# Patient Record
Sex: Male | Born: 1950 | Race: White | Hispanic: No | Marital: Single | Smoking: Current some day smoker
Health system: Southern US, Community
[De-identification: ages and names within clinical notes are randomized; demographics above are authoritative.]

## PROBLEM LIST (undated history)

## (undated) DIAGNOSIS — R002 Palpitations: Secondary | ICD-10-CM

## (undated) DIAGNOSIS — I341 Nonrheumatic mitral (valve) prolapse: Secondary | ICD-10-CM

## (undated) DIAGNOSIS — I209 Angina pectoris, unspecified: Secondary | ICD-10-CM

## (undated) DIAGNOSIS — K209 Esophagitis, unspecified without bleeding: Secondary | ICD-10-CM

## (undated) DIAGNOSIS — J189 Pneumonia, unspecified organism: Secondary | ICD-10-CM

## (undated) DIAGNOSIS — I2510 Atherosclerotic heart disease of native coronary artery without angina pectoris: Secondary | ICD-10-CM

## (undated) DIAGNOSIS — K2970 Gastritis, unspecified, without bleeding: Secondary | ICD-10-CM

## (undated) DIAGNOSIS — F319 Bipolar disorder, unspecified: Secondary | ICD-10-CM

## (undated) DIAGNOSIS — F329 Major depressive disorder, single episode, unspecified: Secondary | ICD-10-CM

## (undated) DIAGNOSIS — R569 Unspecified convulsions: Secondary | ICD-10-CM

## (undated) DIAGNOSIS — I10 Essential (primary) hypertension: Secondary | ICD-10-CM

## (undated) DIAGNOSIS — E7800 Pure hypercholesterolemia, unspecified: Secondary | ICD-10-CM

## (undated) DIAGNOSIS — E785 Hyperlipidemia, unspecified: Secondary | ICD-10-CM

## (undated) DIAGNOSIS — F419 Anxiety disorder, unspecified: Secondary | ICD-10-CM

## (undated) DIAGNOSIS — Z9289 Personal history of other medical treatment: Secondary | ICD-10-CM

## (undated) DIAGNOSIS — K219 Gastro-esophageal reflux disease without esophagitis: Secondary | ICD-10-CM

## (undated) DIAGNOSIS — G4700 Insomnia, unspecified: Secondary | ICD-10-CM

## (undated) DIAGNOSIS — F32A Depression, unspecified: Secondary | ICD-10-CM

## (undated) DIAGNOSIS — M1990 Unspecified osteoarthritis, unspecified site: Secondary | ICD-10-CM

## (undated) HISTORY — DX: Major depressive disorder, single episode, unspecified: F32.9

## (undated) HISTORY — DX: Insomnia, unspecified: G47.00

## (undated) HISTORY — PX: HIP ARTHROPLASTY: SHX981

## (undated) HISTORY — DX: Atherosclerotic heart disease of native coronary artery without angina pectoris: I25.10

## (undated) HISTORY — PX: TOTAL KNEE ARTHROPLASTY: SHX125

## (undated) HISTORY — DX: Esophagitis, unspecified without bleeding: K20.90

## (undated) HISTORY — DX: Anxiety disorder, unspecified: F41.9

## (undated) HISTORY — DX: Palpitations: R00.2

## (undated) HISTORY — DX: Nonrheumatic mitral (valve) prolapse: I34.1

## (undated) HISTORY — DX: Hyperlipidemia, unspecified: E78.5

## (undated) HISTORY — DX: Gastritis, unspecified, without bleeding: K29.70

## (undated) HISTORY — DX: Essential (primary) hypertension: I10

## (undated) HISTORY — DX: Esophagitis, unspecified: K20.9

## (undated) HISTORY — DX: Depression, unspecified: F32.A

## (undated) HISTORY — PX: KNEE ARTHROSCOPY, MEDIAL PATELLO FEMORAL LIGAMENT REPAIR: SHX1890

## (undated) HISTORY — DX: Bipolar disorder, unspecified: F31.9

## (undated) HISTORY — PX: TOTAL SHOULDER REPLACEMENT: SUR1217

## (undated) HISTORY — PX: JOINT REPLACEMENT: SHX530

## (undated) HISTORY — DX: Personal history of other medical treatment: Z92.89

---
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
## 1997-11-06 ENCOUNTER — Encounter: Admission: RE | Admit: 1997-11-06 | Discharge: 1997-11-06 | Payer: Self-pay | Admitting: Sports Medicine

## 1998-01-04 ENCOUNTER — Encounter: Admission: RE | Admit: 1998-01-04 | Discharge: 1998-01-04 | Payer: Self-pay | Admitting: Family Medicine

## 2000-07-19 ENCOUNTER — Emergency Department (HOSPITAL_COMMUNITY): Admission: EM | Admit: 2000-07-19 | Discharge: 2000-07-19 | Payer: Self-pay | Admitting: Emergency Medicine

## 2000-08-17 ENCOUNTER — Emergency Department (HOSPITAL_COMMUNITY): Admission: EM | Admit: 2000-08-17 | Discharge: 2000-08-17 | Payer: Self-pay | Admitting: Emergency Medicine

## 2000-08-17 ENCOUNTER — Encounter: Payer: Self-pay | Admitting: Internal Medicine

## 2001-07-19 ENCOUNTER — Inpatient Hospital Stay (HOSPITAL_COMMUNITY): Admission: EM | Admit: 2001-07-19 | Discharge: 2001-07-19 | Payer: Self-pay | Admitting: *Deleted

## 2001-07-19 ENCOUNTER — Encounter: Payer: Self-pay | Admitting: Cardiology

## 2001-08-08 ENCOUNTER — Encounter (INDEPENDENT_AMBULATORY_CARE_PROVIDER_SITE_OTHER): Payer: Self-pay | Admitting: *Deleted

## 2001-08-08 ENCOUNTER — Encounter: Payer: Self-pay | Admitting: Emergency Medicine

## 2001-08-08 ENCOUNTER — Inpatient Hospital Stay (HOSPITAL_COMMUNITY): Admission: EM | Admit: 2001-08-08 | Discharge: 2001-08-10 | Payer: Self-pay | Admitting: Emergency Medicine

## 2001-08-09 ENCOUNTER — Encounter: Payer: Self-pay | Admitting: Emergency Medicine

## 2001-08-13 ENCOUNTER — Encounter: Payer: Self-pay | Admitting: *Deleted

## 2001-08-13 ENCOUNTER — Emergency Department (HOSPITAL_COMMUNITY): Admission: EM | Admit: 2001-08-13 | Discharge: 2001-08-14 | Payer: Self-pay | Admitting: *Deleted

## 2001-08-14 ENCOUNTER — Emergency Department (HOSPITAL_COMMUNITY): Admission: EM | Admit: 2001-08-14 | Discharge: 2001-08-14 | Payer: Self-pay

## 2001-08-14 ENCOUNTER — Emergency Department (HOSPITAL_COMMUNITY): Admission: EM | Admit: 2001-08-14 | Discharge: 2001-08-14 | Payer: Self-pay | Admitting: Emergency Medicine

## 2001-09-08 ENCOUNTER — Emergency Department (HOSPITAL_COMMUNITY): Admission: EM | Admit: 2001-09-08 | Discharge: 2001-09-09 | Payer: Self-pay | Admitting: Emergency Medicine

## 2001-09-08 ENCOUNTER — Encounter: Payer: Self-pay | Admitting: Emergency Medicine

## 2001-09-09 ENCOUNTER — Emergency Department (HOSPITAL_COMMUNITY): Admission: EM | Admit: 2001-09-09 | Discharge: 2001-09-09 | Payer: Self-pay | Admitting: Emergency Medicine

## 2001-09-09 ENCOUNTER — Encounter: Payer: Self-pay | Admitting: Emergency Medicine

## 2001-09-10 ENCOUNTER — Inpatient Hospital Stay (HOSPITAL_COMMUNITY): Admission: EM | Admit: 2001-09-10 | Discharge: 2001-09-11 | Payer: Self-pay | Admitting: Emergency Medicine

## 2001-09-10 ENCOUNTER — Encounter: Payer: Self-pay | Admitting: Emergency Medicine

## 2001-09-24 ENCOUNTER — Emergency Department (HOSPITAL_COMMUNITY): Admission: EM | Admit: 2001-09-24 | Discharge: 2001-09-24 | Payer: Self-pay | Admitting: Emergency Medicine

## 2001-09-24 ENCOUNTER — Encounter: Payer: Self-pay | Admitting: Emergency Medicine

## 2001-11-05 ENCOUNTER — Encounter: Payer: Self-pay | Admitting: Orthopedic Surgery

## 2001-11-05 ENCOUNTER — Inpatient Hospital Stay (HOSPITAL_COMMUNITY): Admission: RE | Admit: 2001-11-05 | Discharge: 2001-11-07 | Payer: Self-pay | Admitting: Orthopedic Surgery

## 2001-11-07 ENCOUNTER — Inpatient Hospital Stay (HOSPITAL_COMMUNITY)
Admission: RE | Admit: 2001-11-07 | Discharge: 2001-11-13 | Payer: Self-pay | Admitting: Physical Medicine & Rehabilitation

## 2001-12-25 ENCOUNTER — Inpatient Hospital Stay (HOSPITAL_COMMUNITY): Admission: EM | Admit: 2001-12-25 | Discharge: 2001-12-25 | Payer: Self-pay | Admitting: Emergency Medicine

## 2001-12-25 ENCOUNTER — Encounter: Payer: Self-pay | Admitting: Orthopaedic Surgery

## 2001-12-25 ENCOUNTER — Encounter: Payer: Self-pay | Admitting: Emergency Medicine

## 2002-01-19 ENCOUNTER — Encounter: Payer: Self-pay | Admitting: Family Medicine

## 2002-01-19 ENCOUNTER — Inpatient Hospital Stay (HOSPITAL_COMMUNITY): Admission: EM | Admit: 2002-01-19 | Discharge: 2002-01-22 | Payer: Self-pay | Admitting: Emergency Medicine

## 2002-02-04 ENCOUNTER — Encounter: Payer: Self-pay | Admitting: Emergency Medicine

## 2002-02-04 ENCOUNTER — Emergency Department (HOSPITAL_COMMUNITY): Admission: EM | Admit: 2002-02-04 | Discharge: 2002-02-05 | Payer: Self-pay | Admitting: Emergency Medicine

## 2002-02-06 ENCOUNTER — Encounter: Payer: Self-pay | Admitting: Emergency Medicine

## 2002-02-06 ENCOUNTER — Inpatient Hospital Stay (HOSPITAL_COMMUNITY): Admission: EM | Admit: 2002-02-06 | Discharge: 2002-02-07 | Payer: Self-pay | Admitting: Emergency Medicine

## 2002-02-09 ENCOUNTER — Encounter: Payer: Self-pay | Admitting: Emergency Medicine

## 2002-02-09 ENCOUNTER — Emergency Department (HOSPITAL_COMMUNITY): Admission: EM | Admit: 2002-02-09 | Discharge: 2002-02-09 | Payer: Self-pay | Admitting: Emergency Medicine

## 2002-02-10 ENCOUNTER — Emergency Department (HOSPITAL_COMMUNITY): Admission: EM | Admit: 2002-02-10 | Discharge: 2002-02-10 | Payer: Self-pay | Admitting: Emergency Medicine

## 2002-02-10 ENCOUNTER — Encounter: Payer: Self-pay | Admitting: Emergency Medicine

## 2002-02-18 ENCOUNTER — Encounter: Payer: Self-pay | Admitting: Emergency Medicine

## 2002-02-18 ENCOUNTER — Emergency Department (HOSPITAL_COMMUNITY): Admission: EM | Admit: 2002-02-18 | Discharge: 2002-02-18 | Payer: Self-pay | Admitting: Emergency Medicine

## 2002-03-13 ENCOUNTER — Emergency Department (HOSPITAL_COMMUNITY): Admission: EM | Admit: 2002-03-13 | Discharge: 2002-03-14 | Payer: Self-pay | Admitting: Emergency Medicine

## 2002-03-13 ENCOUNTER — Encounter: Payer: Self-pay | Admitting: Emergency Medicine

## 2002-03-15 ENCOUNTER — Emergency Department (HOSPITAL_COMMUNITY): Admission: EM | Admit: 2002-03-15 | Discharge: 2002-03-15 | Payer: Self-pay | Admitting: Emergency Medicine

## 2002-03-16 ENCOUNTER — Emergency Department (HOSPITAL_COMMUNITY): Admission: EM | Admit: 2002-03-16 | Discharge: 2002-03-16 | Payer: Self-pay | Admitting: Emergency Medicine

## 2002-03-22 ENCOUNTER — Emergency Department (HOSPITAL_COMMUNITY): Admission: EM | Admit: 2002-03-22 | Discharge: 2002-03-22 | Payer: Self-pay | Admitting: Emergency Medicine

## 2002-05-27 ENCOUNTER — Emergency Department (HOSPITAL_COMMUNITY): Admission: EM | Admit: 2002-05-27 | Discharge: 2002-05-27 | Payer: Self-pay | Admitting: Emergency Medicine

## 2002-07-07 ENCOUNTER — Emergency Department (HOSPITAL_COMMUNITY): Admission: EM | Admit: 2002-07-07 | Discharge: 2002-07-07 | Payer: Self-pay | Admitting: Emergency Medicine

## 2002-07-07 ENCOUNTER — Encounter: Payer: Self-pay | Admitting: Emergency Medicine

## 2002-07-28 ENCOUNTER — Emergency Department (HOSPITAL_COMMUNITY): Admission: EM | Admit: 2002-07-28 | Discharge: 2002-07-28 | Payer: Self-pay | Admitting: Emergency Medicine

## 2002-07-28 ENCOUNTER — Encounter: Payer: Self-pay | Admitting: Emergency Medicine

## 2002-08-05 ENCOUNTER — Emergency Department (HOSPITAL_COMMUNITY): Admission: EM | Admit: 2002-08-05 | Discharge: 2002-08-05 | Payer: Self-pay | Admitting: Emergency Medicine

## 2002-10-19 ENCOUNTER — Emergency Department (HOSPITAL_COMMUNITY): Admission: EM | Admit: 2002-10-19 | Discharge: 2002-10-19 | Payer: Self-pay | Admitting: Emergency Medicine

## 2002-11-21 ENCOUNTER — Inpatient Hospital Stay (HOSPITAL_COMMUNITY): Admission: EM | Admit: 2002-11-21 | Discharge: 2002-12-22 | Payer: Self-pay | Admitting: Emergency Medicine

## 2002-11-23 ENCOUNTER — Encounter: Payer: Self-pay | Admitting: Nephrology

## 2002-11-25 ENCOUNTER — Encounter (INDEPENDENT_AMBULATORY_CARE_PROVIDER_SITE_OTHER): Payer: Self-pay | Admitting: Specialist

## 2002-11-26 ENCOUNTER — Encounter (INDEPENDENT_AMBULATORY_CARE_PROVIDER_SITE_OTHER): Payer: Self-pay | Admitting: *Deleted

## 2002-12-08 ENCOUNTER — Encounter: Payer: Self-pay | Admitting: Internal Medicine

## 2002-12-22 ENCOUNTER — Inpatient Hospital Stay (HOSPITAL_COMMUNITY)
Admission: RE | Admit: 2002-12-22 | Discharge: 2003-01-02 | Payer: Self-pay | Admitting: Physical Medicine & Rehabilitation

## 2002-12-25 ENCOUNTER — Encounter (INDEPENDENT_AMBULATORY_CARE_PROVIDER_SITE_OTHER): Payer: Self-pay | Admitting: Cardiology

## 2003-01-24 DIAGNOSIS — J189 Pneumonia, unspecified organism: Secondary | ICD-10-CM

## 2003-01-24 HISTORY — DX: Pneumonia, unspecified organism: J18.9

## 2003-02-05 ENCOUNTER — Emergency Department (HOSPITAL_COMMUNITY): Admission: EM | Admit: 2003-02-05 | Discharge: 2003-02-05 | Payer: Self-pay | Admitting: Emergency Medicine

## 2003-02-27 ENCOUNTER — Ambulatory Visit: Admission: RE | Admit: 2003-02-27 | Discharge: 2003-03-03 | Payer: Self-pay | Admitting: Orthopedic Surgery

## 2003-03-25 ENCOUNTER — Ambulatory Visit (HOSPITAL_COMMUNITY): Admission: RE | Admit: 2003-03-25 | Discharge: 2003-03-25 | Payer: Self-pay | Admitting: Orthopedic Surgery

## 2003-04-17 ENCOUNTER — Inpatient Hospital Stay (HOSPITAL_COMMUNITY): Admission: RE | Admit: 2003-04-17 | Discharge: 2003-04-23 | Payer: Self-pay | Admitting: Psychiatry

## 2003-05-17 ENCOUNTER — Emergency Department (HOSPITAL_COMMUNITY): Admission: EM | Admit: 2003-05-17 | Discharge: 2003-05-17 | Payer: Self-pay | Admitting: Emergency Medicine

## 2003-06-19 ENCOUNTER — Emergency Department (HOSPITAL_COMMUNITY): Admission: EM | Admit: 2003-06-19 | Discharge: 2003-06-20 | Payer: Self-pay | Admitting: Emergency Medicine

## 2003-06-29 ENCOUNTER — Emergency Department (HOSPITAL_COMMUNITY): Admission: EM | Admit: 2003-06-29 | Discharge: 2003-06-29 | Payer: Self-pay | Admitting: Emergency Medicine

## 2003-07-10 ENCOUNTER — Emergency Department (HOSPITAL_COMMUNITY): Admission: EM | Admit: 2003-07-10 | Discharge: 2003-07-11 | Payer: Self-pay | Admitting: Emergency Medicine

## 2003-07-20 ENCOUNTER — Emergency Department (HOSPITAL_COMMUNITY): Admission: EM | Admit: 2003-07-20 | Discharge: 2003-07-20 | Payer: Self-pay | Admitting: Emergency Medicine

## 2003-08-17 ENCOUNTER — Emergency Department (HOSPITAL_COMMUNITY): Admission: EM | Admit: 2003-08-17 | Discharge: 2003-08-17 | Payer: Self-pay | Admitting: Emergency Medicine

## 2003-10-27 ENCOUNTER — Emergency Department (HOSPITAL_COMMUNITY): Admission: EM | Admit: 2003-10-27 | Discharge: 2003-10-27 | Payer: Self-pay | Admitting: Emergency Medicine

## 2003-11-05 ENCOUNTER — Ambulatory Visit: Payer: Self-pay | Admitting: Psychiatry

## 2003-11-05 ENCOUNTER — Inpatient Hospital Stay (HOSPITAL_COMMUNITY): Admission: RE | Admit: 2003-11-05 | Discharge: 2003-11-09 | Payer: Self-pay | Admitting: Psychiatry

## 2003-12-03 ENCOUNTER — Emergency Department (HOSPITAL_COMMUNITY): Admission: EM | Admit: 2003-12-03 | Discharge: 2003-12-03 | Payer: Self-pay | Admitting: Emergency Medicine

## 2003-12-03 ENCOUNTER — Emergency Department (HOSPITAL_COMMUNITY): Admission: EM | Admit: 2003-12-03 | Discharge: 2003-12-04 | Payer: Self-pay | Admitting: Emergency Medicine

## 2003-12-22 ENCOUNTER — Inpatient Hospital Stay (HOSPITAL_COMMUNITY): Admission: RE | Admit: 2003-12-22 | Discharge: 2003-12-27 | Payer: Self-pay | Admitting: Psychiatry

## 2003-12-22 ENCOUNTER — Ambulatory Visit: Payer: Self-pay | Admitting: Psychiatry

## 2003-12-31 ENCOUNTER — Emergency Department (HOSPITAL_COMMUNITY): Admission: EM | Admit: 2003-12-31 | Discharge: 2003-12-31 | Payer: Self-pay | Admitting: Emergency Medicine

## 2004-02-08 ENCOUNTER — Emergency Department (HOSPITAL_COMMUNITY): Admission: EM | Admit: 2004-02-08 | Discharge: 2004-02-08 | Payer: Self-pay | Admitting: Emergency Medicine

## 2004-02-11 ENCOUNTER — Inpatient Hospital Stay (HOSPITAL_COMMUNITY): Admission: RE | Admit: 2004-02-11 | Discharge: 2004-02-18 | Payer: Self-pay | Admitting: Psychiatry

## 2004-02-11 ENCOUNTER — Ambulatory Visit: Payer: Self-pay | Admitting: Psychiatry

## 2004-02-24 ENCOUNTER — Emergency Department (HOSPITAL_COMMUNITY): Admission: EM | Admit: 2004-02-24 | Discharge: 2004-02-24 | Payer: Self-pay | Admitting: Emergency Medicine

## 2004-02-26 ENCOUNTER — Inpatient Hospital Stay (HOSPITAL_COMMUNITY): Admission: EM | Admit: 2004-02-26 | Discharge: 2004-02-29 | Payer: Self-pay | Admitting: Emergency Medicine

## 2004-03-08 ENCOUNTER — Emergency Department (HOSPITAL_COMMUNITY): Admission: EM | Admit: 2004-03-08 | Discharge: 2004-03-08 | Payer: Self-pay | Admitting: Emergency Medicine

## 2004-03-09 ENCOUNTER — Ambulatory Visit: Payer: Self-pay | Admitting: Psychiatry

## 2004-03-09 ENCOUNTER — Inpatient Hospital Stay (HOSPITAL_COMMUNITY): Admission: RE | Admit: 2004-03-09 | Discharge: 2004-03-14 | Payer: Self-pay | Admitting: Psychiatry

## 2004-03-21 ENCOUNTER — Emergency Department (HOSPITAL_COMMUNITY): Admission: EM | Admit: 2004-03-21 | Discharge: 2004-03-22 | Payer: Self-pay | Admitting: Emergency Medicine

## 2004-03-30 ENCOUNTER — Inpatient Hospital Stay (HOSPITAL_COMMUNITY): Admission: EM | Admit: 2004-03-30 | Discharge: 2004-04-06 | Payer: Self-pay | Admitting: Emergency Medicine

## 2004-04-16 ENCOUNTER — Emergency Department (HOSPITAL_COMMUNITY): Admission: EM | Admit: 2004-04-16 | Discharge: 2004-04-16 | Payer: Self-pay | Admitting: Emergency Medicine

## 2005-01-23 HISTORY — PX: CORONARY ANGIOPLASTY: SHX604

## 2005-03-08 ENCOUNTER — Inpatient Hospital Stay (HOSPITAL_COMMUNITY): Admission: RE | Admit: 2005-03-08 | Discharge: 2005-03-10 | Payer: Self-pay | Admitting: Orthopedic Surgery

## 2005-05-04 ENCOUNTER — Encounter: Admission: RE | Admit: 2005-05-04 | Discharge: 2005-06-14 | Payer: Self-pay | Admitting: Orthopedic Surgery

## 2005-08-13 ENCOUNTER — Emergency Department (HOSPITAL_COMMUNITY): Admission: EM | Admit: 2005-08-13 | Discharge: 2005-08-13 | Payer: Self-pay | Admitting: Emergency Medicine

## 2005-08-16 ENCOUNTER — Emergency Department (HOSPITAL_COMMUNITY): Admission: EM | Admit: 2005-08-16 | Discharge: 2005-08-16 | Payer: Self-pay | Admitting: Emergency Medicine

## 2005-08-29 ENCOUNTER — Inpatient Hospital Stay (HOSPITAL_COMMUNITY): Admission: EM | Admit: 2005-08-29 | Discharge: 2005-09-01 | Payer: Self-pay | Admitting: Emergency Medicine

## 2005-08-30 ENCOUNTER — Encounter: Payer: Self-pay | Admitting: Cardiology

## 2005-09-11 ENCOUNTER — Emergency Department (HOSPITAL_COMMUNITY): Admission: EM | Admit: 2005-09-11 | Discharge: 2005-09-11 | Payer: Self-pay | Admitting: Emergency Medicine

## 2005-09-22 ENCOUNTER — Inpatient Hospital Stay (HOSPITAL_COMMUNITY): Admission: RE | Admit: 2005-09-22 | Discharge: 2005-09-30 | Payer: Self-pay | Admitting: *Deleted

## 2005-09-22 ENCOUNTER — Ambulatory Visit: Payer: Self-pay | Admitting: *Deleted

## 2005-10-03 ENCOUNTER — Emergency Department (HOSPITAL_COMMUNITY): Admission: EM | Admit: 2005-10-03 | Discharge: 2005-10-04 | Payer: Self-pay | Admitting: Emergency Medicine

## 2005-10-09 ENCOUNTER — Emergency Department (HOSPITAL_COMMUNITY): Admission: EM | Admit: 2005-10-09 | Discharge: 2005-10-09 | Payer: Self-pay | Admitting: Emergency Medicine

## 2005-10-29 ENCOUNTER — Emergency Department (HOSPITAL_COMMUNITY): Admission: EM | Admit: 2005-10-29 | Discharge: 2005-10-30 | Payer: Self-pay | Admitting: Emergency Medicine

## 2005-11-08 ENCOUNTER — Inpatient Hospital Stay (HOSPITAL_COMMUNITY): Admission: EM | Admit: 2005-11-08 | Discharge: 2005-11-10 | Payer: Self-pay | Admitting: Emergency Medicine

## 2005-11-25 ENCOUNTER — Encounter: Payer: Self-pay | Admitting: *Deleted

## 2005-11-25 ENCOUNTER — Inpatient Hospital Stay (HOSPITAL_COMMUNITY): Admission: AD | Admit: 2005-11-25 | Discharge: 2005-11-27 | Payer: Self-pay | Admitting: Interventional Cardiology

## 2005-12-04 ENCOUNTER — Encounter: Payer: Self-pay | Admitting: Emergency Medicine

## 2005-12-04 ENCOUNTER — Inpatient Hospital Stay (HOSPITAL_COMMUNITY): Admission: AD | Admit: 2005-12-04 | Discharge: 2005-12-05 | Payer: Self-pay | Admitting: Cardiology

## 2005-12-10 ENCOUNTER — Inpatient Hospital Stay (HOSPITAL_COMMUNITY): Admission: AD | Admit: 2005-12-10 | Discharge: 2005-12-13 | Payer: Self-pay | Admitting: Cardiology

## 2006-01-24 ENCOUNTER — Encounter: Payer: Self-pay | Admitting: Emergency Medicine

## 2006-01-25 ENCOUNTER — Inpatient Hospital Stay (HOSPITAL_COMMUNITY): Admission: EM | Admit: 2006-01-25 | Discharge: 2006-01-26 | Payer: Self-pay | Admitting: Emergency Medicine

## 2006-02-16 ENCOUNTER — Encounter: Payer: Self-pay | Admitting: Emergency Medicine

## 2006-02-16 ENCOUNTER — Inpatient Hospital Stay (HOSPITAL_COMMUNITY): Admission: EM | Admit: 2006-02-16 | Discharge: 2006-02-19 | Payer: Self-pay | Admitting: Cardiology

## 2006-04-07 ENCOUNTER — Emergency Department (HOSPITAL_COMMUNITY): Admission: EM | Admit: 2006-04-07 | Discharge: 2006-04-07 | Payer: Self-pay | Admitting: Emergency Medicine

## 2006-06-23 ENCOUNTER — Inpatient Hospital Stay (HOSPITAL_COMMUNITY): Admission: AD | Admit: 2006-06-23 | Discharge: 2006-06-27 | Payer: Self-pay | Admitting: *Deleted

## 2006-06-23 ENCOUNTER — Ambulatory Visit: Payer: Self-pay | Admitting: *Deleted

## 2006-07-03 ENCOUNTER — Emergency Department (HOSPITAL_COMMUNITY): Admission: EM | Admit: 2006-07-03 | Discharge: 2006-07-03 | Payer: Self-pay | Admitting: Emergency Medicine

## 2006-07-16 ENCOUNTER — Emergency Department (HOSPITAL_COMMUNITY): Admission: EM | Admit: 2006-07-16 | Discharge: 2006-07-16 | Payer: Self-pay | Admitting: Emergency Medicine

## 2006-07-18 ENCOUNTER — Emergency Department (HOSPITAL_COMMUNITY): Admission: EM | Admit: 2006-07-18 | Discharge: 2006-07-18 | Payer: Self-pay | Admitting: Emergency Medicine

## 2006-07-19 ENCOUNTER — Emergency Department (HOSPITAL_COMMUNITY): Admission: EM | Admit: 2006-07-19 | Discharge: 2006-07-19 | Payer: Self-pay | Admitting: Emergency Medicine

## 2006-07-20 ENCOUNTER — Emergency Department (HOSPITAL_COMMUNITY): Admission: EM | Admit: 2006-07-20 | Discharge: 2006-07-20 | Payer: Self-pay | Admitting: Emergency Medicine

## 2006-09-27 ENCOUNTER — Emergency Department (HOSPITAL_COMMUNITY): Admission: EM | Admit: 2006-09-27 | Discharge: 2006-09-27 | Payer: Self-pay | Admitting: Emergency Medicine

## 2006-09-29 ENCOUNTER — Inpatient Hospital Stay (HOSPITAL_COMMUNITY): Admission: AD | Admit: 2006-09-29 | Discharge: 2006-10-04 | Payer: Self-pay | Admitting: Psychiatry

## 2006-09-29 ENCOUNTER — Encounter: Payer: Self-pay | Admitting: Emergency Medicine

## 2006-10-01 ENCOUNTER — Ambulatory Visit: Payer: Self-pay | Admitting: Psychiatry

## 2006-10-19 ENCOUNTER — Inpatient Hospital Stay (HOSPITAL_COMMUNITY): Admission: EM | Admit: 2006-10-19 | Discharge: 2006-10-23 | Payer: Self-pay | Admitting: Emergency Medicine

## 2006-11-16 ENCOUNTER — Emergency Department (HOSPITAL_COMMUNITY): Admission: EM | Admit: 2006-11-16 | Discharge: 2006-11-16 | Payer: Self-pay | Admitting: Emergency Medicine

## 2006-12-05 ENCOUNTER — Encounter: Payer: Self-pay | Admitting: Emergency Medicine

## 2006-12-06 ENCOUNTER — Inpatient Hospital Stay (HOSPITAL_COMMUNITY): Admission: AD | Admit: 2006-12-06 | Discharge: 2006-12-10 | Payer: Self-pay | Admitting: Psychiatry

## 2006-12-06 ENCOUNTER — Ambulatory Visit: Payer: Self-pay | Admitting: Psychiatry

## 2006-12-06 ENCOUNTER — Encounter: Payer: Self-pay | Admitting: Emergency Medicine

## 2006-12-25 ENCOUNTER — Ambulatory Visit: Admission: RE | Admit: 2006-12-25 | Discharge: 2006-12-25 | Payer: Self-pay | Admitting: Orthopedic Surgery

## 2007-02-04 ENCOUNTER — Inpatient Hospital Stay (HOSPITAL_COMMUNITY): Admission: RE | Admit: 2007-02-04 | Discharge: 2007-02-08 | Payer: Self-pay | Admitting: Orthopedic Surgery

## 2007-12-13 ENCOUNTER — Encounter: Payer: Self-pay | Admitting: Emergency Medicine

## 2007-12-14 ENCOUNTER — Ambulatory Visit: Payer: Self-pay | Admitting: *Deleted

## 2007-12-14 ENCOUNTER — Inpatient Hospital Stay (HOSPITAL_COMMUNITY): Admission: EM | Admit: 2007-12-14 | Discharge: 2007-12-17 | Payer: Self-pay | Admitting: *Deleted

## 2008-04-23 HISTORY — PX: CARDIAC CATHETERIZATION: SHX172

## 2008-04-25 ENCOUNTER — Inpatient Hospital Stay (HOSPITAL_COMMUNITY): Admission: EM | Admit: 2008-04-25 | Discharge: 2008-04-27 | Payer: Self-pay | Admitting: Emergency Medicine

## 2008-07-20 ENCOUNTER — Ambulatory Visit: Payer: Self-pay | Admitting: Cardiology

## 2008-07-20 ENCOUNTER — Inpatient Hospital Stay (HOSPITAL_COMMUNITY): Admission: EM | Admit: 2008-07-20 | Discharge: 2008-07-22 | Payer: Self-pay | Admitting: Emergency Medicine

## 2008-08-31 ENCOUNTER — Inpatient Hospital Stay (HOSPITAL_COMMUNITY): Admission: AD | Admit: 2008-08-31 | Discharge: 2008-09-07 | Payer: Self-pay | Admitting: Psychiatry

## 2008-08-31 ENCOUNTER — Ambulatory Visit: Payer: Self-pay | Admitting: Psychiatry

## 2008-08-31 ENCOUNTER — Encounter: Payer: Self-pay | Admitting: Emergency Medicine

## 2009-01-01 ENCOUNTER — Encounter: Payer: Self-pay | Admitting: Emergency Medicine

## 2009-01-02 ENCOUNTER — Ambulatory Visit: Payer: Self-pay | Admitting: Psychiatry

## 2009-01-02 ENCOUNTER — Inpatient Hospital Stay (HOSPITAL_COMMUNITY): Admission: AD | Admit: 2009-01-02 | Discharge: 2009-01-07 | Payer: Self-pay | Admitting: Psychiatry

## 2009-08-12 ENCOUNTER — Inpatient Hospital Stay (HOSPITAL_COMMUNITY)
Admission: EM | Admit: 2009-08-12 | Discharge: 2009-08-14 | Payer: Self-pay | Source: Home / Self Care | Admitting: Emergency Medicine

## 2009-08-12 ENCOUNTER — Ambulatory Visit: Payer: Self-pay | Admitting: Internal Medicine

## 2009-08-16 ENCOUNTER — Observation Stay (HOSPITAL_COMMUNITY): Admission: EM | Admit: 2009-08-16 | Discharge: 2009-08-17 | Payer: Self-pay | Admitting: Emergency Medicine

## 2009-08-16 ENCOUNTER — Encounter (INDEPENDENT_AMBULATORY_CARE_PROVIDER_SITE_OTHER): Payer: Self-pay | Admitting: Internal Medicine

## 2009-10-10 ENCOUNTER — Ambulatory Visit: Payer: Self-pay | Admitting: Psychiatry

## 2009-10-10 ENCOUNTER — Inpatient Hospital Stay (HOSPITAL_COMMUNITY): Admission: RE | Admit: 2009-10-10 | Discharge: 2009-10-15 | Payer: Self-pay | Admitting: Psychiatry

## 2009-10-10 ENCOUNTER — Encounter: Payer: Self-pay | Admitting: Emergency Medicine

## 2010-02-12 ENCOUNTER — Encounter: Payer: Self-pay | Admitting: *Deleted

## 2010-02-13 ENCOUNTER — Encounter: Payer: Self-pay | Admitting: Orthopedic Surgery

## 2010-04-07 LAB — BASIC METABOLIC PANEL
BUN: 10 mg/dL (ref 6–23)
Chloride: 101 mEq/L (ref 96–112)
Creatinine, Ser: 0.97 mg/dL (ref 0.4–1.5)

## 2010-04-07 LAB — DIFFERENTIAL
Basophils Absolute: 0 10*3/uL (ref 0.0–0.1)
Basophils Relative: 0 % (ref 0–1)
Eosinophils Absolute: 0.1 10*3/uL (ref 0.0–0.7)
Eosinophils Relative: 2 % (ref 0–5)
Lymphocytes Relative: 25 % (ref 12–46)

## 2010-04-07 LAB — HEPATIC FUNCTION PANEL
ALT: 78 U/L — ABNORMAL HIGH (ref 0–53)
AST: 75 U/L — ABNORMAL HIGH (ref 0–37)
Albumin: 4.8 g/dL (ref 3.5–5.2)
Alkaline Phosphatase: 73 U/L (ref 39–117)
Total Protein: 7.9 g/dL (ref 6.0–8.3)

## 2010-04-07 LAB — RAPID URINE DRUG SCREEN, HOSP PERFORMED
Barbiturates: NOT DETECTED
Benzodiazepines: NOT DETECTED

## 2010-04-07 LAB — ETHANOL: Alcohol, Ethyl (B): <5 mg/dL (ref 0–10)

## 2010-04-07 LAB — CBC
MCV: 88.8 fL (ref 78.0–100.0)
Platelets: 274 10*3/uL (ref 150–400)
RDW: 16.1 % — ABNORMAL HIGH (ref 11.5–15.5)
WBC: 7.6 10*3/uL (ref 4.0–10.5)

## 2010-04-09 LAB — POCT I-STAT, CHEM 8
BUN: <3 mg/dL — ABNORMAL LOW (ref 6–23)
BUN: <3 mg/dL — ABNORMAL LOW (ref 6–23)
Calcium, Ion: 0.99 mmol/L — ABNORMAL LOW (ref 1.12–1.32)
Chloride: 103 mEq/L (ref 96–112)
Creatinine, Ser: 1 mg/dL (ref 0.4–1.5)
Creatinine, Ser: 1.1 mg/dL (ref 0.4–1.5)
Glucose, Bld: 73 mg/dL (ref 70–99)
Glucose, Bld: 76 mg/dL (ref 70–99)
Hemoglobin: 15.3 g/dL (ref 13.0–17.0)
Potassium: 3.8 mEq/L (ref 3.5–5.1)
TCO2: 18 mmol/L (ref 0–100)

## 2010-04-09 LAB — DIFFERENTIAL
Basophils Absolute: 0 10*3/uL (ref 0.0–0.1)
Basophils Relative: 0 % (ref 0–1)
Eosinophils Absolute: 0.1 10*3/uL (ref 0.0–0.7)
Eosinophils Relative: 1 % (ref 0–5)
Lymphocytes Relative: 30 % (ref 12–46)
Lymphs Abs: 3.5 10*3/uL (ref 0.7–4.0)
Monocytes Absolute: 0.5 10*3/uL (ref 0.1–1.0)
Monocytes Relative: 4 % (ref 3–12)
Neutro Abs: 4.8 10*3/uL (ref 1.7–7.7)
Neutrophils Relative %: 59 % (ref 43–77)

## 2010-04-09 LAB — POCT CARDIAC MARKERS
CKMB, poc: 1.4 ng/mL (ref 1.0–8.0)
Myoglobin, poc: 110 ng/mL (ref 12–200)
Myoglobin, poc: 178 ng/mL (ref 12–200)
Troponin i, poc: <0.05 ng/mL (ref 0.00–0.09)

## 2010-04-09 LAB — BRAIN NATRIURETIC PEPTIDE: Pro B Natriuretic peptide (BNP): 299 pg/mL — ABNORMAL HIGH (ref 0.0–100.0)

## 2010-04-09 LAB — CBC
HCT: 40.1 % (ref 39.0–52.0)
HCT: 40.8 % (ref 39.0–52.0)
HCT: 40.9 % (ref 39.0–52.0)
Hemoglobin: 13.6 g/dL (ref 13.0–17.0)
Hemoglobin: 13.6 g/dL (ref 13.0–17.0)
MCH: 30 pg (ref 26.0–34.0)
MCHC: 33.3 g/dL (ref 30.0–36.0)
MCV: 88.6 fL (ref 78.0–100.0)
MCV: 88.7 fL (ref 78.0–100.0)
Platelets: 229 10*3/uL (ref 150–400)
RBC: 4.53 MIL/uL (ref 4.22–5.81)
RBC: 4.6 MIL/uL (ref 4.22–5.81)
RBC: 4.61 MIL/uL (ref 4.22–5.81)
WBC: 11.8 10*3/uL — ABNORMAL HIGH (ref 4.0–10.5)
WBC: 8.1 10*3/uL (ref 4.0–10.5)

## 2010-04-09 LAB — COMPREHENSIVE METABOLIC PANEL
Alkaline Phosphatase: 81 U/L (ref 39–117)
BUN: 8 mg/dL (ref 6–23)
Chloride: 99 mEq/L (ref 96–112)
GFR calc non Af Amer: >60 mL/min (ref >60)
Glucose, Bld: 108 mg/dL — ABNORMAL HIGH (ref 70–99)
Potassium: 3.8 mEq/L (ref 3.5–5.1)
Total Bilirubin: 0.7 mg/dL (ref 0.3–1.2)

## 2010-04-09 LAB — CARDIAC PANEL(CRET KIN+CKTOT+MB+TROPI)
CK, MB: 1.8 ng/mL (ref 0.3–4.0)
CK, MB: 2 ng/mL (ref 0.3–4.0)
Relative Index: 0.7 (ref 0.0–2.5)
Relative Index: 1 (ref 0.0–2.5)
Relative Index: 1 (ref 0.0–2.5)
Total CK: 189 U/L (ref 7–232)
Total CK: 296 U/L — ABNORMAL HIGH (ref 7–232)
Troponin I: 0.01 ng/mL (ref 0.00–0.06)
Troponin I: 0.01 ng/mL (ref 0.00–0.06)

## 2010-04-09 LAB — BASIC METABOLIC PANEL
CO2: 26 mEq/L (ref 19–32)
GFR calc Af Amer: >60 mL/min (ref >60)
GFR calc non Af Amer: >60 mL/min (ref >60)
Glucose, Bld: 102 mg/dL — ABNORMAL HIGH (ref 70–99)
Potassium: 3.8 mEq/L (ref 3.5–5.1)
Sodium: 138 mEq/L (ref 135–145)

## 2010-04-09 LAB — CK TOTAL AND CKMB (NOT AT ARMC)
CK, MB: 2.9 ng/mL (ref 0.3–4.0)
Total CK: 372 U/L — ABNORMAL HIGH (ref 7–232)

## 2010-04-09 LAB — FOLATE: Folate: >20 ng/mL

## 2010-04-09 LAB — LIPID PANEL
Total CHOL/HDL Ratio: 1.9 RATIO
VLDL: 13 mg/dL (ref 0–40)

## 2010-04-09 LAB — VITAMIN B12: Vitamin B-12: 580 pg/mL (ref 211–911)

## 2010-04-09 LAB — RPR: RPR Ser Ql: NONREACTIVE

## 2010-04-26 LAB — URINALYSIS, ROUTINE W REFLEX MICROSCOPIC
Bilirubin Urine: NEGATIVE
Hgb urine dipstick: NEGATIVE
Ketones, ur: NEGATIVE mg/dL
Protein, ur: NEGATIVE mg/dL
Urobilinogen, UA: 1 mg/dL (ref 0.0–1.0)

## 2010-04-26 LAB — DIFFERENTIAL
Basophils Relative: 1 % (ref 0–1)
Eosinophils Absolute: 0.1 10*3/uL (ref 0.0–0.7)
Monocytes Relative: 10 % (ref 3–12)
Neutrophils Relative %: 57 % (ref 43–77)

## 2010-04-26 LAB — RAPID URINE DRUG SCREEN, HOSP PERFORMED
Amphetamines: NOT DETECTED
Barbiturates: NOT DETECTED
Tetrahydrocannabinol: NOT DETECTED

## 2010-04-26 LAB — GLUCOSE, CAPILLARY

## 2010-04-26 LAB — POCT I-STAT, CHEM 8
Calcium, Ion: 1.09 mmol/L — ABNORMAL LOW (ref 1.12–1.32)
Creatinine, Ser: 1 mg/dL (ref 0.4–1.5)
Hemoglobin: 15 g/dL (ref 13.0–17.0)
Sodium: 138 mEq/L (ref 135–145)
TCO2: 23 mmol/L (ref 0–100)

## 2010-04-26 LAB — CBC
MCHC: 32.2 g/dL (ref 30.0–36.0)
MCV: 89.1 fL (ref 78.0–100.0)
Platelets: 253 10*3/uL (ref 150–400)
RBC: 4.47 MIL/uL (ref 4.22–5.81)

## 2010-04-30 LAB — GLUCOSE, CAPILLARY
Glucose-Capillary: 109 mg/dL — ABNORMAL HIGH (ref 70–99)
Glucose-Capillary: 114 mg/dL — ABNORMAL HIGH (ref 70–99)

## 2010-04-30 LAB — BASIC METABOLIC PANEL
BUN: 15 mg/dL (ref 6–23)
Chloride: 98 mEq/L (ref 96–112)
GFR calc non Af Amer: >60 mL/min (ref >60)
Potassium: 4.3 mEq/L (ref 3.5–5.1)
Sodium: 129 mEq/L — ABNORMAL LOW (ref 135–145)

## 2010-04-30 LAB — DIFFERENTIAL
Lymphs Abs: 3.9 10*3/uL (ref 0.7–4.0)
Monocytes Relative: 6 % (ref 3–12)
Neutro Abs: 7.9 10*3/uL — ABNORMAL HIGH (ref 1.7–7.7)
Neutrophils Relative %: 63 % (ref 43–77)

## 2010-04-30 LAB — ETHANOL
Alcohol, Ethyl (B): 203 mg/dL — ABNORMAL HIGH (ref 0–10)
Alcohol, Ethyl (B): 57 mg/dL — ABNORMAL HIGH (ref 0–10)

## 2010-04-30 LAB — CBC
Platelets: 320 10*3/uL (ref 150–400)
RBC: 5.76 MIL/uL (ref 4.22–5.81)
WBC: 12.7 10*3/uL — ABNORMAL HIGH (ref 4.0–10.5)

## 2010-04-30 LAB — POCT I-STAT, CHEM 8
Chloride: 96 mEq/L (ref 96–112)
Creatinine, Ser: 0.9 mg/dL (ref 0.4–1.5)
Glucose, Bld: 91 mg/dL (ref 70–99)
HCT: 53 % — ABNORMAL HIGH (ref 39.0–52.0)
Potassium: 4.6 mEq/L (ref 3.5–5.1)
Sodium: 124 mEq/L — ABNORMAL LOW (ref 135–145)

## 2010-04-30 LAB — POCT CARDIAC MARKERS
CKMB, poc: <1 ng/mL — ABNORMAL LOW (ref 1.0–8.0)
Myoglobin, poc: 84.4 ng/mL (ref 12–200)
Troponin i, poc: <0.05 ng/mL (ref 0.00–0.09)

## 2010-04-30 LAB — RAPID URINE DRUG SCREEN, HOSP PERFORMED
Cocaine: NOT DETECTED
Opiates: NOT DETECTED
Tetrahydrocannabinol: NOT DETECTED

## 2010-05-02 LAB — CBC
HCT: 36.8 % — ABNORMAL LOW (ref 39.0–52.0)
Hemoglobin: 12.3 g/dL — ABNORMAL LOW (ref 13.0–17.0)
MCHC: 33.1 g/dL (ref 30.0–36.0)
MCHC: 33.3 g/dL (ref 30.0–36.0)
MCV: 83.1 fL (ref 78.0–100.0)
Platelets: 202 10*3/uL (ref 150–400)
Platelets: 220 10*3/uL (ref 150–400)
RDW: 16.6 % — ABNORMAL HIGH (ref 11.5–15.5)
RDW: 16.9 % — ABNORMAL HIGH (ref 11.5–15.5)

## 2010-05-02 LAB — COMPREHENSIVE METABOLIC PANEL
Albumin: 3.7 g/dL (ref 3.5–5.2)
BUN: 7 mg/dL (ref 6–23)
Calcium: 8.9 mg/dL (ref 8.4–10.5)
Creatinine, Ser: 0.92 mg/dL (ref 0.4–1.5)
Glucose, Bld: 82 mg/dL (ref 70–99)
Potassium: 4.2 mEq/L (ref 3.5–5.1)
Total Protein: 6.8 g/dL (ref 6.0–8.3)

## 2010-05-02 LAB — URINALYSIS, ROUTINE W REFLEX MICROSCOPIC
Bilirubin Urine: NEGATIVE
Nitrite: NEGATIVE
Specific Gravity, Urine: 1.005 (ref 1.005–1.030)
Urobilinogen, UA: 0.2 mg/dL (ref 0.0–1.0)
pH: 6 (ref 5.0–8.0)

## 2010-05-02 LAB — CARDIAC PANEL(CRET KIN+CKTOT+MB+TROPI)
Relative Index: 1 (ref 0.0–2.5)
Troponin I: 0.02 ng/mL (ref 0.00–0.06)

## 2010-05-02 LAB — BASIC METABOLIC PANEL
BUN: 7 mg/dL (ref 6–23)
CO2: 21 mEq/L (ref 19–32)
Calcium: 9.2 mg/dL (ref 8.4–10.5)
Creatinine, Ser: 0.86 mg/dL (ref 0.4–1.5)
GFR calc non Af Amer: >60 mL/min (ref >60)
Glucose, Bld: 71 mg/dL (ref 70–99)
Sodium: 137 mEq/L (ref 135–145)

## 2010-05-02 LAB — RAPID URINE DRUG SCREEN, HOSP PERFORMED
Barbiturates: NOT DETECTED
Benzodiazepines: NOT DETECTED

## 2010-05-02 LAB — DIFFERENTIAL
Basophils Absolute: 0.1 10*3/uL (ref 0.0–0.1)
Basophils Relative: 1 % (ref 0–1)
Basophils Relative: 1 % (ref 0–1)
Lymphocytes Relative: 24 % (ref 12–46)
Lymphocytes Relative: 31 % (ref 12–46)
Lymphs Abs: 2.2 10*3/uL (ref 0.7–4.0)
Monocytes Absolute: 0.6 10*3/uL (ref 0.1–1.0)
Monocytes Absolute: 0.9 10*3/uL (ref 0.1–1.0)
Monocytes Relative: 9 % (ref 3–12)
Neutro Abs: 3.9 10*3/uL (ref 1.7–7.7)
Neutro Abs: 5.6 10*3/uL (ref 1.7–7.7)
Neutrophils Relative %: 55 % (ref 43–77)
Neutrophils Relative %: 61 % (ref 43–77)

## 2010-05-02 LAB — POCT CARDIAC MARKERS
Myoglobin, poc: 70.5 ng/mL (ref 12–200)
Troponin i, poc: <0.05 ng/mL (ref 0.00–0.09)

## 2010-05-02 LAB — APTT: aPTT: 36 seconds (ref 24–37)

## 2010-05-02 LAB — D-DIMER, QUANTITATIVE: D-Dimer, Quant: 0.52 ug/mL-FEU — ABNORMAL HIGH (ref 0.00–0.48)

## 2010-05-02 LAB — PROTIME-INR: INR: 1.1 (ref 0.00–1.49)

## 2010-05-02 LAB — GLUCOSE, CAPILLARY

## 2010-05-02 LAB — TSH: TSH: 1.268 u[IU]/mL (ref 0.350–4.500)

## 2010-05-04 LAB — COMPREHENSIVE METABOLIC PANEL
AST: 33 U/L (ref 0–37)
Albumin: 4.4 g/dL (ref 3.5–5.2)
Alkaline Phosphatase: 79 U/L (ref 39–117)
CO2: 19 mEq/L (ref 19–32)
Chloride: 103 mEq/L (ref 96–112)
GFR calc Af Amer: >60 mL/min (ref >60)
GFR calc non Af Amer: >60 mL/min (ref >60)
Potassium: 3.7 mEq/L (ref 3.5–5.1)
Total Bilirubin: 0.8 mg/dL (ref 0.3–1.2)

## 2010-05-04 LAB — URINALYSIS, ROUTINE W REFLEX MICROSCOPIC
Bilirubin Urine: NEGATIVE
Glucose, UA: NEGATIVE mg/dL
Hgb urine dipstick: NEGATIVE
Ketones, ur: 15 mg/dL — AB
Protein, ur: NEGATIVE mg/dL
Urobilinogen, UA: 0.2 mg/dL (ref 0.0–1.0)

## 2010-05-04 LAB — DIFFERENTIAL
Basophils Absolute: 0.1 10*3/uL (ref 0.0–0.1)
Basophils Relative: 1 % (ref 0–1)
Eosinophils Absolute: 0.2 10*3/uL (ref 0.0–0.7)
Eosinophils Relative: 2 % (ref 0–5)
Monocytes Absolute: 0.9 10*3/uL (ref 0.1–1.0)

## 2010-05-04 LAB — CK TOTAL AND CKMB (NOT AT ARMC): Total CK: 395 U/L — ABNORMAL HIGH (ref 7–232)

## 2010-05-04 LAB — CARDIAC PANEL(CRET KIN+CKTOT+MB+TROPI)
CK, MB: 1.7 ng/mL (ref 0.3–4.0)
Relative Index: 0.9 (ref 0.0–2.5)
Total CK: 265 U/L — ABNORMAL HIGH (ref 7–232)
Troponin I: <0.01 ng/mL (ref 0.00–0.06)
Troponin I: <0.01 ng/mL (ref 0.00–0.06)

## 2010-05-04 LAB — CK: Total CK: 412 U/L — ABNORMAL HIGH (ref 7–232)

## 2010-05-04 LAB — CBC
Hemoglobin: 13.5 g/dL (ref 13.0–17.0)
Hemoglobin: 13.5 g/dL (ref 13.0–17.0)
MCHC: 33.5 g/dL (ref 30.0–36.0)
MCHC: 33.6 g/dL (ref 30.0–36.0)
MCV: 84 fL (ref 78.0–100.0)
RBC: 4.81 MIL/uL (ref 4.22–5.81)
RDW: 17.6 % — ABNORMAL HIGH (ref 11.5–15.5)
WBC: 8.5 10*3/uL (ref 4.0–10.5)

## 2010-05-04 LAB — LIPASE, BLOOD: Lipase: 96 U/L — ABNORMAL HIGH (ref 11–59)

## 2010-05-04 LAB — LIPID PANEL
Cholesterol: 184 mg/dL (ref 0–200)
HDL: 57 mg/dL (ref >39)
LDL Cholesterol: 106 mg/dL — ABNORMAL HIGH (ref 0–99)
Triglycerides: 105 mg/dL (ref <150)

## 2010-05-04 LAB — AMYLASE: Amylase: 40 U/L (ref 27–131)

## 2010-05-04 LAB — HEPARIN LEVEL (UNFRACTIONATED)
Heparin Unfractionated: 0.39 IU/mL (ref 0.30–0.70)
Heparin Unfractionated: 0.61 IU/mL (ref 0.30–0.70)

## 2010-05-04 LAB — PROTIME-INR: INR: 1 (ref 0.00–1.49)

## 2010-06-07 NOTE — Op Note (Signed)
NAME:  [name], [name]                   ACCOUNT NO.:  [id number]   MEDICAL RECORD NO.:  [id number]          PATIENT TYPE:  INP   LOCATION:  4732                         FACILITY:  [facility]   PHYSICIAN:  Anselmo Rod, M.D.  DATE OF BIRTH:  11/26/1950   DATE OF PROCEDURE:  10/20/2006  DATE OF DISCHARGE:  10/23/2006                               OPERATIVE REPORT   PROCEDURE PERFORMED:  Esophagogastroduodenoscopy.   ENDOSCOPIST:  Anselmo Rod, MD   INSTRUMENT USED:  Pentax video panendoscope.   INDICATIONS FOR PROCEDURE:  A 60 year old white male with a history of  alcohol and tobacco abuse, undergoing EGD for melenic stools and anemia,  rule out peptic ulcer disease, esophagitis, gastritis, etc.   PREPROCEDURE PREPARATION:  Informed consent was procured from the  patient.  The patient was fasted for 4 hours prior to the procedure.  The risks and benefits of the procedure were discussed with the patient  in great detail.   PREPROCEDURE PHYSICAL:  VITAL SIGNS:  The patient had stable vital  signs.  NECK:  Supple.  CHEST:  Clear to auscultation.  S1 and S2 regular.  ABDOMEN:  Soft with normal bowel sounds.   DESCRIPTION OF PROCEDURE:  The patient was placed in the left lateral  decubitus position and sedated with 35 mcg of Fentanyl and 4 mg of  Versed given intravenously in slow incremental doses.  Once the patient  was adequately sedated and maintained on low-flow oxygen and continuous  cardiac monitoring, the Pentax video panendoscope was advanced through  the mouthpiece, over the tongue and into the esophagus under direct  vision.  Severe esophagitis was noted in the mid and distal esophagus. A  3-4cm hiatal hernia was seen on high retroflexion in the stomach.  Mild  antral gastritis was appreciated.  The proximal small bowel appeared  normal.  There was no outlet obstruction.  The patient tolerated the  procedure well and without complications.   IMPRESSION:  1. Grade 2-3  distal esophagitis from the mid to distal esophagus.  2. A 3-4cm hiatal hernia.  3. Mild antral gastritis.  4. Normal proximal small bowel.   RECOMMENDATIONS:  1. Continue serial CBCs.  2. Continue PPIs.  3. Carafate suspension 1 gm QID has been advised and the patient is to      elevate the head of the bed by 45 degrees.  4. Protonix will be changed to a b.i.d. regimen for now.  5. Low-residue bland diet has been recommended.  6. The patient should be advised against the use of alcohol and      tobacco.  7. Further recommendations will be made in followup.      Anselmo Rod, M.D.  Electronically Signed     JNM/MEDQ  D:  10/31/2006  T:  11/01/2006  Job:  [id number]

## 2010-06-07 NOTE — H&P (Signed)
NAME:  [name], [name]                   ACCOUNT NO.:  [id number]   MEDICAL RECORD NO.:  [id number]          PATIENT TYPE:  IPS   LOCATION:  0503                          [facility]   PHYSICIAN:  Geoffery Lyons, M.D.      DATE OF BIRTH:  13-Dec-1950   DATE OF ADMISSION:  08/31/2008  DATE OF DISCHARGE:                       PSYCHIATRIC ADMISSION ASSESSMENT   This is a 60 year old male, voluntarily admitted August 31, 2008.   HISTORY OF PRESENT ILLNESS:  The patient is here for alcohol detox,  relapsed about 4 days ago after a breakup with a relationship.  He has a  long history of alcohol use, was sober for approximately 2 years.  Denies any suicidal thoughts.  Denies any other substance use.   PAST PSYCHIATRIC HISTORY:  The patient has been here before.  His last  admission was in December 2009.  The patient goes to [facility] for outpatient mental health services.   SOCIAL HISTORY:  He is a 60 year old, single male.  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   FAMILY HISTORY:  None.   ALCOHOL/DRUG HISTORY:  Again as above, has been drinking, relapsed about  4 days ago.  Has a long history of alcohol use with a 2-year period of  sobriety.   PRIMARY CARE Harmani Neto:  Listed is Dr. Swaziland.  The patient did have a  recent hospital stay for chest pain.   MEDICAL PROBLEMS:  History of hypertension and atypical chest pain.   MEDICATIONS:  1. Diltiazem SR 120 daily.  2. Nitroglycerin as needed for chest pain.  3. Crestor 20 mg daily.  4. Wellbutrin 300 mg daily.  5. Metoprolol 50 mg b.i.d.  6. Aspirin 81 mg daily.  7. Seroquel 400 mg q.h.s.  8. Xanax 0.5 mg q.8 h p.r.n.   DRUG ALLERGIES:  CODEINE.   PHYSICAL EXAM:  This is a middle-aged male, assessed at [facility]  Emergency Department. physical exam was reviewed with no significant  findings.   LABORATORY DATA:  Shows a WBC count of 12.7.  Alcohol level was 203.  Hyponatremic with a sodium of 124.  Urine  drug screen was negative.  Alcohol level did come down to 57 at time of transfer.   The patient did develop some chest pain and tachycardia after arrival.  A medical workup was done.  Chest x-ray was negative.   MENTAL STATUS EXAM:  The patient is a fully alert, cooperative, somewhat  disheveled.  Speech is clear, normal pace and tone.  He is somewhat  irritable, grumbling that he did not get his lunch on time and that he  is having some difficulty obtaining fluids on the unit.  Thought  processes are coherent, goal-directed.  No suicidal thoughts.  Cognitive  function is intact.  Memory is good.  Judgment and insight appear to be  good.   AXIS I:  Bipolar disorder.  Alcohol dependence.  AXIS II:  Deferred.  AXIS III:  Hypertension, atypical chest pain.  AXIS IV:  Deferred this time.  AXIS V:  Current is 35-40.  PLAN:  Our plan is to detox the patient with a Librium protocol, work on  relapse pre4vention.  We will continue to assess comorbidities.  We will  continue the patient's medications at this time, although we will  discontinue the Xanax.  We will offer fluids.  We will repeat his lab  work to evaluate his sodium level.  We will offer Gatorade as needed.  His tentative length of stay at this time is 3-5 days.      Landry Corporal, N.P.      Geoffery Lyons, M.D.  Electronically Signed    JO/MEDQ  D:  09/02/2008  T:  09/02/2008  Job:  [id number]

## 2010-06-07 NOTE — H&P (Signed)
NAME:  [name], [name]                   ACCOUNT NO.:  [id number]   MEDICAL RECORD NO.:  [id number]          PATIENT TYPE:  IPS   LOCATION:  0501                          [facility]   PHYSICIAN:  Geoffery Lyons, M.D.      DATE OF BIRTH:  08/26/1950   DATE OF ADMISSION:  12/06/2006  DATE OF DISCHARGE:                       PSYCHIATRIC ADMISSION ASSESSMENT   IDENTIFICATION:  A 60 year old white male, single.  This is a voluntary  admission.   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  Heavy alcohol use over the  course of the past week drinking 10-12 beers or 10-12 glasses of wine  daily for the past week.  Alcohol level was 133 in the emergency room  and he was positive for benzodiazepines in his urine, however he denies  any abuse of benzodiazepines.  He reports that he has been compliant  taking his medications prescribed for his bipolar disorder by Dr.  Hortencia Pilar at [facility] mental health where he was last seen on  October 27.  He is unable to specify any particular triggers for his  relapse but says he has been drinking for several weeks.  He initially  presented in the emergency room with some chest tightness and he does  have a history of coronary artery disease with prior PTCA in 2007.  His  workup in the emergency room was negative for any acute process.  He  denies any suicidal thoughts.  Reports his mood is stable on his current  medications which he is requesting to resume.   PAST PSYCHIATRIC HISTORY:  The patient is currently followed by Dr.  Hortencia Pilar at [facility] mental health, last seen there November 19, 2006.  This is one of several admissions here to [facility] with his last  admission September 6 to the 11th, 2008 also for alcohol dependence.  He  also has a history of bipolar disorder and has been stable for some time  on Wellbutrin 200 mg every morning and Seroquel XR 400 mg q.h.s.  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  He does have a current DWI charge  from approximately 3 months ago with a court date scheduled for next  month.  He is also an Multimedia programmer, previously married now  separated for 2 years.  No children.  He is currently living with a  friend.   FAMILY HISTORY:  Remarkable for at least one brother with history of  alcohol abuse and dependence.   MEDICAL HISTORY:  The patient is followed by Dr. Loreta Ave, his  gastroenterologist here in [place].  Medical problems include history  of mitral valve prolapse.  He currently has some left hip pain from  arthritis and is in  the process of being considered for left hip  replacement.  He most recently in September was hospitalized for  esophagitis with an upper GI bleed.   PAST MEDICAL HISTORY:  Is remarkable for history of CAD with  percutaneous stenting in 2007, history of osteoporosis right knee, right  hip and right shoulder replacement surgeries.   CURRENT MEDICATIONS:  Are Seroquel XR 400 mg p.o. q.h.s., Protonix 40 mg  daily, Wellbutrin SR 200 mg p.o. q.a.m.   DRUG ALLERGIES:  Are CODEINE.   POSITIVE PHYSICAL FINDINGS:  5 feet 10 inches tall, 178 pounds,  temperature 98.6, pulse 116, respirations 20, blood pressure 123/88.  These were his vital signs at the time of admission to our unit.  CIWA  score on admission to the unit was twelve.   DIAGNOSTIC STUDIES:  Were remarkable for an alcohol level of 133 mg  percent.  His CBC was unremarkable, platelets 36,000, RDW 18.9, MCV  81.5.  His SGOT was mildly elevated at 40 with a normal SGPT of 15.  Urine drug screen positive for benzodiazepines though he denies that he  takes  these.   MENTAL STATUS EXAM:  Fully alert male, pleasant cooperative with a sense  of humor this morning, is unable to recall exactly how long he has been  drinking or how much he has been drinking but has given an estimated  amount of 10-12 beers daily and especially heavy use over the course of  the past week.  Unable to recall any triggers for his relapse.  Speech  is normal in pace, tone and production.  Affect is appropriate, actually  quite bright, mood is neutral.  Thought processes logical and coherent.  No evidence of mood fluctuations, thinking is linear, goal-directed.  He  would like to resume his previous medications and felt that he had  functioned fairly well on those.  No suicidal thoughts, categorically  denies any has had any suicidal thoughts recently, no homicidal thought.  No hallucinations.  Not complaining of any formication or other symptoms  of withdrawal at this point.  Cognition is preserved.   AXIS I:  EtOH abuse and dependence, bipolar disorder  AXIS II:  Deferred  AXIS III:  Coronary artery disease, history of GI bleed, left hip  arthritis.  AXIS IV:  Severe issues with legal problems and social issues  AXIS V:  Current 40 past year 102.   PLAN:  Is to voluntarily admit the patient to our dual diagnosis program  group and we are going to resume his Seroquel 400 mg XR at bedtime and  he has been placed on a Librium protocol for safe detox within 5 days.  He will receive tapered doses of Librium 25 mg four times today and in  taper fashion over the next 4 days and medications for symptom relief.  We are going to restart his Wellbutrin on the 16th after he has made  some progress with detox to reduce seizure risk.  We have placed him on  Protonix 40 mg daily and he is not complaining of hip pain at this time.   Estimated length of stay is 5 days.  He has voiced his agreement with  the plan.      Margaret A. Scott, N.P.      Geoffery Lyons, M.D.   Electronically Signed    MAS/MEDQ  D:  12/07/2006  T:  12/07/2006  Job:  [id number]

## 2010-06-07 NOTE — H&P (Signed)
NAMEZEESHAN, KORTE                   ACCOUNT NO.:  [id number]   MEDICAL RECORD NO.:  [id number]          PATIENT TYPE:  INP   LOCATION:  2109                         FACILITY:  [facility]   PHYSICIAN:  Lonia Blood, M.D.       DATE OF BIRTH:  November 13, 1950   DATE OF ADMISSION:  10/18/2006  DATE OF DISCHARGE:                              HISTORY & PHYSICAL   PRIMARY CARE PHYSICIAN:  No one.   CHIEF COMPLAINT:  Vomiting blood.   HISTORY OF PRESENT ILLNESS:  Mr. Simenson is a 60 year old gentleman with  history of coronary artery disease, who was using aspirin about 4 grams  per day for arthritis, who came into the emergency room after he had  about 24 hours of recurrent coffee ground emesis.  The patient also  reports liquid black bowel movements.  He reports that he is also kind  of dizzy and weak.  He denies any history of ulcer disease.  No history  of cirrhosis or varices that he knows of.   PAST MEDICAL HISTORY:  1. Coronary artery disease status post PTCA in 2007.  2. Depression.  3. Anxiety.  4. Hyperlipidemia.  5. Severe osteoarthritis status post total hip replacement on the      right.  Also, status post right total knee replacement and right      shoulder replacement.   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  He is  single.  He has no children.  He is currently on disability.   FAMILY HISTORY:  Positive for bipolar disorder in mother.  Cirrhosis in  brother.   HOME MEDICATIONS:  Aspirin, Wellbutrin and Propranolol.   REVIEW OF SYSTEMS:  As per HPI.  Also, note the patient denies chest  pain, denies shortness of breath, denies abdominal pain.  Other systems  as per HPI.  All other systems are negative.   PHYSICAL EXAMINATION:  VITAL SIGNS:  On admission shows a temperature of  97.6, blood pressure 110/80, pulse 114, respirations 20, saturation 100%  on room air.  GENERAL APPEARANCE:  An anxious gentleman sitting on the stretcher.  Alert, oriented to  place, person and time.  Pale.  HEAD:  Normocephalic, atraumatic.  Eyes:  Pupils equal, round, reactive  to light and accommodation.  Extraocular movements intact.  Throat  clear. NECK:  Supple.  No JVD.  CHEST:  Clear to auscultation bilaterally without wheezes, rhonchi or  crackles.  HEART:  Regular rate and rhythm without murmurs, rubs or gallops.  ABDOMEN:  Soft, nontender and nondistended.  Bowel sounds are present.  EXTREMITIES:  Lower extremities show no edema.  SKIN:  Pale, dry without any suspicious rashes.   LABORATORY DATA:  On admission white blood cell count is 16,000,  hemoglobin 11.3, platelets 378, sodium 140, potassium 3.8, chloride 110,  bicarb 21, BUN 36, creatinine 1, glucose 155, albumin 3.2, AST 18.  Myoglobin 68.9, troponin-I was less than 0.5, INR is 1.1, PTT is 32.  Portable chest x-ray is within normal limits.  Abdominal x-ray shows  multiple air fluid  levels in the abdomen but they do not appear specific  for obstruction.   ASSESSMENT/PLAN:  Upper gastrointestinal bleeding, most likely related  to either a gastritis or ulcer induced from nonsteroidal  antiinflammatory drug abuse.  The patient will be admitted to the  intensive care unit.  The patient will be placed on  proton pump  inhibitor intravenously.  The patient will be taken off of nonsteroidal  antiinflammatory drugs.  Gastroenterological consultation will be  obtained.  Transfusion with 2 units of packed red blood cells will be  initiated right away.  Intravenous vitamins and Ativan as needed will be  given for the patient's alcohol abuse.      Lonia Blood, M.D.  Electronically Signed     SL/MEDQ  D:  10/19/2006  T:  10/19/2006  Job:  [id number]

## 2010-06-07 NOTE — Consult Note (Signed)
[name], [name]                   ACCOUNT NO.:  [id number]   MEDICAL RECORD NO.:  [id number]          PATIENT TYPE:  INP   LOCATION:  2109                         FACILITY:  [facility]   PHYSICIAN:  Anselmo Rod, M.D.  DATE OF BIRTH:  1950-03-07   DATE OF CONSULTATION:  10/20/2006  DATE OF DISCHARGE:                                 CONSULTATION   REASON FOR CONSULTATION:  Melena with anemia.   ASSESSMENT:  1. Melena with anemia, hemoglobin of 13.5 g/dL on September 27, 2006,      with a drop to 9.8 g/dL today.  The patient gives a history of      melena for the last 2-3 days.  Rule out peptic ulcer disease versus      varices.  BUN was 36 on October 18, 2006, question upper      gastrointestinal source of blood loss.  2. History of nonsteroidal use, question abuse for arthritis.  The      patient takes about 4 g of aspirin per day when he has severe joint      pains.  3. History of tobacco and alcohol abuse.  4. Coronary artery disease status post PTCA in 2007.  5. History of anxiety and depression, on Wellbutrin and Seroquel at      home.  6. Hyperlipidemia.  7. On disability secondary to severe osteoarthritis.  8. History of degenerative joint disease and osteoarthritis status      post total hip replacement on the right side, total knee      replacement on the right side, and right shoulder replacement.   RECOMMENDATIONS:  1. EGD will be done today.  2. Continue serial CBCs.  3. Avoid all nonsteroidals for now.  4. Further recommendations to be made after being EGD has been done.   DISCUSSION:  [name] is a pleasant 60 year old white male with the  above-mentioned medical problems who was admitted to [facility] on  October 19, 2006, with a 2 day history of melena.  The patient claimed  he was in his usual state of health when he developed some postprandial  abdominal discomfort with some epigastric and periumbilical cramping and  subsequent diarrhea with black  stool.  The patient has been taking  aspirin off and on, as mentioned above, and has a history of alcohol  use, averaging anywhere between 4-5 beers per day or 3-4 times a week.  He denies a previous history of ulcer disease or varices.  There is no  history of nausea, vomiting, fever, chills, or rigors associated with  his symptoms at the present time.  He usually has regular bowel  movements with very rare bouts of constipation.  There is no history of  frank hematochezia.  His appetite is fair.  Weight has been stable  according to the patient.   PAST MEDICAL HISTORY:  See list above.   ALLERGIES:  CODEINE.  THERE IS NO KNOWN CONTRAST INTOLERANCE.   MEDICATIONS IN THE HOSPITAL:  1. Dilaudid on a p.r.n. basis.  2. Zofran p.r.n.  3. Protonix  40 mg IV q.12h.  4. Thiamine 100 mg IV daily.  5. Folic acid 1 mg IV daily.  6. Nicoderm patch.  7. Wellbutrin 150 mg p.o. daily.  8. Dulcolax p.r.n.  9. Fleets Phospho-Soda enemas p.r.n.  10.Ativan p.r.n.   SOCIAL HISTORY:  He is single and has no children.  He lives alone.  He  smokes about 10-15 cigarettes per day for the last 20-30 years.  He  drinks 4-5 alcoholic beverages per day.  He denies the use of illicit  street drugs.  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   FAMILY HISTORY:  There is no known family history of colon cancer.  His  brother has alcoholic cirrhosis.  Mother has bipolar disorder.   REVIEW OF SYSTEMS:  1. Periumbilical epigastric pain with melenic stools.  2. Weakness and dizziness.  3. No history of hematochezia.  4. No history of abnormal weight loss.   PHYSICAL EXAMINATION:  GENERAL:  Pleasant, cooperative middle-aged male  who appears to be older than his stated age in no acute distress lying  comfortably in bed.  Temperature 98.2, pulse 100 per minute, respiratory rate of 14 per  minute, blood pressure 120/72.  NECK:  Supple.  No JVD, thyromegaly,  lymphadenopathy.  CHEST:  Clear to auscultation.  S1, S2 regular.  ABDOMEN:  Soft, nondistended with mild epigastric tenderness on  palpation with no guarding, rebound, or rigidity.  No  hepatosplenomegaly.  DIGITAL RECTAL EXAMINATION:  Deferred.   A CBC today revealed a white count of 13.9 with a hemoglobin of 9.8 g/dL  down from 46.9 g/dL on September 27, 2006, and an MCV of 86.5 and  platelet count of 277,000.  CMET last done on September 25 revealed a  sodium of 140, potassium 3.8, chloride 110, CO2 21, glucose 155, BUN 36,  creatinine 1.2, total bilirubin 0.7, alk phos 60, AST 18, ALT 19, total  protein 60, albumin 3.2, calcium 9 mg/dL.  PT was 14.6 with an INR 1.1  on October 18, 2006.  PTT was normal at 32.  TSH was 2.059 on  September 29, 2006.  Cardiac markers were within normal limits on  October 18, 2006.  Type and screen has been ordered.  Further  recommendations to be made after the EGD has been done.      Anselmo Rod, M.D.  Electronically Signed     JNM/MEDQ  D:  10/20/2006  T:  10/20/2006  Job:  [id number]   cc:   Venita Lick. Russella Dar, MD, Elta Guadeloupe, M.D.

## 2010-06-07 NOTE — Cardiovascular Report (Signed)
[name], [name] NO.:  [id number]   MEDICAL RECORD NO.:  [id number]          PATIENT TYPE:  INP   LOCATION:  3711                         FACILITY:  [facility]   PHYSICIAN:  Peter M. Swaziland, M.D.  DATE OF BIRTH:  1950/05/25   DATE OF PROCEDURE:  04/27/2008  DATE OF DISCHARGE:  04/27/2008                            CARDIAC CATHETERIZATION   INDICATIONS FOR PROCEDURE:  A 61 year old white male with known history  of coronary artery disease status post angioplasty of posterolateral  branch in the right coronary in 2007.  He presents with severe  substernal chest pain consistent with unstable angina.   PROCEDURES:  1. Left heart catheterization.  2. Coronary and left ventricular angiography.   EQUIPMENT USED:  A 6-French 4-cm right and left Judkins catheter, 6-  French pigtail catheter, 6-French arterial sheath.   MEDICATIONS:  Local anesthesia 1% Xylocaine, Versed 3 mg IV, fentanyl 25  mcg IV.   ACCESS:  Via the right femoral artery using a standard Seldinger  technique.   CONTRAST:  100 mL of Omnipaque.   HEMODYNAMIC DATA:  Aortic pressure was 99/60 with mean of 77 mmHg.  Left  ventricle pressure was 96 with an EDP of 9 mmHg.   ANGIOGRAPHIC DATA:  The left coronary artery rises and distributes  normally.  The left main coronary artery has minor irregularities, less  than 10%.   The left anterior descending artery is a large vessel.  It has an  eccentric 50% bulky stenosis after the takeoff of the first septal  perforator.  The mid LAD is diffusely diseased up to 50%.  There are 3  some small diagonal branches.  The first diagonal branch has a high  takeoff and has an 80-90% stenosis proximally.   There is a small intermediate branch, which is without significant  disease.   Left circumflex coronary artery gives rise to 2 marginal vessels.  The  first marginal vessel has a 30-40% stenosis proximally.  The second  marginal branch has a 50-60% stenosis  proximally.   The right coronary is a very large dominant vessel and has no  significant disease throughout the main vessel.  The PDA is widely  patent.  The posterolateral branch has approximately 30-40% narrowing.  The site of the angioplasty previously is still widely patent.   Ventricular angiography was performed in the RAO view.  This  demonstrates normal left ventricular size and contractility with normal  systolic function.  Ejection fraction is estimated 65%.   FINAL INTERPRETATION:  1. Modest diffuse atherosclerotic coronary artery disease.  He does      have a high-grade stenosis and a very small diagonal branch, which      is really not suitable for intervention.  Compared to his prior      cardiac catheterization in 2007, there has been some regression of      disease in the marginal branches.  There is some mild progression      of disease in the mid left anterior descending.  The diagonal      lesion is  unchanged.  2. Normal left ventricular function.   PLAN:  Since the bulk of his disease is within small branches that are  not suitable for intervention, we would recommend continued medical  therapy with aggressive lipid-lowering therapy, antiplatelet therapy,  and smoking cessation.     ______________________________  Peter M. Swaziland, M.D.    ______________________________  Peter M. Swaziland, M.D.    PMJ/MEDQ  D:  04/27/2008  T:  04/28/2008  Job:  [id number]

## 2010-06-07 NOTE — H&P (Signed)
NAME:  [name], [name]                   ACCOUNT NO.:  [id number]   MEDICAL RECORD NO.:  [id number]          PATIENT TYPE:  IPS   LOCATION:  0300                          FACILITY:  [facility]   PHYSICIAN:  Jasmine Pang, M.D. DATE OF BIRTH:  [birth year]   DATE OF ADMISSION:  06/23/2006  DATE OF DISCHARGE:                       PSYCHIATRIC ADMISSION ASSESSMENT   IDENTIFYING INFORMATION:  This is a 60 year old single white male.  He  presented to the Emergency Department at [facility].  He  reported drinking for the past week and he cannot stop.  He also reports  increasing depression in that he had run out of Seroquel and Ambien.  He  states that with his overuse of Ambien he went to the grocery store,  spent money, went to [place], and just has no memory of doing any of  these activities.  He states that he knows at this point in time he  needs help to stop drinking.  He is worried about not having had a  relationship for the past 14 years.  He is gay.  He states that this  year he has had his best financial year in many, many years.  He is  getting ready to move out into his own house and he just is not sure why  he cannot have a relationship.  He reports that he has got a history for  bipolar and anxiety although his admitting diagnosis was major  depressive disorder.   PAST PSYCHIATRIC HISTORY:  He has been an outpatient at [facility] since 2006.  This coincided with him having to be in an  assisted living setting.  He had undergone joint replacement,  his right  shoulder and right hip, and this triggered a depressive episode.  He has  been attending AA since 2007.   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  He has  never married.  He is gay.  He has no children.  No relationship for 14  years.  He is employed.  He is currently sharing a house with two other  guys but is planning to move into his own home at the end of the month.   FAMILY HISTORY:  His entire family has substance abuse.  His mother was  a severe bipolar and did eventually suicide.   ALCOHOL AND DRUG HISTORY:  He is currently smoking 1/2 pack per day of  cigarettes.  He was drinking beer 12 ounces one every hour for the past  6 days.   His primary care Gavyn Zoss is Dr. Glade Stanford.  He is followed  psychiatrically by Dr. Hortencia Pilar and [facility].   MEDICAL PROBLEMS:  He is status post right hip and right shoulder  replacement, right patellar reconstruction, and right coronary artery  angioplasty in August 2007.   MEDICATIONS:  He reports that he is currently on plain Wellbutrin 200 mg  p.o. q.a.m.; Ambien 10 mg at h.s.; Seroquel 50 mg one or two daily  p.r.n.; he is also continuing to take Toprol XL 50 mg p.o.  daily and  enteric-coated aspirin 325 mg p.o. daily.   DRUG ALLERGIES:  CODEINE.   POSITIVE PHYSICAL FINDINGS:  He was medically cleared in the ED at  [facility].  He had no remarkable physical findings.  He does have  surgical scars consistent with his joint replacements.  His CIWA was 14.  His urine drug screen was negative.  His alcohol level was 55 in the ED.  He had no other remarkable labs.   His vital signs show he is 70 inches tall.  His blood pressure was  177/79.  Temperature was 100.1.  Blood pressure 135/79 to 139/85.  Pulse  was 109-114.  Respirations are 20.   MENTAL STATUS EXAM:  Today, he is alert and oriented x3.  He is  appropriately groomed, dressed, and nourished.  His speech is not  pressured.  His mood is appropriate to the situation.  His affect had a  normal range.  His motor was within normal limits.  He had good eye  contact.  Thought processes are clear, rational, and goal-oriented.  He  wants to stop drinking and stabilize his mood.  Judgement and insight  are intact.  Concentration and memory are intact.  Intelligence is at  least average.  He specifically denies being suicidal or  homicidal.  He  denies any auditory or visual hallucinations.   DIAGNOSES:  AXIS I:  Major depressive disorder recurrent severe versus  bipolar and anxiety by history currently depressed.  AXIS II:  Deferred.  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  AXIS IV:  Chronic mental illness.  Problems with primary support group.  AXIS V:  30.   PLAN:  The plan is to admit for crisis stabilization and to help with  detoxification.  Towards that end, he was started on low-dose Librium  protocol.  He was agreeable to increasing his Wellbutrin to 300 mg p.o.  daily.  We will also add Lamictal to help improve his mood and he asked  for Restoril to help with sleep.  He stated when he received that in the  past, it was very helpful.  He denies any symptoms of sleep apnea and he  is interested in finding a therapist.   ESTIMATED LENGTH OF STAY:  Four to five days.      Mickie Leonarda Salon, P.A.-C.      Jasmine Pang, M.D.  Electronically Signed    MD/MEDQ  D:  06/24/2006  T:  06/24/2006  Job:  [id number]

## 2010-06-07 NOTE — Discharge Summary (Signed)
NAMEJERRETT, [name]                   ACCOUNT NO.:  [id number]   MEDICAL RECORD NO.:  [id number]          PATIENT TYPE:  IPS   LOCATION:  0300                          FACILITY:  [facility]   PHYSICIAN:  Jasmine Pang, M.D. DATE OF BIRTH:  1950-09-01   DATE OF ADMISSION:  06/23/2006  DATE OF DISCHARGE:  06/27/2006                               DISCHARGE SUMMARY   IDENTIFYING INFORMATION:  This is a 60 year old single white male who  was admitted on 06/23/2006.   HISTORY OF PRESENT ILLNESS:  The patient presented to the emergency  department at [facility].  He reported drinking for the past  week and cannot stop.  He also reports increasing depression and that he  had run out of Seroquel and Ambien.  He states that with his overuse of  Ambien, he went to the grocery store, spent money, went to [employer]  and had no memory of doing any these activities.  He states that he  knows that he knows at this point in time he needs help to stop  drinking.  He is worried about not having a relationship for the past 14  years.  He is gay and has not met a partner.  He states that this year  has been his best financial year in many many years.  He is getting  ready to move out into his own house and is not sure why he cannot have  a relationship.  He reports that he has got a history for bipolar and  anxiety disorder, although the admitting diagnosis was major depressive  disorder.  The patient has been an outpatient at [facility] since 2006.  This coincided with him having to be in an  assisted living setting.  He had undergone joint replacement with his  right shoulder and right hip and this triggered a depressive episode.  He has been attending AA since 2007.  His entire family has substance  abuse according to him.  He also says his mother had severe bipolar  disorder and did eventually commit suicide.  He is currently smoking a  half pack cigarettes per day.   He has been drinking beer about 12 ounces  one every hour for the past 6 days.  He is followed at [facility]  by Dr. Hortencia Pilar.  He is status post right hip and right shoulder  replacement, right patellar reconstruction and right coronary artery  angioplasty.  He reports that he is on Wellbutrin 200 mg p.o. q.a.m.,  Ambien 10 mg at bedtime, Seroquel 50 mg 1 or 2 daily p.r.n..  He is also  continuing to take Toprol XL 50 mg p.o. daily and enteric-coated aspirin  325 mg p.o. q. Day.   ALLERGIES:  He is allergic to CODEINE.   PHYSICAL FINDINGS:  The patient was medically cleared in the ED at  [facility].  He has no remarkable physical findings.  He does have  surgical scars consistent with his joint replacements.  His CIWA was 14.  His urine drug screen was negative.  His alcohol level was 55 in the ED.  He had no other remarkable labs.   HOSPITAL COURSE:  Upon admission, the patient was started on the Librium  detox protocol.  He was also started on trazodone 50 mg p.o. q.h.s.  p.r.n. insomnia.  May repeat times one.  On 06/24/2006 he was restarted  on Toprol XL 50 mg p.o. daily and enteric aspirin 325 mg p.o. q. day.  He was started on Restoril 30 mg p.o. q.h.s. p.r.n. insomnia.  Wellbutrin was increased to 300 mg in the a.m. He was started on  Lamictal 25 mg p.o. q.h.s. x14 days. On 06/25/2006 Restoril was increased  to 30 mg p.o. q.h.s..  May give an additional Restoril 15 mg h.s. if he  wakes up.  He was also placed on a 21 mg nicotine patch as per smoking  cessation protocol.  The patient was friendly and cooperative and  helpful with historical information.  He was able to participate  appropriately in unit therapeutic groups and activities.  He remained  depressed and quite anxious initially during the hospitalization.  He  continued to talk about his desire to be in a relationship and the fact  that he has not been in one for 14 years.  He  was having trouble  sleeping but  the increase in Restoril as indicated above was helpful  with that.  On 06/27/2006 mental status had improved markedly from  admission status.  There was no suicidal ideation or dangerous thoughts.  Affect was appropriate, mood was euthymic.  Speech articulate and  fluent.  He was cooperative with good eye contact.  Thoughts were goal-  directed.  No dangerous ideas, no psychosis.  Cognition and full contact  with reality, no deficits.  He was felt safe to be discharged today.   DISCHARGE DIAGNOSES:  AXIS I: Mood disorder NOS (rule out bipolar  disorder).  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  AXIS V: GAF upon discharge was 50.  GAF upon admission was 30.  GAF  highest past year was 60-65.   DISCHARGE/PLAN:  There were no specific activity level or dietary  restrictions.   POST HOSPITAL CARE PLANS:  The patient will be seen by Dr. Joni Reining at  [facility] on June 5 at 3:30 p.m.   DISCHARGE MEDICATIONS:  Lamictal 25 mg daily, Restoril 30 mg at bedtime,  aspirin 325 mg daily, Toprol XL 50 mg daily, plain Wellbutrin 300 mg  daily.      Jasmine Pang, M.D.  Electronically Signed     BHS/MEDQ  D:  07/19/2006  T:  07/19/2006  Job:  [id number]

## 2010-06-07 NOTE — H&P (Signed)
NAMETYRONE, PAUTSCH                   ACCOUNT NO.:  [id number]   MEDICAL RECORD NO.:  [id number]          PATIENT TYPE:  INP   LOCATION:  2014                         FACILITY:  [facility]   PHYSICIAN:  Vernice Jefferson, MD          DATE OF BIRTH:  1951-01-08   DATE OF ADMISSION:  07/20/2008  DATE OF DISCHARGE:                              HISTORY & PHYSICAL   CARDIOLOGIST:  Peter M. Swaziland, MD   CHIEF COMPLAINT:  Chest discomfort.   HISTORY OF PRESENTING ILLNESS:  The patient is a 60 year old white male  with history of hypertension, hyperlipidemia, and moderate CAD on last  cath, but with severe disease and a small ostial D1 with complaints of  chest discomfort since 8 p.m. last night that was constant, but occurred  suddenly while watching TV yesterday.  He sat at home throughout the  rest of the day since he did not resolves.  He decided to present to the  emergency department.  Chest pain is similar to his last presentation  for which he was catheterized, but no intervention was performed.  At  the present, his report says no exacerbating or relieving factors  similar to this chest pain, does not radiate anywhere.  It is not  associated of any diaphoresis or nausea.  His heart rate, however, is  noted to be in emergency department has been 120 and constant, has not  changed.  He has not noticed any palpitations at home, but does thinks  his heart was racing at the time of interview.   PAST MEDICAL HISTORY:  1. Moderate coronary artery disease, 50% mid LAD, 40% OM1, 60% OM2 and      patent PTCA site of his RCA.  This was performed on April 27, 2008.  2. Hypertension.  3. Hyperlipidemia.  4. History of polysubstance abuse.   SOCIAL HISTORY:  Positive for tobacco, alcohol, history of substance  abuse in the past.  He is an Airline pilot.   FAMILY HISTORY:  Reviewed and it is noncontributory to the patient's  current medical condition.   REVIEW OF SYSTEMS:  Negative all 14-point review of  systems except for  those dictated in the above HPI.   MEDICATIONS:  1. Aspirin 81 mg a day.  2. Metoprolol 50 mg a day.  3. Amlodipine 5 mg a day.  4. Wellbutrin 300 mg a day.  5. Crestor 20 mg a day.  6. Nitroglycerin p.r.n.  7. Xanax 0.5 b.i.d.   ALLERGIES:  No known drug allergies, but CODEINE does make a mist.   PHYSICAL EXAMINATION:  VITAL SIGNS:  Blood pressure is 126/90, heart  rate of 120, respirations of 12.  He is afebrile.  GENERAL:  Well-developed, well-nourished, white male in no acute  distress.  HEENT:  Moist mucous membranes.  Sclerae icterus.  No conjunctival  pallor.  NECK:  Full range of motion.  No jugular venous distention.  CARDIOVASCULAR:  Regular rate and rhythm.  No rubs, murmurs, or gallops.  CHEST:  Clear to auscultation bilaterally.  No wheezes, rales, or  rhonchi.  ABDOMEN:  Soft, nontender, and nondistended.  Normoactive bowel sounds.  EXTREMITIES:  No peripheral edema.  Pulses are 2+ bilaterally.  NEUROLOGIC:  Nonfocal.  Alert and oriented x3 with cranial nerves  grossly intact.  No focal neurologic deficits.  SKIN:  No rashes.  No lesions.  MUSCULOSKELETAL:  No joint deformities.  No effusions.   Chest x-ray demonstrates no acute process.  EKG, possible atrial  tachycardia versus sinus tach, ST and T waves segments were essentially  unchanged and his axis is unchanged.   LABORATORY DATA:  Significant for white count 9.3, hemoglobin 12.8,  platelets of 220.  His BUN and creatinine is 7 and 0.86 with normal  potassium.  His BNP is below 30.   IMPRESSION:  1. Possible atrial tachycardia versus sinus tachycardia.  2. Atypical chest pain with known nonobstructive coronary artery      disease.  3. Hypertension.  4. Hyperlipidemia.   PLAN:  We will give the patient IV diltiazem 20 mg while here in the  emergency department to see if can break this tachycardia and initially,  we will increase his metoprolol dosing from 50 of XL daily to 60  every 6  hours at the present.  We will follow up serial biomarkers over the  evening, however, since he has had chest pain for 24 hours and his  biomarkers were still completely normal, we will hold on heparinization  at this point for I do not think any acute plaque rupture is etiology  behind this pain.  We will check a D-dimer to evaluate for pulmonary  embolic disease, possibly if this is a sinus tachycardia.  We will  continue his home medication versus chronic medical conditions.      Vernice Jefferson, MD  Electronically Signed     Vernice Jefferson, MD  Electronically Signed    JT/MEDQ  D:  07/20/2008  T:  07/21/2008  Job:  [id number]

## 2010-06-07 NOTE — Op Note (Signed)
NAME:  [name], [name]                   ACCOUNT NO.:  [id number]   MEDICAL RECORD NO.:  [id number]          PATIENT TYPE:  INP   LOCATION:  5021                         FACILITY:  [facility]   PHYSICIAN:  Harvie Junior, M.D.   DATE OF BIRTH:  1950/07/02   DATE OF PROCEDURE:  02/04/2007  DATE OF DISCHARGE:                               OPERATIVE REPORT   PREOPERATIVE DIAGNOSIS:  End-stage degenerative joint disease left hip.   POSTOPERATIVE DIAGNOSIS:  End-stage degenerative joint disease left hip.   OPERATIVE PROCEDURE:  Left total hip replacement with a S-ROM system.  We used size 18, 13 stem with an 18 D extra-large cone, 18 F cone with  an extra-large spout, a 52-mm ASR cup and a 46-mm head with a +0 ball.  We had 10 degrees of anteversion on the stem.   ASSISTANT:  Marshia Ly, P.A.   ANESTHESIA:  General.   BRIEF HISTORY:  [name] is a 60 year old male with long history of  having severe end-stage degenerative joint disease of his left hip.  He  had been treated conservatively for a period of time, all of this  failed.  Because of continued unrelenting pain in the left hip, he was  taken to the operating room for left total hip replacement.   PROCEDURE:  The patient was taken to the operating room.  After adequate  anesthesia was obtained with general anesthetic the patient was placed  on the operating table and then moved in the right lateral decubitus  position. All bony prominences were well padded and axillary roll was  put in place.   Following this, attention was turned to the left hip where after routine  prep and drape an incision was made for a posterior approach to the hip.  The subcutaneous tissue dissected down.  Tensor fascia was divided in  line with its fibers.  Charnley retractor was then put in place.  Short  external rotators were identified, as well as piriformis and these were  released posteriorly allowing access to the hip.  The hip was then  dislocated  and a provisional neck cut was made relative to a 36 +8 stem  which was chosen as the preoperative template.  At this point the  attention was turned towards the acetabulum where a dramatic amount of  capsular tissue needed to be removed to allow access to the acetabulum.  The acetabulum was sequentially reamed to a level of 51 and the 52-mm  cup.  ASR was then put in place. There was a tremendous amount of  osteophyte anteriorly which was removed with an osteotome. Once this was  completed, attention was then turned to the stem size which was  sequentially reamed to a level of 13 and a 13.5 reamer was taken down.  This was after introduction was made and care to being ensure that we  were in the canal.  Once this was taken, an 18, 13 cone was then used, D  and then F.  Once we got to the F cone extra-large spout was chosen  which  gave an excellent fit into the calcar region. Once this was  completed an 18, 13 trial was put in place. An 18 x 13 with a 36 +8 base  neck was chosen and this was put in place. This gave excellent range of  motion and stability and once this was completed, the attention was then  turned towards removal of the trial components and then the final  components were then opened and put in. An 79 F with an extra-large  spout was put in first and then an 18 x 13 with 36 +8 offset was put in  place with 10 degrees of anteversion relative to the cone and then a 46  with a +0 neck was chosen. This was for the ASR cup which had been  previously placed.   At this point a final range of motion was trialed. Excellent stability  and range of motion were achieved and at this point the wound was  copiously and thoroughly irrigated.  The short external rotators and  piriformis were repaired to  the posterior intertrochanteric line through drill holes. The tensor  fascia was closed with 1 Vicryl running, skin with 0 and 2-0 and skin  staples. Sterile dressing was then applied,  as well as knee immobilizer.  The patient was taken to recovery room and noted to be in satisfactory  condition. The estimated blood loss for the procedure was 300 cc.      Harvie Junior, M.D.  Electronically Signed     JLG/MEDQ  D:  02/04/2007  T:  02/05/2007  Job:  [id number]

## 2010-06-07 NOTE — H&P (Signed)
NAMESERGEI, DELO                   ACCOUNT NO.:  [id number]   MEDICAL RECORD NO.:  [id number]          PATIENT TYPE:  IPS   LOCATION:  0504                          FACILITY:  [facility]   PHYSICIAN:  Geoffery Lyons, M.D.      DATE OF BIRTH:  [birth year]   DATE OF ADMISSION:  09/29/2006  DATE OF DISCHARGE:                       PSYCHIATRIC ADMISSION ASSESSMENT   IDENTIFYING INFORMATION:  This is a voluntary admission to the services  of Dr. Geoffery Lyons.  This is a 60 year old single white male.  He  presented to the emergency department a little after midnight last  night.  His chief complaint was __________  pain.  He stated that he  was having generalized pain and weakness all over for several days.  He  states he is having personal issues at home that are making things  worse.  When last with [place] in May, May 31 to June 4, at that point in  time he reported that he had had no gay relationship for the past 14  years, so this relationship is new and apparently he found his new  significant other in bed with another man and this has created an excuse  to drink again.  His presenting alcohol level was 310.  It came down to  179 prior to be transported over her to the [facility].  His CIWA was 7.  His urine drug screen was positive for benzos and  barbiturates.  He is not sure how that could have happened.  His EKG  showed an inferior infarct age undetermined, and again this was  information from him.  He states that he just got approved for  disability in June.  He has not yet moved out to a single apartment and  it was suggested strongly to him that he reconsider assisted living as  he seems to be making bad lifestyle choices being out and about.   PAST PSYCHIATRIC HISTORY:  As already stated, he has most recently been  with [place] May 31 to June 4.  He has a long history for alcohol abuse.   SOCIAL HISTORY:  He has an accounting degree from [employer]  He is single.  He has no children and  he currently is receiving disability for his  joint replacements.   MEDICAL PROBLEMS:  He is known to have coronary artery disease.  He is  status post an angioplasty.  He has had atypical chest pain,  hyperlipidemia, and degenerative joint disease, with joint replacements  of right hip and right shoulder.   ALCOHOL AND DRUG ABUSE:  He has been using alcohol since age 17.   MEDICAL PROVIDERS:  His primary care Noella Kipnis is Dr. Jeri Cos.  He is followed at [facility] by Dr. Hortencia Pilar.   MEDICATIONS:  He reports that he is supposed to be taking Toprol XL,  however he is unsure of the dose, Seroquel 50 mg in the morning, 400 mg  at h.s., and that is the XL version, and Wellbutrin 200 mg in the a.m.,  however the pharmacy is not yet  open and we cannot confirm.   ALLERGIES:  CODEINE.   POSITIVE PHYSICAL FINDINGS:  PHYSICAL EXAMINATION:  He was medically  cleared in the ED and as already noted he had an elevated alcohol level.  His labs showed that his SGOT is elevated at 134.  His BUN was slightly  low at 5.  His CBC was within normal limits.  His urinalysis was also  within normal limits.  Vital signs on admission to our unit show that he  is 5 feet 8 inches, he weighs 181, temperature is 98.4, blood pressure  is 177/92 to 153/100.  Pulse is 110-129 and respirations are 20.  He has  an old scar on his right shoulder from shoulder replacement.  He has  also had knee surgery and a hip replacement.  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   MENTAL STATUS EXAM:  He was alert and oriented, easily roused.  His  motor is slightly neurovegetative.  He will be put on fall precautions  due to his joint replacement and being detoxed from alcohol.  His speech  is a normal rate, rhythm and tone.  His mood is depressed, his affect is  flat.  Thought processes are clear, rational and goal oriented.  Judgment and insight are fair.  Concentration and memory are good.   Intelligence is at least average.  He denies being suicidal or  homicidal.  He denies auditory or visual hallucinations.   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  AXIS II:  Deferred.  AXIS III:  History for coronary artery disease with angioplasty,  hyperlipidemia, atypical chest pain, and joint replacements of right hip  and right shoulder.  AXIS IV:  Problems with primary support group.  AXIS V:  Global assessment of function is 45.   PLAN:  Admit to support through detoxification from alcohol.  We will  adjust his medications if indicated, and we will have him work with the  case manager regarding a discharge placement.  He is not doing well on  his own and should reconsider assisted living.   ESTIMATED LENGTH OF STAY:  3-4 days.      Mickie Leonarda Salon, P.A.-C.      Geoffery Lyons, M.D.  Electronically Signed    MD/MEDQ  D:  09/30/2006  T:  09/30/2006  Job:  [id number]

## 2010-06-07 NOTE — Discharge Summary (Signed)
NAME:  [name], [name]                   ACCOUNT NO.:  [id number]   MEDICAL RECORD NO.:  [id number]          PATIENT TYPE:  INP   LOCATION:  4732                         FACILITY:  [facility]   PHYSICIAN:  Lonia Blood, M.D.      DATE OF BIRTH:  06-07-1950   DATE OF ADMISSION:  10/19/2006  DATE OF DISCHARGE:  10/23/2006                               DISCHARGE SUMMARY   PRIMARY CARE PHYSICIAN:  The patient was unassigned to [place].   DISCHARGE DIAGNOSES:  1. Upper gastrointestinal bleed most likely secondary to esophagitis.  2. Acute blood loss anemia.  3. Coronary artery disease status post PTCA in 2007.  4. Dyslipidemia.  5. Anxiety.  6. Depression.  7. Osteoporosis.  8. Polysubstance abuse.   DISCHARGE MEDICATIONS:  1. Seroquel 400 mg q.h.s.  2. Wellbutrin 200 mg in the morning.  3. Multivitamin 1 tablet daily.  4. Iron 325 mg daily.  5. Protonix 40 mg daily.   DISPOSITION:  The patient was discharged in stable health.  He was to  follow-up with Dr. Anselmo Rod in 3 weeks.  His hemoglobin was  stable at the time of discharge.   PROCEDURE PERFORMED:  1. EGD performed by Dr. Anselmo Rod on October 21, 2006.  2. Abdominal film performed on September 26 that shows air-fluid      levels in the colon which could reflect diarrheal disease.  No      dilated bowel identified.   CONSULTATIONS:  Dr. Anselmo Rod, Gastroenterology.   BRIEF HISTORY AND PHYSICAL:  Please refer to dictated history and  physical by Dr. Lonia Blood.  In short, however, the patient was a 60-  year-old gentleman that came in, secondary to having vomited some blood.  The patient denied any specific abdominal pain.  He abuses both alcohol  and tobacco.  In the ED, the patient's hemoglobin was found to be was  found to be low at 11.3.  He was subsequently admitted for further  management.   HOSPITAL COURSE:  1. Upper GI bleed.  The patient was admitted IV fluids.  He was typed      and cross-matched for 2  units of packed red blood cells and      subsequently transfused, after his hemoglobin dropped to around 8.      He was placed on IV Protonix with bowel rest, and GI was consulted.      Subsequent EGD showed gastroesophagitis.  The patient was follow-up      in the hospital serially on a daily basis, until his hemoglobin was      stable, prior to his discharge.  He was counseled on stopping      drinking, to avoid further bleeds.  Also, to stop all nonsteroidal      anti-inflammatory agents.  2. Polysubstance abuse.  Again, the patient was counseled extensively      on his poly substance abuse.  3. Coronary artery disease.  This was for the most part stable in the      hospital, with no visible chest  pain and no evidence of further      coronary artery disease.  4. Hypokalemia.  Again, this was transient in the hospital, and the      patient the patient's potassium was repleted subsequently.      Lonia Blood, M.D.  Electronically Signed     LG/MEDQ  D:  12/03/2006  T:  12/03/2006  Job:  [id number]

## 2010-06-07 NOTE — Discharge Summary (Signed)
NAME:  [name], [name]                   ACCOUNT NO.:  [id number]   MEDICAL RECORD NO.:  [id number]          PATIENT TYPE:  INP   LOCATION:  2014                         FACILITY:  [facility]   PHYSICIAN:  Peter M. Swaziland, M.D.  DATE OF BIRTH:  12/24/1950   DATE OF ADMISSION:  07/20/2008  DATE OF DISCHARGE:  07/22/2008                               DISCHARGE SUMMARY   HISTORY OF PRESENT ILLNESS:  Mr. Lisenbee is a 60 year old white male with  known history of coronary artery disease.  He presented with symptoms of  substernal chest pain.  The pain began the night prior to admission and  was constant in nature.  It was mid-substernal.  It occurred at rest.  He complained of some mild chest congestion and felt like he was getting  pneumonia.  He presented to emergency department.  His evaluation there  showed a persistent elevation of his heart rate between 120 and 125  beats per minute.  Of note, the patient did have recent cardiac  catheterization in April 2010, which showed moderate coronary artery  disease.  He had a 50% stenosis in the mid LAD.  The first diagonal  branch had an 80-90% stenosis proximally with very small vessel.  He  also had a 50-60% stenosis in the second marginal branch.  Left  ventricular function was normal.  It was recommended the patient be  treated medically at that time.  It is noteworthy that he has failed  follow up appointment since then.  The patient does have a history of  hypertension and hyperlipidemia.  For details of his past medical  history, social history, family history, and physical exam, please see  admission history and physical.   LABORATORY DATA:  His ECG shows sinus tachycardia without any acute  change.  He had a chest x-ray, which showed no active disease.  He had a  CT angiogram of his chest, which showed no evidence of pulmonary emboli  or thoracic aortic disease.  He had mild cardiomegaly.  There was an  indeterminate 2 cm right renal lesion.   White count is 11,300,  hemoglobin 12.8, hematocrit 38.6, and platelets 220,000.  Point of care  cardiac markers were negative.  Urinalysis was negative.  Sodium 137,  potassium 4.4, chloride 102, CO2 of 21, BUN 7, creatinine 0.86, and  glucose of 71.  BNP level is less than 30.  D-dimer was 0.52.  Urine  drug screen was negative.  Coags were normal.  Serial cardiac enzymes  were negative.  TSH was 1.268.   HOSPITAL COURSE:  The patient was admitted to telemetry.  His amlodipine  was discontinued and he was started on diltiazem.  His metoprolol dose  was increased.  This resulted in resolution of his sinus tachycardia.  His chest pain resolved.  He was progressively ambulated in the hall  without any further difficulty.  He did have a follow up renal  ultrasound to evaluate renal lesion and this showed that this was a  simple cyst.  There was no evidence of hydronephrosis.  The patient was  discharged home in stable condition on July 22, 2008.   DISCHARGE DIAGNOSES:  1. Atypical chest pain.  2. Renal cyst.  3. Coronary artery disease, moderate.  4. Hypertension.  5. Sinus tachycardia.  6. Hypercholesterolemia.  7. History of bipolar disorder with anxiety.  8. History of esophagitis.   DISCHARGE MEDICATIONS:  1. Aspirin 81 mg daily.  2. Metoprolol 50 mg twice a day.  3. Wellbutrin 300 mg per day.  4. Crestor 20 mg per day.  5. Nitroglycerin sublingual p.r.n.  6. Diltiazem SR 120 mg daily.  7. Xanax 0.5 mg q.8 h. p.r.n.  The patient was given a single      prescription for 30 tablets with no refills.   It was recommended, he stop his amlodipine.  He will be recommended  follow up with Dr. Swaziland in 2 weeks.   DISCHARGE STATUS:  Improved.           ______________________________  Peter M. Swaziland, M.D.     PMJ/MEDQ  D:  07/22/2008  T:  07/22/2008  Job:  [id number]

## 2010-06-07 NOTE — H&P (Signed)
NAMEJAXSTON, [name] NO.:  [id number]   MEDICAL RECORD NO.:  [id number]          PATIENT TYPE:  IPS   LOCATION:  0306                          [facility]ILITY:  [facility]   PHYSICIAN:  Jasmine Pang, M.D. DATE OF BIRTH:  [birth year]   DATE OF ADMISSION:  12/14/2007  DATE OF DISCHARGE:                       PSYCHIATRIC ADMISSION ASSESSMENT   IDENTIFYING INFORMATION:  A 60 year old Caucasian male.  This is a  voluntary admission.   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  He reports he has been quite stable since his last  admission here in December of 2008.  Had been doing very well on  Seroquel 400 mg p.o. q.h.s. until about 1 month ago when he felt that it  was not working.  He was becoming more anxious, feeling he could not  sleep at night.  Meanwhile, he had been abstinent from alcohol since his  last discharge from here in December of 2008.  His psychiatrist, Dr.  Hortencia Pilar, at the [facility] placed him on Ambien 10 mg.  On the day  of admission, he took one of those Ambien, subsequently became confused,  disoriented, and inadvertently took an additional 6 or 7 more tablets  over a 24-hour period.  His housemate brought him to the emergency room  for confusion.  He denies any suicidal intent or thoughts preceding this  episode.  He is fully oriented today.  Fully cooperative with the  interview, in full contact with reality.  He denies any suicidal  thoughts.   PAST PSYCHIATRIC HISTORY:  One of several admissions for this 57-year-  old who is followed by Dr. Hortencia Pilar at the [facility], last  admitted here December 06, 2007, through December 10, 2007, for alcohol  dependence.  Detoxed at that time and discharge stabilized on Seroquel  XR 400 mg at bedtime, Wellbutrin 200 mg q.a.m.  Has done well since then  and reports abstinence from  alcohol.  He has a history of mood swings  with significant anxiety in addition to his alcohol dependence.   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  Living in his own home with a  housemate.  Reports he is coping financially.  Never married.  No  children.  No legal problems.   MEDICAL HISTORY:   PRIMARY CARE PHYSICIAN:  Is not clear.   MEDICAL PROBLEMS:  1. Hypertension.  2. History of anxiety and depression.  3. History of esophagitis.   PAST MEDICAL HISTORY:  Significant for left hip total arthroplasty by  Dr. Luiz Blare in 2009.   CURRENT MEDICATIONS:  1. Seroquel XR 400 mg p.o. q.h.s.  2. Ambien 10 mg h.s. p.r.n. insomnia.  3. Inderal 40 mg daily.  4. Wellbutrin XL 300 mg daily.  5. Protonix 40 mg daily.   DRUG ALLERGIES:  NO KNOWN DRUG ALLERGIES.   PHYSICAL EXAM:  Was done in the emergency room as noted in  the record.  His urine drug screen was negative for all substances.  Alcohol level  less than 5.  CBC and CMET unremarkable.  Acetaminophen level less than  10 and salicylate level negative.   MENTAL STATUS EXAM:  Fully alert male, pleasant, cooperative, polite, in  full contact with reality.  Speech is normal.  Gives a coherent history.  Was doing well up until the time he could not sleep.  Remembers taking  the first Ambien and does not remember much after that.  Mood is neutral  today, pleasant, polite, has a sense of humor.  Thought process logical  and coherent.  Insight is good.  Impulse control and judgment within  normal limits.  Immediate to recent remote memory are intact except for  specific details of activities leading up to admission.  No homicidal,  no suicidal thoughts.  AXIS I:  1. 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  2. Alcohol dependence, in partial remission.  AXIS II:  Deferred.  AXIS III:  1. Mitral valve prolapse.  2. Hypertension.  3.  History of left total hip replacement.  AXIS IV:  Deferred.  AXIS V:  Current 49, past year not known.   PLAN:  Is to voluntarily admit him to stabilize him.  He is on our dual  diagnosis program.  We have provided him with Librium 25 mg every p.r.n.  for anxiety or agitation but he has not so far required this.  We are  going to increase his Seroquel to 600 mg XR q.h.s. and 50 mg t.i.d.  p.r.n. for anxiety which he says this worked well for him before.   ESTIMATED LENGTH OF STAY:  Three days.  At this time, we will hold his  Wellbutrin.      Margaret A. Lorin Picket, N.P.      Jasmine Pang, M.D.  Electronically Signed    MAS/MEDQ  D:  12/16/2007  T:  12/16/2007  Job:  [id number]

## 2010-06-07 NOTE — Discharge Summary (Signed)
NAMEHYDER, DEMAN NO.:  [id number]   MEDICAL RECORD NO.:  [id number]          PATIENT TYPE:  INP   LOCATION:  3711                         FACILITY:  [facility]   PHYSICIAN:  Peter M. Swaziland, M.D.  DATE OF BIRTH:  06/21/1950   DATE OF ADMISSION:  04/25/2008  DATE OF DISCHARGE:  04/27/2008                               DISCHARGE SUMMARY   HISTORY OF PRESENT ILLNESS:  [name] is a 60 year old white male with a  history of coronary disease.  He had remote angioplasty of the  posterolateral branch of the right coronary artery in August 2007.  He  subsequently had cardiac catheterization in November 2007, which showed  that this site was still patent.  He did have fairly diffuse 50%  stenosis in the LAD.  He had a very small diagonal branch, which had 80-  90% stenosis as well as disease in the small marginal branch.  He has  done well since that time.  The patient does have a history of anxiety  and hypertension.  He has a prior history of esophagitis.  He does admit  he has had multiple stressors recently and has resumed smoking and  drinking alcohol 2 days prior to his admission.  He was admitted for  further evaluation and treatment.   For details of past medical history, social history, family history, and  physical exam, please see admission history and physical.   LABORATORY DATA:  ECG showed normal sinus rhythm with minimal  nonspecific ST-T wave abnormalities.  Chest x-ray showed no acute disease.  His white count was 8500, hemoglobin 13.5, hematocrit 40.4, platelets  were 243,000.  Sodium 135, potassium 3.7, chloride 103, CO2 19, BUN 11,  creatinine 0.9, glucose of 87.  Coags were normal.  Liver function  studies were normal.  Amylase was 40.  Albumin 4.4.  Initial CK was 395  with 2.9 MB which was insignificant.  Followup CPK was 265 with 2.6 MB  and then 188 with 1.7 MB.  Troponins were all negative.  Cholesterol is  184, LDL of 106, HDL of 57,  triglycerides of 105.  Urinalysis was  negative.  Alcohol level was less than 5.   HOSPITAL COURSE:  The patient was admitted to telemetry monitoring.  He  was placed on IV nitroglycerin and IV heparin.  He did have resolution  of his chest pain.  He was started on statin therapy.  On April 27, 2008,  he underwent cardiac catheterization.  This demonstrated fairly stable  appearance of his coronary disease.  He had a 50% bulky stenosis in the  LAD after the first septal perforator and had fairly diffuse mid LAD  disease up to 50%.  There was a very small diagonal branch, which had an  80-90% proximal stenosis.  The first marginal branch had a 40% stenosis  and second had a 60% stenosis.  These are very small branches.  The  right coronary was a large dominant vessel and was still patent in the  previous angioplasty site.  Left ventricular function was normal.  Based  on these findings, it was felt that his coronary disease was stable.  Given his recent stressors, this is felt to have exacerbated his anginal  symptoms.  We have recommended treating him with aggressive lipid-  lowering therapy, continuing on his antianginal therapy with metoprolol  and amlodipine.  He will be continued on aspirin.  I have recommended  smoking cessation and will followup the patient in 2 weeks.   DISCHARGE DIAGNOSES:  1. Unstable angina pectoris.  2. Coronary artery disease.  3. Hypertension.  4. Mild hypercholesterolemia.  5. History of bipolar disorder and anxiety.  6. History of esophagitis and upper gastrointestinal bleeding in the      past.   DISCHARGE MEDICATIONS:  1. Aspirin 81 mg per day.  2. Metoprolol XL 50 mg per day.  3. Amlodipine 5 mg per day.  4. Wellbutrin XL 300 mg per day.  5. Crestor 20 mg per day.  6. Nitroglycerin sublingual p.r.n.   The patient may resume work in 2 days.  Avoid heavy lifting for 1 week.  He will follow up with Dr. Swaziland in 2 weeks.      ______________________________  Peter M. Swaziland, M.D.    ______________________________  Peter M. Sw[place]iland, M.D.    PMJ/MEDQ  D:  04/27/2008  T:  04/28/2008  Job:  [id number]

## 2010-06-07 NOTE — H&P (Signed)
NAME:  [name], [name]                   ACCOUNT NO.:  [id number]   MEDICAL RECORD NO.:  [id number]          PATIENT TYPE:  INP   LOCATION:  3711                         FACILITY:  [facility]   PHYSICIAN:  Georga Hacking, M.D.DATE OF BIRTH:  07/06/1950   DATE OF ADMISSION:  04/25/2008  DATE OF DISCHARGE:                              HISTORY & PHYSICAL   HISTORY:  A 60 year old male who has a history of coronary artery  disease.  He had PTCA of the posterolateral branch of the right coronary  artery in August 2007 for anginal-type symptoms.  He had some small  vessel disease also back then.  He was readmitted in November and with  acute stress and had a catheterization at that time that showed a widely  patent PTCA site of 50% LAD stenosis, 30% irregularities in the LAD, a  small intermediate branch with an 80% to 90% proximal stenosis, and a  marginal vessel, small which had an 80% to 90% stenosis proximally.  Right coronary artery was a large dominant vessel.  He was treated  medically at that time.  Since that time, he was readmitted to the  hospital in January where enzymes were unremarkable.  He has an  extensive psychiatric history.  He was readmitted in January 2008 at  which point in time, he was begun on Ranexa.  He has had extensive  visits and eventually was seen by the psychiatrist.  He has had some  alcohol dependence and substance abuse problems and has been under  psychiatric care.  He developed some melena and was consulted in October  2008, and he has had upper GI bleeding due to esophagitis in the past.  He was last hospitalized in November on the Psychiatric Service.  He  presented to the emergency room today with a complaint of increasing  substernal chest discomfort occurring over the past 3 days.  He  describes the pain as pressure or heaviness and states that it will go  away and then come back and will last for up to 15-20 minutes at a time.  It has occurred with  increasing frequency.  He presented to the [facility] Emergency Room and is currently pain-free.  He does admit to  significant situational stress and states that he is bisexual and is  currently having an affair with a married man which is causing a lot of  situational stress for him.  He has a known history of bipolar disorder.  Initial cardiac enzymes were unremarkable.   Past history is remarkable for hypertension.  He is not currently being  treated for hyperlipidemia.  He has a history of bipolar disorder.  He  has a history of upper GI bleeding with acute blood loss anemia due to  esophagitis.  He has a history of anxiety and depression and history of  polysubstance abuse.  He states that he is not currently taking any  psychiatric medications, although in December, he reportedly was on  Seroquel, Wellbutrin, and Ambien.   SURGICAL HISTORY:  1. Bilateral hip surgeries.  2. Right knee  replacement.  3. Right shoulder surgery.   ALLERGIES:  CODEINE.   CURRENT MEDICATIONS:  He states he is on metoprolol 50 mg daily,  amlodipine 5 mg daily as well as aspirin.  He does not state that he is  taking any psychiatric medications.   FAMILY HISTORY:  Father died of lung cancer.  Mother died of suicide 36  years ago.   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  He currently lives with a roommate,  states he is bisexual.  He states he had quit smoking, but he has  returned to smoking.  The old chart mentioned significant alcohol and  polysubstance abuse, although he denies this and states that he  occasionally drinks a few beers.   REVIEW OF SYSTEMS:  He has no eye, ear, nose, or throat problems.  He  does have a history of GI bleeding.  He has had some what was thought to  be some indigestion recently.  No difficulty with urination.  He does  have some mild arthritis.  Has not had any syncope other than as noted  above.   The remainder of review of systems is unremarkable.   PHYSICAL EXAMINATION:  GENERAL:  He is a middle-aged white male who  appears anxious.  VITAL SIGNS:  Blood pressure was 140/80, pulse 76.  SKIN:  Warm and dry.  ENT: EOMI.  PERLA.  CNS clear.  Fundi not examined.  Pharynx negative.  NECK:  Supple without masses, JVD, thyromegaly, or bruits.  LUNGS:  Clear.  CARDIAC:  Normal S1 and S2.  No S3 or murmur.  ABDOMEN:  Soft and nontender.  No masses, hepatosplenomegaly, or  aneurysm.  Femoral and distal pulses 2+.   EKG shows minor nonspecific ST and T wave changes.  Lipase is slightly  elevated.  Other labs, sinus rhythm, first-degree AV block, possible  previous inferior infarction.   IMPRESSION:  1. Prolonged chest discomfort in a complex patient with some features      suggestive of increasing angina.  2. Coronary artery disease with previous percutaneous transluminal      coronary angioplasty of the posterolateral branch, diffuse disease      and small vessels, otherwise.  3. History of bipolar disorder, not currently on medication.  4. Anxiety.  5. History of alcohol abuse.  6. History of upper gastrointestinal bleeding.  7. Hypertension.  8. History of several joint replacements.   RECOMMENDATIONS:  Check serial enzymes and admit.  Likely, need to  repeat catheterization since it has been a couple of years since his  previous one in light of his increasing symptoms.  Probable psychiatric  evaluation.      Georga Hacking, M.D.  Electronically Signed     WST/MEDQ  D:  04/25/2008  T:  04/26/2008  Job:  [id number]   cc:   Peter M. [place], M.D.

## 2010-06-07 NOTE — Discharge Summary (Signed)
NAME:  [name], [name]                   ACCOUNT NO.:  [id number]   MEDICAL RECORD NO.:  [id number]          PATIENT TYPE:  INP   LOCATION:  5021                         FACILITY:  [facility]   PHYSICIAN:  Harvie Junior, M.D.   DATE OF BIRTH:  1950/11/24   DATE OF ADMISSION:  02/04/2007  DATE OF DISCHARGE:  02/08/2007                               DISCHARGE SUMMARY   ADMITTING DIAGNOSES:  1. End-stage degenerative joint disease, left hip.  2. Anxiety and depression.  3. Hypertension.   DISCHARGE DIAGNOSES:  1. End-stage degenerative joint disease, left hip.  2. Anxiety and depression.  3. Hypertension.  4. Acute blood loss anemia.  5. Hypokalemia postoperatively.  6. Postoperative urinary retention.   PROCEDURES IN THE HOSPITAL:  Left total hip arthroplasty with SROM  prosthesis, Harvie Junior, M.D., February 04, 2007.   BRIEF HISTORY:  [name] is a 60 year old male who has a long history  of left hip pain that has gotten progressively worse to the point that  he has difficulty ambulating and really has minimal motion with the hip.  He was seen in the office, and x-rays confirmed severe end-stage  degenerative joint disease of the left hip with severe flattening of the  femoral head.  Based upon clinical and radiographic findings, he was  admitted for a left total hip arthroplasty.   PERTINENT LABORATORY STUDIES:  EKG from December 05, 2005 showed sinus  rhythm with first-degree AV block, otherwise normal EKG.  X-rays:  A  chest x-ray on December 05, 2006 showed no acute disease, stable  compared to prior exam.  The patient's hemoglobin on admission was 11,  hematocrit 34.6, WBC 6.4.  Indices within normal limits.  CMET on  admission showed no abnormalities.  Urinalysis showed no significant  abnormalities.  Type and screen showed O positive blood.  Protime on  admission was 13.4 seconds with an INR of 1.0, PTT was 33.  On postop  day #1, the patient's hemoglobin was 7.4, hematocrit  of 23.4.  Pro time  was 13.9 seconds, INR was 1.1.  Two units of packed RBCs were  transfused, and then on February 06, 2007, postop day #2,  hemoglobin was  9.4 with a hematocrit of 28.8.  BMET on postop day #2 showed a decreased  potassium of 3.3.  Potassium on February 07, 2007 was 4.1, hemoglobin on  this date was 9.6 with a hematocrit of 29.3, and INR was 3.0 on Coumadin  therapy.   HOSPITAL COURSE:  The patient was admitted to the hospital, where he was  given  antibiotics preoperatively per protocol and was taken to the  operating room, where he underwent left total hip arthroplasty as well  described in Dr. Luiz Blare' operative note on February 04, 2007.  Postoperatively he was still on a PCA morphine pump for pain control,  was given 3 doses of IV Ancef q.8 h, and was started on Coumadin for DVT  prophylaxis postoperatively.  Physical therapy was ordered for walker  ambulation, weightbearing as tolerated on the left.  Postoperative x-  rays  of the left hip showed excellent position of the prosthesis.  On  postop day #1, he complained of pain in his left hip.  He was taking  fluids without difficulty, had some mild nausea.  Foley catheter was  intact.  His vital signs were stable.  His hemoglobin was 7.4 and INR  was 1.1.  Two  units packed RBCs were transfused.  Foley catheter was  continued.  He was continued on a PCA and morphine pump and IV fluids.  His  blood pressure medications were held because blood pressure at that  point was 99/60.  The patient was stable.  On postop day 2, he was doing  fairly well.  His Foley catheter was still in place.  His vital signs  were stable.  He was afebrile.  His hemoglobin was up to 9.4 after 2  units packed RBCs were transfused.  His potassium was noted to be 3.3.  He was started on some oral potassium, and this was brought up to 4.1  the following day.  His IV was converted to a saline lock, and he was  continued with physical therapy.  On  postop day #3, his Foley catheter  was discontinued.  He had some difficulty voiding with some urinary  retention.  He was afebrile.  Vital signs were stable.  His left hip  wound was benign and he was intact to the left lower extremity.  He was  making good progress with physical therapy.  It was felt that he needed  an additional day of physical therapy.  He had to be in and out cathed  x2 and then was started on Flomax 0.4 mg, and this allowed him to void  without difficulty.  On February 08, 2007, the patient was progressing  well with physical therapy, he was ambulating in the hall.  His left hip  wound was benign.  His vital signs were stable.  He was afebrile.  He  was overall without complaints and was ready for discharge back to his  facility.   CONDITION ON DISCHARGE:  Improved.   DIET ON DISCHARGE:  Regular.   MEDICATIONS AT DISCHARGE:  Bupropion 300 mg 1 q.a.m., Toprol 50 mg  q.a.m., Norvasc 5 mg q.a.m., Xanax 1 mg b.i.d. p.r.n., Restoril 30 mg  q.h.s. p.r.n. sleep, Seroquel 400 mg q.h.s., Flomax 0.4 mg daily x7  days, Percocet 5 mg 1 to 2 p.o. q.4-6 h p.r.n. pain, Robaxin 750 mg 1  q.8 h p.r.n. spasm, and Coumadin 2.5 mg 1 daily, dose adjusted per  pharmacy, shooting for an INR of 2.0.  He will need to be on Coumadin x1  month postoperatively for DVT prophylaxis.   He will need his left hip dressing changed every 3 days.  This needs to  be kept dry.  He needs followup with Dr. Luiz Blare in the office in 10  days.  If he has difficulty urinating once he leaves the hospital we  will have him see a urologist.      Marshia Ly, P.A.      Harvie Junior, M.D.  Electronically Signed    JB/MEDQ  D:  02/08/2007  T:  02/08/2007  Job:  [id number]   cc:   Peter M. [place], M.D.

## 2010-06-10 NOTE — H&P (Signed)
NAME:  [name], [name]                             ACCOUNT NO.:  [id number]   MEDICAL RECORD NO.:  [id number]                   PATIENT TYPE:  INP   LOCATION:  0158                                 FACILITY:  [facility]   PHYSICIAN:  Melissa L. Ladona Ridgel, MD               DATE OF BIRTH:  [birth year]   DATE OF ADMISSION:  11/20/2002  DATE OF DISCHARGE:                                HISTORY & PHYSICAL   CHIEF COMPLAINT:  Change in mental status and chest discomfort.   HISTORY OF PRESENT ILLNESS:  This is a 60 year old white male with sketchy  information regarding his current condition.  The patient presented to the  emergency room with a friend who stated the patient was not acting himself.  The patient states that he is just having trouble with his nerves.  Earlier  in the day, he did complain of chest heaviness.  In the emergency room  initially, the patient was found to have a low blood pressure in the low  100's systolically.  He has progressed to hypotension and was placed on  dopamine at renal doses and aggressive IV hydration over the course of the  day.   PAST MEDICAL HISTORY:  1. Mitral valve prolapse.  2. A total hip replacement.  3. Ethanol abuse, currently living in a halfway home facility.   FAMILY HISTORY:  I was unable to obtain secondary to the patient's  confusion.   SOCIAL HISTORY:  By old records, he smokes approximately 1/2 pack-per-day  of cigarettes, and he is a recovering alcoholic.  At present he is confused  but denies any illicit drug use or any overdose on his current medications.   MEDICATIONS:  Xanax, Ambien, metoprolol, Inderal, and Unisom.   ALLERGIES:  No known drug allergies.   His EKG shows normal sinus rhythm with a first-degree AV block.  There are  no acute ST-T wave changes.  His laboratory values in the emergency room  revealed a white count of 14.6, hemoglobin 11.2, hematocrit 32.6, and a  platelet count of 285,000.  His BMET is remarkable for a  BUN of 34 and a  creatinine of 1.4 with a CO2 of 14.  Glucose is 99.  His LFTs were within  normal limits, and ABG obtained in the emergency room revealed a pH of 7.38,  pCO2 22, pO2 54.4, and a bicarb of 12.7.  His urine drug screen was positive  for benzodiazepines consistent with his home medications.  His urinalysis  revealed no nitrites, no leukocyte esterase.  His first set of cardiac  enzymes revealed a total of 483 and MB of 5.6, and a troponin of 0.04.   PHYSICAL EXAMINATION:  VITAL SIGNS:  He is afebrile at 97.8.  Pulse is 71.  His blood pressure at present is 78/40 on 5 mcg of dopamine.  His  respiratory rate is 16.  His saturation  initially on admission was 98% on  room air, has since decreased to the high to mid 80's on several liters of  oxygen.  GENERAL:  This is an ill-appearing, white man, who is diaphoretic and pale.  HEENT:  Pupils are equal, round, and reactive to light.  His mucous  membranes are dry.  He has a mild coating on his tongue.  Tongue is midline.  NECK:  He has no JVD, no lymph nodes, no bruits, no thyromegaly.  CHEST:  Clear to auscultation.  There is no rhonchi, rales, or wheezes.  CARDIOVASCULAR:  Regular rate and rhythm, positive S1, S2.  No S3 or S4.  Heart sounds are distant.  He has a 1/6 systolic ejection murmur at the left  sternal border.  ABDOMEN:  Soft, nontender, nondistended with positive bowel sounds.  There  is no guarding or rebound and no hepatosplenomegaly.  EXTREMITIES:  Diaphoresis with 2+ pulses, DT, radially.  NEUROLOGIC:  The patient has slurred, thick speech.  His cranial nerves  appear to be intact otherwise.  Power is 4/5 and is symmetrical.  His  plantars are downward going.  It is difficult to examine him neurologically  secondary to his inability to follow full commands.   ASSESSMENT AND PLAN:  This is a 59 year old white male with unknown source  of hypotension, bradycardia, and change in mental status.  1. Cardiovascular.   The patient complained earlier of chest discomfort.     First set of enzymes have been negative.  This is very suspicious for     myocardial infarction versus pulmonary embolus versus sepsis versus     overdose.  A critical care consult has been obtained.  Serial enzymes     will be sent.  Pressor support will be titrated to keep a MAP of greater     than 60, and aggressive hydration will be continued.  2. Infectious disease.  We cannot rule out sepsis at this time.  The patient     received Rocephin 1 g in the emergency room.  Plan is to change this to     imipenem to cover sepsis of unknown origin.  Cultures x 2 sets are     pending.  Further antibiotic coverage will be discussed with critical     care.  3. Pulmonary.  An ABG has been checked which shows an isolated hypoxemia.     At this time, we cannot CT his chest secondary to increased creatinine.     O2 support will be offered, and a D-dimer will be sent.  4. Neurologically. The patient has slurred speech, but this does not appear     to be a focal neurological deficit, likely secondary to dehydration and     his delirium.  Foley was placed to gravity, and he is making good urine     even despite the current blood pressure readings.  Old records have been     obtained which reveal no contributory information that will assist [place] in     his care at this time.  The patient will be sent to the ICU for     supportive therapy while we attempt to understand the source of his     disease.  Melissa L. Ladona Ridgel, MD    MLT/MEDQ  D:  11/21/2002  T:  11/21/2002  Job:  [id number]

## 2010-06-10 NOTE — H&P (Signed)
NAMEBRIANA, [name]                   ACCOUNT NO.:  [id number]   MEDICAL RECORD NO.:  [id number]          PATIENT TYPE:  INP   LOCATION:  2041                         FACILITY:  [facility]   PHYSICIAN:  Colleen Can. Deborah Chalk, M.D.DATE OF BIRTH:  [birth year]   DATE OF ADMISSION:  02/16/2006  DATE OF DISCHARGE:                              HISTORY & PHYSICAL   [name] is a 60 year old white male.  He is a non married Airline pilot.  He is gay.  he smokes four to five cigarettes per day.  He has an  occasional beer.   HISTORY OF PRESENT ILLNESS:  Today at approximately 3 p.m., he awoke  from a nap and had the onset of substernal chest tightness and  heaviness.  He took nitroglycerin and Toprol and tried to go for a short  walk with no relief.  He had a light lunch.  Because of persistence, he  was brought to the emergency room for evaluation.  He had previous PCI  in August 2007 of posterior lateral branch of his right coronary artery.  He has had followup catheterization as well as multiple admissions for  chest pain, the last being on January 24, 2006.  He had catheterization  in November 2007 which showed moderate disease in the left anterior  descending.  The intermediate branch was small with an 80 to 90%  stenosis proximally.  The left circumflex gives rise to two marginal  vessels.  The second OM had an 80 to 90% stenosis.  At the site of the  previous angioplasty, the posterior lateral branch was a 10 to 20%  narrowing at that point in time.  He had normal left ventricular  function.  He was admitted again in January of 2008.  He was discharged  on his current medications.   His current medications include:  1. Coated aspirin 325 mg a day.  2. Plavix 75 mg a day.  3. Norvasc 2.5 mg a day.  4. Toprol XL 50 mg a day.  5. Nitroglycerin 0.4 mg sublingual p.r.n.  6. Restoril 30 mg at h.s.  7. Ativan 1 mg q.8h. p.r.n.  8. Lipitor 10 mg per day.   PAST MEDICAL HISTORY:  He has had right  shoulder replacement, right  patellar reconstruction, and right hip replacement.   ALLERGIES:  Codeine.   Other medical problems include depression, bipolar disorder, and a  history of a seizure disorder.   FAMILY HISTORY:  Unremarkable.   REVIEW OF SYSTEMS:  Currently notes that he is not under any particular  stress.  Review of systems is reasonably unremarkable otherwise.   PHYSICAL EXAMINATION:  He is a pleasant white male.  Weight was not obtained.  Blood pressure is 142/97, heart rate is 95.  HEENT:  Negative.  NECK:  Supple, no jugular venous distention, no carotid bruits.  LUNGS:  Clear.  HEART:  Regular rate and rhythm.  ABDOMEN:  Soft, nontender.  EXTREMITIES:  Without edema.   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   OBJECTIVE:  1. Chest pain, rule out myocardial infarction.  2. Status post PCI to posterolateral branch of the right coronary      artery in August 2007 with residual coronary atherosclerosis as      described above.  3. Cigarette abuse, ongoing.  4. Hyperlipidemia.  5. Anxiety disorder with bipolar disorder.   PLAN:  Will admit to telemetry.  Will rule out myocardial infarction.  Will have him on IV nitroglycerin and IV heparin initially.      Colleen Can. Deborah Chalk, M.D.  Electronically Signed     SNT/MEDQ  D:  02/16/2006  T:  02/17/2006  Job:  [id number]   cc:   Peter M. [place], M.D.

## 2010-06-10 NOTE — Consult Note (Signed)
NAME:  MERLEN, GURRY                   ACCOUNT NO.:  [id number]   MEDICAL RECORD NO.:  [id number]          PATIENT TYPE:  INP   LOCATION:  6525                         FACILITY:  [facility]   PHYSICIAN:  Currie Paris, M.D.DATE OF BIRTH:  04-22-1950   DATE OF CONSULTATION:  DATE OF DISCHARGE:  09/01/2005                                   CONSULTATION   REASON FOR CONSULTATION:  Abscess.   HISTORY OF PRESENT ILLNESS:  Mr. Downie is a 60 year old gentleman seen  immediately following a cardiac catheterization.  When he was admitted he  was found to have an infected epidermal cyst to the left axilla and we were  asked to see him for this.   The patient notes that he has never really had any trouble with this area or  any other similar infections.  The area has gotten progressively more  painful over the last few day.  It has become much more red and warm.   PHYSICAL EXAMINATION:  The patient was seen in the post cath area.  He had  an area of erythema measuring about 6 cm with an area of fluctuant to about  3.5 with some central-developing ulceration.  This is all consistent with a  localized abscess.  After discussion with the patient, we elected to proceed  with a drainage the next day given that he was still on full heparin  anticoagulation following his cath.      Currie Paris, M.D.  Electronically Signed     CJS/MEDQ  D:  09/01/2005  T:  09/02/2005  Job:  [id number]   cc:   Hettie Holstein, D.O.

## 2010-06-10 NOTE — H&P (Signed)
NAME:  [name], [name]                   ACCOUNT NO.:  [id number]   MEDICAL RECORD NO.:  [id number]          PATIENT TYPE:  INP   LOCATION:  3728                         FACILITY:  [facility]   PHYSICIAN:  Georga Hacking, M.D.DATE OF BIRTH:  Jan 08, 1951   DATE OF ADMISSION:  12/10/2005  DATE OF DISCHARGE:                              HISTORY & PHYSICAL   REASON FOR ADMISSION:  Chest pain.   HISTORY:  This is a 60 year old male who has a previous history of  coronary artery disease.  He had angioplasty to the posterolateral  branch of the right coronary artery in August 2007.  He has a history of  depression, bipolar disorder, and had been admitted in September 2007 on  the psychiatric service for major depression.  He then had altered  mental status on November 08, 2005, had mildly elevated troponin that was  very symptomatic.  He had acute situational stress with admission  November 4 and then was admitted from November 12 to November 13. During  that admission, he had a repeat catheterization showing the previous  PTCA thought to be widely patent.  He had a severe stenosis in a small  second marginal branch and a small intermediate branch that was felt to  be too small for intervention.  He was discharged home 5 days ago.  He  has been under situational stress with his homosexual boyfriend, and he  found it difficult to distinguish the chest pain from anxiety.  He takes  Ativan frequently to control chest pain.   He awoke this morning at 4 a.m. with substernal chest discomfort and  eventually went to the [facility] Emergency Room later today.  A  myocardial infarction was ruled out with negative enzymes, and he was  begun on IV nitroglycerin and placed on heparin.  He was transferred to  [facility] for further evaluation.  He is not currently having  any pain.  He describes the pain as pressure pain, substernal, not  related to exertion, tends to come and go.   PAST MEDICAL  HISTORY:  Remarkable for:  1. History of seizures.  2. History of hyperlipidemia.  3. History of bipolar disorder and depression.   PAST SURGICAL HISTORY:  1. Previous right shoulder replacement.  2. Previous hip replacement.  3. Previous patella reconstruction.  4. Previous I&D of infected subaxillary cyst.   CURRENT MEDICATIONS:  1. Coated aspirin 325 mg daily.  2. Toprol XL 50 mg daily.  3. Lipitor 10 mg daily.  4. Wellbutrin XL 300 mg a day.  5. Ativan 1 mg q. 6 h p.r.n.  6. Nitroglycerin p.r.n.   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  He has a  boyfriend and is of the homosexual lifestyle, although he is having  difficulty with this and may be breaking up at the present time.  He  smokes less than one-half of a  pack of cigarettes per day now.  He has  a previous history of alcohol abuse and alcoholism.   FAMILY HISTORY:  Quoted in old records.   REVIEW OF SYSTEMS:  He has been anxious and tends to sweat.  He says he  had a prior diagnosis of mitral valve prolapse.  He has had some  moderate indigestion in the past.  He had some episodic arthritis.  Other than as noted above, the remainder of the Review of Systems is  unremarkable.   PHYSICAL EXAMINATION:  GENERAL:  He is an anxious-appearing, thin male  in no acute distress, appearing older than stated age.  VITAL SIGNS: Blood pressure 100/60, pulse 70.  SKIN: Warm and dry.  HEENT:  EOMI. PERRLA.  Conjunctivae and sclerae clear.  Fundi not  examined.  Pharynx negative.  NECK:  Supple without masses, JVD, thyromegaly, or bruits.  LUNGS: Clear to auscultation and percussion.  CARDIAC: Normal S1 and S2.  No S3, S4, or murmur.  ABDOMEN: Soft, nontender.  No mass, no hepatosplenomegaly.  EXTREMITIES:  Femoral and distal pulses 2+.   IMPRESSION:  1. Chest discomfort, possible unstable angina.  2. Coronary artery disease.      a.     Previous percutaneous transluminal cardiac angioplasty of        posterolateral branch in August of 2007.      b.     Residual disease involving the second marginal branch and       the intermediate branch, moderate disease in the left anterior       descending with diffusely small left anterior descending in       caliber.  3. Anxiety, history of manic depression.  4. Hyperlipidemia under treatment.  5. Cigarette abuse.  6. History of alcohol abuse.   RECOMMENDATIONS:  The patient is quite complex.  He was admitted just 5  days after having been discharged previously.  He will be placed back on  heparin.  He will have Plavix added to his regimen, and he will have  Protonix added to his regimen.  He has a previous history of some peptic  ulcer disease with active ulcers noted previously by endoscopy in 2003.      W. Viann Fish, M.D.  Electronically Signed     WST/MEDQ  D:  12/10/2005  T:  12/10/2005  Job:  [id number]   cc:   Peter M. [place], M.D.

## 2010-06-10 NOTE — H&P (Signed)
NAME:  Fick, Macallan                   ACCOUNT NO.:  [id number]   MEDICAL RECORD NO.:  [id number]          PATIENT TYPE:  INP   LOCATION:  4732                         FACILITY:  [facility]   PHYSICIAN:  Kela Millin, M.D.DATE OF BIRTH:  1950-03-18   DATE OF ADMISSION:  11/08/2005  DATE OF DISCHARGE:                                HISTORY & PHYSICAL   PRIMARY CARE PHYSICIAN:  Unassigned.   CARDIOLOGIST:  Dr. Swaziland.   CHIEF COMPLAINT:  Confusion, per ER physician.   HISTORY OF PRESENT ILLNESS:  The patient is a 60 year old white male with  past medical history significant for coronary artery disease, status post  angioplasty in August of 2002 by Dr. Swaziland, depression and bipolar  disorder, last admitted to [facility] in September of 2007, history  of alcohol abuse and states that he has not had any alcohol for one year and  nine months, and he presents after he was picked up by the police because he  was driving down the wrong way on the streets.  The patient states that he  had taken some Tylenol PM as well as Motrin PM just before he got in the car  and started driving, although per ER records, he initially stated that he  had taken 3 Ambiens on the a.m. of admission.  The patient also admits to  having chest pain before he started driving, described as midsternal and  associated with nausea, but no vomiting.  He denies radiation.  No  diaphoresis.  He denies any suicidal or homicidal ideations.  He denies any  focal weakness and no dysphagia, also denies melena, hematochezia.  No  dysuria.  The patient was seen in the ER and a CT scan of his head was done  and it showed no acute findings and a chest x-ray showed prominence and  indistinctness of pulmonary markings, question of atypical pneumonia.  His  white cell count was noted to be elevated at 16, his alcohol level less than  5 and point-of-care markers 0.09 x2.  He is admitted to the [facility] for  further evaluation and management.   PAST MEDICAL HISTORY:  1. As stated above.  2. Remote history of seizures -- 11 years ago, currently on no      medications.  3. History of mitral valve prolapse.  4. Status post right shoulder replacement.  5. Status post hip replacement in 2003.  6. Status post patellar reconstruction by Dr. Ranell Patrick.  7. Status post I&D of an infected left axillary cyst.   MEDICATIONS:  At the time he was last discharged from [facility] in  September:  1. Seroquel 200 mg p.o. nightly.  2. Zocor 20 mg daily.  3. Ambien 10 mg nightly p.r.n.  4. Toprol-XL 25 mg daily.  5. Aspirin 325 mg.  6. Wellbutrin 300 mg daily.   SOCIAL HISTORY:  Positive for tobacco.  He states he has not had any alcohol  in one year and nine months, as above.   FAMILY HISTORY:  His aunt is diabetic.   REVIEW  OF SYSTEMS:  As per HPI, other review of systems negative.   PHYSICAL EXAMINATION:  GENERAL:  The patient is a middle-aged white male.  He is alert.  He is oriented x2.  He is slow to give responses.  VITAL SIGNS:  Temperature 98.8, blood pressure 152/101 initially, 140/95 and  his pulse 79, respiratory rate 14 and his O2 SAT 90%.  HEENT:  PERRL.  EOMI.  Normocephalic, atraumatic.  Moist mucous membranes.  No oral exudates.  NECK:  Supple, no adenopathy and no thyromegaly.  No JVD appreciated.  LUNGS:  Decreased air movement bilaterally, no crackles or wheezes.  CARDIOVASCULAR:  Regular rate and rhythm, normal S1 and S2.  ABDOMEN:  Soft.  Bowel sounds present.  Nontender and non-distended.  No  organomegaly and no masses palpable.  EXTREMITIES:  No cyanosis and no edema.  NEUROLOGIC:  He is alert, oriented x 2.  He is slow to give responses and  the strength in his right upper extremity is 4+/5 and also 4+/5 in his right  lower extremity.  Strength in his left upper and lower extremities is 5/5.  Sensory grossly intact.   IMAGING STUDY:  CT scan of his head:  No acute  intracranial findings.   His chest x-ray shows prominence and indistinctiveness of his pulmonary  markings, edema versus atypical pneumonia.   LABORATORY DATA:  His sodium is 144 with a potassium of 3.4.  Chloride is  109, BUN is 10, glucose 81.  The pH is 7.43, PCO2 29.  His creatinine is  1.4.  Urine drug screen is positive for benzodiazepines, otherwise negative.  His white cell count is 16.3 with hemoglobin of 11.7, hematocrit of 36.3,  platelet count 261,000.  His alcohol level is less than 5, his troponin 0.09  and the second troponin 0.09 as well.  A brain natriuretic peptide is  pending.   ASSESSMENT AND PLAN:  1. Altered mental status -- neurological versus infection.  Obtain blood      and urine cultures, start empiric antibiotics.  We will also obtain an      MRI as patient with slight right-sided weakness.  We will hold sedating      medications for now, follow.  We will also check Tylenol level as well      as liver function tests.  2. Probable atypical pneumonia, empiric antibiotics, blood cultures and      follow brain natriuretic peptide level.  3. Leukocytosis, cultures as above and empiric antibiotics, follow.  4. Coronary artery disease, status post stent/chest pain -- slightly      elevated troponin.  I have notified Dr. Elvis Coil office, spoke with Dr.      Elease Hashimoto of the patient's admission.  Will obtain serial cardiac enzymes      and follow.  Continue aspirin, Toprol and as-needed nitroglycerin.  5. Hypokalemia, replace potassium and follow.  6. History of depression/bipolar disorder, follow and resume outpatient      medications when appropriate.  7. History of alcohol.  He states he has not had any drink in one year and      nine months.  Monitor.      Kela Millin, M.D.  Electronically Signed     ACV/MEDQ  D:  11/08/2005  T:  11/09/2005  Job:  [id number]   cc:   Peter M. [place]il[place], M.D.

## 2010-06-10 NOTE — Discharge Summary (Signed)
[name], [name] NO.:  [id number]   MEDICAL RECORD NO.:  [id number]          PATIENT TYPE:  INP   LOCATION:  2041                         FACILITY:  [facility]   PHYSICIAN:  Peter M. Swaziland, M.D.  DATE OF BIRTH:  1950/12/09   DATE OF ADMISSION:  02/16/2006  DATE OF DISCHARGE:  02/19/2006                               DISCHARGE SUMMARY   HISTORY OF PRESENT ILLNESS:  [name] is a 60 year old white male with  history of coronary artery disease.  He has a history of hyperlipidemia  and tobacco abuse.  He also has a history of anxiety disorder and  bipolar disorder.  He did undergo coronary intervention in August of  2007 with balloon angioplasty, posterolateral branch of the right  coronary artery.  He did have some small vessel disease distally.  He  subsequently returned in November of  2007 and cardiac catheterization  at that time demonstrated angioplasty site was still patent.  He was  treated medically.  He was readmitted on January 25, 2006 with recurrent  chest pain of atypical nature.  He ruled out for myocardial infarction  and we recommended follow-up as an outpatient.  The patient has failed  any attempts at outpatient follow-up.  He has returned numerous times  with chest pain and appears to be seeking antianxiety  medications on  each occasion.  When he was admitted in November, he was given a limited  prescription for Vistaril and then in January, this month, he was given  a 15 tablets supply of Restoril and Ativan. On this admission, he is  requesting Risperdal.  The patient did have a long episode of chest pain  on Friday evening and presented to the emergency department.  He was  admitted for further evaluation.  For details his past medical history,  social history, family history and physical exam, please see admission  history and physical.   LABORATORY EVALUATION:  His ECG showed no acute changes.  White count  was 9100, hemoglobin 11.2,  hematocrit 35.2, platelets 227,000. Sodium  139, potassium 3.8, chloride 109, CO2 21, BUN 18, creatinine 1.2,  glucose 105, calcium 9.0.  Serial cardiac enzymes including CPK MB's and  troponin's were all negative.  TSH was normal 0.781.  Chest x-ray showed  no active disease.   HOSPITAL COURSE:  The patient was admitted.  He was placed on IV heparin  and nitroglycerin.  His Norvasc dose was increased and he was started on  Ranexa.  He had no subsequent chest pain, no arrhythmias and was feeling  well. It was felt, based on his prior evaluation, that this was not an  acute ischemic event and we recommended discharge to home, with close  follow-up as an outpatient, and consider a stress Cardiolite study.  Again, the patient was requesting antianxiety medications, but I refused  to fill these for him and recommended that he follow up with [facility] for any psychiatric related medications.  The patient was  discharged home on February 19, 2006 in stable condition.   DISCHARGE DIAGNOSES:  1. Chest pain, atypical.  2. Coronary artery disease, status post angioplasty of the      posterolateral branch of the right coronary artery in August of      2007.  3. Hyperlipidemia.  4. Bipolar disorder.  5. Anxiety disorder.   DISCHARGE MEDICATIONS:  1. Coated aspirin 325 mg daily.  2. Plavix 75 mg daily.  3. Norvasc 2.5 mg daily.  4. Toprol XL 50 mg per day.  5. Nitroglycerin sublingual p.r.n.  6. Lipitor 10 mg per day.  7. Ranexa 500 mg per day.   FOLLOWUP:  My office will call the patient to schedule an outpatient  stress Cardiolite study.  Recommend referral to [facility]  for his anxiety issues.           ______________________________  Peter M. Sw[place]iland, M.D.     PMJ/MEDQ  D:  02/19/2006  T:  02/19/2006  Job:  [id number]

## 2010-06-10 NOTE — Discharge Summary (Signed)
[name], [name] NO.:  [id number]   MEDICAL RECORD NO.:  [id number]          PATIENT TYPE:  IPS   LOCATION:  0505                          FACILITY:  [facility]   PHYSICIAN:  Jasmine Pang, M.D. DATE OF BIRTH:  1950-07-09   DATE OF ADMISSION:  09/22/2005  DATE OF DISCHARGE:  09/30/2005                                 DISCHARGE SUMMARY   IDENTIFICATION:  This is a 60 year old single Caucasian male, who was  admitted on a voluntary basis.   HISTORY OF PRESENT ILLNESS:  The patient presented as a walk-in.  He  reported having been depressed for the past few months.  It has been  worsening over the past week.  He is only had 2-4 hours of sleep over the  past 4 days.  He states he has only eaten one sandwich in that time.  He  states, I feel very sad and lethargic and I don't know why.  He was shaky,  nervous, lethargic.  He acknowledges auditory hallucinations when he lies  down.  He states that within the past 3 months he has heard conversations,  singing at a distance, and he has gotten out of bed to check on then.  The  patient is well-known to [place] and has had at least 4 hospitalizations although  not recently.  His last inpatient stay on our unit was February 15-20, 2006.  He has also been an inpatient on other psychiatric units including [facility].  The patient states he was taking lithium 300 mg p.o. q.d.  and Lexapro 10 mg p.o. q.d.  For further admission information, see  psychiatric admission exam.   PHYSICAL EXAMINATION:  The patient appears older than his stated age.  He  has a cast on his right lower extremity.  Apparently this was placed a week  ago by Dr. Luiz Blare and he does have a follow-up appointment for this.  The  remainder of the physical exam was unremarkable.   ADMISSION LABORATORIES:  The patient was slightly anemic at 12.2 hemoglobin  and 38.2 hematocrit.  This has been a chronic situation.  His lithium level  was subtherapeutic  at 0.26.  His thyroid was within range at 0.884.  His  urine was positive for benzodiazepines.   HOSPITAL COURSE:  Upon admission the patient was continued on lithium 300 mg  p.o. b.i.d. and Ambien CR 12.5 mg p.o. q.h.s. p.r.n. insomnia and Lexapro 20  mg daily.  He was also started on a nicotine transdermal patch 21 mg as per  smoking cessation protocol.  On September 23, 2005, the lithium and Lexapro  were discontinued.  He was started on Wellbutrin XL 150 mg p.o. daily.  The  Ambien CR was discontinued and he was started on regular Ambien 10 mg p.o.  q.h.s. p.r.n. insomnia.  He was also started on Seroquel 100 mg p.o. p.r.n.  anxiety attacks and Toprol XL 25 mg daily (which was a home medication).  He  was started on his other home medication of aspirin 325 mg p.o. daily.  On  September 23, 2005, the patient was also started on his home medication of  Zocor 20 mg p.o. daily and nitroglycerin 0.4 mg sublingual as needed for  chest pain as directed.  He was started on Tylenol 650 mg p.o. q.4h. p.r.n.  On September 24, 2005, the patient was started on Librium 25 mg q.6h. p.r.n.  withdrawal or symptoms of anxiety x48 hours.  On September 27, 2005, the  patient's h.s. Seroquel was increased to 200 mg p.o. q.h.s.  The patient  tolerated his medications well with no significant side effects.   Initially the patient was endorsing the hallucinations.  His lithium level  was low and he had apparently not been compliant with his medications.  On  September 25, 2005, the patient discussed his multiple orthopedic problems.  He had a recent fracture of his right foot, recent replacement of his  shoulder, right repair of broken kneecap, the latter occurs when drinking.  He states that he was clean for a few years and 6 months and then began to  have panic attacks.  He craved alcohol but came here.  He was not sleeping  and was not eating.  On September 26, 2005, the patient was up and walking  about.  He  was complaining of pain in his right leg (broken foot).  He could  not sleep the night before.  He states, lay there and think about all the  different things that are going on in his life.  He talked about being an  Tree surgeon.  His eating was good.  He talked about his mother, who had bipolar  disorder.  He was very attached to her.  On September 27, 2005, the patient  was pleasant and relaxed.  He had just begun the Wellbutrin trial, was  tolerating this well.  He continues to report broken sleep, and the Seroquel  was increased to 200 mg p.o. q.h.s.  On September 28, 2005, the patient  states that he felt better.  He slept better with the increased Seroquel.  On September 29, 2005, the patient states he had a panic episode yesterday  and feels edgy today.  Otherwise he feels the Wellbutrin is helping his  mood.  He stated he felt ready for discharge the following day.   On September 30, 2005, the patient's mental status had improved markedly from  admission status.  He reports doing okay.  He felt the Wellbutrin was  beginning to be effective.  His mood was less depressed and anxious, affect  wider-range.  There was no suicidal or homicidal ideation.  No self-  injurious behavior.  There were no auditory or visual hallucinations.  No  paranoia or delusions.  Thoughts were logical and goal-directed.  Thought  content:  No predominant theme.  Cognitive was back to baseline, grossly  within normal limits.  He plans to return living with a friend.  He has a  follow-up appointment scheduled with Dr. Lang Snow at the [facility] the next week.   DISCHARGE DIAGNOSES:  Axis I:  Bipolar disorder, depressed mood, severe,  with psychosis; history of alcohol abuse.  Axis II:  None.  Axis III:  Recent angioplasty; remote history for seizure disorder; mitral  valve prolapse; history for alcohol abuse over about 18 months; status post right shoulder replacement in April 2007l patella  reconstruction and history  of hip replacement in 2003; right lower extremity cast for fracture of one  of the bones in his  heel.  Axis IV:  Moderate (psychosocial stressors, medical problems).  Axis V:  GAF upon discharge was 50.  GAF upon admission was 35.  GAF highest  past year was 65.   DISCHARGE/PLAN:  There were no specific activity level or dietary  restrictions.   DISCHARGE MEDICATIONS:  1. Zocor 20 mg p.o. daily.  2. Ambien 10 mg p.o. q.h.s. p.r.n. insomnia.  3. Seroquel 200 mg p.o. q.h.s.  4. Toprol XL 25 mg p.o. daily.  5. Aspirin 325 mg p.o. daily.  6. Wellbutrin 300 mg p.o. daily.   POST HOSPITAL CARE PLANS:  The patient has an appointment scheduled with Dr.  Lang Snow at the [facility].      Jasmine Pang, M.D.  Electronically Signed     BHS/MEDQ  D:  10/11/2005  T:  10/12/2005  Job:  [id number]

## 2010-06-10 NOTE — H&P (Signed)
NAME:  [name], [name]                             ACCOUNT NO.:  [id number]   MEDICAL RECORD NO.:  [id number]                   PATIENT TYPE:  INP   LOCATION:  0344                                 FACILITY:  [facility]   PHYSICIAN:  Mosetta Putt, M.D.                DATE OF BIRTH:  [birth year]   DATE OF ADMISSION:  01/19/2002  DATE OF DISCHARGE:                                HISTORY & PHYSICAL   CHIEF COMPLAINT:  Abdominal pain and rectal bleeding.   HISTORY OF PRESENT ILLNESS:  This 60 year old white male unassigned patient  was admitted from [facility] Emergency Room.  He had seen [name],  M.D., in the past and multiple attempts were made to reach Dr. Tiburcio [name] by  phone at work,, home, and a pager number and were unsuccessful.  About one  week prior to admission, the patient developed some chills and fever and had  some head congestion.  Those symptoms improved after about three days.  In  about two days prior to the admission, the patient developed periumbilical  and bilateral lower quadrant crampy abdominal pain, which became  increasingly severe today to the point that he had to call EMS to transport  him to the emergency room.  He had noticed dark red blood in his stools for  the last two days.  He had frequent loose stools about every two hours.  He  also had frequent dry heaves and a couple of episodes of mild vomiting.  He  had also had anorexia.  The patient admitted to heavy alcohol abuse in the  past but not for the last three years.  Since then, he has one to two beers  a week at the most, the last time being three days prior to admission.  He  denies any drug use and has not had any aspirin or NSAIDS.  He has not had  any radiation of pain into his back.   PAST MEDICAL HISTORY:  No serious illnesses.  He has a history of prolapsed  mitral valve with an arrhythmia, which sounds like PHE, which responds to a  single dose of propranolol.  He had a right hip replacement  two months ago  and has been told he needs his left hip replaced because of osteoarthritis.  He had repair of a deviated septum in 1983.   ALLERGIES:  None known.   MEDICATIONS:  Propranolol 40 mg rarely p.r.n. for palpitations.   FAMILY HISTORY:  Father died at age 42 of bone cancer.  Mother died at age  19 of an MI.  He has brothers ages 38 and 1 alive and well; no sisters or  children.   SOCIAL HISTORY:  The patient is divorced.  He lives alone.  He is employed  as a IT trainer.  He smokes a half a pack of cigarettes a day for 30 years.  Alcohol: As above.   REVIEW OF SYSTEMS:  Review of systems is negative except for the above.   PHYSICAL EXAMINATION:  GENERAL:  The patient is a frail appearing, weak,  tremulous 60 year old white male in some distress secondary to abdominal  pain.  VITAL SIGNS:  Blood pressure 124/84 supine and standing, pulse 110 supine  and 144 standing, respirations 22, temperature 98.7.  HEENT:  Pupils are equal, round, and reactive to light and accomodation.  Sclerae are somewhat injected.  Conjunctivae are normal.  Nose is clear.  Ears: Negative.  Mouth and throat: Negative except his mucous membranes are  a little dry.  NECK:  Supple without adenopathy.  Carotids are 2+ without bruits.  Thyroid  is unremarkable.  LUNGS:  Clear with diffusely decreased breath sounds.  HEART:  Regular rhythm without murmurs or gallops.  ABDOMEN:  Mildly distended.  He has moderate diffuse tenderness with no  guarding or rebound.  Bowel sounds are normal, no organomegaly or masses are  present.  EXTREMITIES:  Without edema.  Peripheral pulses are 2+.  GENITALIA:  Negative, no hernias.  RECTAL:  Done per ER P.A., was negative with Hematest negative stool.  NEUROLOGIC:  Exam was grossly intact for cranial nerves, sensory, motor, and  cerebellar function, except he is moderately tremulous.  Reflexes are 3+.  Babinski's are negative.  MENTAL STATUS EXAM:  He appears to be  moderately anxious but is alert and  oriented.  SKIN:  Negative.   LABORATORY DATA:  CBC shows a hemoglobin 12.4, hematocrit 37.4, platelets  168,000, WBC 2800 with 50 polys, 4 bands, 39 lymphs, 4 monos, and 3  eosinophils.  CMET was okay except for a sodium 131, BUN 3, SGOT 73, SGPT  44.  Amylase was normal at 34, lipase was high at 80.   IMPRESSION:  1. Abdominal pain and rectal bleeding, rule out gastroenteritis, rule out     colitis, possible pancreatitis, questionable recent alcohol abuse.  2. Tremulousness and hyperreflexia, questionably secondary to alcohol     withdrawal versus other etiology including reaction to pain or anxiety.  3. Hyponatremia, probably secondary to vomiting and diarrhea.   PLAN:  IV fluids, Dilaudid for pain since morphine in the ER was not  effective.  GI consult.  Phenergan, clear liquids.  Will get an alcohol  level and a urine drug screen.  He will be given a nicotine patch.  Will  also get and abdominopelvic CT scan with oral IV contrast.  Will Hematest  all stools.  He will be given lorazepam p.o. or IV as needed for anxiety.  Also will give Ambien for sleep, which has helped him in the past.  Will  also collect a stool for smear, C&S, and O&P.                                               Mosetta Putt, M.D.    PB/MEDQ  D:  01/19/2002  T:  01/20/2002  Job:  [id number]

## 2010-06-10 NOTE — H&P (Signed)
NAME:  Rochin, Mikias                   ACCOUNT NO.:  [id number]   MEDICAL RECORD NO.:  [id number]          PATIENT TYPE:  INP   LOCATION:  4732                         FACILITY:  [facility]   PHYSICIAN:  Kela Millin, M.D.DATE OF BIRTH:  [birth year]   DATE OF ADMISSION:  11/08/2005  DATE OF DISCHARGE:                                HISTORY & PHYSICAL   CARDIOLOGIST:  Peter M. Swaziland, MD   PRIMARY CARE PHYSICIAN:  Unassigned.   CHIEF COMPLAINT:  Confusion.   HISTORY OF PRESENT ILLNESS:  The patient is a 60 year old white male with  past medical history significant for coronary artery disease, status post  angioplasty in August 2007 per Dr. Swaziland; history of depression/bipolar  disorder, last admitted to [facility] in September; and a remote  history of alcohol abuse, states he has not   Dictation ended at this point.      Kela Millin, M.D.  Electronically Signed     ACV/MEDQ  D:  11/08/2005  T:  11/09/2005  Job:  [id number]

## 2010-06-10 NOTE — Discharge Summary (Signed)
NAME:  [name], [name]                   ACCOUNT NO.:  [id number]   MEDICAL RECORD NO.:  [id number]          PATIENT TYPE:  INP   LOCATION:  0376                         FACILITY:  [facility]   PHYSICIAN:  Lonia Blood, M.D.      DATE OF BIRTH:  1950/09/17   DATE OF ADMISSION:  02/26/2004  DATE OF DISCHARGE:  02/29/2004                                 DISCHARGE SUMMARY   PRIMARY CARE PHYSICIAN:  Dr. Juluis Mire.   DISCHARGE DIAGNOSES:  1.  Hyponatremia.  2.  Alcohol intoxication.  3.  Major depression.  4.  Transient abdominal pain.  5.  Tobacco abuse.  6.  History of degenerative joint disease.  7.  History of allergic rhinitis.   DISCHARGE MEDICATIONS:  1.  Wellbutrin 150 mg daily.  2.  Ambien 10 mg q.h.s. for sleep.  3.  Celexa as previous per patient's home dose.  4.  Multivitamins.  5.  Ativan 1 mg q.8h. p.r.n. anxiety.   DISPOSITION:  The patient is discharged in fair health with occasional  shaking.  The patient was originally scheduled to be transferred to  behavioral health, but he has done remarkably well with his symptoms of  withdrawal and is back to his baseline.  Subsequently, he will be discharged  home to follow up with Dr. Ellene Route as well as mental health where he  is already hooked up.   PROCEDURE PERFORMED:  Chest x-ray performed on February 27, 2004, that shows  no active disease.   CONSULTATIONS:  None.   BRIEF HISTORY AND PHYSICAL:  [name] is a 60 year old male with a history of  severe depression and alcohol abuse, who has been in and out of the hospital  as well as behavior health for the same problems.  He was recently seen by  mental health and was started on Wellbutrin for his major depressive  disorder.  His last admission here was in November 2005.  He has done well  avoiding alcohol until recently.  He came in secondary to major depression  as well as severe alcohol intake recently.  The patient came in feeling  lethargic and weak.  His  alcohol level on arrival was 144.  His urine drug  screen was also positive for some benzodiazepines and amphetamines.  He was  subsequently reviewed in the emergency room by behavioral health, however,  his sodium was so low at 120 that they declined to take the patient in until  he was medically cleared.  The patient was admitted for management of his  hyponatremia as well as alcohol intoxication and withdrawal.   HOSPITAL COURSE:  1.  Hyponatremia.  As indicated, the patient's admission sodium was 120.  He      was admitted, started on IV normal saline and observation.  His sodium      steadily rose up gradually from 120 to 129 after the first day and,      subsequently, 133 and 135 on the date of discharge.  The cause of      hyponatremia was not clear.  The patient said he has had some diarrhea.      He has also been taking lots of fluid as well as salts.  It is not clear      if this is a case of possible SIADH in the beginning especially with a      history of tobacco smoke.  His chest x-ray was therefore taken, which      shows no active disease, no evidence of any lung mass.  The fact that      patient responded very well to fluids showed that this may be just      simply dehydration although he is not on any diuretic.  It could also be      secondary to his diarrhea.  The patient is, therefore, being discharged      with normal sodium at this point.   1.  Alcohol intoxication.  The patient has the history of abuse and      intoxication.  He was started on DT precautions as well as withdrawal      protocol with Ativan.  He has been on Ativan before and patient      responded very well.  He has episodes of shaking, but no frank      agitation.  He is being discharged on Ativan p.r.n. at this point.   1.  Depression.  The patient has just recently started Wellbutrin as well as      Celexa.  He is being continued on his home medicines and he will follow      up with his  psychiatrist after discharge.   1.  Abdominal pain.  The patient complained of initial abdominal discomfort,      which resolved by the second day of admission.  Amylase was checked,      which was 26.  Lipase was 32.  The conclusion was that this may be some      mild gastritis from alcoholism.  The patient was on PPI, Protonix,      during this hospitalization and he did very well.   1.  Insomnia.  The patient has been taking Ambien at home for sleep, which      was continued.   1.  Tobacco abuse.  The patient was also counseled on tobacco use.  He still      continues to smoke, although in the hospital, he had some nicotine patch      and has not smoked at all.      LG/MEDQ  D:  02/29/2004  T:  02/29/2004  Job:  [id number]

## 2010-06-10 NOTE — H&P (Signed)
[name], [name]                   ACCOUNT NO.:  [id number]   MEDICAL RECORD NO.:  [id number]          PATIENT TYPE:  IPS   LOCATION:  0508                          FACILITY:  [facility]   PHYSICIAN:  Geoffery Lyons, M.D.      DATE OF BIRTH:  21-Jul-1950   DATE OF ADMISSION:  03/09/2004  DATE OF DISCHARGE:                         PSYCHIATRIC ADMISSION ASSESSMENT   IDENTIFYING INFORMATION:  This is a 60 year old white male who is divorced.  This is a voluntary admission.   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  Cannot cite any specific triggers for his relapse.  Presented in  the emergency room complaining that he had been drinking all day.  Had not  been able to stop drinking.  Alcohol level was 176.  Also said that he had  been smoking cocaine all day but his urine drug screen was negative and  today he denies any other substance abuse other than the alcohol.  Did use  some cocaine last time about a month ago but is not using it regularly.  Sober after his last discharge in late January here.  Says he was sober 2-3  weeks after discharge on January 26th.  However, the record reflects that he  was admitted February 3rd, inebriated at that time, to his primary care  physician, was admitted for alcohol abuse and hyponatremia.  Discharged  February 6th.  The patient reports he had been unable to stop drinking and  was feeling shaky, sweaty, anxious and agitated when he presented to the  emergency room.  His urine drug screen was positive for benzodiazepines.  He  denies any suicidal thoughts.   PAST PSYCHIATRIC HISTORY:  The patient has been referred to Dr. Wynonia Lawman but  has not seen him yet.  This is the fourth or fifth admission to [facility] with his last one being January 19 to February 18, 2004.  The patient has had three detox admissions here in approximately  40 days.  Last admission was actually to the medicine service February 3rd to the 6th  for alcohol abuse and hyponatremia.  He was discharged at that time on  Celexa 20 mg, Wellbutrin 150 mg XL daily, Ambien 10 mg q.h.s. and Ativan 1  mg q.8h. p.r.n. for anxiety.  The patient does have a past history of  suicide attempt by salicylate overdose several years ago.  Has a history of  depression with agitation.   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  Lives alone and is divorced from his previous wife with no  children.   FAMILY HISTORY:  Remarkable for history of alcohol and drug use on both  sides of his parents family.   ALCOHOL/DRUG HISTORY:  The patient has a long history of alcohol dependence  and abuse with multiple detox admissions here at [facility] and also some  intermittent history of cocaine abuse.   PRIMARY CARE PHYSICIAN:  The patient is followed by Dr. Ellene Route here  in [place], who is his regular primary care physician.  Also Dr. Dorene Grebe is his orthopedist.  The patient is having some chronic right shoulder  pain due to degenerative arthritis.   MEDICAL PROBLEMS:  Some chronic right shoulder pain.  Has a past history of  gastritis, anemia, salicylate overdose, depression with agitation and prior  suicide attempts.   MEDICATIONS:  Wellbutrin XL 150 mg daily, Celexa (dose unclear) 10-20 mg  daily, Ambien (says he is taking 20 mg at h.s.), was taking some Ativan at  one point but denies that he has had any in the past week or two.   ALLERGIES:  CODEINE (which causes itching).   POSITIVE PHYSICAL FINDINGS:  The patient's full physical examination was  done in the emergency room and is generally unremarkable.  He did present  there with some chest pain.  Had a negative workup at that time.  Did  receive one tablet of nitroglycerin 0.4 mg on presentation to the unit.  He  is a well-nourished, thin but pale-appearing  white male, 5 feet 10 inches  tall, 165 pounds.  Vital signs were within normal limits.  Temperature 97,  pulse 119, respirations 18, blood pressure 127/86.  Neuro is within normal  limits, nonfocal.  Normal motor exam.  His CIWA today is less than 5.   LABORATORY DATA:  His urine drug screen was positive for benzodiazepines.  Hemoglobin 12.6, hematocrit 39.0, white count essentially normal at 10,100,  platelets normal at 348,000.  His BMET is within normal limits.  Electrolytes normal.  BUN 7, creatinine 0.9, glucose 86 on a random sample.  Liver enzymes are within normal limits.  SGOT 21, SGPT 23.  Cardiac workup  was negative.  TSH normal at 1.819.  Alcohol level in the emergency room  176.   MENTAL STATUS EXAM:  This is a fully alert male who is appropriate and  cooperative, evasive in terms of the discussion.  He wants to change the  subject and talk about a variety of things such as focusing on medication  doses, dates when he was here last, interested in talking about the chronic  ache in his shoulder, not able to described his relapses or how he is  working the program.  Says that he had been going to AA daily even while he  had relapsed.  Avoids discussion of key factors and factors that he plans to  change to keep himself sober.  Speech is normal in pace, tone and amount.  Mood is a bit depressed, somewhat defensive.  Thought process reveals strong  denial of the role of substances in his life.  Insight unclear.  No clear  suicidal ideation, which he denies today.  No homicidal ideation.  Some  agitation in thought from time to time from what he describes, though he did  not display that during interview.  Cognitively, he is intact and oriented x  3.  Judgment and impulse control within normal limits.   DIAGNOSES:   AXIS I:  1.  Alcohol dependence.  2.  Cocaine abuse, in partial remission. 3.  Depressive disorder not otherwise specified.   AXIS II:  No diagnosis.   AXIS  III:  Degenerative joint disease by history.   AXIS IV:  Deferred.   AXIS V:  Current 30; past year 55-60.   PLAN:  Voluntarily admit the patient to give him a safe detox.  Restarted  him on a Librium protocol.  We are going to continue his Wellbutrin XL 150  mg daily and our casemanager is looking into placing him in a 30-day  program, which he has requested in order to stay clean and sober.  We will  not restart the Wellbutrin and have discussed this  with him.  However, we will put him back on Seroquel, which he has  successfully used in the past, 100 mg q.h.s., which he may repeat if his  thought agitation is keeping him from sleeping and 100 mg q.6h. p.r.n. for  agitation.   ESTIMATED LENGTH OF STAY:  Three days.      MAS/MEDQ  D:  03/10/2004  T:  03/10/2004  Job:  [id number]

## 2010-06-10 NOTE — Discharge Summary (Signed)
NAMEFOWLER, [name]                               ACCOUNT NO.:  [id number]   MEDICAL RECORD NO.:  [id number]                   PATIENT TYPE:   LOCATION:                                       FACILITY:   PHYSICIAN:  Ranelle Oyster, M.D.             DATE OF BIRTH:  01-09-1951   DATE OF ADMISSION:  11/07/2001  DATE OF DISCHARGE:  11/14/2001                                 DISCHARGE SUMMARY   DISCHARGE DIAGNOSES:  1. 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  2. History of gastroesophageal reflux disease.  3. Tobacco abuse.  4. Anemia.  5. Mild hyponatremia.   HISTORY OF PRESENT ILLNESS:  The patient is a 60 year old white male  admitted on 11/05/01, with progressive right hip pain, septic necrosis of  the bilateral hips.  X-ray of the right hip revealed with complete collapse  with migration of the femoral neck.  The patient underwent a right total hip  replacement on 11/05/01, by Dr. Darrelyn Hillock.  The patient was placed on Coumadin  for deep venous thrombosis prophylaxis.  Physical therapy report at this  time indicates that the patient is ambulating minimal assist with standard  walker with step-down weightbearing.  Transfer minimal assist.  Postoperative complications were significant for mild hyponatremia and  anemia status post transfusion of packed red blood cells.  The patient was  transferred to [facility] on 11/06/01.   PAST MEDICAL HISTORY:  1. Pancreatitis.  2. Hypertension.  3. Reflux.  4. Tobacco abuse.  5. Alcohol abuse.  6. Tachycardia.  7. Pneumothorax.  8. Questionable seizures.   PAST SURGICAL HISTORY:  Rhinoplasty in 35.   PRIMARY CARE PHYSICIAN:  Juluis Mire, M.D.   FAMILY HISTORY:  Noncontributory.   SOCIAL HISTORY:  The patient lives alone in an apartment on the first floor  in [place].  Five steps to entry.  He is divorced, he has no children.  Ambulating with or without cane prior to admission.   Has good system support  of local friends and family.  Denies alcohol or tobacco abuse.  He is an  Airline pilot.   HOSPITAL COURSE:  The patient was admitted to [facility] Rehabilitation  Department on 11/07/01, for a comprehensive inpatient rehabilitation where  he received more then three hours of physical therapy and occupational  therapy exercise daily.  Overall, the patient did very well during his six  day stay in rehabilitation.  He was made modified independent prior to  discharge.  The patient was able to maintain his weightbearing status, and  able to tolerate therapy very well.  The patient remained on Coumadin, as  well as Lovenox until Coumadin was therapeutic for deep venous thrombosis  prophylaxis.  The patient had no bleeding complications.  His hospital  course was significant for anemia and mild hyponatremia.  The patient had an  admission hemoglobin of 8.4  performed on 11/07/01.  Repeat hemoglobin was  performed on 11/11/01 which was 7.9.  Therefore, the patient was transfused  2 units of packed red blood cells on 11/11/01.  Post transfusion hemoglobin  was 10.5.  The patient remained on Trinsicon p.o. b.i.d. throughout the  entire stay in rehabilitation.  His pain was controlled with Darvocet-N.  The patient also had an admission sodium level of 131.  Repeat sodium was  performed on 11/11/01, was 132.  There was no other significant medical  issues while the patient was hospitalized.   LABORATORY DATA:  Recent labs indicated that his latest INR was 2.1.  Latest  hemoglobin was 10.5.  Hematocrit 31.9.  White blood cell count 6.9, platelet  count 380.  Latest sodium level was 132, potassium 3.7, chloride 99, CO2 26,  glucose 9, BUN 5, creatinine 0.7.  AST 16, ALT 14.  He had an urine culture  performed on 11/07/01, which demonstrated 75 colonies mostly species present  without uropathogens isolated.   At the time of discharge all vitals were stable.  The patient was  afebrile.  Right hip still has staples.  No drainage or redness, but 1 to 2+ edema.  The patient was discharged home.  The patient is ambulating modified  independently greater than 100 feet.  Can perform most all activities of  daily living modified independently.  The patient was discharged home with  his friend.   DISCHARGE MEDICATIONS:  1. Lopressor 25 mg one tab b.i.d.  2. Trinsicon one tablet b.i.d.  3. Coumadin three tabs until 12/06/01.  4. Darvocet-N 100 one or two tablets q.4-6h. p.r.n. pain.  No aspirin,     ibuprofen, or Aleve while on Coumadin.  5. Ambien 5 to 10 mg one or two tabs p.r.n. sleep.  6. Okay for Tylenol, do not exceed 4000 mg of Tylenol daily.   Pain management with Darvocet.   ACTIVITY:  Touchdown weightbearing, use walker, no drinking, no driving.  Observe hip precautions.  No smoking.   FOLLOWUP:  1. Staples removed at Dr. Jeannetta Ellis early next week.  2. [facility] for physical therapy and occupational therapy,     first on Friday, 11/15/01.  3. Dr. Riley Kill p.r.n.  4. Follow up with his primary care physician, Dr. Tiburcio Pea, to monitor his     sodium and anemia.       Junie Bame, P.A.                       Ranelle Oyster, M.D.    LH/MEDQ  D:  11/13/2001  T:  11/13/2001  Job:  [id number]   cc:   Windy Fast A. Darrelyn Hillock, M.D.   Juluis Mire, M.D.

## 2010-06-10 NOTE — Discharge Summary (Signed)
NAME:  [name], [name]                   ACCOUNT NO.:  [id number]   MEDICAL RECORD NO.:  [id number]          PATIENT TYPE:  IPS   LOCATION:  0501                          FACILITY:  [facility]   PHYSICIAN:  Geoffery Lyons, M.D.      DATE OF BIRTH:  1950/04/08   DATE OF ADMISSION:  12/06/2006  DATE OF DISCHARGE:  12/10/2006                               DISCHARGE SUMMARY   CHIEF COMPLAINT AND PRESENT ILLNESS:  This was one of multiple  admissions to [facility] for this 60 year old single  white male.  Relapsed on alcohol.  Not exactly sure how long he had been  drinking this time around, as it got heavier over the past week.  Drinking 10 to 12 beers or 10 to 12 glasses of wine daily for the past  week.  UDS was positive for benzodiazepines.  He had been compliant with  taking his medication as he claims.  These are prescribed for bipolar  disorder.  [facility], but cannot share  any particular triggers for his relapse.  Drinking for several weeks.  Presented with some chest tightness.  He has a history of coronary  artery disease.   PAST PSYCHIATRIC HISTORY:  Followed by Dr. Hortencia Pilar at [facility].  One of several admissions to [facility], last time September 6 to  October 04, 2006.   MEDICAL HISTORY:  1. Mitral valve prolapse.  2. Left hip pain from arthritis, being considered for left hip      replacement.  3. In September, hospitalized with esophagitis and GI bleed.   MEDICATIONS:  1. Seroquel XR 400 mg at bedtime.  2. Wellbutrin 200 mg in the morning.  3. He also takes Protonix 40 mg daily.   GI exam performed; failed to show any acute findings.   LABORATORY WORKUP:  Alcohol 133.  CBC:  Platelets 36,000, mean  corpuscular volume 81.5.  SGOT 40, SGPT 15.  UDS positive for  benzodiazepines.   MENTAL STATUS EXAM:  Reveals a fully alert, pleasant, cooperative male,  using humor upon this evaluation.  Unable to recall exactly  how long he  has been drinking this time around.  Speech was normal in pace, tone,  and production.  Mood:  Some anxiety.  Affect broad.  Thought process  logical, coherent, and relevant.  No evidence of suicidal or homicidal  ideas.  No delusions.  No hallucinations.  Cognition well-preserved.   ADMITTING DIAGNOSES:  AXIS I:  Alcohol dependence.  Rule out bipolar  disorder.  AXIS II:  No diagnosis.  AXIS III:  Coronary artery disease.  History of GI bleed.  Left hip  arthritis.  AXIS IV:  Moderate.  AXIS V:  Upon admission 40.  High GAF in the last year 70.   COURSE IN THE HOSPITAL:  He was admitted.  Started individual and group  psychotherapy.  He was detoxified with Librium.  He was placed on the  Seroquel XR 400 mg at bedtime, Wellbutrin SR 200 mg in the morning.  He  was given  trazodone; that was eventually increased to 75.  As already  stated, a 60 year old male with a long history of alcohol dependency,  most recently in September.  He had some sobriety, but once again he  felt he could handle it drinking socially.  Says he went with friends,  drank a beer; that triggered a full relapse.  Increased use with lack of  control.  This gets him to start feeling down, depressed, hopeless,  helpless.  He went to the ED with chest discomfort.  Workup was negative  for cardiac pathology.  In the ED, he endorsed depression, suicidal  thoughts, feels anxious, a sense of hopelessness and helplessness,  overwhelmed, trying to feel better.  We worked on relapse prevention.  We worked to address comorbidities.  We re-established the medications;  worked to, hopefully, improve compliance; and by November 17, he was in  full contact with reality.  There were no suicidal or homicidal ideas.  No hallucinations or delusions.  Motivated to pursue further treatment.  Endorsed he was ready for long-term abstinence.  Will create a structure  with __________ and work on Pharmacologist.  He was approved  for  disability which helped ease his financial stress, as well as he is  working some as an Airline pilot.   DISCHARGE DIAGNOSES:  AXIS I:  Alcohol dependence.  Bipolar disorder,  depressed.  AXIS II:  No diagnosis.  AXIS III:  Coronary artery disease.  Status-post gastrointestinal bleed.  Left hip arthritis.  AXIS IV:  Moderate.  AXIS V:  Upon discharge 50.   Discharged on Seroquel XR 400 mg at night, Wellbutrin XL 300 mg in the  morning, trazodone 50 mg 1-1/2 at bedtime.   FOLLOWUP:  Dr. Lang Snow at [facility] and [facility], psychotherapy.      Geoffery Lyons, M.D.  Electronically Signed     IL/MEDQ  D:  01/17/2007  T:  01/17/2007  Job:  [id number]

## 2010-06-10 NOTE — Discharge Summary (Signed)
NAME:  [name], [name]                             ACCOUNT NO.:  [id number]   MEDICAL RECORD NO.:  [id number]                   PATIENT TYPE:  EMS   LOCATION:  MINO                                 FACILITY:  [facility]   PHYSICIAN:  Thomas C. Wall, M.D. [facility]            DATE OF BIRTH:  [birth year]   DATE OF ADMISSION:  08/08/2001  DATE OF DISCHARGE:  08/10/2001                                 DISCHARGE SUMMARY   REASON FOR ADMISSION:  Chest pain.   DISCHARGE DIAGNOSES:  1. Chest pain, etiology unclear.  Evaluated this admission with cardiac     enzymes which were negative. Electrocardiogram was without change     suggestive of ischemia.  Adenosine Cardiolite showed no evidence of     ischemia or scar with an ejection fraction of 71%.  2. Gastric and pyloric ulcers, esophageal stricture diagnosed by upper     endoscopy this admission.  3. Alcohol abuse with elevated liver enzymes this admission.  4. Hyperlipidemia.  5. Iron deficiency anemia.  6. Hypertension.  7. Pancreatitis.  8. Mitral valve prolapse.  9. Panic attacks.  10.      Atelectasis, left lung, remote.   HISTORY OF PRESENT ILLNESS:  This 60 year old gentleman with no cardiac  history presented to the ED on day of admission complaining of five days of  chest pain.  He described a substernal pressure that was intermittent  bearing no relationship at all with activity.  There was associated left arm  radiation with some intermittent shortness of breath and diaphoresis.  The  patient also described bloody diarrheal stools and bloody emesis during the  last several days prior to admission.  Given these factors, he was admitted  for further evaluation and treatment.   HOSPITAL COURSE:  The patient was admitted, and serial cardiac enzymes were  drawn.  He was begun on aspirin, beta blocker, and pain and anxiety were  treated with Xanax and morphine.  He was seen, given hematemesis, by GI  service, and upper endoscopy was scheduled.   Meanwhile, he was empirically  treated with Protonix and Librium.  The patient experienced no further chest  pain.   He was taken on day #2 of admission for adenosine Cardiolite.  This was  negative for ischemia,and the ejection fraction was found to be above  normal.  After this, endoscopy was performed.  The patient tolerated the  procedure well.   The patient's condition improved, and he was discharged on day #3.  Hemoglobin and hematocrit improved and remained stable throughout  hospitalization.  It was decided alcoholism needed treatment intervention.  He was seen on day of discharge by Dr. Riley Kill who discussed community  health resources with him.  On physical exam on day of discharge, the  patient offered no complaints, without chest pain or dyspnea.  He denied  melena and hemoptysis.   PHYSICAL EXAMINATION:  VITAL  SIGNS:  Blood pressure 100/60, pulse 70,  respirations 20.  GENERAL:  This is an anxious, pale, disheveled, middle-aged gentleman in no  acute distress  CARDIOVASCULAR:  Regular rate and rhythm.  LUNGS:  Clear to auscultation.  EXTREMITIES:  Without edema.   LABORATORY DATA:  Hemogram: WBC 7.2, hemoglobin 10.2, hematocrit 32.6,  platelets 294.  Chemistries: Sodium 135, potassium 3.7, BUN 6, creatinine  0.7, glucose 82.  Liver chemistry: AST 42, ALT 37, alkaline phosphatase 120.  CK #1 was 153, #2 was 117, #3 was 87.  Initial MB was elevated at 5.5 then  progressed to 3.2 and 3.2.  Troponin negative for 3 sets.  Total cholesterol  181, triglycerides 64, HDL 85, LDL 83.   Adenosine Cardiolite as above.   Chest x-ray shows no active disease with mild aortic elongation.   EKG showed sinus tachycardia with rate of 100 on arrival and normal axis.  No ST-T ischemic changes.   DISPOSITION:  The patient is discharged to home.   DISCHARGE MEDICATIONS:  1. Metoprolol 25 mg 1 q.d.  2. Protonix 40 mg 1 q.d.  3. Allegra p.r.n.  4. Librium q.6h. as needed with  explicit instructions not to drive while     taking this.   SPECIAL INSTRUCTIONS:  Avoidance of alcohol.  Much discussion and much  counseling about this with Dr. Riley Kill, and the patient understands the full  range of support systems in town including Alcoholics Anonymous.  He agrees  to call the office for a followup appointment with gastrointestinal service  at [facility] and knows not to hesitate to contact us in the interim for any  problems, questions, concerns, change or increase in symptoms.      Pennelope Bracken, P.A. [facility]                   Thomas C. Wall, M.D. [facility]    LK/MEDQ  D:  10/02/2001  T:  10/02/2001  Job:  [id number]   cc:   Iva Boop, M.D. [facility]  [address] [place], [place] [address]  Fax: 1

## 2010-06-10 NOTE — Consult Note (Signed)
NAME:  [name], [name]                             ACCOUNT NO.:  [id number]   MEDICAL RECORD NO.:  [id number]                   PATIENT TYPE:  INP   LOCATION:  NA                                   FACILITY:  [facility]   PHYSICIAN:  Terrial Rhodes, M.D.             DATE OF BIRTH:  [birth year]   DATE OF CONSULTATION:  11/21/2002  DATE OF DISCHARGE:                                   CONSULTATION   REASON FOR CONSULTATION:  Salicylate toxicity and possible need for  dialysis.   HISTORY:  Mr. Lambertson is a 60 year old white male with a past medical history  significant for alcohol abuse, gastritis, pancreatitis, hepatitis, and  recent diagnosis of mitral valve prolapse who was brought to [facility] ER  with altered mental status noticed by a friend.  The patient was found to  have a metabolic acidosis, an anion gap of 14, and a salicylate level of 94,  along with his altered mental status.  He also had elevated CPKs and MBs of  43 and 5.6.  He was admitted to the ICU for aggressive IV fluid hydration  and alkalinization of his blood and urine.  He was quite hypotensive with  blood pressure 78/40, on dopamine.  His saturations were 90% initially on  room air.  We have been asked to further evaluate the patient for possible  need of dialysis given his overall clinical picture.  The patient is  currently arousable to verbal stimuli, however, is disoriented and still  somewhat confused.   ALLERGIES:  No known drug allergies.   PAST MEDICAL HISTORY:  1. History of alcohol abuse.  2. Gastritis esophagitis.  3. Pancreatitis and hepatitis due to #1.  4. Mitral valve prolapse.  5. 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  6. History of urinary retention.  7. GI bleed from alcohol abuse.  8. Coagulopathy secondary to alcohol abuse.  9. Thrombocytopenia, again secondary to #1.   FAMILY HISTORY:  Noncontributory.   SOCIAL HISTORY:  Unable to obtain from patient, though according to  medical  records he is divorced, lives alone, was employed as a IT trainer, and does drink  alcohol and does take aspirin occasionally for elbow pain.   REVIEW OF SYSTEMS:  Again, difficult to obtain because of the patient's  altered mental status.  He is currently without complaints.  Did report that  he was having some elbow pain and took some aspirins, but denied any  suicidal ideations, no suicide attempts.  No headache, chest pain, nausea,  vomiting, diarrhea.   MEDICATIONS PER RECORDS HERE:  Xanax, Ambien, Inderal, Unisom, Metoprolol.  Discharge summary from December 2003, he was on Klonopin one twice a day,  Darvocet p.r.n. pain, multivitamins, and Prilosec OTC.   PHYSICAL EXAMINATION:  GENERAL:  This is a thin man laying in bed.  In mild  to moderate respiratory distress.  VITAL SIGNS:  Temperature 99.7, pulse 80, blood pressure 85/40, respiratory  rate is 28, pulse ox is 90% on 6 L.  HEENT:  Head is normocephalic, atraumatic.  Pupils were dilated, but  responsive to light and equal, no icterus.  Oropharynx with dry mucous  membranes.  NECK:  Supple, no lymphadenopathy, no bruits, no nuchal rigidity.  LUNGS:  Had bibasilar rales 1/3 of the way up.  CARDIAC:  Regular rate and rhythm, no precordial rub.  ABDOMEN:  Normoactive bowel sounds, soft, nontender, nondistended.  EXTREMITIES:  No edema.  Are mottled and cool to touch with decreased  peripheral pulses.   LABORATORY DATA:  Sodium 138, potassium 2.6, chloride 112, CO2 21, BUN 34,  creatinine 1.6, glucose was 220, hemoglobin 9.5, white blood cell count  15.4, platelets 200.  ABG showed a pH of 7.48, pCO2 25, -pO2 59.8, bicarb  was 18 on 5 L nasal cannula.  Salicylate level was 80.  Most recent one was  86.   ASSESSMENT AND PLAN:  1. Salicylate toxicity.  The patient now has pulmonary edema following     aggressive volume replacement and pressors.  This may be aspirin-induced     lung injury.  The critical care team and primary  service has     appropriately treated the patient with bicarbonate.  His pH is now     improved to 7.48,  however, given his pulmonary edema and persistent     hypotension, the patient would best be suited to be transferred to [facility] for continuous veno-venous hemodialysis (CVVHD) with fluid     removal and continue with bicarbonate replacement and pressor support.     We will also be able to help treat his hypokalemia with the added     potassium in the dialysate bath.  The patient's condition is critical,     and will need to transfer the patient as soon as possible.  We will     increase his oxygenation to a non-rebreather.  He currently does not     require intubation and hopefully will not need to be intubate if we can     arrange for transfer.  He does have a high prediction mortality give his     current symptoms and blood levels, and we will continue to treat     aggressively.  2. Renal insufficiency.  Creatinine was initially 2.1, but has since     decreased to 1.6 after volume replacement.  His previous creatinine from     his last hospitalization was 1 in July 2004.  He is currently non-     oliguric and we will continue to follow his urine output and renal panel.  3. Metabolic acidosis.  As in #1, continue with bicarbonate drip and volume     resuscitation.  4. Hypoxia secondary to pulmonary edema, possibly aspirin inducted lung     injury.  We will continue to follow closely and plan for continuous veno-     venous hemodialysis (CVVHD).  5. Elevated CPKs, MBs, troponins.  We will continue to rule out for     myocardial infarction and continued to support with pressors.                                               Terrial Rhodes, M.D.    JC/MEDQ  D:  11/21/2002  T:  11/21/2002  Job:  [id number]

## 2010-06-10 NOTE — H&P (Signed)
NAME:  SAMAJ, WESSELLS NO.:  [id number]   MEDICAL RECORD NO.:  [id number]          PATIENT TYPE:  EMS   LOCATION:  MAJO                         FACILITY:  [facility]   PHYSICIAN:  Ulyses Amor, MD DATE OF BIRTH:  Jan 12, 1951   DATE OF ADMISSION:  01/24/2006  DATE OF DISCHARGE:                              HISTORY & PHYSICAL   [name] is a 60 year old white man who is admitted to [facility] for further evaluation of chest pain.   The patient has a history of coronary artery disease which dates back to  August of 2007.  At that time, he underwent percutaneous coronary  intervention of the posterolateral branch after he presented with chest  pain.  Since that time, he has been back on multiple occasions for chest  pain.  In November, he underwent repeat cardiac catheterization.  This  demonstrated continued patency of the prior angioplasty site in the  posterolateral branch; however, he did demonstrate progression of  disease in the small intermediate branch and second obtuse marginal  vessels.  These vessels were very small and not suitable for  intervention.  Continued medical therapy and risk factor modification  was recommended.   The patient presented to the emergency department this evening after  experiencing chest pain which began upon awakening this morning. The  chest pain was described as a low-grade pressure in the center of his  chest.  It was similar in quality to his prior cardiac chest pain. This  continued throughout the course of the day, mild in intensity.  It was  not associated with dyspnea, diaphoresis or nausea.  There were no  exacerbating or ameliorating factors.  It not to be related to position,  activity, meals or respirations.  He took nitroglycerin at home but this  did not seem to have much impact on his chest pain.  He presented to the  emergency department at [facility] and was subsequently  transferred over  to [facility] in anticipation of possible  cardiac catheterization.  The patient's chest pain has subsided somewhat  since earlier today.  There is no history of myocardial infarction,  congestive heart failure or arrhythmia.   The patient has a number of risk factors for coronary artery disease.  These include dyslipidemia and smoking.  There is no history of diabetes  mellitus, hypertension or family history of early coronary artery  disease.   Other medical problems include depression, bipolar disorder and history  of seizure disorder.   MEDICATIONS:  Aspirin, Plavix, Imdur, Toprol XL, Wellbutrin, Lipitor,  Norvasc and nitroglycerin.   ALLERGIES:  CODEINE.   OPERATIONS:  1. Right shoulder replacement.  2. Right patellar reconstruction.  3. Right hip replacement.   SOCIAL HISTORY:  The patient is single.  He is self-employed as an  Airline pilot.  He has a past history of significant alcohol consumption  but does not drink at the present time.  He smokes approximately five  cigarettes a day.   REVIEW OF SYSTEMS:  Reveals no new problems related to his  head, eyes,  ears, nose, mouth, throat, lungs, gastrointestinal system, genitourinary  system or extremities.  There is no history of a neurologic or  psychiatric disorder.  There is no history of fever, chills or weight  loss.   PHYSICAL EXAMINATION:  Blood pressure 118/72, pulse 92 and regular,  respirations 22, temperature 98.2, pulse ox 99% on two liters.  The  patient was a middle-aged white man in no discomfort.  He was alert,  oriented, appropriate and responsive.  Head, eyes, nose and mouth were  normal.  The neck was without thyromegaly or adenopathy.  Carotid pulses  were palpable bilaterally and without bruits.  Cardiac examination  revealed a normal S1-S2.  There was no S3, S4, murmur, rub or click.  Cardiac rhythm was regular.  No chest wall tenderness was noted.  The  lungs were clear.  The abdomen was  soft and nontender.  There was no  mass, hepatosplenomegaly, bruit, distention, rebound, guarding or  rigidity.  Bowel sounds were normal.  Rectal and genital examinations  were not performed as they were not pertinent to the reason for acute  care hospitalization.  The extremities were without edema, deviation or  deformity.  Radial and dorsalis pedis pulses were palpable bilaterally.  Brief screening neurologic survey was unremarkable.   Electrocardiogram demonstrated equivocal evidence of a prior inferior  myocardial infarction.  There were no repolarization abnormalities  specific for ischemia or infarction.  The chest radiograph, according to  the radiologist, demonstrated no acute findings.  The initial set of  cardiac markers revealed a myoglobin of 63.8, CK-MB less than 1.0 and  troponin less than 0.05.  The remaining studies were pending at the time  of this dictation.   IMPRESSION:  1. Chest pain:  Rule out unstable angina.  2. Coronary artery disease, status post percutaneous coronary      intervention of the posterolateral branch in August of 2007.      Residual small vessel disease not amenable to intervention.  3. Dyslipidemia.  4. Depression/bipolar disorder.  5. History of seizure disorder.   PLAN:  1. Telemetry.  2. Serial cardiac enzymes.  3. Aspirin.  4. Intravenous heparin.  5. Intravenous nitroglycerin.  6. Plavix.  7. Toprol XL.  8. Further measures per Dr. [place].  9. Discontinuation of smoking discussed with patient.      Ulyses Amor, MD  Electronically Signed     MSC/MEDQ  D:  01/25/2006  T:  01/25/2006  Job:  [id number]   cc:   Peter M. [place], M.D.

## 2010-06-10 NOTE — Discharge Summary (Signed)
[name], [name]                   ACCOUNT NO.:  [id number]   MEDICAL RECORD NO.:  [id number]          PATIENT TYPE:  IPS   LOCATION:  0306                          FACILITY:  [facility]   PHYSICIAN:  Jasmine Pang, M.D. DATE OF BIRTH:  1950-06-09   DATE OF ADMISSION:  12/14/2007  DATE OF DISCHARGE:  12/17/2007                               DISCHARGE SUMMARY   IDENTIFICATION:  This is a 60 year old single white male who was  admitted on a voluntary basis on December 14, 2007.   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  He has a history of alcohol dependence and  bipolar disorder.  He reports has been quite stable since his last  admission here, December 2008.  He had been doing well on Seroquel 400  mg p.o. q.h.s. until about 1 month ago when he felt it was not working.  He was becoming more anxious and feeling he could not sleep at night.  Meanwhile, he had been absent from alcohol since his last discharged  from here in December 2008.  His psychiatrist Dr. Hortencia Pilar at the  [facility] placed him on Ambien 10 mg q.h.s.  On the day of  admission, he took one of those Ambien, subsequently became confused,  disoriented, and inadvertently took an additional 6 or 7 more tablets  over 24 hours.  His housemate brought him to the emergency room for  confusion.  He denies any suicidal intent or thoughts preceding this  episode.  He is fully oriented today, fully cooperative with the  interview, and full contact with reality.  He denies any suicidal  thoughts.   PAST PSYCHIATRIC HISTORY:  This is the one of several admissions for  this 60 year old who is followed by Dr. Hortencia Pilar at the [facility].  He was last admitted here on December 06, 2007, through December 10, 2007, for alcohol dependence.  He was detoxed at that time and  stabilized on Seroquel XR 40 mg p.o. q.h.s., Wellbutrin 200 mg p.o.  q.a.m.  He has done well since then  and reports abstinence from alcohol.  He has a history of mood swings with significant anxiety in addition to  his alcohol dependence.   MEDICAL PROBLEMS:  Hypertension, history of anxiety and depression, and  history of esophagitis.   PAST MEDICAL HISTORY:  Significant for left total hip arthroplasty by  Dr. Luiz Blare in 2009.   CURRENT MEDICATIONS:  1. Seroquel XR 400 mg p.o. q.h.s.  2. Ambien 10 mg p.o. q.h.s. p.r.n. insomnia.  3. Inderal 40 mg daily.  4,  Wellbutrin XL 300 mg daily.  1. Protonix 40 mg daily.   DRUG ALLERGIES:  No known drug allergies.   Physical exam was done in the emergency room.  There were no acute  physical or medical problems noted.   ADMISSION LABORATORIES:  Urine drug screen was negative for all  substances.  Alcohol level was less than 5.  CBC and CMET were within  normal limits.  Acetaminophen level was less than  10.  Salicylate level  was negative.   HOSPITAL COURSE:  Upon admission, the patient was started on Librium 25  mg p.o. q.6 hours p.r.n. anxiety or withdrawal and Protonix 40 mg daily.  He was also started on Inderal 40 mg daily and Seroquel 100 mg p.o. q.6  hours p.r.n. anxiety.  On December 15, 2007, Seroquel XR was increased  to 600 mg p.o. q.h.s.  On December 17, 2007, Wellbutrin XL 300 mg p.o.  q.a.m. was restarted.  In individual sessions with me, the patient was  friendly and cooperative.  He slept better with the increased Seroquel.  He began to focus on wanting to go home soon.  Mood was less depressed,  less anxious.  He stated he was only hallucinating at the time because  of the Ambien.  He has no hallucinations now.  On December 17, 2007,  sleep was good, appetite was good.  Mood was less depressed, less  anxious.  Affect was consistent with mood.  There was no suicidal or  homicidal ideation.  No thoughts of self-injurious behavior.  No  auditory or visual hallucinations.  No paranoia or delusions.  Thoughts  were logical and  goal-directed, thought content.  No predominant theme.  Cognitive was grossly intact.  Insight good, judgment good, impulse  control good.  The patient wanted to go home today.  His brother, Penn  Stockinger to pick him up.  He was felt to be safe for discharge.   DISCHARGE DIAGNOSES:  Axis I:  Bipolar disorder, not otherwise  specified.  Delirium secondary to Ambien and alcohol dependence, in  partial remission.  axis II:  None.  Axis III:  Mitral valve prolapse, hypertension, history of left total  hip replacement.  Axis IV:  Moderate (problems with primary support group, burden of  psychiatric illness).  Axis V:  GAF was 55 upon discharge.  GAF was 49 upon admission.  GAF  highest past year was 65 to 70.   DISCHARGE PLANS:  There was no specific activity level or dietary  restrictions.   POSTHOSPITAL CARE PLANS:  The patient will go to the [facility] on  December 31, 2007, at 1 o'clock p.m.  He will also go to [facility]  for counseling.   DISCHARGE MEDICATIONS:  1. Wellbutrin XL 300 mg q.a.m.  2. Seroquel XR 600 mg p.o. q.h.s.  3. Inderal 40 mg daily.  4. Protonix 40 mg daily.      Jasmine Pang, M.D.  Electronically Signed     BHS/MEDQ  D:  01/14/2008  T:  01/15/2008  Job:  [id number]

## 2010-06-10 NOTE — Cardiovascular Report (Signed)
[name], [name]                   ACCOUNT NO.:  [id number]   MEDICAL RECORD NO.:  [id number]          PATIENT TYPE:  INP   LOCATION:  6525                         FACILITY:  [facility]   PHYSICIAN:  Peter M. Swaziland, M.D.  DATE OF BIRTH:  [birth year]   DATE OF PROCEDURE:  08/31/2005  DATE OF DISCHARGE:                              CARDIAC CATHETERIZATION   INDICATION FOR PROCEDURE:  A 61 year old white male with history of  hypercholesterolemia and tobacco abuse who presents with symptoms of  increased chest pain with atypical features.  He subsequently underwent an  adenosine Cardiolite study which suggested anterior wall ischemia.   PROCEDURES:  1. Left heart catheterization.  2. Coronary and left ventricular angiography.  3. Percutaneous coronary angioplasty of the posterolateral branch of the      right coronary.   ACCESS:  Via the right femoral artery using standard Seldinger technique.   EQUIPMENT USED:  A 6-French 4 cm right and left Judkins catheter, 6-French  pigtail catheter, 6-French arterial sheath, 6-French FR-4 guide with side  holes, 0.014 high-torque floppy wire, 2.5 x 12 mm Maverick balloon.   MEDICATIONS:  Integrilin double bolus 180 mcg/kg followed by continuous  infusion of 2 mcg/kg per minute, heparin 5000 units IV, nitroglycerin 200  mcg intracoronary x1, Versed 2 mg IV.   CONTRAST:  185 mL of Omnipaque.   HEMODYNAMIC DATA:  Aortic pressure is 106/68 with a mean of 85 mmHg.  Left  ventricular pressure is 110 with an EDP of 12 mmHg.   ANGIOGRAPHIC DATA:  The left coronary artery arises and distributes  normally.  The left main coronary is normal.   The left anterior descending artery has a 40-50% stenosis in the proximal  vessel after the takeoff of the first septal perforator.  The mid LAD is  diffusely diseased up to 20-30%.  The distal LAD is diffusely small in  caliber.   The left circumflex coronary artery is relatively small in caliber.  It  gives  rise to two marginal vessels.  The first marginal vessel has 40%  narrowing proximally.  The second marginal vessel has a 70% narrowing  proximally and is very small in caliber.   The right coronary artery is a large dominant vessel.  Has no significant  atherosclerosis throughout the primary vessel.  The posterolateral branch of  the right coronary has a high-grade focal 95% stenosis.   Left ventricular angiography was performed in the RAO view.  This  demonstrates normal left ventricular size and contractility with normal  systolic function.  Ejection fraction is estimated at 61%.  There is no  mitral regurgitation or prolapse noted.   We proceeded with intervention of the posterolateral branch sine it did  appear to be his culprit lesion.  Using above equipment we were easily able  to cross the lesion with the wire and balloon.  Two inflations were  performed at 6 and 10 atmospheres.  This yielded excellent expansion of the  lesion with less than 10% residual stenosis and TIMI grade 3 flow.  Patient  had no pain  with this procedure.  There were no complications.   FINAL INTERPRETATION:  1. Three-vessel atherosclerotic coronary artery disease with culprit      lesion in the posterolateral branch of the right coronary artery.  2. Normal left ventricular function.  3. Successful percutaneous coronary angioplasty of the posterolateral      branch of the right coronary artery.           ______________________________  Peter M. Swaziland, M.D.     PMJ/MEDQ  D:  08/31/2005  T:  08/31/2005  Job:  [id number]   cc:   Hillery Aldo, M.D.

## 2010-06-10 NOTE — Discharge Summary (Signed)
[name], [name] NO.:  [id number]   MEDICAL RECORD NO.:  [id number]          PATIENT TYPE:  IPS   LOCATION:  0304                          [facility]ACILITY[facility]  [facility]   PHYSICIAN:  [name], M.D. DATE OF BIRTH:  11/10/1950   DATE OF ADMISSION:  11/05/2003  DATE OF DISCHARGE:  11/09/2003                                 DISCHARGE SUMMARY   IDENTIFYING DATA:  This is a 60 year old divorced Caucasian male voluntarily  admitted with a history of alcohol abuse since age 63.  Had been drinking  heavily and steadily for several weeks with escalating use over the last  three weeks, up to 24 beers or more day and could not control.  Reported  need for detox but felt he could not safely do this.  Denied any  benzodiazepines at the time of admission, although reported had taken a  tablet before coming here and was not sure what this was.  History of DTs  and seizures after Xanax was withdrawn in the past.  Second hospitalization  to [facility].  Last detoxed in March 21-25.  Alcohol since age 22.  Has attended  [facility] in [facility].  No history of mental illness or suicide in the family.   MEDICATIONS:  Lopressor 50 mg daily.   ALLERGIES:  CODEINE.   PHYSICAL EXAMINATION:  Physical and neurological exam within normal limits.   LABORATORY DATA:  Routine admission labs within normal limits.   MENTAL STATUS EXAM:  Fully alert.  Anxious affect.  Minimal eye contact.  Fine motor tremor.  Speech within normal limits.  Mood depressed.  Thought  processes goal directed.  No dangerous ideation.  The patient had mild  withdrawal symptoms.  Cognitively intact.  Judgment and insight are fair.   ADMISSION DIAGNOSES:   AXIS I:  1.  Alcohol dependence.  2.  Rule out a substance-induced mood disorder.   AXIS II:  Deferred.   AXIS III:  1.  History of seizure disorder with benzodiazepine withdrawal in the past.  2.  Normocytic anemia.   AXIS IV:  Moderate (problems with primary support  group and sequelae of  substance use).   AXIS V:  30/55-60.   HOSPITAL COURSE:  The patient was admitted and ordered routine p.r.n.  medications and underwent further monitoring.  Was encouraged to participate  in individual, group and milieu therapy.  Was placed on safety checks every  15 minutes and Librium detox protocol without loading dose as well as  Zyprexa for agitation.  The patient tolerated detox and clinical  intervention as well as assistance with psychosocial stressors and was  discharged in markedly improved condition.  Mood was more euthymic.  Affect  brighter.  Sleeping and eating well with no acute withdrawal symptoms and  motivated to remain abstinent and seek all substance abuse treatment  resources available and be compliant with the aftercare plan.  The patient  was discharged after medication education.   CONDITION ON DISCHARGE:  Improved.   DISCHARGE MEDICATIONS:  1.  Lopressor 50 mg daily as prescribed by primary  care physician.  2.  Trazodone 100 mg, 2 q.h.s.   FOLLOW UP:  The patient was to follow at [facility] Tuesday, November 10, 2003 and  this is at [facility] at 10 a.m.   DISCHARGE DIAGNOSES:   AXIS I:  1.  Alcohol dependence.  2.  Rule out a substance-induced mood disorder.   AXIS II:  Deferred.   AXIS III:  1.  History of seizure disorder with benzodiazepine withdrawal in the past.  2.  Normocytic anemia.   AXIS IV:  Moderate (problems with primary support group and sequelae of  substance use).   AXIS V:  Global Assessment of Functioning on discharge 55.     [name]   JEM/MEDQ  D:  12/06/2003  T:  12/07/2003  Job:  [id number]

## 2010-06-10 NOTE — Discharge Summary (Signed)
NAME:  [name], [name]                   ACCOUNT NO.:  [id number]   MEDICAL RECORD NO.:  [id number]          PATIENT TYPE:  IPS   LOCATION:  0504                          FACILITY:  [facility]   PHYSICIAN:  Geoffery Lyons, M.D.      DATE OF BIRTH:  26-May-1950   DATE OF ADMISSION:  09/29/2006  DATE OF DISCHARGE:  10/04/2006                               DISCHARGE SUMMARY   CHIEF COMPLAINT/PRESENT ILLNESS:  This was one of several admissions to  [facility] for this 60 year old single white male who  presented to the emergency room and endorsed he was having pain,  weakness all over for several days.  He apparently found his new  significant other in bed with another guy and this created a trigger for  him to relapse on his alcohol use.  The alcohol level upon presentation  was 310.  UDS was positive for benzos and barbiturates.  He recently got  approved for disability in June.   PAST PSYCHIATRIC HISTORY:  Most recently, 05/31 to 06/04 at [facility].   ALCOHOL AND DRUG HISTORY:  Known history of alcohol dependence with  frequent relapses.   MEDICAL HISTORY:  Coronary artery disease, status post angioplasty,  atypical chest pain, hyperlipidemia, degenerative joint disease, joint  replacement, right hip and right shoulder.   MEDICATIONS:  Toprol-XL, unsure of the dose, Seroquel 50 in the morning  400 at bedtime, Wellbutrin 200 in the morning.   PHYSICAL EXAMINATION:  The physical examination performed failed to show  any acute findings.   LABORATORY WORKUP:  TSH 2.059.   MENTAL STATUS EXAMINATION:  The mental status examination reveals an  alert and cooperative male, some psychomotor retardation, endorsed he  was in pain.  Speech was normal in rate, rhythm, and tone, not  spontaneous.  Mood depressed.  Affect constricted.  Thought processes  were clear, rational, and goal oriented.  The patient denies any active  suicidal or homicidal ideas, denies any active suicidal or  homicidal  ideations, endorsed he was dealing with the pain and the loss of the  relationship, wanting to be detoxed and work on long-term abstinence.  No delusions.  No hallucinations.  Cognition well-preserved.   ADMISSION DIAGNOSES:  AXIS I:  Alcohol dependence, major depressive  disorder.  AXIS II:  No diagnosis.  AXIS III:  History of coronary artery disease with angioplasty,  hyperlipidemia, atypical chest pain, joint replacement, right hip, right  shoulder.  AXIS IV:  Moderate.  AXIS V:  Upon admission 35, highest GAF in the last year 65-70.   COURSE IN THE HOSPITAL:  He was admitted.  He was detoxified with  Librium.  He was placed on the Celebrex, given Ultram for pain and got  him back on the Wellbutrin.  He endorsed that after he was here last  time, after he was admitted last time, he was able to abstain for  several months, relapsed after his pain got worse, and also dealing with  the loss of the relationship when he found his partner with another man.  We pursued  the detox.  He endorsed he was having a very hard time, a lot  of GI symptoms, diarrhea, and nausea.  We addressed his symptoms.  On  10/01/06 he was starting to get up and go to group, symptoms, GI  symptoms were better, dealing with pain but did say that the Ultram  helped before so we were willing to pursue this treatment modality.  We  pursued the detox and on 10/04/06 he was in full contact with reality.  There were no active suicidal or homicidal ideas, no hallucinations or  delusions, no evidence of active withdrawal.  Mood improved, affect  brighter, so we went ahead and discharged to outpatient follow-up.   DISCHARGE DIAGNOSIS:  AXIS I:  Alcohol dependence, major depressive  disorder.  AXIS II:  No diagnosis.  AXIS III:  Coronary artery disease, hypertension, hyperlipidemia,  atypical chest pain, joint replacement, right hip and right shoulder,  status post angioplasty.  AXIS IV: Moderate.  AXIS V:   Upon discharge 50-55.   DISCHARGE MEDICATIONS:  The patient was discharged on Toprol-XL 50 mg  per day, Seroquel 400 daily,  Ultram 50 mg every 6 hours as needed,  Wellbutrin 200 mg per day.   DISCHARGE FOLLOW UP:  Follow up with Dr. Lang Snow at [facility].      Geoffery Lyons, M.D.  Electronically Signed     IL/MEDQ  D:  10/29/2006  T:  10/30/2006  Job:  [id number]   cc:   Geoffery Lyons, M.D.

## 2010-06-10 NOTE — Op Note (Signed)
NAME:  [name], [name]                   ACCOUNT NO.:  [id number]   MEDICAL RECORD NO.:  [id number]          PATIENT TYPE:  INP   LOCATION:  0468                         FACILITY:  [facility]   PHYSICIAN:  Almedia Balls. Ranell Patrick, M.D. DATE OF BIRTH:  Apr 30, 1950   DATE OF PROCEDURE:  03/29/2004  DATE OF DISCHARGE:                                 OPERATIVE REPORT   PREOPERATIVE DIAGNOSIS:  Right patellar fracture, comminuted, displaced.   POSTOPERATIVE DIAGNOSIS:  Right patellar fracture, comminuted, displaced.   OPERATION/PROCEDURE:  Open reduction and internal fixation, right patella  fracture, using tension band technique.   SURGEON:  Almedia Balls. Ranell Patrick, M.D.   ASSISTANT:  __________.   ANESTHESIA:  General.   ESTIMATED BLOOD LOSS:  Minimal.   TOURNIQUET TIME:  One hour 49 minutes.   INSTRUMENT COUNT:  Correct.   COMPLICATIONS:  None.   INDICATIONS:  The patient is a 60 year old make who presents with a history  of a fall while drinking alcohol.  The patient has known history of alcohol  abuse.  The patient fell onto his right knee, complaining of immediate pain,  was unable to ambulate. Was brought by ambulance the [facility] Emergency  Room where he was diagnosed with comminuted displaced patella fracture.  On  exam the patient had intact skin envelope but a fair amount of swelling and  was placed in an immobilizer and left to have the skin calm down before we  proceeded ORIF the patella to restore the extensor mechanism.  At this point  the patient now has had sufficient time for skin to heal to where we can go  safely in for a surgical repair of his patella.   DESCRIPTION OF PROCEDURE:  After an adequate level of anesthesia was  achieved, the patient was positioned supine on the operating table.  This  was a radiolucent table.  A non-sterile tourniquet was placed around the  thigh.  The right leg was sterilely prepped and draped in the usual manner.  A longitudinal skin incision was  created in the midline over the knee.  This  was after exsanguination of the limb with an Esmarch and elevation of the  tourniquet to 300 mmHg.  Dissection was carried sharply down through the  subcutaneous tissues.  The patella fracture was immediately encountered.  We  evacuated the hematoma, thoroughly irrigated the wounds and cleaned off the  fracture down to patella.  We identified the torn retinaculum medially and  laterally.  We then performed, under direct visualization, a repair of the  patella using a K-wire tension band technique.  We used a large bone clamp  to compress the bone with the surgeon's finger placed in the joint from the  cartilage surface to insure anatomic or near-anatomic alignment of the  cartilage.  There were two main pieces, proximal and distal piece, and then  there was a medial, pie-shaped piece that was fairly comminuted and measured  about the size of the penny, maybe 1.5 cm in all dimensions.  It almost  seemed to key in, but again because of  comminution, was not able to be  securely fixed with a K-wires.  We took a couple of K-wires to lag this  piece into the superior piece.  As we were doing this, the piece came loose  and this was removed from the case. We did have the upper shelf piece that  could be sutured back in to the pie-shaped defect dorsally.  Thus, we placed  the two ends together, put three large 2 mm K-wires north/south with good  mobilization of the fracture fragments.  C-arm was brought in.  Multiplane C-  arm imaging did show adequate reduction. We then did a tension band  technique with 18-gauge stainless steel wire with good compression of the  fracture site.  We then fully irrigated the knee, used #2 FiberWire sutures  to prepare the medial and lateral retinaculum and the close the subcutaneous  tissue with layered closure with deep 0 Vicryl, superficial 2-0 Vicryl, and  staples for the skin.  Sterile dressing and long-leg knee  immobilizer was  applied.  The patient was taken to the recovery room.      SRN/MEDQ  D:  04/01/2004  T:  04/04/2004  Job:  [id number]

## 2010-06-10 NOTE — Discharge Summary (Signed)
NAMEJAMMAL, [name]                   ACCOUNT NO.:  [id number]   MEDICAL RECORD NO.:  [id number]          PATIENT TYPE:  INP   LOCATION:  6525                         FACILITY:  [facility]   PHYSICIAN:  [name], M.D.DATE OF BIRTH:  12-15-1950   DATE OF ADMISSION:  08/29/2005  DATE OF DISCHARGE:  09/01/2005                                 DISCHARGE SUMMARY   PRIMARY CARE PHYSICIAN:  Unassigned.   CARDIOLOGIST:  [name], M.D.   SURGEON:  [name], M.D.   DISCHARGE DIAGNOSES:  1. Three-vessel atherosclerotic coronary artery disease.      a.     Status post angioplasty to the right coronary artery.  It had a       90 to 95% stenosis.      b.     Forty percent obtuse marginal and 70% obtuse marginal #2       disease.      c.     Fifty percent proximal left anterior descending disease with 20       to 30% diffuse mid-left anterior descending disease.      d.     Normal ejection fraction.      e.     Tobacco cessation, counseled.      f.     Aspirin and beta blocker therapy initiated with statin therapy.  2. Left axillary abscess.      a.     Status post inpatient incision and drainage.      b.     Cultures pending.      c.     Septra therapy initiated.      d.     Follow up with Dr. Jamey [name].  3. Tobacco abuse--counseled on need to discontinue.  4. Remote history of seizure disorder.  5. Mitral valve prolapse.  6. History of alcohol abuse with abstinence for over a year.  7. History of depression, bipolar disorder, treated by Dr. Hortencia Pilar with      psychiatry.  8. 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  9. Patella reconstruction.  10.History of hip replacement in 2003.   DISCHARGE MEDICATIONS:  1. Toprol-XL 25 mg q. day.  2. Aspirin 325 mg q. day.  3. Zocor 20 mg q. day.  4. Nitroglycerin 0.4 mg sublingual as needed for chest pain with 1 tablet      every 5 minutes for a total of 3.  The patient is instructed to call      EMS or his MD if  he uses this medication.  5. Septra DS 1 b.i.d. for 10 days and stop.  6. Lithium as previously dosed.  7. Seroquel as previously dosed.   FOLLOWUP:  The patient will follow up with Dr. Peter [name] in 2 weeks for  recheck of his cardiac status.  He will follow up with Dr. Cicero [name] at  general surgery office Monday at 9:00 in the morning for recheck of culture  results and inspection of the patient's wound.   PROCEDURES:  1. Cardiolite suggesting ischemia.  a.     Cardiac catheterization with details as above.   CONSULTATIONS:  1. Dr. Currie [name] with general surgery.  2. [facility]/Dr. Peter [name] for chest pain.   HISTORY OF PRESENT ILLNESS:  Please see details per dictated history and  physical by Dr. Garnette [name] on August 28, 2005.   HOSPITAL COURSE:  [name]. [name] is a 60 year old gentleman who was  admitted to the hospital with complaints of intermittent chest pain on  August 28, 2005.  He underwent rule out for acute MI with serial cardiac  enzymes and EKG.  Given the nature of his pain, the decision was made to  pursue risk stratification with Cardiolite.  Cardiolite was obtained and  did, in fact, reveal an area of reversible ischemia concerning for coronary  artery disease.  The patient was then taken to the cath lab.  He was found  to have significant diffuse coronary artery disease with a specific severe  lesion in the right coronary artery that was felt to be the offending  vessel.  This was treated with angioplasty without stent placement.  Postoperatively, the patient had no further complications.  Full medical  therapy was indicated and the patient was cleared for discharge from the  cardiac standpoint.   During the hospital stay, it was appreciated that the patient was suffering  with a significant abscess in the left axillary region.  This was felt to be  too large to be treated medically only.  General surgery was consulted.  Bedside  I&D was able to be carried out on September 02, 2005.  Results were  favorable and the patient's pain improved significantly.  The patient is  being placed on Septra DS 1 p.o. b.i.d. for the possibility of community-  acquired methicillin-resistant Staphylococcus aureus.  Cultures have been  obtained but are not yet available.  The patient will follow up with Dr.  Cicero [name] for ongoing wound care.   On September 01, 2005, the patient was deemed to be medically stable for  discharge.  Follow up is as discussed above.  Vital signs are stable and the  patient was afebrile at the time of discharge.      [name], M.D.  Electronically Signed     JTM/MEDQ  D:  09/01/2005  T:  09/01/2005  Job:  [id number]   cc:   [name], M.D.  [name], M.D.

## 2010-06-10 NOTE — Consult Note (Signed)
NAME:  [name], [name]                   ACCOUNT NO.:  [id number]   MEDICAL RECORD NO.:  [id number]          PATIENT TYPE:  EMS   LOCATION:  MAJO                         FACILITY:  [facility]   PHYSICIAN:  Colleen Can. 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  He continues  to smoke a few cigarettes each day and he is trying to quit.  He has an  occasional beer.   HISTORY OF PRESENT ILLNESS:  He has had multiple recent admissions for  chest pain.  He had an episode of coronary artery disease with a  previous PCI of the posterolateral branch of his right coronary artery  and follow-up catheterization in November.  In November, he had a  moderate disease in his left anterior descending.  Intermediate was  small with an 80-90% stenosis proximally.  The left circumflex gave rise  to 2 marginal vessels.  The second OM had an 80-90% stenosis.  At the  site of previous angioplasty, the posterolateral branch had a 10-20%  stenosis.  He had normal left ventricular function.  He was admitted  again in January, was admitted at the end of January and admitted on  several other occasions.  He began having symptoms that were different  for him.  He has been nauseated over the past 2-3 weeks but has been  fatigued and achy over the past week.  He had persistence of these  symptoms.  He has had no dysuria.  He has had no diarrhea.  He has been  in epigastric discomfort.  He actually vomited twice this week.  He felt  achy in general and noted that these were similar to a flu-like illness.   PAST MEDICAL HISTORY:  He has a bipolar disorder.  He has coronary  disease, hyperlipidemia and anxiety.   ALLERGIES:  CODEINE.   CURRENT MEDICATIONS:  Vytorin 10/40, aspirin, Imdur, Ativan, Restoril,  Toprol, Norvasc, and Wellbutrin.   FAMILY  HISTORY:  Unremarkable.   SOCIAL HISTORY:  See above.   PAST MEDICAL HISTORY:  Includes right shoulder replacement, right  patella reconstruction, right hip replacement.   OTHER MEDICAL PROBLEMS:  Includes history of seizure disorder as well as  bipolar disorder.   REVIEW OF SYSTEMS:  As noted.   PHYSICAL EXAM:  Blood pressure 117/77, heart rate 109.  He is afebrile.  He is somewhat anxious.  Skin is warm and dry.  Color is normal.  LUNGS:  Clear.  HEART:  Shows a regular rate and rhythm.  ABDOMEN:  Soft.  No organomegaly.  It is nontender.  Normal bowel  sounds.  EXTREMITIES:  Without edema.   PERTINENT LABS:  Show negative hepatic markers.  Negative cardiac  enzymes x3.  White count is normal.  Platelet count is 75,000.  His EKG  shows sinus rhythm that is basically normal.   OVERALL IMPRESSION:  1. Atypical epigastric/chest discomfort, with generalized aching  discomfort and nausea and vomiting consistent with flu-like      illness.  2. History of atherosclerotic cardiovascular disease with multiple      admissions with negative evaluations.  3. Has history of anxiety and bipolar disorder.   PLAN:  I would suggest we go ahead and check urinalysis and chemistries.  Conservative management per emergency room.  I do not think that [name]  needs to be admitted at this point in time.      Colleen Can. Deborah Chalk, M.D.  Electronically Signed     SNT/MEDQ  D:  04/07/2006  T:  04/08/2006  Job:  [id number]   cc:   [name], M.D.

## 2010-06-10 NOTE — Discharge Summary (Signed)
NAME:  [name], [name]                   ACCOUNT NO.:  [id number]   MEDICAL RECORD NO.:  [id number]          PATIENT TYPE:  INP   LOCATION:  3728                         FACILITY:  [facility]   PHYSICIAN:  [name], M.D.  DATE OF BIRTH:  20-Sep-1950   DATE OF ADMISSION:  12/10/2005  DATE OF DISCHARGE:  12/13/2005                                 DISCHARGE SUMMARY   HISTORY OF PRESENT ILLNESS:  [name] is 60 year old white male who has a  history of coronary disease who has had prior angioplasty posterolateral  branch of the right coronary in August 2007.  He has a history of depression  and bipolar disorder.  He has had multiple admissions over the past 2  months.  He was admitted in September with major depression on the  psychiatric service.  He was admitted in October with altered mental status  and felt to have pneumonia.  He has been admitted on two other occasions  this month with chest pain.  The first time he ruled out myocardial  infarction and was discharged home.  The second time he underwent cardiac  catheterization.  This did demonstrate obstructive disease in small vessels  including small intermediate branch, small second obtuse marginal vessel in  the terminal posterolateral branch of the right coronary.  These were all  felt to be too small for intervention and he was treated medically.  Patient  has been under a lot situational stress.  He returned on the morning of this  admission with acute onset of substernal chest pain. He went to the [facility] Emergency Room and was admitted for further evaluation.  For  details of his past medical history, social history and family history,  please see admission history and physical.   LABORATORY DATA:  Chest X-Ray showed no active disease.   ECG showed sinus bradycardia with first-degree AV block, otherwise normal  ECG.   White count was 10,000, hemoglobin 11.7, hematocrit 34.5, platelets 249,000.  PT was 19.9 with  INR of 1.6.  Sodium 138, potassium 4.5, chloride 11, CO2  16, glucose 103, BUN 20, creatinine 1.5, calcium 8.1, other chemistries were  normal.  CK, MBs and troponins were negative x3.  Cholesterol is 151,  triglycerides 130, HDL 37, LDL of 88.   HOSPITAL COURSE:  The patient was admitted to telemetry and monitored.  He  subsequently ruled out for myocardial infarction.  He was initially treated  with IV heparin and nitroglycerin.  These were then discontinued after he  ruled out.  In addition to his prior medications,  he was started on Plavix  75 mg daily and Norvasc 2.5 mg daily.  He did tend to run a low blood  pressure and the following day he did have orthostatic symptoms as well as  complaints of headache and some nausea.  He was treated with Vistaril and  these symptoms resolved.  He had no further orthostatic changes and  tolerated his medications well.  He was progressively ambulated and felt to  be stable for discharge on December 13, 2005.  The patient was given smoking  cessation counseling.   DISCHARGE DIAGNOSES:  1. Unstable angina pectoris.  2. Coronary artery disease status post angioplasty of the posterolateral      branch of the right coronary artery.  3. Major depression with bipolar disorder.  4. History of seizures.  5. Dyslipidemia.   DISCHARGE MEDICATIONS:  1. Coated aspirin 325 mg daily.  2. Plavix 75 mg daily.  3. Imdur 30 mg per day.  4. Toprol XL 50 mg per day.  5. Wellbutrin XL 300 mg per day.  6. Lipitor 10 mg per day.  7. Norvasc 2.5 mg daily.  8. Nitroglycerin sublingual p.r.n.  9. Vistaril 25 mg q.6h. p.r.n.   FOLLOW UP:  The patient will follow up Dr. Swaziland in 2 weeks.   DISCHARGE STATUS:  Improved.           ______________________________  [name], M.D.     PMJ/MEDQ  D:  12/13/2005  T:  12/13/2005  Job:  [id number]

## 2010-06-10 NOTE — Discharge Summary (Signed)
NAMEDESHAWN, WITTY NO.:  [id number]   MEDICAL RECORD NO.:  [id number]          PATIENT TYPE:  INP   LOCATION:  6531                         FACILITY:  [facility]   PHYSICIAN:  Peter M. Swaziland, M.D.  DATE OF BIRTH:  [birth year]   DATE OF ADMISSION:  12/04/2005  DATE OF DISCHARGE:  12/05/2005                                 DISCHARGE SUMMARY   HISTORY OF PRESENT ILLNESS:  Mr. Mckamie is a 60 year old, white male who  presents with refractory chest pain.  He has been under a lot of situational  stress recently.  He had previous angioplasty at the posterolateral branch  of the right coronary in August 2007.  He was admitted for further  evaluation.  For details his past medical history, social history, family  history, and physical exam, please see admission history and physical.   LABORATORY DATA:  His ECG showed sinus bradycardia with first-degree AV  block.  No acute ST changes.  His chest x-ray showed no active disease.  His  cardiac enzymes were all negative.   HOSPITAL COURSE:  The patient was treated with subcu Lovenox and IV  nitroglycerin.  On December 05, 2005, he underwent cardiac catheterization.  This demonstrated 50% stenosis in the proximal LAD after the first septal  perforator.  This was unchanged from August.  There was a very small  intermediate branch that had an 80-90% stenosis proximally.  The second  obtuse marginal vessels were also very small and had an 80-90% stenosis  proximally.  The right coronary was without significant disease, and the  prior angioplasty site was still widely patent.  His left ventricular  function was normal, but the intermediate and second marginal vessels were  too small to consider for intervention.  Recommended medical therapy and  aggressive risk factor modification.  The patient was discharged home on the  same day of his heart catheterization in stable condition.   DISCHARGE DIAGNOSES:  1. Unstable angina.  2.  Coronary artery disease.  3. Dyslipidemia.  4. Tobacco abuse.  5. History of anxiety/depression with bipolar disorder.   DISCHARGE MEDICATIONS:  1. Aspirin 325 mg per day.  2. Toprol XL 25 mg daily.  3. Imdur 60 mg daily.  4. Vytorin 10/40 mg per day.  5. Wellbutrin XL 300 mg per day.  6. Ativan 1 mg q.6 h. p.r.n.  7. Restoril 30 mg q.h.s. p.r.n. a total of 15 tablets prescribed.   The patient instructed in no smoking cessation and low-fat diet.  He will  follow up Dr. Swaziland in 2 weeks.   DISCHARGE STATUS:  Improved.           ______________________________  Peter M. Swaziland, M.D.     PMJ/MEDQ  D:  12/05/2005  T:  12/05/2005  Job:  [id number]   cc:   Dr. Hortencia Pilar
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
## 2010-06-10 NOTE — Discharge Summary (Signed)
NAME:  [name], [name]                   ACCOUNT NO.:  [id number]   MEDICAL RECORD NO.:  [id number]          PATIENT TYPE:  IPS   LOCATION:  0508                          FACILITY:  [facility]   PHYSICIAN:  Geoffery Lyons, M.D.      DATE OF BIRTH:  1950-05-10   DATE OF ADMISSION:  03/09/2004  DATE OF DISCHARGE:  03/14/2004                                 DISCHARGE SUMMARY   CHIEF COMPLAINT AND PRESENTING ILLNESS:  This was one of several admissions  to [facility]  for this [facility] for this 60 year old white male, divorced, unemployed Airline pilot.  Relapsed on cocaine 2 weeks prior to this admission.  Started drinking wine  with a friend.  Could not cite any specific trigger for the relapse.  Presented to the emergency room complaining that he had been drinking all  day, had been unable to stop drinking.  Alcohol level was 176.  Had also  been smoking cocaine.  He claimed to be smoking cocaine all day, his urine  drug screen was negative.  Sober after his discharge in late January, stayed  sober for 2 or 3 weeks.  He was admitted on February 3 to his primary care  physician, discharged February 6 and he had also been admitted to [facility] in between.   PAST PSYCHIATRIC HISTORY:  Multiple admissions to several institutions.  Referred to Dr. Wynonia Lawman but he has not followed up.  Has had 3 detox  admissions at [facility] in the last 40 days as well as the medical facility  as well as [facility].  Noncompliant with medications.   ALCOHOL AND DRUG HISTORY:  As already stated, recurrence of alcohol,  possibly intermittent abuse of cocaine.   PAST MEDICAL HISTORY:  Chronic right shoulder pain, past history of  gastritis, anemia, depression, prior suicide gesture.   MEDICATIONS:  Wellbutrin XL 150 mg per day, Celexa 10-20 mg, Ambien claims  taking 20.  Has been taking also some Ativan but claims he has not had any  in the last few weeks.   PHYSICAL EXAMINATION:  Performed, failed to show any acute findings.   LABORATORY WORKUP:  Blood chemistries:  SGOT 21, SGPT 23.  CBC:  Hemoglobin  12.6, hematocrit 39, white blood cells 10.1.  Drug screen positive for  benzodiazepines.   MENTAL STATUS EXAM:  Reveals a fully alert male, appropriate, cooperative,  evasive, not interested in talking the issues, wanting to talk about chronic  ache in his shoulder.  Not able to describe his relapses.  Speech was normal  in tone and amount.  Mood was depressed, somewhat defensive.  Affect was  broad, minimizing, denying, projecting, rationalizing.  Cognition well  preserved.   ADMISSION DIAGNOSES:   AXIS I:  1.  Alcohol depression.  2.  Cocaine abuse.  3.  Depressive disorder not otherwise specified.   AXIS II:  No diagnosis.   AXIS III:  Degenerative joint disease.   AXIS IV:  Moderate.   AXIS V:  Global assessment of function upon  admission 30, highest global  assessment of function in past year 55-60.   COURSE IN HOSPITAL:  He was admitted and started on individual and group  psychotherapy.  He was given trazodone for sleep.  He was detoxified with  Librium.  He was given some Seroquel 100 at bedtime and he was started on  Antabuse 250 mg per day, Compra 333 two 3 times per day, Wellbutrin 150 mg  per day.  Detoxification went uneventfully.  He was most of the time in bed,  did not participate in group.  Endorsed that he relapsed shortly after he  came out of the unit.  He did not share all the information of the places he  had been since he was discharged last time.  He was wanting to go home, had  a roommate and wanting to go to meetings.  Claimed shame for relapsing,  willing to take the Compra and the Antabuse.  February 18 he was in full  contact with reality.  We continued to work on detox.  He decided to go to a  halfway house, willing to continue outpatient treatment, worked relapse  prevention.  On February 20 he was  in full contact with reality.  There were  no suicidal ideas, no homicidal ideas, no hallucinations, no delusions.  Was  going to a halfway house.  Willing and motivated to pursue further  treatment.   DISCHARGE DIAGNOSES:   AXIS I:  1.  Alcohol dependence.  2.  Cocaine abuse.  3.  depressive disorder not otherwise specified.   AXIS II:  No diagnosis.   AXIS III:  Degenerative joint disease.   AXIS IV:  Moderate.   AXIS V:  Global assessment of function upon discharge 50.   DISCHARGE MEDICATIONS:  1.  Wellbutrin XL 150 mg daily.  2.  Compra 333 two 3 times a day.  3.  Antabuse 250 1 daily.  4.  Seroquel 100 one at night.   DISPOSITION:  Follow up at the [facility].    IL/MEDQ  D:  04/12/2004  T:  04/13/2004  Job:  [id number]

## 2010-06-10 NOTE — Op Note (Signed)
   NAME:  [name], [name]                             ACCOUNT NO.:  [id number]   MEDICAL RECORD NO.:  [id number]                   PATIENT TYPE:  INP   LOCATION:  1826                                 FACILITY:  [facility]   PHYSICIAN:  Sharolyn Douglas, M.D.                     DATE OF BIRTH:  09-Mar-1950   DATE OF PROCEDURE:  DATE OF DISCHARGE:  12/25/2001                                 OPERATIVE REPORT   DIAGNOSIS:  Dislocated right total hip arthroplasty.   PROCEDURE:  Closed reduction, right dislocated right total hip arthroplasty.   SURGEON:  Dr. Lawerance Cruel.   ANESTHESIA:  General.   COMPLICATIONS:  None.   INDICATIONS:  This patient is a 60 year old male who is six weeks status  post right total hip arthroplasty done by Dr. Cecilie Kicks.  He presented to the  hospital this a.m. with a dislocation.  He was counseled regarding closed  reduction in the operating room under general anesthesia, and he elected to  proceed today.   DESCRIPTION OF PROCEDURE:  The patient was brought to the operating room and  underwent appropriate general anesthesia with muscle relaxation.  He was  transferred to the operating room table.  Fluoroscopy was brought in.  Under  fluoroscopic guidance, the hip was reduced.  We did range the hip under  fluoroscopy, it was found to be unstable in flexion and internal rotation.  The patient will be placed into an abduction brace and discharged home.                                               Sharolyn Douglas, M.D.    MC/MEDQ  D:  12/26/2001  T:  12/26/2001  Job:  [id number]
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
## 2010-06-10 NOTE — H&P (Signed)
[name], [name]                   ACCOUNT NO.:  [id number]   MEDICAL RECORD NO.:  [id number]          PATIENT TYPE:  INP   LOCATION:  0101                         [facility]ACILITY:  [facility]   PHYSICIAN:  Isidor Holts, M.D.  DATE OF BIRTH:  08-Apr-1950   DATE OF ADMISSION:  02/26/2004  DATE OF DISCHARGE:                                HISTORY & PHYSICAL   PRIMARY MEDICAL DOCTOR:  Juluis Mire, M.D. at [facility], otherwise  unassigned.   CHIEF COMPLAINT:  Depression, alcohol abuse, and weakness.   HISTORY OF PRESENT ILLNESS:  This is a 60 year old male with known history  of depression and alcohol abuse.  His last admission to [facility] was in November 2005, for depression and alcohol abuse.  He states that since discharge in November 2005, he has not had any alcohol,  however, on February 24, 2004, he started drinking again,... about 1-2  bottles of wine a day and over the last day or so, he has been feeling quite  weak and lethargic and has had diffuse abdominal discomfort, but no vomiting  or diarrhea.  He states that he feels quite depressed and had presented to  the emergency department voluntarily for admission to [facility].  Initial labs, however, showed a significant hyponatremia necessitating  admission to internal medicine.   PAST MEDICAL HISTORY:  1.  Seizure disorder, 11 years ago, no further seizure since.  2.  Alcohol abuse.  3.  Mitral valve prolapse.  4.  Status post right knee replacement, two years ago.  5.  DJD, right shoulder.  6.  Allergic rhinitis.  7.  Depression.   MEDICATIONS:  1.  Wellbutrin XL 150 mg p.o. q.a.m.  2.  Celexa one p.o. every day, query dosage.  3.  Lopressor 50 mg p.o. p.r.n.  for palpitations.  4.  Vistaril p.r.n. for allergic rhinitis.  5.  Ambien 10 mg p.o. q.h.s.   ALLERGIES:  CODEINE this causes pruritus.   SOCIAL/FAMILY HISTORY:  The patient is a widower.  He works as an  Airline pilot.  He has no  offspring.  He smokes about half a packet of  cigarettes per day.  He used to drink alcohol, about 4-5 beers at night.  He  quit in November 2005, restarted on February 24, 2004.  Denies drug abuse.  His father died at age 73-years from bone cancer.  His mother died status  post MI at age 72-years.  He has two brothers who are both alive and well.   SYSTEMS REVIEW:  As per HPI and chief complaint, otherwise negative.   PHYSICAL EXAMINATION:  VITAL SIGNS:  Temperature 98.8, pulse 82 per minute,  respiratory rate 20, BP 142/80-mmHg, pulse oximetry 96% on room air.  GENERAL:  The patient appears quite comfortable, alert, not short of breath,  at rest, communicative.  HEENT:  No clinical pallor.  No jaundice.  No conjunctival injection.  Throat is quite clear.  NECK:  Supple.  JVP not seen.  No palpable lymphadenopathy.  No palpable  goiter.  No carotid bruits.  CHEST:  Clinically clear to auscultation.  No wheezes, no crackles.  HEART:  Sounds one and two heard normal regular.  No murmurs.  ABDOMEN:  Full, soft, and nontender.  There is no palpable organomegaly.  No  palpable masses.  Normal bowel sounds.  LOWER EXTREMITIES:  No pitting edema.  Palpable peripheral pulses.  MUSCULOSKELETAL:  Not formally examined.  CENTRAL NERVOUS SYSTEM:  No focal neurologic deficit on gross examination.  Certainly no asterixis.  The patient may have mild tremulousness.   INVESTIGATIONS:  CBC:  WBC 10.5, hemoglobin 13.5, hematocrit 40.2, platelets  217.  Electrolytes:  Sodium 120, potassium 4.5, CO2 17, chloride 90, BUN 8,  creatinine 1.0, glucose 60.  urinalysis is negative.  Alcohol level is 144.  Drug screen is positive for benzodiazepines and amphetamines, otherwise  negative.   ASSESSMENT/PLAN:  1.  Hyponatremia, query secondary to potomania from alcohol abuse. Another      possibility is medication side effect, Celexa is a known culprit.  We      shall, therefore, hold his Celexa and manage him  with intravenous normal      saline.  Monitor sodium levels serially.  We shall also check urine      osmolarity and as the patient is a smoker, do a chest x-ray to rule out      any sinister lesions.   1.  Alcohol abuse.  We will commence the patient on Ativan withdrawal      protocol and watch for alcohol withdrawal phenomenon.   1.  History of depression.  We shall continue Wellbutrin.  We have held      Celexa per#1 above.  The patient will likely be transferred to      psychiatry when medically stable.   1.  Abdominal discomfort, could be secondary to alcohol gastritis.  We will      commence the patient on proton pump inhibitor and check serum amylase      and lipase levels.   1.  Smoker.  We will counsel the patient while in hospital.   Further management will depend on clinical course.      CO/MEDQ  D:  02/27/2004  T:  02/27/2004  Job:  [id number]   cc:   Juluis Mire, M.D.  [address]  [place]  [place] [address]  Fax: [phone]

## 2010-06-10 NOTE — Discharge Summary (Signed)
NAME:  BLESS, BELSHE                   ACCOUNT NO.:  [id number]   MEDICAL RECORD NO.:  [id number]          PATIENT TYPE:  INP   LOCATION:  3729                         FACILITY:  [facility]   PHYSICIAN:  Peter M. Swaziland, M.D.  DATE OF BIRTH:  05-21-1950   DATE OF ADMISSION:  01/24/2006  DATE OF DISCHARGE:  01/26/2006                               DISCHARGE SUMMARY   HISTORY OF PRESENT ILLNESS:  Kennth is a 60 year old white male who has a  known history of coronary disease status post previous intervention of  the posterolateral branch if the right coronary in August of this year.  He returned in November with recurrent chest pain and cardiac  catheterization at that time showed continued patency of this site but  he did have small vessel distal disease.  He was treated with  antianginal therapy.  He had done well up until the day before admission  when he began experiencing intermittent symptoms of epigastric and lower  sternal pain and a sensation of fullness.  He had no belching. There was  no radiation of his pain but he became anxious and presented to the  emergency department.  He was admitted to rule out myocardial infarction  but it was noted that his pain was different in character than his prior  cardiac presentation. The patient does have a history of dyslipidemia  and depression with bipolar disorder.  For details of his past medical  history, social history, family history, physical exam, please see  admission history and physical.   LABORATORY/DIAGNOSTIC DATA:  Initial cardiac enzymes were normal, C-Met  was normal.  Troponin was normal.  Magnesium is 2.4. CBC was  unremarkable. ECG showed normal sinus rhythm with small inferior Q-  waves, otherwise no acute ST-T wave changes.  Chest x-ray showed no  active disease.   HOSPITAL COURSE:  The patient was admitted to telemetry bed.  He was  placed on IV nitroglycerin and heparin.  He had no further chest pain  during his hospital  stay and seemed to respond best getting Restoril and  a good night's sleep.  He remained hemodynamically stable.  His serial  cardiac enzymes were all negative.  Serial ECGs were unremarkable.  It  is felt that his pain was noncardiac and probably related more to  anxiety and stress disorder.  He was discharged home on January 26, 2006  in stable condition.   DISCHARGE DIAGNOSES:  1. Atypical chest pain, noncardiac.  2. Coronary disease status post percutaneous coronary intervention      (PCI).  3. Hyperlipidemia.  4. Anxiety disorder with bipolar disorder.   DISCHARGE MEDICATIONS:  1. Coated aspirin 325 mg daily.  2. Plavix 75 mg daily.  3. Norvasc 2.5 mg daily.  4. Toprol XL 50 mg per day.  5. Nitroglycerin 0.4 mg sublingual p.r.n.  6. Restoril 30 mg at bedtime p.r.n., number 15 tablets only.  7. Ativan 1 mg every 8 hours p.r.n., number 15 tablets.  8. Lipitor 10 mg per day.   FOLLOWUP:  The patient was instructed to follow up with  Dr. Swaziland in 2-  3 weeks.   DISCHARGE STATUS:  Improved.           ______________________________  Peter M. Swaziland, M.D.     PMJ/MEDQ  D:  01/26/2006  T:  01/26/2006  Job:  [id number]

## 2010-06-10 NOTE — Consult Note (Signed)
NAME:  [name], [name]                            ACCOUNT NO.:  [id number]   MEDICAL RECORD NO.:  [id number]                   PATIENT TYPE:  INP   LOCATION:  4708                                 FACILITY:  [facility]   PHYSICIAN:  Mark C. Vernie Ammons, M.D.               DATE OF BIRTH:  1950-09-19   DATE OF CONSULTATION:  02/06/2002  DATE OF DISCHARGE:                                   CONSULTATION   UROLOGY CONSULTATION:   HISTORY OF PRESENT ILLNESS:  I was asked to see this 60 year old white male  in hospital consultation for further evaluation of what appeared to be an  elevated PVR and lower abdominal pain of undetermined etiology.  In talking  with the patient, he notes no prior urologic history.  He does have some  mild degree of hesitancy, nocturia x1, sometimes more if he drinks more beer  at night but no intermittency and voids with a good force of stream, he  denies any dysuria.  He has not seen any gross hematuria, has no history of  kidney stones and has never had a UTI or prostatitis.  Recently, he has been  under increased stress and noted slight increased frequency as well as  increased nocturia but he has also increased his beer intake.  He is  admitted with abdominal pain apparently after voiding with straight cath for  350 cc in the emergency room.  He reports that the pain is mild and  intermittent.  He says it seems to improve after voiding.   PAST MEDICAL HISTORY:  Negative for any major medical problems.  He  apparently is an alcoholic.   PAST SURGICAL HISTORY:  He has had a right hip replacement.   MEDICATIONS:  Occasional aspirin.   FAMILY HISTORY:  Father died at 26 of some form of bone cancer and mother at  29 of an MI.   SOCIAL HISTORY:  He is divorced, has been a heavy smoker and reports heavy  alcohol use in the past.   REVIEW OF SYSTEMS:  No abdominal complaint at this time, he has no flank  pain or suprapubic discomfort.  He has no pulmonary or GI  complaint and no  respiratory distress.   PHYSICAL EXAMINATION:  VITAL SIGNS:  His blood pressure is 115/75, pulse 88,  temperature 97.7.  GENERAL:  The patient is a slightly unkempt white male noted to be shaking  in his hospital bed but he just finished eating ice cream.  HEENT:  Oropharynx is clear.  NECK:  Supple.  CHEST:  Clear to auscultation and percussion.  CARDIOVASCULAR:  Regular rate and rhythm without murmurs, rubs, clicks, or  gallops.  ABDOMEN:  No tenderness or mass.  No peritoneal signs are noted.  He had no  palpable HSM.  No CVAT present.  GU:  He has no inguinal hernias or adenopathy.  He has a normal  circumcised  phallus with a normal glans.  His meatus is slightly small but adequate with  no lesions.  Scrotum is normally developed, testicles are descended  bilaterally and normal to palpation in size and consistency without lesion  or tenderness; no epididymal abnormalities.  RECTAL:  He has a normal anus and perineum and normal rectal tone.  The  prostate is small, smooth, symmetric, he has no suspicious nodularity or  induration or seminal vesicle abnormality; no rectal masses palpable.  EXTREMITIES:  Without clubbing, cyanosis, or edema.  SKIN:  Warm and dry.  NEUROLOGIC:  He is alert and oriented with appropriate mood and affect.   LABORATORY RESULTS:  White count is normal at 6.4, hemoglobin 13.1.  BUN 2,  creatinine 0.7.  Urinalysis is clear.   CT scan done during his last admission on January 20, 2003 revealed a 1.4  cm left lower pole cyst that appeared simple, otherwise, the kidneys appear  normal and no evidence of bladder distention or prostate pathology.   IMPRESSION:  1. Abdominal pain of undermined etiology.  It could be due to a full bladder     or incomplete emptying of the bladder but his last CT scan showed no     elevated postvoiding residual.  He has minimal obstructive symptoms     however, a trial of alpha blocker is warranted although  it may be of     little benefit.  2. Left lower pole renal cyst of no significance.   PLAN:  1. Start Flomax 0.4 mg at bedtime.  2. Note a PSA has been ordered.  3. He will follow up with me as an outpatient in 1 week.                                               Mark C. Vernie Ammons, M.D.    MCO/MEDQ  D:  02/06/2002  T:  02/06/2002  Job:  [id number]

## 2010-06-10 NOTE — H&P (Signed)
NAME:  [name], [name] NO.:  [id number]   MEDICAL RECORD NO.:  [id number]                   PATIENT TYPE:  IPS   LOCATION:  0502                                 [facility]ACILITY[facility]  [facility]   PHYSICIAN:  Geoffery Lyons, M.D.                   DATE OF BIRTH:  16-Feb-1950   DATE OF ADMISSION:  04/17/2003  DATE OF DISCHARGE:  04/23/2003                         PSYCHIATRIC ADMISSION ASSESSMENT   IDENTIFYING INFORMATION:  This is a 59 year old separated white male  voluntarily admitted on April 17, 2003.   HISTORY OF PRESENT ILLNESS:  The patient presents with a history of  depression and alcohol abuse.  Has been drinking 12 beers per day.  Feeling  very tired and shaking.  Also has been taking Valium at night for back  problems.  His stressors are the patient has been married but he is  currently having a same-sex affair.  He is now separated from his wife and  relapsed on alcohol.  He is currently denying any suicidal or homicidal  ideation or psychotic symptoms.   PAST PSYCHIATRIC HISTORY:  First hospitalization to [facility].  No outpatient treatment.  No prior psychiatric admissions.  He was  attending AA and his longest history of sobriety has been nine years from  26 to 2005.  No prior suicide attempt.   SOCIAL HISTORY:  This is a 59 year old separated white male, currently  separated.  First marriage.  No children.  His wife left him.  He states he  is currently having an affair with another man.  He is on disability.   FAMILY HISTORY:  Unknown.   ALCOHOL/DRUG HISTORY:  The patient smokes.  He has been drinking 12 beers  per day.  Has a history of seizures but is Xanax withdrawal.  No drug use.   PRIMARY CARE PHYSICIAN:  Dr. Tiburcio Pea.   MEDICAL PROBLEMS:  Mitral valve prolapse, hip replacement in 2002 and  current back problems.   MEDICATIONS:  Darvocet 1-2 every four hours and takes Motrin for  breakthrough pain.   ALLERGIES:  CODEINE.   PHYSICAL EXAMINATION:  Done at [facility].  This is a thin, middle-aged  male in no acute distress.  Temperature 98.2, heart rate 103, respirations  22, blood pressure 108/83.  He is 5 feet 8 inches tall.  He is 147 pounds.   LABORATORY DATA:  Alcohol level is less than 5.  Urine drug screen is  positive for benzodiazepines.  Chloride 94.  Sodium initially was 128 but  repeat is 132.  Potassium 4.7.  There is no tremors noted.   MENTAL STATUS EXAM:  He is an alert, middle-aged male.  Cooperative.  Little  eye contact.  Speech is clear, rambling.  His affect is flat.  Thought  processes are coherent.  There is no evidence of psychosis.  Cognitive  function intact.  Memory is  good.  Judgment is fair.  Insight is fair.   DIAGNOSES:   AXIS I:  1. Depressive disorder not otherwise specified.  2. Alcohol dependence.   AXIS II:  Deferred.   AXIS III:  1. Mitral valve prolapse.  2. Status post hip replacement in 2002.   AXIS IV:  Problems with primary support group, other psychosocial problems,  medical problems.   AXIS V:  Current 30; this past year 55.   PLAN:  Voluntary admission for alcohol and depression.  Detox safely.  Will  resume his medications.  Will check labs.  Will initiate an antidepressant.  Risk and benefits were discussed.  The patient is to encourage fluids,  attend groups, work on relapse prevention while patient is hospitalized.  The patient is to follow up with his primary care physician and attend AA  meetings to remain alcohol-free.   TENTATIVE LENGTH OF STAY:  Four to five days.     Landry Corporal, N.P.                       Geoffery Lyons, M.D.    JO/MEDQ  D:  04/23/2003  T:  04/25/2003  Job:  [id number]

## 2010-06-10 NOTE — Discharge Summary (Signed)
NAME:  [name], [name] NO.:  [id number]   MEDICAL RECORD NO.:  [id number]                   PATIENT TYPE:  IPS   LOCATION:  0502                                 [facility]ACILITY[facility]  [facility]   PHYSICIAN:  Jeanice Lim, M.D.              DATE OF BIRTH:  [birth year]   DATE OF ADMISSION:  04/17/2003  DATE OF DISCHARGE:  04/23/2003                                 DISCHARGE SUMMARY   IDENTIFYING DATA:  This is a 60 year old separated Caucasian male,  voluntarily admitted with a history of depression, alcohol abuse drinking 12  beers a day, feeling tired, shaky, taking Valium, also having back problems,  having a same-sex affair, married, separated from wife, relapsed.  No prior  psychiatric admissions.  Attended AA in the past.  No history of suicide  attempts.   MEDICATIONS:  Darvocet.   ALLERGIES:  NO KNOWN DRUG ALLERGIES.   PHYSICAL EXAMINATION:  Essentially within normal limits.  NEURO:  Nonfocal.   ROUTINE ADMITTED LABORATORY STUDIES:  Essentially within normal limits.   MENTAL STATUS EXAM:  An alert middle-aged male.  Cooperative.  Little eye  contact.  Speech clear, rambling.  Mood depressed.  Affect flat.  Thought  processes goal-directed.  Thought content negative for dangerous ideation or  psychotic symptoms.  Cognitively intact.  Judgment and insight fair.   ADMISSION DIAGNOSIS:   AXIS I:  1. Depressive disorder not otherwise specified.  2. Alcohol dependence.  3. Rule out substance induced mood disorder.   AXIS II:  Deferred.   AXIS III:  1. Mitral valve prolapse.  2. Status post hip replacement.   AXIS IV:  Moderate problems with psychosocial issues.   AXIS V:  30/60.   The patient was admitted, ordered routine and p.r.n. medications, underwent  further monitoring, was encouraged to participate in individual, group, and  milieu therapies.  The patient was detoxed with monitoring for safety.  The  patient tolerated treatment and  interventions and reported improvement in  condition.  He was discharged with no dangerous ideation, with no withdrawal  symptoms, reporting motivation to be compliant with aftercare plan and work  at his relapse prevention plan.  He was discharged after medication  education including the metabolic issues with Zyprexa on:  1. Paxil 20 mg q. a.m.  2. Zyprexa 5-10 mg q.h.s.  3. Motrin 800 mg q.8h. p.r.n. pain.   The patient was to avoid alcohol, attend AA and follow up with [facility].   DISCHARGE DIAGNOSES:   AXIS I:  1. Depressive disorder not otherwise specified.  2. Alcohol dependence.  3. Rule out substance induced mood disorder.   AXIS II:  Deferred.   AXIS III:  1. Mitral valve prolapse.  2. Status post hip replacement.   AXIS IV:  Moderate problems with psychosocial issues.   AXIS V:  Global assessment of functioning on discharge is 55-60.  Jeanice Lim, M.D.    JEM/MEDQ  D:  05/20/2003  T:  05/22/2003  Job:  [id number]

## 2010-06-10 NOTE — Discharge Summary (Signed)
NAME:  LAYTEN, AIKEN NO.:  [id number]   MEDICAL RECORD NO.:  [id number]                   PATIENT TYPE:  IPS   LOCATION:  4032                                 FACILITY:  [facility]   PHYSICIAN:  Ellwood Dense, M.D.                DATE OF BIRTH:  15-Dec-1950   DATE OF ADMISSION:  12/22/2002  DATE OF DISCHARGE:  01/02/2003                                 DISCHARGE SUMMARY   DISCHARGE DIAGNOSES:  1. Deconditioning secondary to adult respiratory distress syndrome due to     salicylate toxicity overdose.  2. Chronic pain issues.  3. Left frontal infarct.  4. Right rotator cuff problems.   HISTORY OF PRESENT ILLNESS:  Mr. Morriss is a 60 year old man originally  admitted to [facility] November 21, 2002 with mental status changes  secondary to salicylate toxicity from overuse.  He was noted to have  elevated CK and MB requiring aggressive treatment with IV hydration and  dopamine drip to maintain blood pressure.  He was intubated secondary to  ARDS.  He has been followed by critical care medicine and renal for medical  issues.  He had tracheostomy on November 11 and extubated and decannulated  without difficulty on November 26.  During his stay on acute the patient had  problems with fevers with positive blood cultures requiring treatment with  IV vancomycin.  Steroids initiated for respiratory status and currently on  slow wean.  The patient has had an episode of tachycardia and NSVT and  treated with Cardizem.  MBS done November 24 showed penetration with poor  mastication and the patient subsequently given nectar thick liquids that has  been advanced to D-3 nectar thick.  PT/OT initiated and patient at minimum  assistance for transfers, minimum assistance to ambulate 10 feet with a  rolling walker.   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   ALLERGIES:  CODEINE.   SOCIAL HISTORY:  The patient lives at [facility] half-way house for  recovering alcoholics.  He was independent and working part-time prior to  admission.  He smokes a half a pack per day, has not used any alcohol since  February 2004.   HOSPITAL COURSE:  Mr. Aarion Metzgar was admitted to rehab on December 22, 2002  for inpatient therapies to consist PT, OT, and speech therapies.  DVT  prophylaxis was maintained with use of subcu Lovenox.  Fentanyl patch was  continued for pain control with p.r.n. Vicodin initially.  Speech therapy  included a follow-up MBS on November 30 that showed mild pharyngeal  dysphagia. He was advanced to D-3 diet with thin liquids.  Labs done past  admission showed hemoglobin 11.3, hematocrit 33.0, white count 7.5,  platelets  208.  Sodium 136, potassium 3.5, chloride 102, CO2 27, BUN 8,  creatinine 0.7, glucose 88.  LFTs were essentially negative except for low  protein with total protein at 5.7, albumin at 3.0.  The patient was A&O x3,  no speech problems noted.  On December 24, 2002 the patient was noted to have  some problems with aphasia, oriented to name only, unable to follow basic  commands, with some apraxia.  He was also noted to have some left upper  extremity weakness.  Neurology was consulted for input secondary to stroke-  type symptoms.  A CCT done emergently showed no bleed.  MRI/MRA done showed  punctate acute stroke in left frontal cortex with chronic small vessel  disease in brainstem and hemispheric white matter, normal MR angiography of  large- and medium-size vessels.  Cardiac echocardiogram also done showed  overall ejection fraction to be at 55-65% without ventricular wall  abnormalities, left ventricular wall thickness moderately increased, and  left atrium moderately dilated; no cardiac source of emboli was  identified.  Carotid Dopplers done showed no ICA stenosis.  Transcranial Dopplers showed  normal flow velocities in anterior-posterior cerebral circulation.  Dr.  Orlin Hilding who has followed the patient recommended Aggrenox for CVA  prophylaxis, Zocor to be used for dyslipidemia as the patient's LDL was 115.  Other labs showed HDL at 46, VLDL 25, cholesterol 186, triglyceride 127,  homocysteine levels were high-normal at 12.51.  Hemoglobin A1c was normal at  5.2.  LFTs stayed stable except for total protein showing improvement at 6.2  and albumin increasing at 3.3.  The patient's p.o. intake has been good.  He  has been continent of bowel and bladder.  His aphasia did resolve within 24  hours.  He has had some problems with headaches on and off.  He was able to  tolerate being weaned off Duragesic patch with p.r.n. oxycodone being needed  for shoulder pain and occasional headaches.  During his stay in rehab Mr.  Nuckles progressed to being independent for bed mobility, modified independent  for transfers, modified independent for ambulating 150 feet on level  surfaces.  He was modified independent for ADLs including toileting.  His  speech therapy evaluation showed the patient to be intact for basic and high-  level expression, basic and high-level comprehension, without signs of  apraxia or dysarthria.  Also of note, pulmonary reports the patient's chest  x-ray showed some scarring on bilateral upper lobes that looks like fibrosis  and he will need follow-up chest x-ray in four weeks also.  The patient  discharged to home on January 02, 2003.   DISCHARGE MEDICATIONS:  1. Multivitamin one per day.  2. Advair 250/50 one puff b.i.d.  3. Xanax 0.25 mg one-half p.o. b.i.d. p.r.n. increased anxiety.  4. Ambien 10 mg q.h.s. p.r.n.  5. Aggrenox one p.o. b.i.d.  6. Zocor 20 mg q.h.s.  7. Tylenol 325 mg q.i.d.   ACTIVITY:  Independent currently.  WOUND CARE:  Dry dressing on prior drug site.    SPECIAL INSTRUCTIONS:  Absolutely no aspirin, Excedrin, Bayer[employer] Powders,  or any other aspirin-containing products.   FOLLOW-UP:  The patient to follow up with Dr. Earline Mayotte in 2-3 weeks, to  have follow-up chest x-ray in the next few weeks.  Follow up with Dr. Lamar Benes February 13, 2003 at 10:30 a.m.  Follow up with Dr. Pearlean Brownie in 2  months.      Pamela Panchikal, P.A.  Ellwood Dense, M.D.    PP/MEDQ  D:  01/27/2003  T:  01/28/2003  Job:  [id number]   cc:   Juluis Mire, M.D.  [address]  [place]  [place] [address]  Fax: [phone]   Oley Balm. Sung Amabile, M.D. [facility]   Ronald A. Darrelyn Hillock, M.D.  [address]  [place]  [place] [address]  Fax: [phone]

## 2010-06-10 NOTE — Op Note (Signed)
   NAME:  [name], [name]                             ACCOUNT NO.:  [id number]   MEDICAL RECORD NO.:  [id number]                   PATIENT TYPE:  INP   LOCATION:  2113                                 FACILITY:  [facility]   PHYSICIAN:  Jefry H. Pollyann Kennedy, M.D.                DATE OF BIRTH:  05/13/1950   DATE OF PROCEDURE:  12/05/2002  DATE OF DISCHARGE:                                 OPERATIVE REPORT   PREOPERATIVE DIAGNOSIS:  Ventilator dependent respiratory failure.   POSTOPERATIVE DIAGNOSIS:  Ventilator dependent respiratory failure.   PROCEDURE:  Tracheostomy.   SURGEON:  Jefry H. Pollyann Kennedy, M.D.   ANESTHESIA:  General endotracheal anesthesia was used.   COMPLICATIONS:  None.   ESTIMATED BLOOD LOSS:  Minimal.   FINDINGS:  No abnormal findings.   HISTORY:  This is a 60 year old who was admitted to the hospital last week  after an aspirin over dose. He has been on a ventilator since then. The  risks, benefits, alternatives  and complications to the procedure were  explained to the patient's family member who seemed to understand and agreed  to the surgery.   DESCRIPTION OF PROCEDURE:  The patient was taken to the operating room,  placed on the operating table in a supine position. Following the induction  of general endotracheal anesthesia via the oral tracheal tube that had  already been placed, the neck was prepped and draped in a standard fashion.  A midline incision overlying the sternal notch was created with the Bovie  cautery. Cautery was used to dissect through the superficial fascia and to  divide the diastasis of the strap muscles. The upper trachea was cleaned of  overlying fascia. The thyroid gland was below the level of dissection. A  tracheotomy was created between the second and third ring and a lower ring  flap was created using scissors. The lower ring flap was secured to the  cervical skin with a Chromic suture. Under direct visualization, the  endotracheal tube was  removed and a #8 cuffed Shiley tracheostomy tube was  placed without difficulty. Velcro TRACH ties and nylon suture were used to  secure the [facility] in place. The patient was then returned to ICU in  satisfactory condition.                                               Jefry H. Pollyann Kennedy, M.D.    JHR/MEDQ  D:  12/05/2002  T:  12/06/2002  Job:  [id number]

## 2010-06-10 NOTE — Cardiovascular Report (Signed)
[name], [name] NO.:  [id number]   MEDICAL RECORD NO.:  [id number]          PATIENT TYPE:  INP   LOCATION:  6531                         FACILITY:  [facility]   PHYSICIAN:  Peter M. Swaziland, M.D.  DATE OF BIRTH:  Jul 25, 1950   DATE OF PROCEDURE:  12/05/2005  DATE OF DISCHARGE:                              CARDIAC CATHETERIZATION   INDICATIONS FOR PROCEDURE:  The patient is a 60 year old white male with a  history of coronary disease.  He is status post angioplasty of the  posterolateral branch of the right coronary in August 2007.  He presents now  with recurrent anginal symptoms.   PROCEDURES:  1. Left heart catheterization.  2. Coronary and left ventricular angiography.   EQUIPMENT USED:  6-French 4 cm right and left Judkins catheter, 6-French  pigtail catheter, 6-French arterial sheath.   MEDICATIONS:  Local anesthesia with 1% Xylocaine, Versed 2 mg IV.   CONTRAST:  100 mL of Omnipaque.   HEMODYNAMIC DATA:  Aortic pressure is 147/89 with a mean of 113.  Left  ventricle pressure was 144 with EDP of 22 mmHg.   ANGIOGRAPHIC DATA:  The left coronary arises and distributes normally.  The  left main coronary appears normal.   The left anterior descending artery has an eccentric 50% stenosis in the  proximal vessel following the takeoff of the first septal perforator.  The  remainder of the LAD has diffuse irregularities up to 30%.  There are three  relatively small diagonal branches, which were without significant disease.   There is a very small intermediate branch, which has an 80-90% stenosis  proximally.   The left circumflex coronary gives rise to two marginal vessels.  The second  marginal vessel also has an 80-90% stenosis proximally.   The right coronary is very large, dominant vessel.  It has no significant  obstructive disease.  At the site of previous angioplasty of the  posterolateral branch there is approximately 10-20% narrowing.   Left  ventricular angiography performed in the RAO view demonstrates normal  left ventricular size and contractility with normal systolic function.   FINAL INTERPRETATION:  1. Two-vessel obstructive atherosclerotic coronary artery disease.  The      patient does have progression of disease in the small intermediate      branch and second obtuse marginal vessels compared to August 2007.      These vessels are very small and less than 1.5 mm in diameter.  They do      not appear to be suitable for angioplasty.  2. Continued patency of prior angioplasty site in the posterolateral      branch.  3. Normal left ventricular function.   PLAN:  Based on his anatomy, would recommend aggressive medical therapy and  risk factor modification.           ______________________________  Peter M. Swaziland, M.D.     PMJ/MEDQ  D:  12/05/2005  T:  12/05/2005  Job:  [id number]   cc:   Dr. Hortencia Pilar

## 2010-06-10 NOTE — H&P (Signed)
[facility]  Patient:    [name], [name] Visit Number: [id number] MRN: [id number]          Service Type: EMS Location: [facility] [place] Attending Physician:  Osvaldo Human Dictated by:   Pennelope Bracken, N.P. Admit Date:  08/08/2001                           History and Physical  DATE OF BIRTH:  [birth year]  PRIMARY CARE DOCTOR:  Juluis Mire, M.D.  CARDIOLOGIST:  Jesse Sans. Wall, M.D.  CHIEF COMPLAINT:  Chest pain.  HISTORY OF PRESENT ILLNESS:  This delightful 60 year old gentleman presents to the ED with complaint of chest pain for five days.  This chest pain is experienced as a substernal pressure.  It happens on and off and bears no relationship at all to activity.  There is some radiation of pain down the left arm.  He describes it like a weight is on his chest, and there is associated shortness of breath and diaphoresis.  Episodes last from 15 minutes to 1 hour.  The patient also describes bloody diarrheal stools and bloody emesis during the last several days.  CARDIAC RISK FACTORS: 1. Male gender. 2. Age. 3. Hypertension. 4. No personal cardiac history. 5. No hyperlipidemia. 6. No diabetes mellitus. 7. No family history of early CAD.  PAST MEDICAL HISTORY: 1. The patient has mitral valve prolapse. 2. Pancreatitis. 3. Panic attacks. 4. ETOH abuse. 5. Hypertension. 6. Atelectasis left lung, remote. 7. Right hip pain. 8. The patient had an admission July 18, 2001, to this hospital for chest pain    with negative cardiac work-up.  SOCIAL HISTORY:  He lives by himself in [place] in apartments across from the hospital.  He is a IT trainer.  He is not married and has no children.  He smokes 10 cigarettes a day and admits occasional alcohol.  There is negligible exercise.  Diet is regular, and there is no drug or herbal medicine used.  FAMILY HISTORY:  Mother deceased at an early age of suicide.  Father deceased at 107.  He had  hyperlipidemia.  There are two brothers, one who has heart disease of an unknown type but involving chest pain and palpitations.  He is 57.  ALLERGIES:  No known drug allergies.  MEDICATIONS: 1. Allegra unknown dose q.d. 2. Metoprolol 25 mg q.d.  REVIEW OF SYSTEMS:  GENERAL:  Denies recent fevers, chills, sweats.  There has been some weight loss.  HEENT:  Some visual loss, otherwise negative. SKIN:  The patient has well-healing but deep, scabby wounds bilateral knees. He obtained these while water skiing recently.  CARDIOPULMONARY: The patient admits chronic escalating shortness of breath and palpitations.  He also admits a cough but denies wheeze.  There is no orthopnea, PND, or edema.  No presyncope.  GU:  There is no hematuria, frequency, or nocturia. NEUROLOGIC/PSYCHIATRIC:  No weakness or numbness.  The patient does suffer from depression and anxiety.  MUSCULOSKELETAL:  No arthralgia or deformity. GI:  There has been no bright red blood per rectum over the last several days. Abdominal pain has persisted for several months now.  All other systems are negative.  PHYSICAL EXAMINATION:  VITAL SIGNS:  Blood pressure right arm 151/105, left arm 141/99.  Pulse 104, respiratory rate 20, temperature 99.3,  SaO2 99 on room air.  GENERAL:  This is a pale, diaphoretic, middle-aged white male who looks older than  his stated age.  He is very anxious with myoclonic jerks and eyelid tremors.  HEENT:  NCAT.  EOMI.  Sclerae clear.  Dentition is moderate.  Oropharynx is without erythema or exudate.  NECK:  Supple without thyromegaly, bruit, or JVD.  LYMPHATICS:  There is a questionable thickening, left submandibular area. This is small and mobile and not well-circumscribed.  CARDIOVASCULAR:  Regular rate and rhythm, tachycardic, clear S1, S2 and an S4. There is a 1/6 systolic ejection murmur heard at the left sternal border. Pulses are 2+ and equal without bruits.  LUNGS:  Clear to  auscultation bilaterally without murmur, rub or gallop.  SKIN:  Bilateral well-healed knee wounds.  ABDOMEN:  Soft and nontender.  Normoactive bowel sounds.  There is rebound and guarding, and the patient is very difficult to examine.  There is pulsatility in the left periumbilical area.  No organomegaly.  GENITOURINARY/RECTAL:  Deferred.  EXTREMITIES:   With clubbing, cyanosis, or edema.  MUSCULOSKELETAL:  No joint deformity or effusions.  No spine or CVA tenderness.  NEUROLOGIC:  Cranial nerves 2-12 are grossly intact, nonfocal.  LABORATORY DATA:  Chest x-ray shows no active disease, but there is mild aortic elongation.  EKG:  The rate is 100, rhythm is sinus tachycardia, normal axis.  There are small inferior Qs without hypertrophy, and no ST-T ischemic changes.  No change from June 2003, EKG.  Hemogram:  WBC 4.8, hemoglobin 10.6, hematocrit 33.5, platelets elevated at 527.  Chemistry:  Sodium 133, potassium 3.6, BUN is less than 3, creatinine 0.8, glucose 74.  ETOH is 73 on a scale of 1-10.  Cardiac enzymes are as follows:  CK 153, MB 5.5, relative index 3.5, and troponin I 0.02.  Amylase and lipase are pending are as coagulations.  ASSESSMENT AND PLAN:  Chest pain with diaphoresis and shortness of breath in a 60 year old gentleman without personal or family cardiac history.  Symptoms could be all due to gastrointestinal etiology.  The patient has been vomiting blood and having maroon stools and when questioned about having esophageal varices, he believes he has had these in the past.  He requires a formal evaluation, and he will be seen by [facility].  We will admit and continue to rule him out with enzymes, and adenosine Cardiolite has been scheduled in the morning.  If his enzymes are positive, he will certainly be taken to cardiac catheterization.  In the meanwhile, he will be started on aspirin and beta blocker.  There is no need for nitroglycerin or  heparin right  now, and his pain and anxiety will be adjusted with Xanax and morphine p.r.n. Dictated by:   Pennelope Bracken, N.P. Attending Physician:  Osvaldo Human DD:  08/08/01 TD:  08/08/01 Job: [id number]

## 2010-06-10 NOTE — Discharge Summary (Signed)
NAME:  [name], [name]                             ACCOUNT NO.:  [id number]   MEDICAL RECORD NO.:  [id number]                   PATIENT TYPE:  INP   LOCATION:  0344                                 FACILITY:  [facility]   PHYSICIAN:  Mosetta Putt, M.D.                DATE OF BIRTH:  30-Sep-1950   DATE OF ADMISSION:  01/19/2002  DATE OF DISCHARGE:  01/22/2002                                 DISCHARGE SUMMARY   DISCHARGE DIAGNOSES:  1. Gastritis esophagitis.  2. Pancreatitis and hepatitis.  3. Gastrointestinal bleed.  4. Alcohol abuse.   HISTORY OF PRESENT ILLNESS:  This 60 year old white male unassigned patient  was admitted from [facility] Room.  He had seen Dr. Mayford Knife in the past.  One week prior to admission the patient developed  some chills and fever and had had some head congestion.  Those symptoms  improved after about three days.  Two days prior to admission the patient  developed periumbilical and bilateral lower quadrant crampy abdominal pain,  which had become increasingly severe to the point that he had to call EMS to  transport him to the emergency room.  He had noticed dark red blood in his  stools for two days.  He had had frequent loose stools about every two  hours.  He had also had frequent dry heaves and a couple of episodes of mild  vomiting.  He also had had anorexia.  He admitted to heavy alcohol abuse in  the past but none for the last three years.  Since then he stated he drank 1-  2 beers a week at the most, the last time being three days prior to this  admission.  He denied any drug use and had not used any aspirin or NSAIDS.  He denied any radiation of the abdominal pain into his back.   PAST MEDICAL HISTORY:  No serious illnesses.  1. He had a history of a prolapsed mitral valve with arrhythmia, which     sounded like PAT and which had responded to single doses of propranolol.  2. He had had a right hip replacement two months prior to this  admission and     had been told  he needed elective hip replacement because of     osteoarthritis.  3. He had repair of a deviated septum in 1983.   MEDICATIONS:  Propranolol 40 mg rarely for palpitations.   PHYSICAL EXAMINATION:  GENERAL:  At the time of admission, revealed the  patient to be frail-appearing, weak, and tremulous, and in some distress  secondary to abdominal pain.  VITAL SIGNS:  Blood pressure 124/84, supine and standing, pulse 110 supine,  and 144 standing, respirations 22, temperature 98.7.  HEENT:  Unremarkable, except his mucous membranes were a little dry and his  sclerae somewhat injected.  NECK:  Unremarkable.  LUNGS:  Clear with  diffusely decreased breath sounds.  HEART:  Had a regular rhythm without murmurs or gallops.  ABDOMEN:  Mildly distended.  He had moderate diffuse tenderness with no  guarding or rebound.  Bowel sounds were normal and the remainder of the  abdominal exam was negative.  RECTAL:  Examination done in the ER was negative.  __________ negative  stool.  NEUROLOGIC:  Exam was unremarkable except the patient was moderately  tremulous.  Mental status revealed the patient to be moderately anxious.    ADMISSION DIAGNOSES:  1. Abdominal pain and rectal bleeding, rule out gastroenteritis, rule out     colitis, or possible pancreatitis.  2. A questionable recent alcohol abuse.  3. Tremulousness, hyperreflexia, questionably secondary to alcohol     withdrawal versus other etiology, including rash and to pain or anxiety.  4. Hyponatremia, possibly secondary to vomiting and diarrhea.   LABORATORY DATA:  Admission CBC showed a hemoglobin of 12.4, hematocrit  37.4, platelets 168,000, and wbc's 2800 with 4 bands, 50 polys, 39 lymphs, 4  monos, and 3 eosinophils.  Subsequent CBC showed his hemoglobin dropping to  a low of 10.4 and rose to 10.6 prior to discharge.  His wbc's rose to 4000.  His platelet counts dropped somewhat and were 118,000 prior to  discharge.  Stool was negative for occult blood.  Pro time was 12.7 with an INR of 0.9.  PTT was normal at 31.  Admission CMET was normal except for  a sodium of  131, BUN of 3, SGOT of 73, and SGPT of 44, and his lipase was elevated at  80.  Prior to discharge his CMET showed a sodium normal.  His chloride was a  little high at 117.  His BUN was 5.  An albumin was 3.3.  SGOT was 41.  Lipase was 67.  Serum iron was normal at 58.  Iron binding capacity low at  152, iron saturation normal at 38%.  B12 level was high at 1169.  Folate was  normal and ferritin was normal.  Admission urine drug screen was negative  except was positive for opiates.  Stool wbc's were negative.  Stool for O&P  was negative.  Admission __________  showed sinus tachycardia, was otherwise  negative.   Admission chest x-ray showed no active disease.  Abdominal and pelvic CT  scan with contrast showed diffuse fatty infiltration of the liver, new small  left renal cyst.  There was some question of soft tissue densities in both  inguinal canals consistent with the completely descended testicles, although  that was not apparent on the patient's physical examination.   HOSPITAL COURSE:  The patient was initially admitted to telemetry, given  intravenous fluids, as well as clear liquids p.o.  He was given Ambien for  sleep, nicotine patches, Dilaudid, and Percocet for pain.  Thiamine IV  daily.  Lorazepam as needed for nervousness or agitation, multivitamins and  folic acid and Phenergan.  Following admission the patient admitted to  taking Darvocet two q.4h. for hip pain, which he had run out four days prior  to admission.  The patient was improved by the second hospital day with  almost no pain.  He was seen in GI consultation by Dr. Melvia Heaps.  By  the second hospital day the patient admitted to drinking 6-12 beers a day for the month prior to admission, but none for three years prior to that.  He expressed some  interest in returning to AA.  He had endoscopy  done on  January 21, 2002.  Upper endoscopy showed gastritis and esophagitis.  Colonoscopy was negative.  It was felt that the gastritis and esophagitis  were the probable source of his GI bleed. The patient was given omeprazole  20 mg daily.  Telemetry was discontinued once it was unremarkable.  The  patient gradually improved.  By the day of discharge he felt well with no  pain.  He had had one loose bowel movement without blood.  He was tolerating  his diet well and ambulating without difficulty.  He was afebrile and his  vital signs were normal.  Physical examination was unremarkable except for  increased bowel sounds.  It was felt that he was ready for discharge.   DISCHARGE MEDICATIONS:  1. Prilosec 20 mg daily which he could get OTC.  2. Multivitamins one daily.  3. Darvocet-N 100 one b.i.d. p.r.n. for pain.  4. Klonopin 1 mg b.i.d. as needed for nervousness.   ACTIVITY:  Up as tolerated.   DIET:  Regular, but he was instructed to avoid alcohol.    SPECIAL INSTRUCTIONS:  The patient was advised to return to Alcoholics  Anonymous at least twice a week.   FOLLOW UP:  Follow up one week with me after discharge.  Will obtain a CBC  in the office.                                               Mosetta Putt, M.D.    PB/MEDQ  D:  03/19/2002  T:  03/19/2002  Job:  [id number]

## 2010-06-10 NOTE — Discharge Summary (Signed)
[name], [name] NO.:  [id number]   MEDICAL RECORD NO.:  [id number]          PATIENT TYPE:  INP   LOCATION:  3715                         FACILITY:  [facility]   PHYSICIAN:  Peter M. Swaziland, M.D.  DATE OF BIRTH:  07/18/1950   DATE OF ADMISSION:  11/25/2005  DATE OF DISCHARGE:  11/27/2005                                 DISCHARGE SUMMARY   HISTORY OF PRESENT ILLNESS:  Mr. Pawloski is a 60 year old white male who has a  history of coronary artery disease, history of depression and major anxiety  who presented to the Emergency Department for evaluation of chest pain.  The  patient has been under a lot of situational stress.  He has been having  major arguments with his significant other and started developing symptoms  of substernal chest pain.  He was unclear whether this represented a panic  attack or whether it was more cardiac related.  The patient does have a  known history of cardiac disease and is status post angioplasty of a  posterolateral branch in August 2007.  He had no other significant  obstructive disease at that time and had normal left ventricular function.  He has been on chronic therapy with aspirin and Lipitor.  He has also been  on Wellbutrin.  To further evaluate his chest pain, the patient was  admitted.  For details of his past medical history, social history, family  history and physical exam please see admission history and physical.   LABORATORY DATA:  White count was 10,200, hemoglobin 14.1, hematocrit 42.4,  platelets 319,000, sodium 139, potassium 4.2, chloride 103, CO2 19, glucose  97, BUN 10, creatinine 1.2, calcium was 89.5, lipase was 22.  He had serial  cardiac enzymes including CPK, MB and troponin x3 were all negative.  Chest  x-ray:  Showed no active disease.  ECG was normal.   HOSPITAL COURSE:  The patient was admitted to Telemetry.  As noted, he ruled  out for myocardial infarction.  He had significant improvement in his  symptoms  after an Ativan p.o.  He subsequently had no further chest pain.  He was ambulated.  It was felt that given the extreme situational stress  that he was under that it was most likely that his symptoms were related to  anxiety.  Given his findings on prior cardiac catheterizations, it was felt  that he was relatively low risk and could be subsequently discharged to home  on medical therapy.  As such, he was maintained on aspirin, beta blocker.  We did give him a prescription of Ativan to use on a limited basis.  He was  continued on statin therapy and discharged home in stable condition on  11/27/2005.   DISCHARGE DIAGNOSES:  1. Chest pain secondary to situational stress and anxiety.  2. Coronary artery disease status post prior angioplasty to the      posterolateral branch of the right coronary.  3. History of bipolar disorder.  4. History of mitral valve prolapse.  5. Prior history of alcohol abuse.  6. Depression and anxiety.  DISCHARGE MEDICATIONS:  1. Coated aspirin 325 mg daily.  2. Toprol XL 25 mg daily.  3. Lipitor 10 mg daily.  4. Wellbutrin XL 300 mg daily.  5. Ativan 1 mg p.o. q.8h. p.r.n.  He is given only 30 tablets with no      refills.  6. Nitroglycerin 0.4 mg sublingual p.r.n.   DISCHARGE ACTIVITIES:  The patient may resume his activities.   DISCHARGE FOLLOWUP:  Will have follow-up appoint with Dr. Swaziland in 2 weeks.   DISCHARGE CONDITION:  Improved.   It is felt that the patient had no further chest pain and we will probably  not need to do any further evaluation.  However, if he has continued  episodes of chest pain, he may require further evaluation, possibly with  cardiac catheterization.           ______________________________  Peter M. Swaziland, M.D.     PMJ/MEDQ  D:  11/27/2005  T:  11/27/2005  Job:  [id number]

## 2010-06-10 NOTE — Discharge Summary (Signed)
NAME:  [name], [name]                   ACCOUNT NO.:  [id number]   MEDICAL RECORD NO.:  [id number]          PATIENT TYPE:  INP   LOCATION:  0468                         [facility]ACILITY[facility]  [facility]   PHYSICIAN:  Almedia Balls. Ranell Patrick, M.D. DATE OF BIRTH:  [birth year]   DATE OF ADMISSION:  03/29/2004  DATE OF DISCHARGE:  04/04/2004                                 DISCHARGE SUMMARY   ADMITTING DIAGNOSES:  1.  Right patella fracture, displaced.  2.  Bipolar.  3.  Depression.  4.  Hypertension.   DISCHARGE DIAGNOSES:  1.  Right patella fracture status post open reduction internal fixation.  2.  Bipolar disease.  3.  Depression.  4.  Hypertension.   BRIEF HISTORY:  Patient is a 60 year old male who presented to the [facility] Emergency Department complaining about right knee pain secondary to a  fall.  Patient does have a history of alcohol abuse and does not remember  falling and does not remember how he injured himself.  Patient was unable to  bear weight on that right leg, complained about moderate swelling and pain.  X-rays exhibited a displaced right patella fracture.  Options were discussed  and patient opted for surgical management for his patella.   PROCEDURE:  Patient had a right patella ORIF on April 01, 2004.  Attending  service was Malon Kindle, M.D.  Assistant was [employer], P.A.-C.  Fluid  replacement was 1050 mL of crystalloid.  Estimated blood loss was minimal.  Urine output was none.  No complications.  Perioperative antibiotics were  given.   HOSPITAL COURSE:  Patient was admitted from the [facility] Emergency  Department on March 30, 2004 for the above stated reasons and for the above  stated procedure.  Patient was left to detox somewhat from Ativan protocol,  the reason for opting for surgery two days later.  Patient tolerated the  hospital stay for the first two days very well.  Complained about mild pain,  but was relieved with IV medication.  Patient had the above stated  procedure  on March 10 which he tolerated very well and after adequate time in the  postanesthesia care unit he was transferred back up to [address] [place].  Postoperative days one and two he remained afebrile but he complained of  moderate to severe pain in that right knee.  Patient actually refused  physical therapy postoperative day one, was able to complete that  postoperative day two.  He was discharged on April 04, 2004 with  weightbearing status of limited weightbearing, but always in his knee  immobilizer.   DISCHARGE PLANNING:  Patient will be discharged home on April 04, 2004.   CONDITION ON DISCHARGE:  Stable.   DIET:  Regular.   ACTIVITY:  Limited weightbearing with walker and knee immobilizer.  Daily  dressing changes.   DISCHARGE MEDICATIONS:  1.  Mepergan Fortis q.4-6h. p.r.n. pain.  2.  Robaxin 500 mg q.6h.  3.  Lexapro 20 mg daily.  4.  Seroquel 200 mg q.h.s.   ALLERGIES:  CODEINE, OXYCODONE.  TBD/MEDQ  D:  04/05/2004  T:  04/05/2004  Job:  [id number]

## 2010-06-10 NOTE — Op Note (Signed)
NAME:  [name] [name]                   ACCOUNT NO.:  [id number]   MEDICAL RECORD NO.:  [id number]          PATIENT TYPE:  INP   LOCATION:  5025                         FACILITY:  [facility]   PHYSICIAN:  Harvie Junior, M.D.   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   OPERATION PERFORMED:  1.  Right shoulder hemiarthroplasty with DePuy global stem size 10 with a 15      head.  2.  Debridement of multiple osteocartilaginous loose bodies and labral tear      from within the glenoid socket.   SURGEON:  Harvie Junior, M.D.   ASSISTANT:  Marshia Ly, P.A.   ANESTHESIA:  General.   INDICATIONS FOR PROCEDURE:  [name] [name] is a 60 year old male with a long  history of having had significant end stage degenerative joint disease,  right shoulder.  For the pain and limitation of motion, the patient was  ultimately evaluated in the office and noted to need hemiarthroplasty of the  shoulder.  We talked about options which were limited and ultimately, he  elected to come for this procedure.  He is brought to the operating room for  this procedure.   DESCRIPTION OF PROCEDURE:  The patient was brought to the operating room and  after adequate anesthesia was obtained with general anesthetic, patient was  placed supine on the operating table.  The right arm was prepped and draped  in the usual sterile fashion.  Following this, an incision was made from the  coracoid down to the base of the deltoid insertion.  Subcutaneous tissue  dissected down until the level of the deltopectoral interval was clearly  identified and held open.  Attention was then turned towards the  glenohumeral joint.  The subscapular was divided about 1 cm from its  insertion on  the lesser tuberosity and this was tagged.  The capsule  underneath was then divided around the osteophytes and all the way around  inferiorly from the osteophytes.  The osteophytes were removed anteriorly  and inferiorly and posteriorly so that the arm could be dislocated.  At this  point, a pin point reamer hole was made.  It was sequentially reamed to a  level of 10.  The cutting guide was used.  Slight additional retroversion  about 10 degrees additional to the 30 and the guide to account for the  glenoid retroversion.  Once this was completed, the final broaches were then  used  to the flat cut.  The trial balls were then put in place and the 15  seemed appropriate and at this point the shoulder was put through a range of  motion.  Arm could get easily up all the way overhead.  The 18 was trialed.  There was no translation anterior or posterior and I could get the arm up  over her head.  At this point the ball was removed.  The final prosthesis  was put in place at the same version as the trial and it was re-trialed.  A  15 again was chosen.  A 15 head was opened and placed.  At this point the  subscapular was repaired with interrupted figure-of-eight sutures x 5 in  about 40 degrees of external rotation.  It was a very nice repair that the  capsule was allowed to __________ and the subscapular was a dynamic  structure before it was repaired.  At this point the arm was again put  through a range of motion.  No tension on the subscapular repair.  The  deltopectoral interval was allowed to fall together and was closed with 2-0  Vicryl and the skin with skin staples.  Sterile compressive dressing was  applied.  The patient was taken to the recovery room where she was noted to  be in satisfactory condition.  The estimated blood loss for this procedure  was 500 mL.      Harvie Junior, M.D.  Electronically Signed     JLG/MEDQ  D:  03/08/2005  T:  03/09/2005  Job:  [id number]

## 2010-06-10 NOTE — Procedures (Signed)
[place]  Patient:    [name], [name] Visit Number: [id number] MRN: [id number]          Service Type: EMS Location: [place] Attending Physician:  Pearletha Alfred Dictated by:   Barbette Hair. Arlyce Dice, M.D. [facility] Proc. Date: 08/08/01 Admit Date:  08/14/2001   CC:         Thomas C. Wall, M.D. [facility]   Procedure Report  PROCEDURE:  Upper endoscopy.  SURGEON:  Barbette Hair. Arlyce Dice, M.D.  INDICATIONS:  The patient was admitted with chest and abdominal pain, hematemesis, and small amounts of melena.  He is a heavy alcohol user.  INFORMED CONSENT:  The patient provided consent after risks, benefits, and alternatives were explained.  MEDICATIONS:  Versed 17.5, fentanyl 100 mcg IV, and Cetacaine spray.  DESCRIPTION OF PROCEDURE:  The patient was placed in the left lateral decubitus position and administered continuous low flow oxygen and placed on pulse oximetry.  The Olympus video gastroscope was inserted under direct vision into the oropharynx and esophagus.  FINDINGS: 1. At the GE junction, there was a peptic stricture with superficial erosion    extending 1-2 cm proximally. 2. In the prepyloric antrum, there is a superficial liver ulcer.  There is    surrounding erythema.  The base of the ulcer was clean without stigmata of    recent hemorrhage.  Biopsies were taken. 3. A 3-4 mm pyloric channel ulcer with a clean base.  IMPRESSION: 1. Active gastric and pyloric channel ulcers without stigmata of recent    hemorrhage. 2. Esophageal stricture. 3. Esophagitis.  RECOMMENDATIONS:  Protonix 40 mg IV q.d. Dictated by:   Barbette Hair Arlyce Dice, M.D. [facility] Attending Physician:  Pearletha Alfred DD:  08/08/01 TD:  08/14/01 Job: [id number]

## 2010-06-10 NOTE — Consult Note (Signed)
NAME:  [name], [name]                             ACCOUNT NO.:  [id number]   MEDICAL RECORD NO.:  [id number]                   PATIENT TYPE:  IPS   LOCATION:  4032                                 FACILITY:  [facility]   PHYSICIAN:  Santina Evans A. Orlin Hilding, M.D.          DATE OF BIRTH:  [birth year]   DATE OF CONSULTATION:  12/24/2002  DATE OF DISCHARGE:                                   CONSULTATION   CHIEF COMPLAINT:  Altered speech.   HISTORY OF PRESENT ILLNESS:  [name] is a 60 year old white male who was  transferred to rehabilitation from medical services three days for  rehabilitation for deconditioning.  After prolonged hospitalization for  salicylate overdose with complications including infection, cardiac and  renal complications, prolonged intubation with tracheotomy.  He has been  getting around okay.  He was fine last night.  He has been on Lovenox for  DVT prophylaxis.  This morning around 8 o'clock he was noted to be agitated  and complaining of headache.  Otherwise normal and able to converse.  Around  9 o'clock, he was then noted to be aphasic.   REVIEW OF SYSTEMS:  Primary headache.  It appears to be left-sided although  I cannot otherwise elicit it from it.   PAST MEDICAL HISTORY:  1. Significant for salicylate overdose.  Looking at the chart he had some     elevations in CK-MB.  He was in respiratory failure with a tracheotomy,     hypotension.  He may have had renal failure.  I am having trouble reading     the notes, and he was deconditioned.  2. He also has a remote history of GI bleed.  3. Mitral valve prolapse.  4. 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  5. Subsequent total hip replacement on the right.  6. Right rotator cuff problems.  7. History of alcoholism and pancreatitis, hepatitis, currently in therapy.  8. Plantar plasty.  9. History of coagulopathy.  I do not know the details.  10.      History of urinary retention.   MEDICATIONS:  1. Nystatin.  2. Lovenox.  3. Multivitamin.  4. Advair.  5. Protonix.  6. Duragesic.  7. Prednisone.  8. Xanax.  9. Ventolin.  10.      _________ sorbitol.  11.      Maalox.  12.      Ambien.  13.      Tylenol.   ALLERGIES:  Codeine and oxycodone.   SOCIAL HISTORY:  Living in a halfway house prior to admission.  No alcohol  use in the recent past.  Does smoke cigarettes.   FAMILY HISTORY:  Noncontributory.   PHYSICAL EXAMINATION:  VITAL SIGNS:  Pulse 72, blood pressure 164/94,  respirations 20 with 97% saturation.  Head is normocephalic and atraumatic.  NECK:  There is a healing tracheostomy.  HEART:  Regular rate and  rhythm.  NEUROLOGIC/MENTAL STATUS:  Awake and alert, expressive greater than  receptive aphasia.  Unable to name or repeat.  He is able to say some words  and follow simple but not complex commands.  Cranial nerves:  Pupils are  equal and reactive.  Visual fields are full.  Extraocular movements are  intact.  Facial sensation is normal.  Facial motor activity is normal.  Hearing is intact.  Palate is intact.  Tongue is midline.  He is hoarse from  the previous tracheostomy.  There is no weakness or field cut.  On motor  exam, he has no drift.  He has normal strength although he has decreased  range of motion on the right shoulder because of the rotator cuff injury.  Reflexes are 2+ and downgoing.  Coordination:  Heel to shin is intact.  He  is unable to cooperative finger-to-nose just because of confusion.  Sensory:  He has decrease in the right upper extremity.  He is mildly anemic  otherwise.   LABORATORY DATA:  CBC is unremarkable.  C-MET is largely unremarkable except  low protein and albumin.  PT and INR not done yet.  CT of the brain is still  pending.   IMPRESSION:  1. Aphasia, right arm numbness and headache suggests left brain event.  We     need to rule out a hemorrhage, thalamic, cortical or an infarct.  2. He also has recent  aspirin overdose with complications.   PLAN:  1. New CT stat.  2. We are now outside of the time limit for TPA.  We do not know yet of a     hemorrhage in any case.  He is on Lovenox and has a history of     coagulopathy and this is probably contraindicated.  Depending on the CT,     he may need workup for infarct versus hemorrhage.                                               Catherine A. Orlin Hilding, M.D.    CAW/MEDQ  D:  12/24/2002  T:  12/24/2002  Job:  [id number]

## 2010-06-10 NOTE — H&P (Signed)
NAME:  [name], [name]                   ACCOUNT NO.:  [id number]   MEDICAL RECORD NO.:  [id number]          PATIENT TYPE:  INP   LOCATION:  0345                         FACILITY:  [facility]   PHYSICIAN:  Isidor Holts, M.D.  DATE OF BIRTH:  1950/02/22   DATE OF ADMISSION:  12/21/2003  DATE OF DISCHARGE:                                HISTORY & PHYSICAL   PRIMARY CARE PHYSICIAN:  Unassigned.   CHIEF COMPLAINT:  Depression/alcohol abuse.   HISTORY OF PRESENT ILLNESS:  Essentially as above. This is a 60 year old  male with known history of alcoholism. He drinks about 4 or 5 beers each  night, now claims however to have cut down to about 3 to 4 beers at night.  He states that today, he felt quite depressed and drove himself to the  [facility], where he was referred to the emergency department  at [facility]. He states that his last drink was about 4:30 p.m.  on December 21, 2003. He states this was a tall beer, also took 2 pills of  Xanax 1 mg.   PAST MEDICAL HISTORY:  1.  Seizure disorder 11 years ago, no further seizure since.  2.  Alcohol abuse.  3.  Mitral valve prolapse.  4.  Status post right knee replacement 2 years ago.  5.  Degenerative joint disease right shoulder.   MEDICATIONS:  1.  Ibuprofen over-the-counter p.r.n.  2.  Tylenol over-the-counter p.r.n.   ALLERGIES:  CODEINE causes pruritus.   SOCIAL HISTORY:  The patient is a widower, works as an Airline pilot. Has no  children. Smokes on and off. Drinks about 4 to 5 beers per night, and  downs about 3 to 4 beers according to patient. Denies drug abuse.   FAMILY HISTORY:  Father died at 19 years of age from bone cancer. Mother  died status post MI at age 68.  He has 2 brothers both of whom are alive and well.   REVIEW OF SYSTEMS:  As per history of present illness and chief complaint,  otherwise negative.   PHYSICAL EXAMINATION:  VITAL SIGNS:  Temperature 97.7, pulse 95 per minute,  regular,  respiratory rate 17, blood pressure 138/87 mmHg. Pulse oximetry 97%  on room air.  GENERAL:  Appears quite comfortable. Alert, communicative. Strong smell of  alcohol on breath.  HEENT:  No pallor, no jaundice. Fair dentition. Throat is clear.  NECK:  Supple, JVP not seen, no palpable lymphadenopathy. No palpable  goiter. No carotid bruits.  CHEST:  Clear to auscultation. No wheezes, no crackles.  CARDIOVASCULAR:  Heart sounds S1, S2 heard, normal rhythm, no murmurs.  ABDOMEN:  Full, soft and nontender. There is no palpable organomegaly, no  palpable masses.  Normal bowel sounds.  EXTREMITIES:  Lower extremity exam reveals no pitting edema. Palpable  peripheral pulses.  CNS:  Mental status was not formally examined. The patient has tremulousness  but no asterixis.   LABORATORY DATA:  CBC:  WBC 11.8 thousand, hemoglobin 13.1, hematocrit 89.9,  platelets 398,000. Electrolytes: Sodium 133, potassium 3.9, chloride 99, CO2  25,  BUN 13, creatinine 1.0, glucose 83.  Urinalysis: Within normal limits.  Alcohol level is 118 mg/dl. Drug screen was positive for  benzodiazepines/barbiturates, otherwise negative.   ASSESSMENT AND PLAN:  1.  This is a patient with history of alcohol abuse who presents basically      because of depression.  Unfortunately, he was not accepted by [facility] and was redirected to the emergency room at [facility].      He is now having features of alcohol withdrawal. The patient is      therefore admitted for observation. I`m  commencing him on Ativan      withdrawal protocol,  Also thiamine, folate and multivitamins.   1.  Depression. This is probably best addressed on an outpatient basis. He      may benefit from an SSRI.  This is likely to be addressed prior to      discharge. Further management will depend on his clinical course.     Chri   CO/MEDQ  D:  12/22/2003  T:  12/22/2003  Job:  [id number]

## 2010-06-10 NOTE — Op Note (Signed)
NAME:  [name], [name]                   ACCOUNT NO.:  [id number]   MEDICAL RECORD NO.:  [id number]            PATIENT TYPE:   LOCATION:                                 FACILITY:   PHYSICIAN:  Currie Paris, M.D.   DATE OF BIRTH:   DATE OF PROCEDURE:  09/01/2005  DATE OF DISCHARGE:                                 OPERATIVE REPORT   PREOPERATIVE DIAGNOSIS:  Infected epidermoid cyst of left axillary area.   POSTOPERATIVE DIAGNOSIS:  Infected epidermoid cyst of left axillary area.   OPERATION:  I&D of left axillary cyst.   SURGEON:  Currie Paris, M.D.   ANESTHESIA:  Local.   MEDICAL HISTORY:  [name] is a 60 year old gentleman seen yesterday who had  developed an inflamed, abscessed, epidermoid cyst just below the left  axilla.  He had just had cardiac cath; and was on anticoagulation when he  was seen, so we postponed drainage until today.   DESCRIPTION OF PROCEDURE:  In the patient's room.  We confirmed the plans  for the procedure.  The abscess was very visible measuring about 3 cm  across.   It was prepped with some Betadine and anesthetized with 2% Xylocaine.  We  had excellent anesthesia.  We made an elliptical incision over the center  most fluctuant area, developed, and drained purulent material which was  cultured.  We also got a fair amount of sebum out.  It appeared dry and was  packed.  I will do local wound care following this.      Currie Paris, M.D.  Electronically Signed     CJS/MEDQ  D:  09/01/2005  T:  09/01/2005  Job:  [id number]

## 2010-06-10 NOTE — Discharge Summary (Signed)
[name], [name]                   ACCOUNT NO.:  [id number]   MEDICAL RECORD NO.:  [id number]          PATIENT TYPE:  IPS   LOCATION:  0301                          FACILITY:  [facility]   PHYSICIAN:  Jeanice Lim, M.D. DATE OF BIRTH:  April 18, 1950   DATE OF ADMISSION:  02/11/2004  DATE OF DISCHARGE:  02/18/2004                                 DISCHARGE SUMMARY   IDENTIFYING DATA:  This is a 60 year old male, widowed, voluntarily  admitted.  Increased depression for a month, panic attacks over the day,  unable to sleep, vivid dreams, isolating, having thoughts of wanting to cut  wrist.  Third [facility] admission.  History of depressive  episodes, alcohol abuse.   MEDICATIONS:  Lopressor.  Stopped all medications while drinking.   ALLERGIES:  CODEINE.   PHYSICAL EXAMINATION:  Physical and neurologic exam within normal limits.   LABORATORY DATA:  Routine admission labs essentially within normal limits.   MENTAL STATUS EXAM:  Fully alert, pleasant, cooperative, tearful, anxious.  Mood depressed, hopeless, helpless, labile.  Thought processes positive for  suicidal ideation.  Cognitively intact.  Judgment and insight are fair.   ADMISSION DIAGNOSES:   AXIS I:  1.  Rule out bipolar disorder.  2.  Alcohol dependence, in partial remission.   AXIS II:  Deferred.   AXIS III:  Mitral valve prolapse.   AXIS IV:  Severe (stressors related to primary support group).   AXIS V:  30/75.   HOSPITAL COURSE:  The patient was admitted and ordered routine p.r.n.  medications and underwent further monitoring.  Was encouraged to participate  in individual, group and milieu therapy.  Placed on detox protocol with  p.r.n.'s and Risperdal and safety checks and started Wellbutrin targeting  depressive symptoms.  The patient complained on panic attacks, isolating  with strong suicidal urges when feeling bad.  The patient gradually reported  a positive response to crisis intervention,  clinical intervention,  supportive treatment and aftercare plan.   CONDITION ON DISCHARGE:  The patient was discharged in improved condition.  Mood more euthymic.  Affect brighter.  No dangerous ideation or psychotic  symptoms.  Given medication education.   DISCHARGE MEDICATIONS:  1.  Risperdal 0.5 mg at 8 p.m.  2.  Neurontin 300 mg, 2 at 8 p.m.  3.  Midrin 2 caps onset and p.r.n. q.2h. up to 6 in a 24 hour period.  4.  Ambien 10 mg q.h.s.  5.  Wellbutrin XL 150 mg q.a.m.  6.  Celexa 20 mg, 1/2 q.a.m.  7.  Vistaril 50 mg q.8h. p.r.n.   FOLLOW UP:  The patient is to follow up at [facility]  with Donnie Aho and therapist, Lavone Neri.   DISCHARGE DIAGNOSES:   AXIS I:  1.  Rule out bipolar disorder.  2.  Alcohol dependence, in partial remission.   AXIS II:  Deferred.   AXIS III:  Mitral valve prolapse.   AXIS IV:  Severe (stressors related to primary support group).   AXIS V:  Global Assessment of Functioning on discharge 55-60.  JEM/MEDQ  D:  03/23/2004  T:  03/24/2004  Job:  [id number]

## 2010-06-10 NOTE — Discharge Summary (Signed)
NAME:  [name], [name]                             ACCOUNT NO.:  [id number]   MEDICAL RECORD NO.:  [id number]                   PATIENT TYPE:  INP   LOCATION:  4708                                 FACILITY:  [facility]   PHYSICIAN:  Ermalene Searing. Leander Rams, M.D.                DATE OF BIRTH:  [birth year]   DATE OF ADMISSION:  DATE OF DISCHARGE:  02/07/2002                                 DISCHARGE SUMMARY   CONSULTANT:  Mark C. Vernie Ammons, M.D.   DISCHARGE DIAGNOSES:  1. Lower abdominal pain, resolved.  2. Heme-positive stools.  3. Coagulopathy secondary to EtOH abuse.  4. Urinary retention.  5. Thrombocytopenia.  6. EtOH abuse.   DISCHARGE MEDICATIONS:  1. Prilosec 20 mg daily.  2. Flomax 0.4 mg daily.  3. Inderal 10 mg as needed.   CONSULTATIONS:  Dr. Ihor Gully of urology.   PROCEDURES AND STUDIES:  EKG on 01/15 was normal sinus rhythm.   Discharge Labs:  WBC 6.4, RBCs 4.52, hemoglobin 13.1, hematocrit 40.2, MCV  89, platelets 40,000.  Sodium 134, potassium 5.1, chloride 102, CO2 26,  glucose 90, BUN 2, creatinine 0.8, AST 55, ALT 24, alkaline phosphatase 118.  CT 21.5, INR of 2.1, alcohol level 322.  Urine drug screen was positive for  opiates.  CK 155, CKMB 1.7, troponin 0.01.   DISPOSITION:  The patient will be discharged home.   CONDITION ON DISCHARGE:  Stable.   HISTORY OF PRESENT ILLNESS:  This is a 60 year old man who presented to  [facility] ED on 02/05/02 with complaints of lower abdominal pain  and blood per rectum.  The patient also complained of heart palpitations  that started approximately 2 days prior to presentation.  The patient has a  history of peptic ulcer disease and had been using aspirin for the past two  weeks. He also has a history of alcohol abuse.  Initial evaluation noted a  trace heme positive stool.  PT INR were elevated.  Hemoglobin was stable at  13.1.  The patient was admitted for further evaluation and treatment.   HOSPITAL COURSE:  Problem  #1: LOWER ABDOMINAL PAIN.  Differential diagnosis  including gastritis, peptic ulcer disease, varices, pancreatitis, and  urinary retention.  Protonix was started on admission, amylase and lipase  were normal.  The patient  had no complaints of abdominal pain on  hospitalization day #2, but he did report having a 1-year history of  difficulty initiating urinary stream.  Postvoid residual was checked which  was elevated.  A urology consult was obtained by Dr. Vernie Ammons.  He was seen  and started on Flomax.  His abdominal pain did resolve prior to discharge.   Problem #2:  HEME-POSITIVE STOOL.  Again, the patient has a history of EtOH  abuse and peptic ulcer disease.  His hemoglobin was stable during this  hospitalization.  PPI was added for gastric  retention.   Problem #3:  COAGULOPATHY.  Most likely secondary to EtOH abuse. His INR was  elevated at 2.1.  Vitamin K was given daily x3.   Problem #4:  THROMBOCYTOPENIA.  Most likely due to bone marrow toxicity  secondary to EtOH abuse.  This was watched closely.  The patient did not  require any transfusion platelets and found to be __________.   Problem #5:  EtOH ABUSE.  The patient refused any help with his alcohol  problem.   DISCHARGE INSTRUCTIONS AND FOLLOW UP:  The patient is to follow up with Dr.  Vernie Ammons in follow up in approximately 1 week.  He was instructed to avoid  all aspirin and Motrin products.     Stephanie Swaziland, NP                      Ermalene Searing. Leander Rams, M.D.    SJ/MEDQ  D:  02/08/2002  T:  02/09/2002  Job:  [id number]   cc:   Loraine Leriche C. Vernie Ammons, M.D.  [address]  [place]  [place] [address]  Fax: [phone]

## 2010-06-10 NOTE — H&P (Signed)
NAME:  [name], [name]                             ACCOUNT NO.:  [id number]   MEDICAL RECORD NO.:  [id number]                   PATIENT TYPE:  INP   LOCATION:  5032                                 FACILITY:  [facility]   PHYSICIAN:  Georges Lynch. Darrelyn Hillock, M.D.             DATE OF BIRTH:  06/15/1950   DATE OF ADMISSION:  11/05/2001  DATE OF DISCHARGE:                                HISTORY & PHYSICAL   CHIEF COMPLAINT:  Right hip pain.   HISTORY OF PRESENT ILLNESS:  The patient is a 60 year old male who has noted  right hip pain since 1999 when he was seen by another orthopedist in town.  At that time he was diagnosed as having avascular necrosis.  Since that time  he states he has been getting along fairly well but has noted this past year  that the pain has increased to the point that he has difficulty ambulating.  X-rays were reviewed from 1999 which shows severe aseptic necrosis of both  hips.  He has complete collapse with migration of his femoral neck of  acetabulum is really above the head of the right hip.  It is felt at this  time that he would benefit from a right total hip arthroplasty.  The patient  understands the risks and benefits of the procedure and wishes to proceed.   PAST MEDICAL HISTORY:  Avascular necrosis bilateral hip, gastroesophageal  reflux disease, mild pancreatitis, history of seizure.   PAST SURGICAL HISTORY:  Rhinoplasty, pneumothorax back in high school.   MEDICATIONS:  1. Darvocet-N 100 two p.o. q.4-6h. p.r.n. pain.  2. Metoprolol 25 mg one p.o. p.r.n.   ALLERGIES:  CODEINE causes itching.   SOCIAL HISTORY:  The patient is divorced and lives at home in a one story  house which has five steps leading to the entrance.  He admits to smoking a  half to one pack of cigarettes per day, and occasional alcohol use.   FAMILY HISTORY:  Father deceased at age 5 of prostate cancer.   REVIEW OF SYSTEMS:  No fever, chills, night sweats, bleeding tendencies.  CNS:  No  blurry, double vision, seizure, headache or paralysis.  RESPIRATORY:  No shortness of breath, productive cough or hemoptysis.  CARDIOVASCULAR:  No chest pain, angina or orthopnea.  GI:  No nausea,  vomiting, diarrhea, constipation, melena or bloody stools.  GU:  No dysuria,  hematuria or discharge.  MUSCULOSKELETAL:  As pertinent to HPI.   PHYSICAL EXAMINATION:  GENERAL APPEARANCE:  This is a well-developed, well-  nourished 60 year old male in no acute distress.  VITAL SIGNS:  Temperature 98.2, pulse 79, respiratory rate 16, blood  pressure 124/79.  HEENT:  Atraumatic and normocephalic.  Pupils are equal, round, reactive to  light and accommodation.  EOMs intact.  NECK:  Supple with no lymphadenopathy.  CHEST:  Clear to auscultation bilaterally.  No wheezing, rhonchi or rales.  CARDIOVASCULAR:  Regular rate and rhythm without murmurs, rubs, or gallops.  ABDOMEN:  Soft and nontender, nondistended, bowel sounds x4.  BREASTS/GU:  Not examined, no pertinent to HPI.  SKIN:  No rashes or lesions.  EXTREMITIES:  Right lower extremity is one half to 3/4 inch shorter than the  left.  She has painful decreased range of motion on the right.  NEUROLOGIC:  Distal neurovascular examination is intact.   IMPRESSION:  1. Avascular necrosis of the right hip.  2. Gastroesophageal reflux disease.  3. History of pancreatitis.  4. History of seizures.   PLAN:  The patient will be admitted to [facility] on  November 05, 2001, to undergo a right total hip arthroplasty by Georges Lynch.  Darrelyn Hillock, M.D.      Roma Schanz, P.A.                   Ronald A. Darrelyn Hillock, M.D.    CS/MEDQ  D:  11/05/2001  T:  11/06/2001  Job:  [id number]

## 2010-06-10 NOTE — Discharge Summary (Signed)
NAME:  [name], [name]                             ACCOUNT NO.:  [id number]   MEDICAL RECORD NO.:  [id number]                   PATIENT TYPE:  INP   LOCATION:  4707                                 FACILITY:  [facility]   PHYSICIAN:  Oley Balm. Sung Amabile, M.D. [facility]          DATE OF BIRTH:  [birth year]   DATE OF ADMISSION:  11/21/2002  DATE OF DISCHARGE:  12/22/2002                                 DISCHARGE SUMMARY   DISCHARGE DIAGNOSES:  1. Ventilator dependent respiratory failure with __________ acquired     pneumonia and adult respiratory distress syndrome.  2. Acute renal insufficiency secondary to salicylate toxicity.  3. Toxic metabolic encephalopathy.  4. Critical illness polyneuropathy.  5. Anemia.   HISTORY OF PRESENT ILLNESS:  [name] is a 60 year old white male who  has a known history of ETOH abuse and was in a halfway house.  A friend  noted that he was not himself and transported him to the emergency  department where he was found to be hypotensive and was placed on  vasopressors and treated with aggressive IV hydration.  At that time, the  pulmonary critical care service was called by Dr. Ladona Ridgel as a consult and  later assumed his total care.   LABORATORY DATA:  Not available in the front of the chart that I have in  front of me and will need to be dictated at a later time.   PROCEDURES:  1. On December 05, 2002, he had a tracheostomy inserted by Jefry H. Pollyann Kennedy,     M.D.  2. He had a CVVH catheter placed by Dr. Arrie Aran.  3. He was decannulated on December 19, 2002.   HOSPITAL COURSE:  #1 - VENTILATOR DEPENDENT RESPIRATORY FAILURE SECONDARY TO  ACUTE RESPIRATORY DISTRESS:  He was initially intubated on November 21, 2002,  and required aggressive pulmonary titration with a ventilator.  At one time  he had to be prone due to increasing peak inspiratory plateau pressures and  required heavy sedation.  He was able to slowly improve from his ARDS.  He  trached on December 05, 2002.  He had insert tracheostomy collar trials on  December 12, 2002, and was successfully liberated from the ventilator on  December 16, 2002, and decannulated on December 19, 2002.   #2 - SALICYLATE POISONING:  This led to renal insufficiency.  He required  CVVH which was performed by the renal services via his renal catheter.  His  electrolytes returned to normal.  His BUN and creatinine improved and the  cannula was discontinued.   #3 - TOXIC METABOLIC ENCEPHALOPATHY:  His sensorium was noted to be dull  following severe illness.  He did improve and was treated for a while with  Seroquel and Xanax.  All medications were stopped prior to his transfer to  rehabilitation.   #4 - CHRONIC PAIN:  This initiated his taking aspirin unintentionally for  pain.  Therefore, a pain consult will be needed at a later date.   #5 - DEBILITY SECONDARY TO PROLONGED HOSPITALIZATION:  He will be  transferred to the rehabilitation services for rehabilitation.   #6 - HISTORY OF SUPRAVENTRICULAR TACHYCARDIA AND HISTORY OF MITRAL VALVE  PROLAPSE:  He will remain on metoprolol.  He will be further evaluated and  treated at a later date.   DISCHARGE MEDICATIONS:  Available at rehabilitation services.   DISPOSITION/CONDITION ON DISCHARGE:  Severely debilitated secondary to  prolonged hospitalization, but in improved condition.  He had been  successfully liberated from the ventilator.  Vital signs have returned to a  normal state.      [name], A.C.N.P. [facility]                 Oley Balm. Sung Amabile, M.D. [facility]    SM/MEDQ  D:  01/14/2003  T:  01/14/2003  Job:  [id number]   cc:   Melissa L. Ladona Ridgel, MD   Jefry H. Pollyann Kennedy, M.D.  [address] [place]  [place] [address]  Fax: [phone]   Llana Aliment. Deterding, M.D.  [address]  [place]  [place] [address]  Fax: [phone]

## 2010-06-10 NOTE — Discharge Summary (Signed)
NAME:  [name], [name]                   ACCOUNT NO.:  [id number]   MEDICAL RECORD NO.:  [id number]          PATIENT TYPE:  IPS   LOCATION:  0506                          FACILITY[facility]  [facility]   PHYSICIAN:  Geoffery Lyons, M.D.      DATE OF BIRTH:  Dec 18, 1950   DATE OF ADMISSION:  12/22/2003  DATE OF DISCHARGE:  12/27/2003                                 DISCHARGE SUMMARY   CHIEF COMPLAINT AND PRESENT ILLNESS:  This was the third admission to [facility] for this 60 year old white widowed male voluntarily  admitted.  History of alcohol abuse.  Presented requesting detox.  Felt  depressed over the death of his wife from ovarian cancer October 1st after  three years of debilitating.  Has previously done well on Wellbutrin.  Denied any suicidal ideation.  Drinking 4-5 beers a night.  Also taking  Xanax 1 mg, 1-2 tabs.  Might be overusing.   PAST PSYCHIATRIC HISTORY:  Third time at [facility].  Admitted last  March of 2005.  Had been on Prozac and Zoloft made him too jittery.   ALCOHOL/DRUG HISTORY:  Alcohol abuse since age 20.  Longest sober not  available.   MEDICAL HISTORY:  Mitral valve prolapse, degenerative joint disease.   MEDICATIONS:  Xanax 1 mg three times a day (might be overusing) and  Lopressor.   PHYSICAL EXAMINATION:  Performed and failed to show any acute findings.   LABORATORY DATA:  Blood chemistry within normal limits.  Drug screen  negative for substances of abuse.  CBC within normal limits.   MENTAL STATUS EXAM:  Fully alert, cooperative male.  Somewhat anxious.  Speech normal rate, tempo and production.  Cooperative.  Appropriate.  Mood  depressed.  Affect depressed, anxious.  Thought processes logical, coherent  and relevant.  No evidence of delusions.  No suicidal or homicidal ideation.  No hallucinations.  Cognition was well-preserved.   ADMISSION DIAGNOSES:   AXIS I:  1.  Alcohol abuse; rule out dependence.  2.  Depressive disorder not  otherwise specified.   AXIS II:  No diagnosis.   AXIS III:  1.  Mitral valve prolapse.  2.  Degenerative joint disease.   AXIS IV:  Moderate.   AXIS V:  Global Assessment of Functioning upon admission 30; highest Global  Assessment of Functioning in the last year 62-65.   HOSPITAL COURSE:  He was admitted and started in individual and group  psychotherapy.  He was given trazodone for sleep.  He was detoxified with  [facility].  He endorsed that he was actively using alcohol when he requested  detox.  In the past, he has been offered a bed at [facility] in [facility] but he did not pursue.  Endorsed he was feeling depressed and  difficulty with dealing with his wife's death.  Sense of being overwhelmed.  He experienced some migraines that were treated symptomatically.  Endorsed  he was not feeling well, still wanting to go into rehab.  He apparently had  been inpatient several times in the last few months.  The possibility of  going to [facility] was not that high.  He started looking into [facility].  He was able to secure a place in one of the [facility]  On  December 4th, he was in full contact with reality.  No suicidal ideation.  No homicidal ideation.  No hallucinations.  No delusions.  Wanting to pursue  further outpatient treatment and work on a long-term abstinence program.   DISCHARGE DIAGNOSES:   AXIS I:  1.  Depressive disorder not otherwise specified.  2.  Alcohol dependence.   AXIS II:  No diagnosis.   AXIS III:  1.  Mitral valve prolapse.  2.  Degenerative joint disease.   AXIS IV:  Moderate.   AXIS V:  Global Assessment of Functioning upon discharge 60.   DISCHARGE MEDICATIONS:  Vistaril 50 mg, 1 three times a day as needed for  anxiety.   FOLLOWUP:  Still to call Mr. __________ at [facility] but to be placed  in a halfway house.     Farrel Gordon   IL/MEDQ  D:  01/20/2004  T:  01/20/2004  Job:  [id number]

## 2010-06-10 NOTE — H&P (Signed)
NAME:  [name], [name]                   ACCOUNT NO.:  [id number]   MEDICAL RECORD NO.:  [id number]           PATIENT TYPE:   LOCATION:                               FACILITY:  [facility]   PHYSICIAN:  Hettie Holstein, D.O.    DATE OF BIRTH:  1950/09/05   DATE OF ADMISSION:  08/28/2005  DATE OF DISCHARGE:                                HISTORY & PHYSICAL   PRIMARY CARE PHYSICIAN:  Unassigned.   CHIEF COMPLAINT:  Chest pain.   HISTORY OF PRESENTING ILLNESS:  [name] is a pleasant 60 year old male with  a history of remote alcohol abuse and bipolar disorder, managed by  psychiatrist, Dr. Hortencia Pilar, who had been in his usual state of health, up  until the past couple of days.  Yesterday, he developed some nausea  associated with chest pain.  In addition, he describes panic attacks.  He  states that these last for about an hour and then they are over.  These have  kept him up all this past evening and today, sleeping for a couple of hours  and waking up, he has been feeling panicky.  He has this heavy sensation  in the center of his chest that is relieved with deep inspiration.  He  states that he walks to relieve his discomfort.  In any event, he states  that he had an episode about an hour and a half ago.  He feels these are  related to anxiety and panic attacks, though he is unable to tolerate this  at home.  He is being admitted for further evaluation and management.  His  point-of-care markers in the department are negative and his EKG does not  reveal evidence of acute ischemia.  He does have a prior history of mitral  valve prolapse.   PAST MEDICAL HISTORY:  1.  Significant for seizure 11 years ago.  2.  Mitral valve prolapse.  3.  History of alcohol abuse, though abstinent for over a year now.  4.  History of depression and bipolar disorder, treated by Dr. Hortencia Pilar.  5.  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  6.  Patella reconstruction by Dr. Ranell Patrick.  7.  He had a hip  replacement in 2003.   MEDICATIONS:  1.  Lithium 300 mg p.o. q.a.m.  2.  Seroquel 200 mg p.o. nightly.   He is in the process of requesting his psychiatrist to transition him to  Wellbutrin to assist in smoking cessation.  He has had otherwise no recent  changes in his regimen.   ALLERGIES:  CODEINE.   SOCIAL HISTORY:  The patient is homosexual.  He smokes about a half a pack  of cigarettes per day.  He is in the process or obtaining his Environmental health practitioner.  He denies recent alcohol, has no children and is not  married.   FAMILY HISTORY:  His mother had an MI in her late 61s.  His father died with  prostate cancer at age 58.  I believe his mother suffered with bipolar  disorder as well.  REVIEW OF SYSTEMS:  He had been in his usual state of health.  He walks  about 2 miles every other day.   PHYSICAL EXAMINATION:  VITAL SIGNS:  Stable, blood pressure 116/88,  temperature 97.8, heart rate 78, respirations 21, O2 saturation 100%.  GENERAL:  The patient is alert, in no acute distress.  HEENT:  Head normocephalic, atraumatic.  Extraocular muscles are intact.  NECK:  Supple and nontender.  No palpable thyromegaly or mass.  CARDIOVASCULAR:  Exam revealed normal S1 and S2 without appreciable murmur.  LUNGS:  Clear to auscultation bilaterally.  He exhibits normal effort.  There is no dullness to percussion.  ABDOMEN:  Soft and nontender.  No palpable hepatosplenomegaly or mass.  Bowel sounds are normoactive.  EXTREMITIES:  Lower extremities reveal no edema and there is no calf  tenderness.  Peripheral pulses are symmetrical and palpable bilaterally.  NEUROLOGIC:  Exam reveals his mood to be euthymic and his affect was stable.   LABORATORY DATA:  Sodium 137, potassium 5.2, BUN 6, creatinine 1.2, glucose  73.  WBC count of 15.3, hemoglobin 14.4, platelet count of 340,000.  D-dimer  was negative.  Point-of-care markers were negative  INR is 1.0.  Lithium was  subtherapeutic.    RADIOLOGIC FINDINGS:  His chest x-ray revealed no acute findings.   ASSESSMENT:  1.  Anxiety/panic attacks, decompensated.  2.  Chest pain.   PLAN:  At this time we are going to admit [name] for 23-hour observation,  increase his anxiolytics and anxiety management.  We will add Klonopin.  We  will cycle his cardiac markers, follow his clunial course and discharge him  if he reveals no evidence of acute cardiac issues and his anxiety is more  optimally controlled.   DICTATION ENDED AT THIS POINT.      Hettie Holstein, D.O.  Electronically Signed     ESS/MEDQ  D:  08/29/2005  T:  08/29/2005  Job:  [id number]

## 2010-06-10 NOTE — H&P (Signed)
[name], [name] NO.:  [id number]   MEDICAL RECORD NO.:  [id number]          PATIENT TYPE:  IPS   LOCATION:  0505                          FACILITY:  [facility]   PHYSICIAN:  Jasmine Pang, M.D. DATE OF BIRTH:  [birth year]   DATE OF ADMISSION:  09/22/2005  DATE OF DISCHARGE:                         PSYCHIATRIC ADMISSION ASSESSMENT   This is a voluntary admission to the services of Dr. Milford Cage.   This is a 60 year old single white male.  Apparently he presented as a walk-  in yesterday.  He  reported having been very depressed the last few months.  It has been worsening over the past week.  He has only  had 2-4 hours of  sleep over the past four days.  He states he has only eaten one sandwich in  that time.  He states I feel very sad and lethargic and I don't know why.  He was feeling shaky, nervous and lethargic.  He acknowledges auditory  hallucinations when  he lies down.  He states that in the past three months  he has heard conversations, singing at a distance and he has gotten out of  bed to check on them.   PAST PSYCHIATRIC HISTORY:  He is well known to [place]. He has had at least four  hospitalizations although not recently.  His last inpatient stay with [place] was  February 15 to March 14, 2004.  He has also been an inpatient at  __________ and [facility].   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  He is working on getting his IT trainer.  He is homosexual.  He has no active  relationship, has not had one for the past ten years.  He does, however,  have a male roommate in his apartment.   FAMILY HISTORY:  He states his mother was bipolar.   ALCOHOL AND DRUG HISTORY:  He has been sober almost 18 months.   Primary care Nehemie Casserly--he needs to find a primary.   MEDICAL PROBLEMS:  He recently was admitted. This August 7 to September 01, 2005.  He presented with a history of chest pain.  He underwent a three  vessel  angioplasty to the right coronary artery and some other places.  He  was also treated for a left axillary abscess.  This required an I&D and he  was treated with Septra p.o.  He has tobacco abuse as an issue.  He has a  remote history for seizure disorder.  He has a history for mitral valve  prolapse.  He is status post a right shoulder replacement in April 2007, a  patella reconstruction and history of hip replacement in 2003.   MEDICATIONS:  He was discharged on Toprol XL 25 mg p.o. q.d., aspirin 325 mg  p.o. q.d., Zocor  20 mg p.o. q.d., nitroglycerin 0.4 mg sublingually as  needed for chest pain with one tab every 5 minutes for a total of three.  He  was instructed to call EMS or his M.D. if he uses  this medication.  Lithium-  -he states he was taking 300 mg p.o. q.d. and Lexapro 10 mg p.o. q.d.   DRUG ALLERGIES:  CODEINE AND PERCOCET; THEY CAUSE ITCHING.   PHYSICAL EXAMINATION:  GENERAL:  He appears older than his stated age.  VITAL SIGNS:  On admission he is 68 inches tall,  he weighs 161 pounds,  temperature is 98.1,blood pressure 134/36 to 118/88, pulse was 75 to 83,  respirations 20.  EXTREMITIES:  He has a  cast on his right lower extremity.  Apparently this  was placed approximately a week ago by Dr. Luiz Blare and he does have a  followup appointment next Thursday at [facility].  Chances are he  will be discharged at that time.  The remainder of his physical examination  was unremarkable for our purposes.   ADMISSION LABS:  He is slightly anemic at 12.8, 38.2.  This is not new.  His  lithium level was subtherapeutic at 0.26.  His thyroid was within range at  0.884.  His urine was positive for benzodiazepines, however, he notes that  he was  given Ambien for sleep.   MENTAL STATUS EXAM:  He is alert and oriented x3.  He is casually groomed  and dressed, appears to be adequately nourished.  His speech was not  pressured, his mood is somewhat depressed, his affect is  congruent.  Thought  processes are clear, rational and goal oriented.  He would like to have  Lexapro and lithium stopped and  go back on Wellbutrin.  Judgment and  insight are intact.  Concentration and memory are intact.  He is not  suicidal or homicidal and he does have auditory hallucinations, as already  described.   AXIS I  Bipolar, severe with auditory hallucinations.  AXIS II  Deferred.  AXIS III  Recent angioplasty, remote history for seizure disorder, mitral valve  prolapse, history for alcohol abuse over about 18 months, status post right  shoulder replacement in April 2007, patella reconstruction and history of  hip replacement in 2003.  Right lower extremity cast for fracture of one of  the bones in his heel.  AXIS IV  Mild.  AXIS V  35.   PLAN:  Admit for ___________stabilization, to adjust his meds as indicated.  Toward that end, we will go ahead and stop the lithium and the Lexapro as  requested by the patient and  re-start Wellbutrin.      Mickie Leonarda Salon, P.A.-C.      Jasmine Pang, M.D.  Electronically Signed    MD/MEDQ  D:  09/23/2005  T:  09/23/2005  Job:  [id number]

## 2010-06-10 NOTE — Op Note (Signed)
NAME:  [name], [name]                             ACCOUNT NO.:  [id number]   MEDICAL RECORD NO.:  [id number]                   PATIENT TYPE:  INP   LOCATION:  5032                                 FACILITY:  [facility]   PHYSICIAN:  Georges Lynch. Gioffre, M.D.             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   PROCEDURE:  Right total hip arthroplasty utilizing the PCL microstructured  cup, a size 50.  The insert was a 10 degree 32 mm insert.  The two  cancellous bone screws utilized to stabilize the acetabular shell, and some  bone graft was placed behind the shell.  The femoral component was a  SecureFit Plus size 10 stem, and we utilized the +0 C-tapered head.   SURGEON:  Georges Lynch. Darrelyn Hillock, M.D.   ASSISTANT:  Marlowe Kays, M.D.   DESCRIPTION OF PROCEDURE:  A routine orthopedic prep and drape of the right  hip was carried out.  The patient had 1 g of IV Ancef.  At this time a  posterolateral approach to the hip was carried out, bleeders identified and  cauterized, and self-retaining retractors were inserted.  I then went down  and incised the iliotibial band.  Great care was taken to protect the  sciatic nerve.  I then detached the external rotators.  Once again great  care was taken to protect the sciatic nerve.  At this time I did a  capsulectomy.  I dislocated the head.  Note HALF OF THE HEAD remained  behind.  It was severely aseptic necrotic, and it simply just was in pieces  in the acetabulum.  We removed those pieces from the acetabulum, cleaned the  acetabulum out, and then cut our femoral neck at the appropriate length.  We  then inserted our guide pin and made our initial drill cut in  the proximal  femur and then reamed and rasped up to a size 10 SecureFit stem.  We reamed  distally to a 14.5 mm tip.  After the femur was prepared, we completed our  capsulectomy, reamed the acetabulum to a size 50 mm PSL cup, and following  that we then went through our trials.  After the trials we selected a 50 mm  cup, a +0 C-tapered head, and a size 10 SecureFit stem.  We thoroughly  irrigated the area out and then inserted our permanent PSL cup size 50 with  two screws for fixation.  Following that we then after inserting the  permanent cup, we inserted our trial insert, again went through trials once  again for our insert placement location.  Once  this was established, we  inserted our permanent 10 degree Series 2 cup.  We then inserted our  SecureFit stem size 10 into the femur, went through trials once again, and  selected a +0 C-tapered head.  We then inserted our permanent C-tapered  head, reduced the hip, and had excellent function, excellent stability.  Note we utilized a 32 MM DIAMETER C-TAPERED HEAD.  Once this was done we  then irrigated the area out, reapproximated our rotators, and closed the  remaining part of our wound in layers in the usual fashion over a Hemovac  drain, staples were placed in the skin.  A sterile Neosporin dressing was  applied.  The patient left the operating room in satisfactory condition.                                               Ronald A. Darrelyn Hillock, M.D.    RAG/MEDQ  D:  11/05/2001  T:  11/06/2001  Job:  [id number]

## 2010-08-31 ENCOUNTER — Inpatient Hospital Stay (HOSPITAL_COMMUNITY)
Admission: EM | Admit: 2010-08-31 | Discharge: 2010-09-01 | DRG: 313 | Disposition: A | Discharge status: Home / Self Care | Payer: Medicare Other | Attending: Cardiology | Admitting: Cardiology

## 2010-08-31 ENCOUNTER — Emergency Department (HOSPITAL_COMMUNITY): Payer: Medicare Other

## 2010-08-31 DIAGNOSIS — Z7982 Long term (current) use of aspirin: Secondary | ICD-10-CM

## 2010-08-31 DIAGNOSIS — R079 Chest pain, unspecified: Secondary | ICD-10-CM

## 2010-08-31 DIAGNOSIS — R0789 Other chest pain: Principal | ICD-10-CM | POA: Diagnosis present

## 2010-08-31 DIAGNOSIS — F411 Generalized anxiety disorder: Secondary | ICD-10-CM | POA: Diagnosis present

## 2010-08-31 DIAGNOSIS — I2510 Atherosclerotic heart disease of native coronary artery without angina pectoris: Secondary | ICD-10-CM | POA: Diagnosis present

## 2010-08-31 DIAGNOSIS — E785 Hyperlipidemia, unspecified: Secondary | ICD-10-CM | POA: Diagnosis present

## 2010-08-31 DIAGNOSIS — Z9861 Coronary angioplasty status: Secondary | ICD-10-CM

## 2010-08-31 DIAGNOSIS — I10 Essential (primary) hypertension: Secondary | ICD-10-CM | POA: Diagnosis present

## 2010-08-31 DIAGNOSIS — F319 Bipolar disorder, unspecified: Secondary | ICD-10-CM | POA: Diagnosis present

## 2010-08-31 DIAGNOSIS — I059 Rheumatic mitral valve disease, unspecified: Secondary | ICD-10-CM | POA: Diagnosis present

## 2010-08-31 LAB — DIFFERENTIAL
Basophils Absolute: 0 10*3/uL (ref 0.0–0.1)
Basophils Relative: 0 % (ref 0–1)
Eosinophils Absolute: 1 10*3/uL — ABNORMAL HIGH (ref 0.0–0.7)
Monocytes Relative: 7 % (ref 3–12)
Neutro Abs: 3.7 10*3/uL (ref 1.7–7.7)
Neutrophils Relative %: 39 % — ABNORMAL LOW (ref 43–77)

## 2010-08-31 LAB — CBC
Hemoglobin: 13.1 g/dL (ref 13.0–17.0)
Platelets: 282 10*3/uL (ref 150–400)
RBC: 5.05 MIL/uL (ref 4.22–5.81)
WBC: 9.7 10*3/uL (ref 4.0–10.5)

## 2010-08-31 LAB — BASIC METABOLIC PANEL
BUN: 15 mg/dL (ref 6–23)
Chloride: 101 mEq/L (ref 96–112)
GFR calc Af Amer: >60 mL/min (ref >60)
GFR calc non Af Amer: >60 mL/min (ref >60)
Potassium: 4.3 mEq/L (ref 3.5–5.1)
Sodium: 139 mEq/L (ref 135–145)

## 2010-08-31 LAB — POCT I-STAT TROPONIN I: Troponin i, poc: 0 ng/mL (ref 0.00–0.08)

## 2010-09-01 DIAGNOSIS — R079 Chest pain, unspecified: Secondary | ICD-10-CM

## 2010-09-01 LAB — TSH: TSH: 4.161 u[IU]/mL (ref 0.350–4.500)

## 2010-09-01 LAB — LIPID PANEL
Cholesterol: 234 mg/dL — ABNORMAL HIGH (ref 0–200)
LDL Cholesterol: 153 mg/dL — ABNORMAL HIGH (ref 0–99)
Total CHOL/HDL Ratio: 4 RATIO
VLDL: 23 mg/dL (ref 0–40)

## 2010-09-01 LAB — PROTIME-INR: Prothrombin Time: 13.5 seconds (ref 11.6–15.2)

## 2010-09-01 LAB — CARDIAC PANEL(CRET KIN+CKTOT+MB+TROPI)
CK, MB: 2.7 ng/mL (ref 0.3–4.0)
Relative Index: 2 (ref 0.0–2.5)
Total CK: 150 U/L (ref 7–232)
Total CK: 143 U/L (ref 7–232)

## 2010-09-05 NOTE — Discharge Summary (Addendum)
[name], [name] NO.:  [id number]  MEDICAL RECORD NO.:  [id number]  LOCATION:  2022                         FACILITY:  [facility]  PHYSICIAN:  Veverly Fells. Excell Seltzer, MD  DATE OF BIRTH:  1950-11-04  DATE OF ADMISSION:  08/31/2010 DATE OF DISCHARGE:  09/01/2010                              DISCHARGE SUMMARY   DISCHARGE DIAGNOSES: 1. Chest pain without objective evidence of ischemia, felt     predominantly atypical.     a.     Negative cardiac enzymes.     b.     For outpatient stress Myoview, will follow up with Dr.      [place]. 2. Mitral valve prolapse/palpitation. 3. Anxiety. 4. Hypertension, with markedly elevated blood pressure on admission,     lisinopril initiated. 5. Hyperlipidemia. 6. Questionable bipolar disorder. 7. History of coronary artery disease, status post percutaneous     transluminal coronary angioplasty of the right coronary artery in     2007.  HOSPITAL COURSE:  [name] is a 60 year old gentleman with history of known coronary artery disease, who presented to the [facility] with complaints of chest discomfort and worsening exercise intolerance. He has been exercising at the gym regularly and has been able to perform 45 minutes of physical activity without difficulty.  However, at the gym day before admission, he was only able to perform 15 minutes prior to becoming excessively short of breath.  He also endorses against occasional chest discomfort over the past couple of days.  He has 6-7 hours unremitting pressure-like chest pain on day of admission and thus presented to the ER.  His initial troponin was negative.  EKG demonstrated no acute changes.  He was admitted to the telemetry unit overnight.  His Toprol was preliminarily increased, although at discharge, this was just dropped down to 25 mg daily with the patient's baseline heart rate being in the 50s.  Lisinopril 10 mg daily was started for improved blood pressure control  as his pressure is slightly elevated on admission 189/129, which was felt to have some contribution to his symptoms.  On day of discharge, the patient's blood pressure is 134/88 and he feels better.  Dr. Excell Seltzer feels this was the pain that is predominantly atypical, but favor an outpatient stress test, was to follow up with doctor during thereafter.  Dr. Excell Seltzer has seen and examined him today and feels that he is stable for discharge.  DISCHARGE LABS:  WBC is 9.7, hemoglobin 13.1, hematocrit 39.4, platelet count 282.  Please note, eosinophils and lymphocytes were up on his CBC. Sodium 139, potassium 4.3, chloride 101, CO2 25, glucose was 54 on admission, BUN 16, and creatinine 1.02.  Cardiac enzymes negative x2. Total cholesterol 234, triglycerides 115, HDL 58, LDL 153.  STUDIES:  Chest x-ray on August 31, 2010, showed no acute cardiopulmonary disease.  The patient has stable remote right-sided rib fractures and thoracic compression fracture.  DISCHARGE MEDIATIONS: 1. Lisinopril 10 mg daily. 2. Nitroglycerin sublingual 0.4 mg every 5 minutes as needed up to 3     doses for chest pain. 3. Ibuprofen.  The patient was apparently taking this at home  and was     instructed to try stopping this medicine to see if his symptoms get     better, as some of his discomfort can also limit chest pain. 4. Toprol-XL 25 mg daily. 5. Aspirin 325 mg daily. 6. Ativan 2 mg b.i.d. 7. Bupropion 300 mg every morning. 8. Crestor 20 mg every morning. 9. Loratadine 10 mg daily as needed for allergies. 10.Sonata 10 mg daily nightly.  DISPOSITION:  [name] will be discharged in stable condition to home. He is instructed to increase activity slowly and to follow a low-sodium heart healthy diet.  He will have a stress Myoview at [facility] office on September 13, 2010, at 8:45 a.m.  He will follow up with Dr. Elvis Coil office at [facility] to see Dr. Norma Fredrickson first. First postop visit was  September 14, 2010, at 9:15.  Given the initiation of lisinopril, he will also likely need a BMET at some point to evaluate stability of his electrolytes.  We will also check a glucose level prior to discharge given his low glucose level on admission.  DURATION OF DISCHARGE ENCOUNTER:  Greater than 30 minutes including physician and PA time.     Ronie Spies, P.A.C.   ______________________________ Veverly Fells. Excell Seltzer, MD    DD/MEDQ  D:  09/01/2010  T:  09/01/2010  Job:  [id number]  Electronically Signed by Ronie Spies  on 09/05/2010 01:16:52 PM Electronically Signed by Tonny Bollman MD on 09/06/2010 05:26:45 AM

## 2010-09-09 ENCOUNTER — Encounter: Payer: Self-pay | Admitting: Nurse Practitioner

## 2010-09-13 ENCOUNTER — Encounter (HOSPITAL_COMMUNITY): Payer: Medicare Other | Admitting: Radiology

## 2010-09-14 ENCOUNTER — Ambulatory Visit: Payer: Medicare Other | Admitting: Nurse Practitioner

## 2010-09-20 ENCOUNTER — Encounter (HOSPITAL_COMMUNITY): Payer: Self-pay | Admitting: Radiology

## 2010-09-21 ENCOUNTER — Ambulatory Visit: Payer: Self-pay | Admitting: Nurse Practitioner

## 2010-09-27 ENCOUNTER — Ambulatory Visit (INDEPENDENT_AMBULATORY_CARE_PROVIDER_SITE_OTHER): Payer: Medicare Other | Admitting: Nurse Practitioner

## 2010-09-27 ENCOUNTER — Encounter: Payer: Self-pay | Admitting: Nurse Practitioner

## 2010-09-27 DIAGNOSIS — I2510 Atherosclerotic heart disease of native coronary artery without angina pectoris: Secondary | ICD-10-CM

## 2010-09-27 DIAGNOSIS — I10 Essential (primary) hypertension: Secondary | ICD-10-CM

## 2010-09-27 DIAGNOSIS — E785 Hyperlipidemia, unspecified: Secondary | ICD-10-CM

## 2010-09-27 DIAGNOSIS — R079 Chest pain, unspecified: Secondary | ICD-10-CM

## 2010-09-27 MED ORDER — ATORVASTATIN CALCIUM 40 MG PO TABS: 40.0000 mg | ORAL_TABLET | Freq: Every day | ORAL | Status: DC

## 2010-09-27 NOTE — Assessment & Plan Note (Signed)
He has had remote PCI. He was admitted last month with atypical symptoms. Evaluation at that time was ok. He is not smoking and overall seems to be taking better care of himself.

## 2010-09-27 NOTE — Assessment & Plan Note (Signed)
Blood pressure is still up some but reportedly good as an outpatient. He is willing to monitor and cut back on his ibuprofen use. I will see him again in a month. Patient is agreeable to this plan and will call if any problems develop in the interim.

## 2010-09-27 NOTE — Patient Instructions (Addendum)
Try to cut back on your ibuprofen. It can make your blood pressure go up. Check your blood pressure at home and keep a diary Finish your Crestor. Then start Lipitor 40 mg daily. I sent a prescription to the drug store. I want to see you in a month Call for any problems.

## 2010-09-27 NOTE — Progress Notes (Signed)
    [name] Date of Birth: 02/08/1950   History of Present Illness: [name] is seen back today for his post hospital visit. He is seen for Dr. [place]. He was admitted last month with atypical chest pain. His blood pressure was up. He had a negative evaluation. He was referred for outpatient stress testing. He did not show up for his study. He has had no further problems. He thinks it was all stress induced. He did not get the prescription for the Lisinopril filled. Blood pressure is up a little here but very good at the gym. He is taking "alot" of ibuprofen. He is not smoking. He is doing Garment/textile technologist for weight loss. He is exercising and feeling good.   Current Outpatient Prescriptions on File Prior to Visit  Medication Sig Dispense Refill  . aspirin 325 MG tablet Take 325 mg by mouth daily.        Marland Kitchen buPROPion (WELLBUTRIN XL) 300 MG 24 hr tablet Take 300 mg by mouth daily.        Marland Kitchen ibuprofen (ADVIL,MOTRIN) 200 MG tablet Take 200 mg by mouth every 6 (six) hours as needed.        . loratadine (CLARITIN) 10 MG tablet Take 10 mg by mouth as needed.        . metoprolol succinate (TOPROL-XL) 25 MG 24 hr tablet Take 25 mg by mouth daily.        . nitroGLYCERIN (NITROSTAT) 0.4 MG SL tablet Place 0.4 mg under the tongue every 5 (five) minutes as needed.        . rosuvastatin (CRESTOR) 20 MG tablet Take 20 mg by mouth daily.        . zaleplon (SONATA) 10 MG capsule Take 10 mg by mouth at bedtime.          Allergies  Allergen Reactions  . Codeine     Itches    Past Medical History  Diagnosis Date  . Chest pain   . MVP (mitral valve prolapse)   . Anxiety   . Hypertension   . Coronary artery disease     s/p PCI to RCA in 2007  . Hyperlipidemia   . Palpitations     Past Surgical History  Procedure Date  . Coronary angioplasty 2007    RCA  . Cardiac catheterization 04/2008    EF 65%    History  Smoking status  . Former Smoker  Smokeless tobacco  . Not on file    History    Alcohol Use No    No family history on file.  Review of Systems: The review of systems is positive for some anxiety.  All other systems were reviewed and are negative.  Physical Exam: BP 140/106  Pulse 68  Wt 185 lb (83.915 kg) Patient is a little anxious but in no acute distress. Skin is warm and dry. Color is normal.  HEENT is unremarkable. Normocephalic/atraumatic. PERRL. Sclera are nonicteric. Neck is supple. No masses. No JVD. Lungs are clear. Cardiac exam shows a regular rate and rhythm. Abdomen is soft. Extremities are without edema. Gait and ROM are intact. No gross neurologic deficits noted.  LABORATORY DATA:   Assessment / Plan:

## 2010-09-27 NOTE — Assessment & Plan Note (Signed)
He wants to switch to generic Lipitor. Prescription is sent to the pharmacy. He will finish his current supply of Crestor. He will need labs in about 3 months after switching.

## 2010-10-12 LAB — DIFFERENTIAL
Basophils Absolute: 0
Basophils Relative: 1
Lymphocytes Relative: 33
Neutro Abs: 3
Neutrophils Relative %: 47

## 2010-10-12 LAB — COMPREHENSIVE METABOLIC PANEL
ALT: 13
AST: 17
Albumin: 4.9
Alkaline Phosphatase: 80
GFR calc Af Amer: >60
Glucose, Bld: 89
Potassium: 4.5
Sodium: 135
Total Protein: 7.8

## 2010-10-12 LAB — BASIC METABOLIC PANEL
CO2: 25
Chloride: 101
Creatinine, Ser: 1.06
GFR calc Af Amer: >60
Glucose, Bld: 82

## 2010-10-12 LAB — URINALYSIS, ROUTINE W REFLEX MICROSCOPIC
Bilirubin Urine: NEGATIVE
Ketones, ur: NEGATIVE
Nitrite: NEGATIVE
Protein, ur: NEGATIVE
pH: 6.5

## 2010-10-12 LAB — CBC
HCT: 28.8 — ABNORMAL LOW
Hemoglobin: 11 — ABNORMAL LOW
Hemoglobin: 7.4 — CL
MCHC: 31.7
MCV: 76.6 — ABNORMAL LOW
MCV: 80.3
Platelets: 160
Platelets: 288
RBC: 3.05 — ABNORMAL LOW
RDW: 18.9 — ABNORMAL HIGH
RDW: 19 — ABNORMAL HIGH
RDW: 19.3 — ABNORMAL HIGH

## 2010-10-12 LAB — PROTIME-INR
INR: 1
Prothrombin Time: 13.4
Prothrombin Time: 19.4 — ABNORMAL HIGH

## 2010-10-12 LAB — PREPARE RBC (CROSSMATCH)

## 2010-10-12 LAB — TYPE AND SCREEN
ABO/RH(D): O POS
Antibody Screen: NEGATIVE

## 2010-10-12 LAB — APTT: aPTT: 33

## 2010-10-13 LAB — CBC
MCHC: 32.7
Platelets: 174
RDW: 19.8 — ABNORMAL HIGH

## 2010-10-13 LAB — PROTIME-INR
INR: 2.5 — ABNORMAL HIGH
INR: 3 — ABNORMAL HIGH
Prothrombin Time: 28.1 — ABNORMAL HIGH
Prothrombin Time: 32 — ABNORMAL HIGH

## 2010-10-13 LAB — BASIC METABOLIC PANEL
BUN: 5 — ABNORMAL LOW
CO2: 22
Calcium: 8.8
Chloride: 103
Creatinine, Ser: 0.91
GFR calc Af Amer: >60
GFR calc non Af Amer: >60
Glucose, Bld: 112 — ABNORMAL HIGH
Potassium: 4.1
Sodium: 133 — ABNORMAL LOW

## 2010-10-16 NOTE — H&P (Signed)
NAMEGEOFFREY, HYNES NO.:  [id number]  MEDICAL RECORD NO.:  [id number]  LOCATION:  [facility]                         FACILITY:  [facility]  PHYSICIAN:  Henderson Cloud, MD     DATE OF BIRTH:  10-Jan-1951  DATE OF ADMISSION:  08/31/2010 DATE OF DISCHARGE:                             HISTORY & PHYSICAL   ATTENDING PHYSICIAN:  Dr. [place].  CHIEF COMPLAINT:  Chest pain.  HISTORY OF PRESENT ILLNESS:  The patient is a 60 year old white male with past medical history significant for coronary artery disease status post PTCA of the right coronary artery in 2007, anxiety, hypertension, hyperlipidemia, questionable bipolar who is presenting with worsening exercise tolerance and chest discomfort.  The patient states that he has been exercising at the gym regularly and been able to perform 45 minutes physical activity without difficulty.  Yesterday, while at the gym, he was only able to perform 15 minutes before becoming excessively short of breath.  He also endorses increasing chest discomfort over the past couple days.  Today, his chest discomfort which he describes as pressure lasted at least 6-7 hours without remitting.  In the emergency room, his troponin was unremarkable.  In addition to the above complaint, the patient endorses increasing palpitations.  PAST MEDICAL HISTORY:  As above in HPI.  His last left heart catheterization in April 2010, showed 50% mid LAD lesion, first diagonal with 80-90% stenosis, OM1 30-40% stenosis proximally, OM2 50-60% stenosis proximally, ejection fraction of 65%.  Other past medical history as in HPI.  SOCIAL HISTORY:  He quit smoking and he no longer drinks excessively. He lives at home with his significant other.  FAMILY HISTORY:  Noncontributory.  ALLERGIES:  CODEINE.  MEDICATIONS: 1. Aspirin 325 daily. 2. Ativan 2 mg b.i.d. p.r.n. 3. Bupropion 300 mg daily. 4. Crestor 200 mg every evening. 5. Sonata 10 mg every evening. 6.  Toprol-XL 2.5 mg daily.  REVIEW OF SYSTEMS:  As in HPI.  He also endorses anxiety and palpitations at often times occur 30 minutes after eating.  Other systems as in HPI, otherwise negative.  PHYSICAL EXAMINATION:  VITAL SIGNS:  Temperature 98, heart rate 104, respiratory rate is 14, blood pressure 189/129. GENERAL:  No acute distress. HEENT:  Normocephalic, atraumatic. HEART:  Regular rhythm without murmur. LUNGS:  Clear bilaterally. ABDOMEN:  Soft, nontender, and nondistended. EXTREMITIES:  Without edema. SKIN:  Warm and dry. PSYCHIATRIC:  Patient is appropriate. NEUROLOGIC:  Nonfocal. MUSCULOSKELETAL:  5/5 bilateral upper and lower extremity strength.  LABORATORY DATA:  Sodium 134, potassium 4.3, chloride 101, CO2 of 25, BUN 15, creatinine 1, glucose 64.  White count 10, hemoglobin 13, hematocrit 40, platelet count 282, troponin is 0.00.  Chest x-ray shows no acute process and stable remote right-sided rib fractures.  ASSESSMENT:  Atypical chest pain in a patient with a history of coronary disease.  The fact patient has a normal troponin after approximately 6-7 hours of chest discomfort make acute coronary syndrome less likely.  He has poorly-controlled blood pressure which could easily be responsible for increased dyspnea he has been experiencing with exercise.  That being said, he does have a history of coronary  artery disease and his last heart catheterization indicated some evidence of obstructive disease.  He will be admitted to telemetry unit and ruled out for myocardial infarction.  His home medications will be continued with the exception of his Toprol-XL which will be increased to 50 mg daily.  We will give him a dose of Lopressor 25 mg now.  I will also start him on lisinopril 10 mg daily.  He will be made n.p.o. for a possible exercise stress test.     Henderson Cloud, MD     SGA/MEDQ  D:  09/01/2010  T:  09/01/2010  Job:  [id number]  Electronically Signed by  Raynelle Bring MD on 10/16/2010 09:57:07 AM

## 2010-10-25 ENCOUNTER — Ambulatory Visit: Payer: Medicare Other | Admitting: Nurse Practitioner

## 2010-10-25 LAB — TRICYCLICS SCREEN, URINE: TCA Scrn: POSITIVE — AB

## 2010-10-25 LAB — POCT CARDIAC MARKERS
CKMB, poc: <1 — ABNORMAL LOW
Myoglobin, poc: 58.4
Troponin i, poc: <0.05

## 2010-10-25 LAB — RAPID URINE DRUG SCREEN, HOSP PERFORMED
Amphetamines: NOT DETECTED
Barbiturates: NOT DETECTED
Benzodiazepines: NOT DETECTED

## 2010-10-25 LAB — COMPREHENSIVE METABOLIC PANEL
ALT: 17
BUN: 18
Calcium: 9.7
Glucose, Bld: 108 — ABNORMAL HIGH
Sodium: 137
Total Protein: 7.1

## 2010-10-25 LAB — URINALYSIS, ROUTINE W REFLEX MICROSCOPIC
Bilirubin Urine: NEGATIVE
Glucose, UA: NEGATIVE
Hgb urine dipstick: NEGATIVE
Ketones, ur: NEGATIVE
Protein, ur: NEGATIVE

## 2010-10-25 LAB — ACETAMINOPHEN LEVEL: Acetaminophen (Tylenol), Serum: <10 — ABNORMAL LOW

## 2010-10-25 LAB — CBC
Hemoglobin: 12.3 — ABNORMAL LOW
MCHC: 32.7
RDW: 16 — ABNORMAL HIGH

## 2010-10-25 LAB — DIFFERENTIAL
Lymphocytes Relative: 14
Lymphs Abs: 1.5
Monocytes Relative: 5
Neutro Abs: 8.4 — ABNORMAL HIGH
Neutrophils Relative %: 80 — ABNORMAL HIGH

## 2010-10-31 LAB — COMPREHENSIVE METABOLIC PANEL
AST: 19
Albumin: 4.3
Calcium: 9.7
Creatinine, Ser: 1.05
GFR calc Af Amer: >60
GFR calc non Af Amer: >60

## 2010-10-31 LAB — DIFFERENTIAL
Basophils Absolute: 0.1
Basophils Relative: 2 — ABNORMAL HIGH
Eosinophils Absolute: 0.2
Eosinophils Relative: 3
Lymphocytes Relative: 29
Lymphs Abs: 2
Monocytes Absolute: 0.6
Monocytes Relative: 9
Neutro Abs: 3.8
Neutrophils Relative %: 56

## 2010-10-31 LAB — APTT: aPTT: 34

## 2010-10-31 LAB — URINALYSIS, ROUTINE W REFLEX MICROSCOPIC
Bilirubin Urine: NEGATIVE
Glucose, UA: NEGATIVE
Hgb urine dipstick: NEGATIVE
Specific Gravity, Urine: 1.019

## 2010-10-31 LAB — PROTIME-INR
INR: 1
Prothrombin Time: 13.2

## 2010-10-31 LAB — CBC
MCHC: 32.1
MCV: 79.6
Platelets: 403 — ABNORMAL HIGH

## 2010-10-31 LAB — TYPE AND SCREEN: ABO/RH(D): O POS

## 2010-11-01 LAB — COMPREHENSIVE METABOLIC PANEL
AST: 40 — ABNORMAL HIGH
BUN: 8
CO2: 19
Chloride: 100
Creatinine, Ser: 0.87
GFR calc non Af Amer: >60
Glucose, Bld: 94
Total Bilirubin: 1.3 — ABNORMAL HIGH

## 2010-11-01 LAB — CBC
HCT: 34.8 — ABNORMAL LOW
Hemoglobin: 11.5 — ABNORMAL LOW
MCV: 81.5
RBC: 4.27
WBC: 15.4 — ABNORMAL HIGH

## 2010-11-01 LAB — RAPID URINE DRUG SCREEN, HOSP PERFORMED
Benzodiazepines: POSITIVE — AB
Cocaine: NOT DETECTED
Tetrahydrocannabinol: NOT DETECTED

## 2010-11-01 LAB — ETHANOL: Alcohol, Ethyl (B): 133 — ABNORMAL HIGH

## 2010-11-01 LAB — POCT CARDIAC MARKERS
Operator id: 4533
Troponin i, poc: <0.05

## 2010-11-01 LAB — DIFFERENTIAL
Basophils Absolute: 0.4 — ABNORMAL HIGH
Eosinophils Relative: 1
Lymphocytes Relative: 26
Neutrophils Relative %: 67

## 2010-11-02 LAB — DIFFERENTIAL
Basophils Absolute: 0.1
Lymphocytes Relative: 37
Neutro Abs: 3.2

## 2010-11-02 LAB — POCT I-STAT CREATININE
Creatinine, Ser: 1.2
Operator id: 272551

## 2010-11-02 LAB — CBC
Hemoglobin: 11.3 — ABNORMAL LOW
Platelets: 423 — ABNORMAL HIGH
RDW: 19.9 — ABNORMAL HIGH

## 2010-11-02 LAB — I-STAT 8, (EC8 V) (CONVERTED LAB)
Bicarbonate: 19.8 — ABNORMAL LOW
HCT: 41
Hemoglobin: 13.9
Operator id: 272551
Sodium: 136
TCO2: 21

## 2010-11-03 LAB — CBC
HCT: 24.8 — ABNORMAL LOW
HCT: 25.1 — ABNORMAL LOW
HCT: 25.1 — ABNORMAL LOW
HCT: 25.5 — ABNORMAL LOW
HCT: 26.5 — ABNORMAL LOW
HCT: 27.1 — ABNORMAL LOW
HCT: 27.5 — ABNORMAL LOW
HCT: 28 — ABNORMAL LOW
HCT: 31.3 — ABNORMAL LOW
Hemoglobin: 10.4 — ABNORMAL LOW
Hemoglobin: 8.2 — ABNORMAL LOW
Hemoglobin: 8.3 — ABNORMAL LOW
Hemoglobin: 8.3 — ABNORMAL LOW
Hemoglobin: 8.6 — ABNORMAL LOW
Hemoglobin: 8.6 — ABNORMAL LOW
Hemoglobin: 9.3 — ABNORMAL LOW
Hemoglobin: 9.4 — ABNORMAL LOW
MCHC: 32.6
MCHC: 33
MCHC: 33
MCHC: 33.1
MCHC: 33.1
MCHC: 33.2
MCHC: 33.5
MCHC: 33.5
MCV: 85.7
MCV: 86.3
MCV: 86.5
MCV: 86.6
MCV: 86.6
MCV: 86.9
MCV: 87
MCV: 87.1
MCV: 87.3
MCV: 87.4
MCV: 87.5
Platelets: 208
Platelets: 239
Platelets: 251
Platelets: 252
Platelets: 258
Platelets: 259
Platelets: 269
Platelets: 283
Platelets: 296
Platelets: ADEQUATE
RBC: 2.85 — ABNORMAL LOW
RBC: 2.87 — ABNORMAL LOW
RBC: 2.96 — ABNORMAL LOW
RBC: 2.98 — ABNORMAL LOW
RBC: 3.15 — ABNORMAL LOW
RBC: 3.22 — ABNORMAL LOW
RBC: 3.3 — ABNORMAL LOW
RBC: 3.32 — ABNORMAL LOW
RBC: 3.65 — ABNORMAL LOW
RDW: 18.5 — ABNORMAL HIGH
RDW: 18.6 — ABNORMAL HIGH
RDW: 18.8 — ABNORMAL HIGH
RDW: 19 — ABNORMAL HIGH
RDW: 19.1 — ABNORMAL HIGH
RDW: 19.2 — ABNORMAL HIGH
RDW: 19.2 — ABNORMAL HIGH
RDW: 19.2 — ABNORMAL HIGH
RDW: 19.5 — ABNORMAL HIGH
RDW: 19.7 — ABNORMAL HIGH
RDW: 19.7 — ABNORMAL HIGH
RDW: 21.4 — ABNORMAL HIGH
WBC: 11.6 — ABNORMAL HIGH
WBC: 6.9
WBC: 6.9
WBC: 7.6
WBC: 7.7
WBC: 7.9
WBC: 8.2
WBC: 8.7

## 2010-11-03 LAB — COMPREHENSIVE METABOLIC PANEL
ALT: 19
AST: 18
Calcium: 9
Creatinine, Ser: 1.12
GFR calc Af Amer: >60
Glucose, Bld: 155 — ABNORMAL HIGH
Sodium: 140
Total Protein: 6

## 2010-11-03 LAB — BASIC METABOLIC PANEL
BUN: 13
Chloride: 108
Creatinine, Ser: 0.87
Glucose, Bld: 104 — ABNORMAL HIGH
Potassium: 3.4 — ABNORMAL LOW

## 2010-11-03 LAB — TYPE AND SCREEN: ABO/RH(D): O POS

## 2010-11-03 LAB — DIFFERENTIAL
Basophils Relative: 0
Eosinophils Relative: 0
Monocytes Absolute: 0.8 — ABNORMAL HIGH
Monocytes Relative: 5
Neutrophils Relative %: 78 — ABNORMAL HIGH

## 2010-11-03 LAB — POCT CARDIAC MARKERS
Myoglobin, poc: 68.3
Operator id: 284251

## 2010-11-03 LAB — APTT: aPTT: 32

## 2010-11-03 LAB — PROTIME-INR
INR: 1.1
Prothrombin Time: 14.6

## 2010-11-04 LAB — COMPREHENSIVE METABOLIC PANEL
ALT: 82 — ABNORMAL HIGH
AST: 134 — ABNORMAL HIGH
Albumin: 4.2
Calcium: 9.5
Chloride: 108
Creatinine, Ser: 1.1
GFR calc Af Amer: >60
Sodium: 143

## 2010-11-04 LAB — POCT CARDIAC MARKERS
CKMB, poc: <1 — ABNORMAL LOW
CKMB, poc: <1 — ABNORMAL LOW
CKMB, poc: <1 — ABNORMAL LOW
Myoglobin, poc: 58.7
Myoglobin, poc: 67.9
Myoglobin, poc: 81.7
Operator id: 133351
Operator id: 277751
Operator id: 4533
Operator id: 4533
Troponin i, poc: <0.05
Troponin i, poc: <0.05
Troponin i, poc: <0.05

## 2010-11-04 LAB — CBC
Hemoglobin: 13.5
MCHC: 32.9
MCHC: 33.1
MCV: 83.6
Platelets: 302
RDW: 20.6 — ABNORMAL HIGH
WBC: 7

## 2010-11-04 LAB — DIFFERENTIAL
Basophils Absolute: 0.1
Basophils Relative: 1
Eosinophils Relative: 0
Eosinophils Relative: 1
Lymphocytes Relative: 43
Lymphs Abs: 3
Monocytes Absolute: 0.7
Monocytes Absolute: 1 — ABNORMAL HIGH
Monocytes Relative: 9
Neutro Abs: 4.8

## 2010-11-04 LAB — ETHANOL: Alcohol, Ethyl (B): 179 — ABNORMAL HIGH

## 2010-11-04 LAB — URINALYSIS, ROUTINE W REFLEX MICROSCOPIC
Bilirubin Urine: NEGATIVE
Glucose, UA: NEGATIVE
Hgb urine dipstick: NEGATIVE
Nitrite: NEGATIVE
Specific Gravity, Urine: 1.021
pH: 6

## 2010-11-04 LAB — I-STAT 8, (EC8 V) (CONVERTED LAB)
BUN: 9
Chloride: 109
pCO2, Ven: 26.4 — ABNORMAL LOW
pH, Ven: 7.324 — ABNORMAL HIGH

## 2010-11-04 LAB — RAPID URINE DRUG SCREEN, HOSP PERFORMED
Barbiturates: POSITIVE — AB
Benzodiazepines: POSITIVE — AB
Cocaine: NOT DETECTED
Opiates: NOT DETECTED

## 2010-11-04 LAB — POCT I-STAT CREATININE: Creatinine, Ser: 1.2

## 2010-11-04 LAB — URINE MICROSCOPIC-ADD ON

## 2010-11-09 LAB — I-STAT 8, (EC8 V) (CONVERTED LAB)
Acid-base deficit: 5 — ABNORMAL HIGH
Acid-base deficit: 5 — ABNORMAL HIGH
Bicarbonate: 17 — ABNORMAL LOW
Bicarbonate: 18.9 — ABNORMAL LOW
Chloride: 113 — ABNORMAL HIGH
HCT: 44
HCT: 44
Operator id: 234501
Operator id: 234501
TCO2: 20
pCO2, Ven: 25.5 — ABNORMAL LOW
pCO2, Ven: 32.6 — ABNORMAL LOW

## 2010-11-09 LAB — COMPREHENSIVE METABOLIC PANEL
AST: 43 — ABNORMAL HIGH
Alkaline Phosphatase: 70
BUN: 11
CO2: 18 — ABNORMAL LOW
Chloride: 105
Creatinine, Ser: 1.22
GFR calc Af Amer: >60
GFR calc non Af Amer: >60
Total Bilirubin: 0.1 — ABNORMAL LOW

## 2010-11-09 LAB — DIFFERENTIAL
Basophils Absolute: 0.1
Basophils Relative: 1
Eosinophils Absolute: 0
Eosinophils Relative: 0
Eosinophils Relative: 0
Eosinophils Relative: 1
Lymphocytes Relative: 37
Lymphocytes Relative: 50 — ABNORMAL HIGH
Lymphs Abs: 3.6 — ABNORMAL HIGH
Monocytes Absolute: 0.5
Neutrophils Relative %: 55

## 2010-11-09 LAB — POCT CARDIAC MARKERS
CKMB, poc: 1.5
CKMB, poc: 1.5
CKMB, poc: 1.7
CKMB, poc: 2
CKMB, poc: 2.1
CKMB, poc: <1 — ABNORMAL LOW
Myoglobin, poc: 65.8
Myoglobin, poc: 71.6
Myoglobin, poc: 79.6
Myoglobin, poc: 96.1
Operator id: 277751
Operator id: 288831
Troponin i, poc: <0.05
Troponin i, poc: <0.05
Troponin i, poc: <0.05
Troponin i, poc: <0.05

## 2010-11-09 LAB — POCT I-STAT CREATININE: Operator id: 234501

## 2010-11-09 LAB — URINALYSIS, ROUTINE W REFLEX MICROSCOPIC
Glucose, UA: NEGATIVE
Glucose, UA: NEGATIVE
Hgb urine dipstick: NEGATIVE
Hgb urine dipstick: NEGATIVE
Ketones, ur: 40 — AB
Protein, ur: NEGATIVE
Urobilinogen, UA: 0.2
pH: 5.5

## 2010-11-09 LAB — SALICYLATE LEVEL: Salicylate Lvl: 34.8

## 2010-11-09 LAB — CBC
HCT: 38.5 — ABNORMAL LOW
HCT: 39.6
Hemoglobin: 13
MCHC: 32.7
MCV: 81
MCV: 82.6
Platelets: 292
RBC: 4.75
WBC: 7.3
WBC: 9
WBC: 9

## 2010-11-09 LAB — RAPID URINE DRUG SCREEN, HOSP PERFORMED
Amphetamines: NOT DETECTED
Amphetamines: NOT DETECTED
Barbiturates: NOT DETECTED
Benzodiazepines: POSITIVE — AB
Benzodiazepines: POSITIVE — AB
Cocaine: NOT DETECTED
Opiates: NOT DETECTED
Tetrahydrocannabinol: NOT DETECTED

## 2010-11-09 LAB — ETHANOL: Alcohol, Ethyl (B): 261 — ABNORMAL HIGH

## 2010-11-09 LAB — D-DIMER, QUANTITATIVE: D-Dimer, Quant: 0.85 — ABNORMAL HIGH

## 2010-11-09 LAB — BASIC METABOLIC PANEL
BUN: 15
CO2: 18 — ABNORMAL LOW
Calcium: 9.2
Creatinine, Ser: 1.41
Glucose, Bld: 74

## 2010-11-10 LAB — RAPID URINE DRUG SCREEN, HOSP PERFORMED
Amphetamines: NOT DETECTED
Barbiturates: NOT DETECTED
Tetrahydrocannabinol: NOT DETECTED

## 2010-11-10 LAB — CBC
MCHC: 31.9
MCHC: 32.9
MCV: 82.7
Platelets: 304
RDW: 17.4 — ABNORMAL HIGH
RDW: 17.9 — ABNORMAL HIGH

## 2010-11-10 LAB — COMPREHENSIVE METABOLIC PANEL
AST: 37
CO2: 19
Calcium: 9.8
Creatinine, Ser: 0.94
GFR calc Af Amer: >60
GFR calc non Af Amer: >60

## 2010-11-10 LAB — DIFFERENTIAL
Basophils Absolute: 0
Basophils Relative: 1
Eosinophils Absolute: 0.4
Eosinophils Relative: 0
Lymphocytes Relative: 39
Lymphs Abs: 4.5 — ABNORMAL HIGH
Neutro Abs: 3.9
Neutrophils Relative %: 47

## 2010-11-10 LAB — POCT CARDIAC MARKERS
CKMB, poc: 2.7
Myoglobin, poc: 115
Myoglobin, poc: 163
Operator id: 277751
Operator id: 277751

## 2010-11-10 LAB — HEPATIC FUNCTION PANEL
ALT: 13
Alkaline Phosphatase: 65
Bilirubin, Direct: 0.1
Indirect Bilirubin: 0.5
Total Bilirubin: 0.6

## 2010-11-10 LAB — TRICYCLICS SCREEN, URINE: TCA Scrn: NOT DETECTED

## 2010-11-10 LAB — PROTIME-INR
INR: 1
Prothrombin Time: 13.6

## 2011-02-21 ENCOUNTER — Encounter (HOSPITAL_COMMUNITY): Payer: Self-pay | Admitting: *Deleted

## 2011-02-21 ENCOUNTER — Emergency Department (HOSPITAL_COMMUNITY)
Admission: EM | Admit: 2011-02-21 | Discharge: 2011-02-21 | Discharge status: Against Medical Advice | Payer: Self-pay | Attending: Emergency Medicine | Admitting: Emergency Medicine

## 2011-02-21 DIAGNOSIS — R109 Unspecified abdominal pain: Secondary | ICD-10-CM | POA: Insufficient documentation

## 2011-02-21 DIAGNOSIS — R112 Nausea with vomiting, unspecified: Secondary | ICD-10-CM | POA: Insufficient documentation

## 2011-02-21 DIAGNOSIS — R197 Diarrhea, unspecified: Secondary | ICD-10-CM | POA: Insufficient documentation

## 2011-02-21 NOTE — ED Notes (Signed)
Pt reports generalized abdominal cramping since Sunday. Reports two episodes of diarrhea with dark stool this am. Reports nausea/vomiting. Denies urinary symptoms.

## 2011-02-21 NOTE — ED Notes (Signed)
Pt states "I had the flu, thought I was over it but for the past 2 days have been having abd cramping, have taken pepto-bismol, loose stool x 2"

## 2011-02-21 NOTE — ED Notes (Signed)
Pt walked out of room, stating he has to leave. Attempted to talk with pt into staying and to let MD see pt, but pt refused. Pt refused to sign AMA form. Charge nurse made aware.

## 2011-03-15 ENCOUNTER — Other Ambulatory Visit: Payer: Self-pay | Admitting: Nurse Practitioner

## 2011-04-13 ENCOUNTER — Other Ambulatory Visit: Payer: Self-pay | Admitting: Nurse Practitioner

## 2011-06-21 ENCOUNTER — Encounter: Payer: Self-pay | Admitting: Gastroenterology

## 2011-07-14 ENCOUNTER — Ambulatory Visit: Payer: Medicare Other | Admitting: Gastroenterology

## 2011-07-25 ENCOUNTER — Ambulatory Visit: Payer: Medicare Other | Admitting: Gastroenterology

## 2011-07-31 ENCOUNTER — Emergency Department (HOSPITAL_COMMUNITY): Payer: Medicare Other

## 2011-07-31 ENCOUNTER — Emergency Department (HOSPITAL_COMMUNITY)
Admission: EM | Admit: 2011-07-31 | Discharge: 2011-08-01 | Disposition: A | Discharge status: Home / Self Care | Payer: Medicare Other | Attending: Emergency Medicine | Admitting: Emergency Medicine

## 2011-07-31 ENCOUNTER — Encounter (HOSPITAL_COMMUNITY): Payer: Self-pay | Admitting: *Deleted

## 2011-07-31 DIAGNOSIS — S0180XA Unspecified open wound of other part of head, initial encounter: Secondary | ICD-10-CM | POA: Insufficient documentation

## 2011-07-31 DIAGNOSIS — M542 Cervicalgia: Secondary | ICD-10-CM | POA: Insufficient documentation

## 2011-07-31 DIAGNOSIS — F10929 Alcohol use, unspecified with intoxication, unspecified: Secondary | ICD-10-CM

## 2011-07-31 DIAGNOSIS — W19XXXA Unspecified fall, initial encounter: Secondary | ICD-10-CM

## 2011-07-31 DIAGNOSIS — F1010 Alcohol abuse, uncomplicated: Secondary | ICD-10-CM | POA: Insufficient documentation

## 2011-07-31 DIAGNOSIS — S0181XA Laceration without foreign body of other part of head, initial encounter: Secondary | ICD-10-CM

## 2011-07-31 DIAGNOSIS — Z7982 Long term (current) use of aspirin: Secondary | ICD-10-CM | POA: Insufficient documentation

## 2011-07-31 DIAGNOSIS — S0990XA Unspecified injury of head, initial encounter: Secondary | ICD-10-CM

## 2011-07-31 DIAGNOSIS — I10 Essential (primary) hypertension: Secondary | ICD-10-CM | POA: Insufficient documentation

## 2011-07-31 DIAGNOSIS — E785 Hyperlipidemia, unspecified: Secondary | ICD-10-CM | POA: Insufficient documentation

## 2011-07-31 DIAGNOSIS — F411 Generalized anxiety disorder: Secondary | ICD-10-CM | POA: Insufficient documentation

## 2011-07-31 DIAGNOSIS — Z79899 Other long term (current) drug therapy: Secondary | ICD-10-CM | POA: Insufficient documentation

## 2011-07-31 DIAGNOSIS — I2510 Atherosclerotic heart disease of native coronary artery without angina pectoris: Secondary | ICD-10-CM | POA: Insufficient documentation

## 2011-07-31 DIAGNOSIS — F17200 Nicotine dependence, unspecified, uncomplicated: Secondary | ICD-10-CM | POA: Insufficient documentation

## 2011-07-31 DIAGNOSIS — W010XXA Fall on same level from slipping, tripping and stumbling without subsequent striking against object, initial encounter: Secondary | ICD-10-CM | POA: Insufficient documentation

## 2011-07-31 NOTE — ED Notes (Signed)
LKG:MW10<UV> Expected date:<BR> Expected time:<BR> Means of arrival:<BR> Comments:<BR> EMS/fall/struck head/LSB-heavy ETOH-unknown LOC

## 2011-07-31 NOTE — ED Notes (Signed)
Per EMS: patient from home with c/o fall and ETOH. Patient fell from standing and hit forehead, laceration to left eye. Pt alert, does not know events leading up to his fall.

## 2011-08-01 LAB — CBC
Hemoglobin: 13.4 g/dL (ref 13.0–17.0)
MCHC: 32.4 g/dL (ref 30.0–36.0)
Platelets: 311 10*3/uL (ref 150–400)
RBC: 4.96 MIL/uL (ref 4.22–5.81)

## 2011-08-01 LAB — BASIC METABOLIC PANEL
BUN: 9 mg/dL (ref 6–23)
CO2: 14 mEq/L — ABNORMAL LOW (ref 19–32)
Calcium: 9 mg/dL (ref 8.4–10.5)
Chloride: 96 mEq/L (ref 96–112)
Creatinine, Ser: 0.79 mg/dL (ref 0.50–1.35)
GFR calc Af Amer: >90 mL/min (ref >90)
GFR calc non Af Amer: >90 mL/min (ref >90)
Glucose, Bld: 79 mg/dL (ref 70–99)
Potassium: 3.9 mEq/L (ref 3.5–5.1)
Sodium: 128 mEq/L — ABNORMAL LOW (ref 135–145)

## 2011-08-01 LAB — ETHANOL: Alcohol, Ethyl (B): 217 mg/dL — ABNORMAL HIGH (ref 0–11)

## 2011-08-01 NOTE — Discharge Instructions (Signed)
Alcohol Intoxication You have alcohol intoxication when the amount of alcohol that you have consumed has impaired your ability to mentally and physically function. There are a variety of factors that contribute to the level at which alcohol intoxication can occur, such as age, gender, weight, frequency of alcohol consumption, medication use, and the presence of other medical conditions, such as diabetes, seizures, or heart conditions. The blood alcohol level test measures the concentration of alcohol in your blood. In most states, your blood alcohol level must be lower than 80 mg/dL (1.61%) to legally drive. However, many dangerous effects of alcohol can occur at much lower levels. Alcohol directly impairs the normal chemical activity of the brain and is said to be a chemical depressant. Alcohol can cause drowsiness, stupor, respiratory failure, and coma. Other physical effects can include headache, vomiting, vomiting of blood, abdominal pain, a fast heartbeat, difficulty breathing, anxiety, and amnesia. Alcohol intoxication can also lead to dangerous and life-threatening activities, such as fighting, dangerous operation of vehicles or heavy machinery, and risky sexual behavior. Alcohol can be especially dangerous when taken with other drugs. Some of these drugs are:  Sedatives.   Painkillers.   Marijuana.   Tranquilizers.   Antihistamines.   Muscle relaxants.   Seizure medicine.  Many of the effects of acute alcohol intoxication are temporary. However, repeated alcohol intoxication can lead to severe medical illnesses. If you have alcohol intoxication, you should:  Stay hydrated. Drink enough water and fluids to keep your urine clear or pale yellow. Avoid excessive caffeine because this can further lead to dehydration.   Eat a healthy diet. You may have residual nausea, headache, and loss of appetite, but it is still important that you maintain good nutrition. You can start with clear  liquids.   Take nonsteroidal anti-inflammatory medications as needed for headaches, but make sure to do so with small meals. You should avoid acetaminophen for several days after having alcohol intoxication because the combination of alcohol and acetaminophen can be toxic to your liver.  If you have frequent alcohol intoxication, ask your friends and family if they think you have a drinking problem. For further help, contact:  Your caregiver.   Alcoholics Anonymous (AA).   A drug or alcohol rehabilitation program.  SEEK MEDICAL CARE IF:   You have persistent vomiting.   You have persistent pain in any part of your body.   You do not feel better after a few days.  SEEK IMMEDIATE MEDICAL CARE IF:   You become shaky or tremble when you try to stop drinking.   You shake uncontrollably (seizure).   You throw up (vomit) blood. This may be bright red or it may look like black coffee grounds.   You have blood in the stool. This may be bright red or appear as a black, tarry, bad smelling stool.   You become lightheaded or faint.  ANY OF THESE SYMPTOMS MAY REPRESENT A SERIOUS PROBLEM THAT IS AN EMERGENCY. Do not wait to see if the symptoms will go away. Get medical help right away. Call your local emergency services (911 in [place] DO NOT drive yourself to the hospital. MAKE SURE YOU:   Understand these instructions.   Will watch your condition.   Will get help right away if you are not doing well or get worse.  Document Released: 10/19/2004 Document Revised: 12/29/2010 Document Reviewed: 06/28/2009 [facility] Patient Information 2012 [place], [place].Head Injury, Adult You have had a head injury that does not appear serious at this  time. A concussion is a state of changed mental ability, usually from a blow to the head. You should take clear liquids for the rest of the day and then resume your regular diet. You should not take sedatives or alcoholic beverages for as long as directed by your  caregiver after discharge. After injuries such as yours, most problems occur within the first 24 hours. SYMPTOMS These minor symptoms may be experienced after discharge:  Memory difficulties.   Dizziness.   Headaches.   Double vision.   Hearing difficulties.   Depression.   Tiredness.   Weakness.   Difficulty with concentration.  If you experience any of these problems, you should not be alarmed. A concussion requires a few days for recovery. Many patients with head injuries frequently experience such symptoms. Usually, these problems disappear without medical care. If symptoms last for more than one day, notify your caregiver. See your caregiver sooner if symptoms are becoming worse rather than better. HOME CARE INSTRUCTIONS   During the next 24 hours you must stay with someone who can watch you for the warning signs listed below.  Although it is unlikely that serious side effects will occur, you should be aware of signs and symptoms which may necessitate your return to this location. Side effects may occur up to 7 - 10 days following the injury. It is important for you to carefully monitor your condition and contact your caregiver or seek immediate medical attention if there is a change in your condition. SEEK IMMEDIATE MEDICAL CARE IF:   There is confusion or drowsiness.   You can not awaken the injured person.   There is nausea (feeling sick to your stomach) or continued, forceful vomiting.   You notice dizziness or unsteadiness which is getting worse, or inability to walk.   You have convulsions or unconsciousness.   You experience severe, persistent headaches not relieved by over-the-counter or prescription medicines for pain. (Do not take aspirin as this impairs clotting abilities). Take other pain medications only as directed.   You can not use arms or legs normally.   There is clear or bloody discharge from the nose or ears.  MAKE SURE YOU:   Understand these  instructions.   Will watch your condition.   Will get help right away if you are not doing well or get worse.  Document Released: 01/09/2005 Document Revised: 12/29/2010 Document Reviewed: 11/27/2008 [facility] Patient Information 2012 [place], [place].Facial Laceration A facial laceration is a cut on the face. Lacerations usually heal quickly, but they need special care to reduce scarring. It will take 1 to 2 years for the scar to lose its redness and to heal completely. TREATMENT  Some facial lacerations may not require closure. Some lacerations may not be able to be closed due to an increased risk of infection. It is important to see your caregiver as soon as possible after an injury to minimize the risk of infection and to maximize the opportunity for successful closure. If closure is appropriate, pain medicines may be given, if needed. The wound will be cleaned to help prevent infection. Your caregiver will use stitches (sutures), staples, wound glue (adhesive), or skin adhesive strips to repair the laceration. These tools bring the skin edges together to allow for faster healing and a better cosmetic outcome. However, all wounds will heal with a scar.  Once the wound has healed, scarring can be minimized by covering the wound with sunscreen during the day for 1 full year. Use a sunscreen  with an SPF of at least 30. Sunscreen helps to reduce the pigment that will form in the scar. When applying sunscreen to a completely healed wound, massage the scar for a few minutes to help reduce the appearance of the scar. Use circular motions with your fingertips, on and around the scar. Do not massage a healing wound. HOME CARE INSTRUCTIONS For sutures:  Keep the wound clean and dry.   If you were given a bandage (dressing), you should change it at least once a day. Also change the dressing if it becomes wet or dirty, or as directed by your caregiver.   Wash the wound with soap and water 2 times a day.  Rinse the wound off with water to remove all soap. Pat the wound dry with a clean towel.   After cleaning, apply a thin layer of the antibiotic ointment recommended by your caregiver. This will help prevent infection and keep the dressing from sticking.   You may shower as usual after the first 24 hours. Do not soak the wound in water until the sutures are removed.   Only take over-the-counter or prescription medicines for pain, discomfort, or fever as directed by your caregiver.   Get your sutures removed as directed by your caregiver. With facial lacerations, sutures should usually be taken out after 4 to 5 days to avoid stitch marks.   Wait a few days after your sutures are removed before applying makeup.  For skin adhesive strips:  Keep the wound clean and dry.   Do not get the skin adhesive strips wet. You may bathe carefully, using caution to keep the wound dry.   If the wound gets wet, pat it dry with a clean towel.   Skin adhesive strips will fall off on their own. You may trim the strips as the wound heals. Do not remove skin adhesive strips that are still stuck to the wound. They will fall off in time.  For wound adhesive:  You may briefly wet your wound in the shower or bath. Do not soak or scrub the wound. Do not swim. Avoid periods of heavy perspiration until the skin adhesive has fallen off on its own. After showering or bathing, gently pat the wound dry with a clean towel.   Do not apply liquid medicine, cream medicine, ointment medicine, or makeup to your wound while the skin adhesive is in place. This may loosen the film before your wound is healed.   If a dressing is placed over the wound, be careful not to apply tape directly over the skin adhesive. This may cause the adhesive to be pulled off before the wound is healed.   Avoid prolonged exposure to sunlight or tanning lamps while the skin adhesive is in place. Exposure to ultraviolet light in the first year will  darken the scar.   The skin adhesive will usually remain in place for 5 to 10 days, then naturally fall off the skin. Do not pick at the adhesive film.  You may need a tetanus shot if:  You cannot remember when you had your last tetanus shot.   You have never had a tetanus shot.  If you get a tetanus shot, your arm may swell, get red, and feel warm to the touch. This is common and not a problem. If you need a tetanus shot and you choose not to have one, there is a rare chance of getting tetanus. Sickness from tetanus can be serious. SEEK IMMEDIATE MEDICAL CARE  IF:  You develop redness, pain, or swelling around the wound.   There is yellowish-white fluid (pus) coming from the wound.   You develop chills or a fever.  MAKE SURE YOU:  Understand these instructions.   Will watch your condition.   Will get help right away if you are not doing well or get worse.  Document Released: 02/17/2004 Document Revised: 12/29/2010 Document Reviewed: 07/04/2010 [facility] Patient Information 2012 [place], [place].

## 2011-08-01 NOTE — ED Notes (Signed)
Patient given discharge instructions, information, prescriptions, and diet order. Patient states that they adequately understand discharge information given and to return to ED if symptoms return or worsen.    Pt sts he is going to call cab to get home tonight. This RN informed him that he will not be able to leave without ride home.

## 2011-08-01 NOTE — ED Provider Notes (Addendum)
History     CSN: [id number]  Arrival date & time 07/31/11  2241   First MD Initiated Contact with Patient 07/31/11 2311      Chief Complaint  Patient presents with  . Alcohol Intoxication  . Fall    Laceration above left eye    HPI The patient reports that he developed a laceration of his head after falling tonight.  He reports his been drinking a significant amount.  He fell from a standing position in his forehead.  He denies headache.  He does a room her a lot of the events leading up to the fall.  He denies neck pain.  He denies weakness in his upper lower extremities.  Denies chest pain shortness breath.  His abdominal pain.  He has no other complaints at this time.  His tetanus is up-to-date.  His symptoms are mild to moderate in severity.  There is no active bleeding at this time.   Past Medical History  Diagnosis Date  . Chest pain   . MVP (mitral valve prolapse)   . Anxiety   . Hypertension   . Coronary artery disease     s/p PCI to RCA in 2007  . Hyperlipidemia   . Palpitations   . Bipolar disorder   . Depression   . Esophagitis   . Gastritis   . Insomnia     Past Surgical History  Procedure Date  . Coronary angioplasty 2007    RCA  . Cardiac catheterization 04/2008    EF 65%    Family History  Problem Relation Age of Onset  . Heart disease Neg Hx   . Prostate cancer Father     History  Substance Use Topics  . Smoking status: Current Some Day Smoker  . Smokeless tobacco: Not on file  . Alcohol Use: Yes      Review of Systems  All other systems reviewed and are negative.    Allergies  Ambien and Codeine  Home Medications   Current Outpatient Rx  Name Route Sig Dispense Refill  . ASPIRIN 325 MG PO TABS Oral Take 325 mg by mouth daily.      . ATORVASTATIN CALCIUM 40 MG PO TABS Oral Take 1 tablet (40 mg total) by mouth daily. 30 tablet 11  . BUPROPION HCL ER (XL) 300 MG PO TB24 Oral Take 300 mg by mouth daily.     . IBUPROFEN 200 MG PO  TABS Oral Take 200 mg by mouth every 6 (six) hours as needed.      Marland Kitchen METOPROLOL SUCCINATE ER 25 MG PO TB24  TAKE ONE TABLET BY MOUTH DAILY 30 tablet 6  . NAPHAZOLINE HCL 0.012 % OP SOLN Both Eyes Place 1 drop into both eyes 3 (three) times daily as needed. For dry eyes    . PROMETHAZINE HCL 25 MG PO TABS Oral Take 25 mg by mouth every 6 (six) hours as needed.    Marland Kitchen QUETIAPINE FUMARATE ER 50 MG PO TB24 Oral Take 50 mg by mouth at bedtime.    Marland Kitchen ZALEPLON 10 MG PO CAPS Oral Take 10 mg by mouth at bedtime as needed. For sleep    . NITROGLYCERIN 0.4 MG SL SUBL Sublingual Place 0.4 mg under the tongue every 5 (five) minutes as needed.        BP 147/93  Pulse 76  Temp 98.6 F (37 C) (Oral)  Resp 16  SpO2 95%  Physical Exam  Nursing note and vitals reviewed. Constitutional: He  is oriented to person, place, and time. He appears well-developed and well-nourished.  HENT:  Head: Normocephalic and atraumatic.       2 cm laceration of his left anterior forehead.  No active bleeding at this time.  No trismus or malocclusion.  No dental injury.  Eyes: EOM are normal.  Neck:       Immobilized in cervical collar. mild C-spine and paracervical tenderness without step-off  Cardiovascular: Normal rate, regular rhythm, normal heart sounds and intact distal pulses.   Pulmonary/Chest: Effort normal and breath sounds normal. No respiratory distress.  Abdominal: Soft. He exhibits no distension. There is no tenderness.  Musculoskeletal: Normal range of motion.  Neurological: He is alert and oriented to person, place, and time.  Skin: Skin is warm and dry.  Psychiatric: He has a normal mood and affect. Judgment normal.    ED Course  Procedures (including critical care time)  LACERATION REPAIR Performed by: Lyanne Co Consent: Verbal consent obtained. Risks and benefits: risks, benefits and alternatives were discussed Patient identity confirmed: provided demographic data Time out performed prior to  procedure Prepped and Draped in normal sterile fashion Wound explored Laceration Location:  Left anterior forehead Laceration Length: 2 cm No Foreign Bodies seen or palpated Anesthesia: local infiltration Local anesthetic: lidocaine 2 % with epinephrine Anesthetic total: 4 ml Irrigation method: syringe Amount of cleaning: standard Skin closure: 5-0 Prolene  Number of sutures or staples: 5  Technique: Running interlocked  Patient tolerance: Patient tolerated the procedure well with no immediate complications.   Labs Reviewed  CBC - Abnormal; Notable for the following:    WBC 14.2 (*)     RDW 16.6 (*)     All other components within normal limits  ETHANOL - Abnormal; Notable for the following:    Alcohol, Ethyl (B) 217 (*)     All other components within normal limits  BASIC METABOLIC PANEL - Abnormal; Notable for the following:    Sodium 128 (*)     CO2 14 (*)     All other components within normal limits   Ct Head Wo Contrast  08/01/2011  *RADIOLOGY REPORT*  Clinical Data:  Status post fall from standing; hit forehead, with laceration about the left orbit.  Concern for cervical spine injury.  CT HEAD WITHOUT CONTRAST AND CT CERVICAL SPINE WITHOUT CONTRAST  Technique:  Multidetector CT imaging of the head and cervical spine was performed following the standard protocol without intravenous contrast.  Multiplanar CT image reconstructions of the cervical spine were also generated.  Comparison: CT of the head performed 08/16/2009, and CT of the cervical spine performed 01/01/2009  CT HEAD  Findings: There is no evidence of acute infarction, mass lesion, or intra- or extra-axial hemorrhage on CT.  Prominence of the ventricles and sulci reflects mild cortical volume loss.  Mild scattered periventricular and subcortical white matter change likely reflects small vessel ischemic microangiopathy.  Mild cerebellar atrophy is noted.  The brainstem and fourth ventricle are within normal limits.  The  basal ganglia are unremarkable in appearance.  The cerebral hemispheres demonstrate grossly normal gray-white differentiation. No mass effect or midline shift is seen.  There is no evidence of fracture; visualized osseous structures are unremarkable in appearance.  The orbits are within normal limits. There is complete opacification of the right maxillary sinus, with associated mucoperiosteal thickening, and mild partial opacification of the right frontal sinus; the remaining paranasal sinuses and mastoid air cells are well-aerated.  A soft tissue laceration and soft  tissue swelling noted overlying the left frontal calvarium, and a focal scalp hematoma is noted overlying the right posterior parietal calvarium.  IMPRESSION:  1.  No evidence of traumatic intracranial injury or fracture. 2.  Soft tissue laceration and soft tissue swelling overlying the left frontal calvarium, and focal soft scalp hematoma overlying the right posterior parietal calvarium. 3.  Mild cortical volume loss and scattered small vessel ischemic microangiopathy. 4.  Complete opacification of the right maxillary sinus, with mucoperiosteal thickening; mild partial opacification of the right frontal sinus.  CT CERVICAL SPINE  Findings: There is no evidence of acute fracture or subluxation. There is mild grade 1 anterolisthesis of C4 on C5, with narrowing of the intervertebral disc spaces at C4-C5, C5-C6 and C6-C7, with associated sclerotic changes, and anterior and posterior disc osteophyte complexes.  Intervertebral disc spaces are preserved. Prevertebral soft tissues are within normal limits.  The visualized portions of the thyroid gland are unremarkable in appearance.  Mild scattered calcification is noted at the carotid bifurcations bilaterally.  IMPRESSION:  1.  No evidence of acute fracture or subluxation along the cervical spine. 2.  Mild degenerative change noted along the cervical spine. 3.  Mild scattered calcification at the carotid  bifurcations bilaterally.  Original Report Authenticated By: Tonia Ghent, M.D.   Ct Cervical Spine Wo Contrast  08/01/2011  *RADIOLOGY REPORT*  Clinical Data:  Status post fall from standing; hit forehead, with laceration about the left orbit.  Concern for cervical spine injury.  CT HEAD WITHOUT CONTRAST AND CT CERVICAL SPINE WITHOUT CONTRAST  Technique:  Multidetector CT imaging of the head and cervical spine was performed following the standard protocol without intravenous contrast.  Multiplanar CT image reconstructions of the cervical spine were also generated.  Comparison: CT of the head performed 08/16/2009, and CT of the cervical spine performed 01/01/2009  CT HEAD  Findings: There is no evidence of acute infarction, mass lesion, or intra- or extra-axial hemorrhage on CT.  Prominence of the ventricles and sulci reflects mild cortical volume loss.  Mild scattered periventricular and subcortical white matter change likely reflects small vessel ischemic microangiopathy.  Mild cerebellar atrophy is noted.  The brainstem and fourth ventricle are within normal limits.  The basal ganglia are unremarkable in appearance.  The cerebral hemispheres demonstrate grossly normal gray-white differentiation. No mass effect or midline shift is seen.  There is no evidence of fracture; visualized osseous structures are unremarkable in appearance.  The orbits are within normal limits. There is complete opacification of the right maxillary sinus, with associated mucoperiosteal thickening, and mild partial opacification of the right frontal sinus; the remaining paranasal sinuses and mastoid air cells are well-aerated.  A soft tissue laceration and soft tissue swelling noted overlying the left frontal calvarium, and a focal scalp hematoma is noted overlying the right posterior parietal calvarium.  IMPRESSION:  1.  No evidence of traumatic intracranial injury or fracture. 2.  Soft tissue laceration and soft tissue swelling  overlying the left frontal calvarium, and focal soft scalp hematoma overlying the right posterior parietal calvarium. 3.  Mild cortical volume loss and scattered small vessel ischemic microangiopathy. 4.  Complete opacification of the right maxillary sinus, with mucoperiosteal thickening; mild partial opacification of the right frontal sinus.  CT CERVICAL SPINE  Findings: There is no evidence of acute fracture or subluxation. There is mild grade 1 anterolisthesis of C4 on C5, with narrowing of the intervertebral disc spaces at C4-C5, C5-C6 and C6-C7, with associated sclerotic changes, and anterior and  posterior disc osteophyte complexes.  Intervertebral disc spaces are preserved. Prevertebral soft tissues are within normal limits.  The visualized portions of the thyroid gland are unremarkable in appearance.  Mild scattered calcification is noted at the carotid bifurcations bilaterally.  IMPRESSION:  1.  No evidence of acute fracture or subluxation along the cervical spine. 2.  Mild degenerative change noted along the cervical spine. 3.  Mild scattered calcification at the carotid bifurcations bilaterally.  Original Report Authenticated By: Tonia Ghent, M.D.    I personally reviewed the imaging tests through PACS system  I reviewed available ER/hospitalization records thought the EMR   1. Head injury   2. Laceration of forehead   3. Fall   4. Alcohol intoxication       MDM  The patient's laceration was repaired.  Head injury and infection warnings given.  Head CT without traumatic pathology.   Lyanne Co, MD 08/01/11 0240   Date: 08/01/2011  Rate: 79  Rhythm: normal sinus rhythm  QRS Axis: normal  Intervals: normal  ST/T Wave abnormalities: normal  Conduction Disutrbances: none  Narrative Interpretation:   Old EKG Reviewed: No significant changes noted     Lyanne Co, MD 08/01/11 [phone]

## 2011-08-01 NOTE — ED Notes (Signed)
Phlebotomy at bedside.

## 2011-08-15 ENCOUNTER — Ambulatory Visit: Payer: Medicare Other | Admitting: Gastroenterology

## 2011-08-31 ENCOUNTER — Encounter (HOSPITAL_COMMUNITY): Payer: Self-pay | Admitting: Vascular Surgery

## 2011-08-31 ENCOUNTER — Emergency Department (HOSPITAL_COMMUNITY)
Admission: EM | Admit: 2011-08-31 | Discharge: 2011-09-01 | Disposition: A | Discharge status: Other Acute Inpatient Hospital | Payer: Medicare Other | Source: Home / Self Care | Attending: Emergency Medicine | Admitting: Emergency Medicine

## 2011-08-31 DIAGNOSIS — F1010 Alcohol abuse, uncomplicated: Secondary | ICD-10-CM | POA: Insufficient documentation

## 2011-08-31 DIAGNOSIS — Z79899 Other long term (current) drug therapy: Secondary | ICD-10-CM | POA: Insufficient documentation

## 2011-08-31 DIAGNOSIS — I10 Essential (primary) hypertension: Secondary | ICD-10-CM | POA: Insufficient documentation

## 2011-08-31 DIAGNOSIS — F1310 Sedative, hypnotic or anxiolytic abuse, uncomplicated: Secondary | ICD-10-CM

## 2011-08-31 DIAGNOSIS — F3289 Other specified depressive episodes: Secondary | ICD-10-CM | POA: Insufficient documentation

## 2011-08-31 DIAGNOSIS — Z96659 Presence of unspecified artificial knee joint: Secondary | ICD-10-CM | POA: Insufficient documentation

## 2011-08-31 DIAGNOSIS — Z4802 Encounter for removal of sutures: Secondary | ICD-10-CM | POA: Insufficient documentation

## 2011-08-31 DIAGNOSIS — Z7982 Long term (current) use of aspirin: Secondary | ICD-10-CM | POA: Insufficient documentation

## 2011-08-31 DIAGNOSIS — F411 Generalized anxiety disorder: Secondary | ICD-10-CM | POA: Insufficient documentation

## 2011-08-31 DIAGNOSIS — F329 Major depressive disorder, single episode, unspecified: Secondary | ICD-10-CM | POA: Insufficient documentation

## 2011-08-31 DIAGNOSIS — I2510 Atherosclerotic heart disease of native coronary artery without angina pectoris: Secondary | ICD-10-CM | POA: Insufficient documentation

## 2011-08-31 DIAGNOSIS — E785 Hyperlipidemia, unspecified: Secondary | ICD-10-CM | POA: Insufficient documentation

## 2011-08-31 LAB — COMPREHENSIVE METABOLIC PANEL
ALT: 13 U/L (ref 0–53)
Albumin: 4.2 g/dL (ref 3.5–5.2)
Alkaline Phosphatase: 82 U/L (ref 39–117)
BUN: 9 mg/dL (ref 6–23)
Chloride: 100 mEq/L (ref 96–112)
GFR calc Af Amer: >90 mL/min (ref >90)
Glucose, Bld: 96 mg/dL (ref 70–99)
Potassium: 3.8 mEq/L (ref 3.5–5.1)
Sodium: 134 mEq/L — ABNORMAL LOW (ref 135–145)
Total Bilirubin: 0.3 mg/dL (ref 0.3–1.2)

## 2011-08-31 LAB — ETHANOL: Alcohol, Ethyl (B): <11 mg/dL (ref 0–11)

## 2011-08-31 LAB — CBC WITH DIFFERENTIAL/PLATELET
Hemoglobin: 12.4 g/dL — ABNORMAL LOW (ref 13.0–17.0)
Lymphs Abs: 1.7 10*3/uL (ref 0.7–4.0)
Monocytes Relative: 11 % (ref 3–12)
Neutro Abs: 1.9 10*3/uL (ref 1.7–7.7)
Neutrophils Relative %: 42 % — ABNORMAL LOW (ref 43–77)
RBC: 4.54 MIL/uL (ref 4.22–5.81)
WBC: 4.5 10*3/uL (ref 4.0–10.5)

## 2011-08-31 LAB — RAPID URINE DRUG SCREEN, HOSP PERFORMED
Barbiturates: NOT DETECTED
Tetrahydrocannabinol: NOT DETECTED

## 2011-08-31 MED ORDER — LORAZEPAM 1 MG PO TABS: 0.0000 mg | ORAL_TABLET | Freq: Two times a day (BID) | ORAL | Status: DC

## 2011-08-31 MED ORDER — IBUPROFEN 200 MG PO TABS
400.0000 mg | ORAL_TABLET | Freq: Once | ORAL | Status: AC
  Administered 2011-08-31: 400 mg via ORAL
  Filled 2011-08-31: qty 2

## 2011-08-31 MED ORDER — VITAMIN B-1 100 MG PO TABS
100.0000 mg | ORAL_TABLET | Freq: Every day | ORAL | Status: DC
  Administered 2011-08-31 – 2011-09-01 (×2): 100 mg via ORAL
  Filled 2011-08-31 (×2): qty 1

## 2011-08-31 MED ORDER — IBUPROFEN 800 MG PO TABS: 800.0000 mg | ORAL_TABLET | Freq: Once | ORAL | Status: DC

## 2011-08-31 MED ORDER — LORAZEPAM 1 MG PO TABS: 1.0000 mg | ORAL_TABLET | Freq: Once | ORAL | Status: DC

## 2011-08-31 MED ORDER — LORAZEPAM 1 MG PO TABS
0.0000 mg | ORAL_TABLET | Freq: Four times a day (QID) | ORAL | Status: DC
  Administered 2011-08-31 (×2): 1 mg via ORAL
  Filled 2011-08-31 (×2): qty 1

## 2011-08-31 MED ORDER — HYDRALAZINE HCL 20 MG/ML IJ SOLN
20.0000 mg | Freq: Once | INTRAMUSCULAR | Status: DC
  Filled 2011-08-31: qty 1

## 2011-08-31 MED ORDER — METOPROLOL TARTRATE 25 MG PO TABS
25.0000 mg | ORAL_TABLET | Freq: Once | ORAL | Status: AC
  Administered 2011-08-31: 25 mg via ORAL
  Filled 2011-08-31: qty 1

## 2011-08-31 MED ORDER — LORAZEPAM 2 MG/ML IJ SOLN: 1.0000 mg | Freq: Four times a day (QID) | INTRAMUSCULAR | Status: DC | PRN

## 2011-08-31 MED ORDER — FOLIC ACID 1 MG PO TABS
1.0000 mg | ORAL_TABLET | Freq: Every day | ORAL | Status: DC
  Administered 2011-08-31 – 2011-09-01 (×2): 1 mg via ORAL
  Filled 2011-08-31 (×2): qty 1

## 2011-08-31 MED ORDER — LORAZEPAM 1 MG PO TABS
1.0000 mg | ORAL_TABLET | Freq: Four times a day (QID) | ORAL | Status: DC | PRN
  Administered 2011-08-31 – 2011-09-01 (×2): 1 mg via ORAL
  Filled 2011-08-31 (×2): qty 1

## 2011-08-31 MED ORDER — HYDRALAZINE HCL 20 MG/ML IJ SOLN
10.0000 mg | Freq: Once | INTRAMUSCULAR | Status: DC
  Filled 2011-08-31: qty 0.5

## 2011-08-31 MED ORDER — HYDRALAZINE HCL 25 MG PO TABS
25.0000 mg | ORAL_TABLET | Freq: Once | ORAL | Status: AC
  Administered 2011-08-31: 25 mg via ORAL
  Filled 2011-08-31: qty 1

## 2011-08-31 MED ORDER — ADULT MULTIVITAMIN W/MINERALS CH
1.0000 | ORAL_TABLET | Freq: Every day | ORAL | Status: DC
  Administered 2011-08-31 – 2011-09-01 (×2): 1 via ORAL
  Filled 2011-08-31 (×2): qty 1

## 2011-08-31 MED ORDER — THIAMINE HCL 100 MG/ML IJ SOLN: 100.0000 mg | Freq: Every day | INTRAMUSCULAR | Status: DC

## 2011-08-31 MED ORDER — IBUPROFEN 600 MG PO TABS
600.0000 mg | ORAL_TABLET | Freq: Once | ORAL | Status: AC
  Administered 2011-08-31: 600 mg via ORAL
  Filled 2011-08-31: qty 1
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
## 2011-08-31 NOTE — ED Notes (Addendum)
Pt presents to the ED requesting medical clearance for Xanex and ETOH. reports his last beer was at 2 am last night states he drinks about 12-15 12 oz beers q day. Also took a 0.5 mh Xanex last night at 12 am. States he doesn't know how many xanex he is supposed to take he just takes it, "whenever I feel like it." Pt reports he is experiencing some mild GI disturbance denies any other symptoms at this time. Wanted to go to [facility] but they turned him away because they don't take detox patients.   Has a hx of seizures with ETOH withdrawal.

## 2011-08-31 NOTE — [facility] Counselor (Signed)
[name], assessment counselor at [facility], submitted Pt for admission to [facility]. Thurman Coyer, [facility] confirmed there are no adult beds currently available. Dr. Orson Aloe reviewed clinical information and accepted Pt when an appropriate bed is available. Notified Georgina Quint, assessment counselor at [facility], of disposition.  Harlin Rain Patsy Baltimore, LPC

## 2011-08-31 NOTE — ED Provider Notes (Signed)
History     CSN: [id number]  Arrival date & time 08/31/11  4098   First MD Initiated Contact with Patient 08/31/11 [phone]      Chief Complaint  Patient presents with  . Medical Clearance     HPI Pt was seen at 0930.  Per pt, c/o gradual onset and persistence of constant alcohol abuse for the past 4 months.  Pt states he has hx of same "on and off for years" and has been to several etoh detox programs.  Was sober approx 1 year before he resumed drinking etoh approx 4 months ago.  Also endorses he has been taking his roommates xanax for the past several months "when I feel like it."  Pt states he went to [facility] today for detox and was sent to the ED "because they said they don't do detox."  Presents to the ED today for etoh detox.  States he "needs to go to a detox program for legal purposes."  LD etoh approx 2am this morning.  Endorses he feels "a little shakey" now.  Denies SI, no HI.  Denies abd pain, no CP/SOB, no N/V/D.      Past Medical History  Diagnosis Date  . Chest pain   . MVP (mitral valve prolapse)   . Anxiety   . Hypertension   . Coronary artery disease     s/p PCI to RCA in 2007  . Hyperlipidemia   . Palpitations   . Bipolar disorder   . Depression   . Esophagitis   . Gastritis   . Insomnia     Past Surgical History  Procedure Date  . Coronary angioplasty 2007    RCA  . Cardiac catheterization 04/2008    EF 65%  . Joint replacement   . Knee arthroscopy, medial patello femoral ligament repair   . Hip arthroplasty bilateral   . Shoulder arthroscopy right  . Tonsillectomy     Family History  Problem Relation Age of Onset  . Heart disease Neg Hx   . Prostate cancer Father     History  Substance Use Topics  . Smoking status: Current Some Day Smoker  . Smokeless tobacco: Not on file  . Alcohol Use: 0.0 oz/week    12-15 Cans of beer per week    Review of Systems ROS: Statement: All systems negative except as marked or noted in the HPI;  Constitutional: Negative for fever and chills. ; ; Eyes: Negative for eye pain, redness and discharge. ; ; ENMT: Negative for ear pain, hoarseness, nasal congestion, sinus pressure and sore throat. ; ; Cardiovascular: Negative for chest pain, palpitations, diaphoresis, dyspnea and peripheral edema. ; ; Respiratory: Negative for cough, wheezing and stridor. ; ; Gastrointestinal: Negative for nausea, vomiting, diarrhea, abdominal pain, blood in stool, hematemesis, jaundice and rectal bleeding. . ; ; Genitourinary: Negative for dysuria, flank pain and hematuria. ; ; Musculoskeletal: Negative for back pain and neck pain. Negative for swelling and trauma.; ; Skin: Negative for pruritus, rash, abrasions, blisters, bruising and skin lesion.; ; Neuro: Negative for headache, lightheadedness and neck stiffness. Negative for weakness, altered level of consciousness , altered mental status, extremity weakness, paresthesias, involuntary movement, seizure and syncope.; Psych:  No SI, no SA, no HI, no hallucinations.    Allergies  Ambien and Codeine  Home Medications   Current Outpatient Rx  Name Route Sig Dispense Refill  . ASPIRIN 325 MG PO TABS Oral Take 325 mg by mouth daily.      Marland Kitchen  ATORVASTATIN CALCIUM 40 MG PO TABS Oral Take 1 tablet (40 mg total) by mouth daily. 30 tablet 11  . BUPROPION HCL ER (XL) 300 MG PO TB24 Oral Take 300 mg by mouth daily.     . IBUPROFEN 200 MG PO TABS Oral Take 200 mg by mouth every 6 (six) hours as needed.      Marland Kitchen METOPROLOL SUCCINATE ER 25 MG PO TB24 Oral Take 25 mg by mouth daily.    Marland Kitchen NAPHAZOLINE HCL 0.012 % OP SOLN Both Eyes Place 1 drop into both eyes 3 (three) times daily as needed. For dry eyes    . NITROGLYCERIN 0.4 MG SL SUBL Sublingual Place 0.4 mg under the tongue every 5 (five) minutes as needed. For chest pain    . QUETIAPINE FUMARATE ER 50 MG PO TB24 Oral Take 50 mg by mouth at bedtime.    Marland Kitchen ZALEPLON 10 MG PO CAPS Oral Take 10 mg by mouth at bedtime as needed. For  sleep      BP 182/100  Pulse 99  Temp 98.1 F (36.7 C) (Oral)  Resp 16  Wt 180 lb (81.647 kg)  SpO2 100%  Physical Exam 0935: Physical examination:  Nursing notes reviewed; Vital signs and O2 SAT reviewed;  Constitutional: Well developed, Well nourished, Well hydrated, In no acute distress; Head:  Normocephalic, atraumatic; Eyes: EOMI, PERRL, No scleral icterus; ENMT: Mouth and pharynx normal, Mucous membranes moist; Neck: Supple, Full range of motion, No lymphadenopathy; Cardiovascular: Regular rate and rhythm, No gallop; Respiratory: Breath sounds clear & equal bilaterally, No wheezes.  Speaking full sentences with ease, Normal respiratory effort/excursion; Chest: Nontender, Movement normal;; Extremities: Pulses normal, No tenderness, No edema, No calf edema or asymmetry.; Neuro: AA&Ox3, Major CN grossly intact.  Speech clear.  Gait steady. No gross focal motor deficits in extremities.; Skin: Color normal, Warm, Dry; Psych:  No SI, no hallucinations.    ED Course  Procedures   0940:  Left forehead with healed laceration with 5 loose sutures intact.  No erythema, no drainage.  Wound edges appear well approximated.  Pt states he "just got them a week ago," but EPIC chart review reveals pt had sutures placed 07/31/11.  Does not appear infected.  Will remove.   1045:  T/C to [facility], will eval for admit.  Ativan detox protocol ordered.   MDM  MDM Reviewed: nursing note, vitals and previous chart Interpretation: labs   Results for orders placed during the hospital encounter of 08/31/11  URINE RAPID DRUG SCREEN (HOSP PERFORMED)      Component Value Range   Opiates NONE DETECTED  NONE DETECTED   Cocaine NONE DETECTED  NONE DETECTED   Benzodiazepines POSITIVE (*) NONE DETECTED   Amphetamines NONE DETECTED  NONE DETECTED   Tetrahydrocannabinol NONE DETECTED  NONE DETECTED   Barbiturates NONE DETECTED  NONE DETECTED  CBC WITH DIFFERENTIAL      Component Value Range   WBC 4.5  4.0 - 10.5  K/uL   RBC 4.54  4.22 - 5.81 MIL/uL   Hemoglobin 12.4 (*) 13.0 - 17.0 g/dL   HCT 40.9 (*) 81.1 - 91.4 %   MCV 83.0  78.0 - 100.0 fL   MCH 27.3  26.0 - 34.0 pg   MCHC 32.9  30.0 - 36.0 g/dL   RDW 78.2 (*) 95.6 - 21.3 %   Platelets 302  150 - 400 K/uL   Neutrophils Relative 42 (*) 43 - 77 %   Neutro Abs 1.9  1.7 -  7.7 K/uL   Lymphocytes Relative 37  12 - 46 %   Lymphs Abs 1.7  0.7 - 4.0 K/uL   Monocytes Relative 11  3 - 12 %   Monocytes Absolute 0.5  0.1 - 1.0 K/uL   Eosinophils Relative 10 (*) 0 - 5 %   Eosinophils Absolute 0.5  0.0 - 0.7 K/uL   Basophils Relative 1  0 - 1 %   Basophils Absolute 0.0  0.0 - 0.1 K/uL  COMPREHENSIVE METABOLIC PANEL      Component Value Range   Sodium 134 (*) 135 - 145 mEq/L   Potassium 3.8  3.5 - 5.1 mEq/L   Chloride 100  96 - 112 mEq/L   CO2 20  19 - 32 mEq/L   Glucose, Bld 96  70 - 99 mg/dL   BUN 9  6 - 23 mg/dL   Creatinine, Ser 1.61  0.50 - 1.35 mg/dL   Calcium 9.5  8.4 - 09.6 mg/dL   Total Protein 7.2  6.0 - 8.3 g/dL   Albumin 4.2  3.5 - 5.2 g/dL   AST 20  0 - 37 U/L   ALT 13  0 - 53 U/L   Alkaline Phosphatase 82  39 - 117 U/L   Total Bilirubin 0.3  0.3 - 1.2 mg/dL   GFR calc non Af Amer >90  >90 mL/min   GFR calc Af Amer >90  >90 mL/min  ETHANOL      Component Value Range   Alcohol, Ethyl (B) <11  0 - 11 mg/dL              Laray Anger, DO 08/31/11 2016

## 2011-09-01 ENCOUNTER — Inpatient Hospital Stay (HOSPITAL_COMMUNITY)
Admission: AD | Admit: 2011-09-01 | Discharge: 2011-09-06 | DRG: 897 | Disposition: A | Discharge status: Home / Self Care | Payer: Medicare Other | Source: Ambulatory Visit | Attending: Psychiatry | Admitting: Psychiatry

## 2011-09-01 ENCOUNTER — Encounter (HOSPITAL_COMMUNITY): Payer: Self-pay

## 2011-09-01 DIAGNOSIS — F10939 Alcohol use, unspecified with withdrawal, unspecified: Principal | ICD-10-CM | POA: Diagnosis not present

## 2011-09-01 DIAGNOSIS — F1910 Other psychoactive substance abuse, uncomplicated: Secondary | ICD-10-CM

## 2011-09-01 DIAGNOSIS — F1020 Alcohol dependence, uncomplicated: Secondary | ICD-10-CM | POA: Diagnosis present

## 2011-09-01 DIAGNOSIS — I059 Rheumatic mitral valve disease, unspecified: Secondary | ICD-10-CM | POA: Diagnosis present

## 2011-09-01 DIAGNOSIS — E785 Hyperlipidemia, unspecified: Secondary | ICD-10-CM | POA: Diagnosis present

## 2011-09-01 DIAGNOSIS — T84011A Broken internal left hip prosthesis, initial encounter: Secondary | ICD-10-CM | POA: Diagnosis present

## 2011-09-01 DIAGNOSIS — F3110 Bipolar disorder, current episode manic without psychotic features, unspecified: Secondary | ICD-10-CM | POA: Diagnosis present

## 2011-09-01 DIAGNOSIS — F10239 Alcohol dependence with withdrawal, unspecified: Principal | ICD-10-CM | POA: Diagnosis not present

## 2011-09-01 DIAGNOSIS — I2510 Atherosclerotic heart disease of native coronary artery without angina pectoris: Secondary | ICD-10-CM | POA: Diagnosis present

## 2011-09-01 DIAGNOSIS — I10 Essential (primary) hypertension: Secondary | ICD-10-CM | POA: Diagnosis present

## 2011-09-01 MED ORDER — LOPERAMIDE HCL 2 MG PO CAPS: 2.0000 mg | ORAL_CAPSULE | ORAL | Status: AC | PRN

## 2011-09-01 MED ORDER — VITAMIN B-1 100 MG PO TABS
100.0000 mg | ORAL_TABLET | Freq: Every day | ORAL | Status: DC
  Administered 2011-09-02 – 2011-09-05 (×4): 100 mg via ORAL
  Filled 2011-09-01 (×6): qty 1

## 2011-09-01 MED ORDER — CHLORDIAZEPOXIDE HCL 25 MG PO CAPS
25.0000 mg | ORAL_CAPSULE | Freq: Four times a day (QID) | ORAL | Status: AC
  Administered 2011-09-01 – 2011-09-02 (×6): 25 mg via ORAL
  Filled 2011-09-01 (×5): qty 1

## 2011-09-01 MED ORDER — ATORVASTATIN CALCIUM 40 MG PO TABS
40.0000 mg | ORAL_TABLET | Freq: Every day | ORAL | Status: DC
  Administered 2011-09-01 – 2011-09-05 (×5): 40 mg via ORAL
  Filled 2011-09-01 (×3): qty 1
  Filled 2011-09-01: qty 14
  Filled 2011-09-01 (×4): qty 1

## 2011-09-01 MED ORDER — CHLORDIAZEPOXIDE HCL 25 MG PO CAPS
25.0000 mg | ORAL_CAPSULE | Freq: Three times a day (TID) | ORAL | Status: AC
  Administered 2011-09-03 (×3): 25 mg via ORAL
  Filled 2011-09-01 (×4): qty 1

## 2011-09-01 MED ORDER — NITROGLYCERIN 0.4 MG SL SUBL: 0.4000 mg | SUBLINGUAL_TABLET | SUBLINGUAL | Status: DC | PRN

## 2011-09-01 MED ORDER — ADULT MULTIVITAMIN W/MINERALS CH: 1.0000 | ORAL_TABLET | Freq: Every day | ORAL | Status: DC

## 2011-09-01 MED ORDER — CHLORDIAZEPOXIDE HCL 25 MG PO CAPS
25.0000 mg | ORAL_CAPSULE | Freq: Four times a day (QID) | ORAL | Status: AC | PRN
  Administered 2011-09-03: 25 mg via ORAL

## 2011-09-01 MED ORDER — LOPERAMIDE HCL 2 MG PO CAPS: 2.0000 mg | ORAL_CAPSULE | ORAL | Status: DC | PRN

## 2011-09-01 MED ORDER — ONDANSETRON 4 MG PO TBDP: 4.0000 mg | ORAL_TABLET | Freq: Four times a day (QID) | ORAL | Status: DC | PRN

## 2011-09-01 MED ORDER — CHLORDIAZEPOXIDE HCL 25 MG PO CAPS
25.0000 mg | ORAL_CAPSULE | ORAL | Status: AC
  Administered 2011-09-04 (×2): 25 mg via ORAL
  Filled 2011-09-01 (×2): qty 1

## 2011-09-01 MED ORDER — IBUPROFEN 600 MG PO TABS
600.0000 mg | ORAL_TABLET | Freq: Three times a day (TID) | ORAL | Status: DC | PRN
  Administered 2011-09-01: 600 mg via ORAL
  Filled 2011-09-01: qty 1

## 2011-09-01 MED ORDER — ONDANSETRON 4 MG PO TBDP: 4.0000 mg | ORAL_TABLET | Freq: Four times a day (QID) | ORAL | Status: AC | PRN

## 2011-09-01 MED ORDER — THIAMINE HCL 100 MG/ML IJ SOLN
100.0000 mg | Freq: Once | INTRAMUSCULAR | Status: AC
  Administered 2011-09-01: 100 mg via INTRAMUSCULAR

## 2011-09-01 MED ORDER — BUPROPION HCL ER (XL) 300 MG PO TB24
300.0000 mg | ORAL_TABLET | Freq: Every day | ORAL | Status: DC
  Administered 2011-09-01 – 2011-09-05 (×5): 300 mg via ORAL
  Filled 2011-09-01 (×3): qty 1
  Filled 2011-09-01: qty 14
  Filled 2011-09-01 (×5): qty 1

## 2011-09-01 MED ORDER — CHLORDIAZEPOXIDE HCL 25 MG PO CAPS
25.0000 mg | ORAL_CAPSULE | Freq: Four times a day (QID) | ORAL | Status: AC | PRN
  Filled 2011-09-01: qty 1

## 2011-09-01 MED ORDER — IBUPROFEN 800 MG PO TABS
800.0000 mg | ORAL_TABLET | Freq: Three times a day (TID) | ORAL | Status: DC | PRN
  Administered 2011-09-02 – 2011-09-05 (×7): 800 mg via ORAL
  Filled 2011-09-01 (×7): qty 1

## 2011-09-01 MED ORDER — ASPIRIN 325 MG PO TABS
325.0000 mg | ORAL_TABLET | Freq: Every day | ORAL | Status: DC
  Administered 2011-09-01 – 2011-09-05 (×5): 325 mg via ORAL
  Filled 2011-09-01 (×2): qty 1
  Filled 2011-09-01: qty 14
  Filled 2011-09-01 (×6): qty 1

## 2011-09-01 MED ORDER — CHLORDIAZEPOXIDE HCL 25 MG PO CAPS
25.0000 mg | ORAL_CAPSULE | Freq: Every day | ORAL | Status: AC
  Administered 2011-09-05: 25 mg via ORAL
  Filled 2011-09-01: qty 1

## 2011-09-01 MED ORDER — VITAMIN B-1 100 MG PO TABS: 100.0000 mg | ORAL_TABLET | Freq: Every day | ORAL | Status: DC

## 2011-09-01 MED ORDER — THIAMINE HCL 100 MG/ML IJ SOLN: 100.0000 mg | Freq: Once | INTRAMUSCULAR | Status: DC

## 2011-09-01 MED ORDER — HYDROXYZINE HCL 25 MG PO TABS: 25.0000 mg | ORAL_TABLET | Freq: Four times a day (QID) | ORAL | Status: DC | PRN

## 2011-09-01 MED ORDER — QUETIAPINE FUMARATE ER 50 MG PO TB24
150.0000 mg | ORAL_TABLET | Freq: Every day | ORAL | Status: DC
  Administered 2011-09-01 – 2011-09-03 (×3): 150 mg via ORAL
  Filled 2011-09-01 (×6): qty 3

## 2011-09-01 MED ORDER — ALUM & MAG HYDROXIDE-SIMETH 200-200-20 MG/5ML PO SUSP: 30.0000 mL | ORAL | Status: DC | PRN

## 2011-09-01 MED ORDER — METOPROLOL SUCCINATE ER 25 MG PO TB24
25.0000 mg | ORAL_TABLET | Freq: Every day | ORAL | Status: DC
  Administered 2011-09-01 – 2011-09-05 (×5): 25 mg via ORAL
  Filled 2011-09-01 (×2): qty 1
  Filled 2011-09-01: qty 14
  Filled 2011-09-01 (×6): qty 1

## 2011-09-01 MED ORDER — ACETAMINOPHEN 325 MG PO TABS
650.0000 mg | ORAL_TABLET | Freq: Four times a day (QID) | ORAL | Status: DC | PRN
  Administered 2011-09-01 (×2): 650 mg via ORAL
  Filled 2011-09-01: qty 2

## 2011-09-01 MED ORDER — MAGNESIUM HYDROXIDE 400 MG/5ML PO SUSP: 30.0000 mL | Freq: Every day | ORAL | Status: DC | PRN

## 2011-09-01 MED ORDER — ADULT MULTIVITAMIN W/MINERALS CH
1.0000 | ORAL_TABLET | Freq: Every day | ORAL | Status: DC
  Administered 2011-09-01 – 2011-09-05 (×5): 1 via ORAL
  Filled 2011-09-01 (×7): qty 1

## 2011-09-01 MED ORDER — HYDROXYZINE HCL 25 MG PO TABS: 25.0000 mg | ORAL_TABLET | Freq: Four times a day (QID) | ORAL | Status: AC | PRN

## 2011-09-01 NOTE — Tx Team (Signed)
Initial Interdisciplinary Treatment Plan  PATIENT STRENGTHS: (choose at least two) Active sense of humor Capable of independent living General fund of knowledge Religious Affiliation Special hobby/interest Supportive family/friends  PATIENT STRESSORS: Health problems Legal issue Substance abuse   PROBLEM LIST: Problem List/Patient Goals Date to be addressed Date deferred Reason deferred Estimated date of resolution  Substance Abuse 09/01/11     Anxiety 09/01/11     Legal Isssues 09/01/11                                          DISCHARGE CRITERIA:  Ability to meet basic life and health needs Adequate post-discharge living arrangements Medical problems require only outpatient monitoring Motivation to continue treatment in a less acute level of care Need for constant or close observation no longer present Verbal commitment to aftercare and medication compliance Withdrawal symptoms are absent or subacute and managed without 24-hour nursing intervention  PRELIMINARY DISCHARGE PLAN: Attend 12-step recovery group  PATIENT/FAMIILY INVOLVEMENT: This treatment plan has been presented to and reviewed with the patient, AGNES PROBERT.  The patient and family have been given the opportunity to ask questions and make suggestions.  Alfonse Spruce 09/01/2011, 5:20 PM
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
## 2011-09-01 NOTE — Progress Notes (Signed)
[facility] Group Notes:  (Counselor/Nursing/MHT/Case Management/Adjunct)  09/01/2011 10:07 PM  Type of Therapy:  AA Meeting  Participation Level:  Active  Participation Quality:  Appropriate  Affect:  Appropriate  Cognitive:  Appropriate  Insight:  Good  Engagement in Group:  Good  Engagement in Therapy:  Good  Modes of Intervention:  Support  Summary of Progress/Problems:   [name] 09/01/2011, 10:07 PM

## 2011-09-01 NOTE — ED Notes (Signed)
Report called to [place], Charity fundraiser at [facility].  BH requesting pt to be sent at 1530.
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
## 2011-09-01 NOTE — BH Assessment (Signed)
[facility] Assessment Progress Note      Notified by Angela Cox @[facility]  that pt's bed is ready. [facility] counselor working on support paperwork for pt transfer. Pt assigned to bed 300-2 accepted by Dr. Dan Humphreys assigned to Dr. Koren Shiver.
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
## 2011-09-02 DIAGNOSIS — F1010 Alcohol abuse, uncomplicated: Secondary | ICD-10-CM

## 2011-09-02 DIAGNOSIS — T84011A Broken internal left hip prosthesis, initial encounter: Secondary | ICD-10-CM | POA: Diagnosis present

## 2011-09-02 DIAGNOSIS — F1910 Other psychoactive substance abuse, uncomplicated: Secondary | ICD-10-CM | POA: Diagnosis present

## 2011-09-02 DIAGNOSIS — F319 Bipolar disorder, unspecified: Secondary | ICD-10-CM

## 2011-09-02 LAB — HEPATIC FUNCTION PANEL
AST: 23 U/L (ref 0–37)
Albumin: 4 g/dL (ref 3.5–5.2)
Alkaline Phosphatase: 78 U/L (ref 39–117)
Total Bilirubin: 0.2 mg/dL — ABNORMAL LOW (ref 0.3–1.2)

## 2011-09-02 NOTE — Progress Notes (Signed)
D.  Pt pleasant and bright on approach.  No complaints voiced other than continued discomfort from coccyx injury which he suffered during fall at work.  Denies SI/HI/hallucinations at present.  Positive for evening group with appropriate participation.  A.  Support and encouragement offered, will continue to monitor.  R.  Pt currently in dayroom interacting with peers, no distress noted.

## 2011-09-02 NOTE — Progress Notes (Signed)
Patient ID: [name], male   DOB: [birth year], 61 y.o.   MRN: [id number]  Pt. did not attend aftercare planning group.

## 2011-09-02 NOTE — Progress Notes (Signed)
Psychoeducational Group Note  Date:  09/02/2011 Time:  0315   Group Topic/Focus:  Healthy Communication:   The focus of this group is to discuss communication, barriers to communication, as well as healthy ways to communicate with others.  Participation Level:  Active  Participation Quality:  Appropriate, Sharing and Supportive  Affect:  Appropriate  Cognitive:  Alert and Appropriate  Insight:  Good  Engagement in Group:  Good  Additional Comments:  Pt attended group, discussing healthy communication skills. Pt was active during group, stating that he feels communication is important in not just relationships but in order to communicate to Dr's and case managers in order to ask for help. Pt described his best quality as being empathy.   Dalia Heading 09/02/2011, 6:57 PM

## 2011-09-02 NOTE — Progress Notes (Signed)
Patient ID: KENSINGTON DUERST, male   DOB: 22-Aug-1950, 61 y.o.   MRN: [id number]   [facility] Group Notes:  (Counselor/Nursing/MHT/Case Management/Adjunct)  09/02/2011 1:15 PM  Type of Therapy:  Group Therapy, Dance/Movement Therapy   Participation Level:  Active  Participation Quality:  Appropriate  Affect:  Appropriate  Cognitive:  Appropriate  Insight:  Good  Engagement in Group:  Good  Engagement in Therapy:  Good  Modes of Intervention:  Clarification, Problem-solving, Role-play, Socialization and Support  Summary of Progress/Problems: Therapist began group by inviting pts to share one thing they were going to take with them once they leave the hospital or one thing they are striving for in recovery. Group members drew a picture of a mountain which represented their struggles and battles. Pt shared that he just needs to keep busy. Pt participated in mountain drawing but did not share. Pt was very active and support during group.     Cassidi Long 09/02/2011. 3:37 PM

## 2011-09-02 NOTE — [facility] Counselor (Signed)
Adult Comprehensive Assessment  Patient ID: [name], male   DOB: 10/04/1950, 61 y.o.   MRN: [id number]  Information Source: Information source: Patient  Current Stressors:  Educational / Learning stressors: N/A  Employment / Job issues: N/A  Family Relationships: N/A  Surveyor, quantity / Lack of resources (include bankruptcy): N/A  Housing / Lack of housing: N/A  Physical health (include injuries & life threatening diseases): Upcoming shoulder and hip surgaries  Social relationships: N/A  Substance abuse: Alcohol and Xanax  Bereavement / Loss: N/A   Living/Environment/Situation:  Living Arrangements: Non-relatives/Friends Living conditions (as described by patient or guardian): Pt. reported conditons are very good  How [name] has patient lived in current situation?: 4 years  What is atmosphere in current home: Comfortable  Family History:  Marital status: Single Does patient have children?: No  Childhood History:  By whom was/is the patient raised?: Both parents Additional childhood history information: Pt. went to [employer] school at the age of 18  Description of patient's relationship with caregiver when they were a child: Pt. reports relationship was good but father was very critical  Patient's description of current relationship with people who raised him/her: Parents are deceased  Does patient have siblings?: Yes Number of Siblings: 2  Description of patient's current relationship with siblings: Pt. reports relationship is good  Did patient suffer any verbal/emotional/physical/sexual abuse as a child?: No Did patient suffer from severe childhood neglect?: No Has patient ever been sexually abused/assaulted/raped as an adolescent or adult?: No Was the patient ever a victim of a crime or a disaster?: No Witnessed domestic violence?: No Has patient been effected by domestic violence as an adult?: No  Education:  Highest grade of school patient has completed: Science writer    Currently a Consulting civil engineer?: No Learning disability?: No  Employment/Work Situation:   Employment situation: On disability Why is patient on disability: Pt is on disability for Bi-polar, knee, hip, and shoulder replacements  How [name] has patient been on disability: 6 years  Patient's job has been impacted by current illness: No What is the longest time patient has a held a job?: 19 years  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 [employer]  Has patient ever been in the [employer]?: No Has patient ever served in Buyer, retail?: No  Financial Resources:   Surveyor, quantity resources: Insurance claims handler (Disability ) Does patient have a Lawyer or guardian?: No  Alcohol/Substance Abuse:   What has been your use of drugs/alcohol within the last 12 months?: Beer (12-15 cans per day) and Xanax (6-8 1/2 milligrams per day)  If attempted suicide, did drugs/alcohol play a role in this?: No Alcohol/Substance Abuse Treatment Hx: Past Tx, Inpatient;Past Tx, Outpatient If yes, describe treatment: [facility], [facility], AA  Has alcohol/substance abuse ever caused legal problems?: Yes  Social Support System:   Patient's Community Support System: Good Describe Community Support System: Roommate, Friends, AA support  Type of faith/religion: Catholic  How does patient's faith help to cope with current illness?: Chief Operating Officer:   Leisure and Hobbies: Reading, oil painting   Strengths/Needs:   What things does the patient do well?: Good listener, making friends, empathetic  In what areas does patient struggle / problems for patient: Staying sober   Discharge Plan:   Does patient have access to transportation?: Yes (Roommate of friend will pick-up) Will patient be returning to same living situation after discharge?: Yes Currently receiving community mental health services: No If no, would patient  like referral for services when discharged?: Yes (What county?) Medical sales representative  ) Does patient have financial barriers related to discharge medications?: No  Summary/Recommendations:   Summary and Recommendations (to be completed by the evaluator): Recommendations for treatment include crisis stabilization, medication management, case management, psychoeducation to teach coping skills, and group therapy.   [name]. 09/02/2011

## 2011-09-02 NOTE — [facility] Suicide Risk Assessment (Signed)
Suicide Risk Assessment  Admission Assessment     Demographic factors:  Assessment Details Time of Assessment: Admission Information Obtained From: Patient Current Mental Status:    see below Loss Factors:  Loss Factors: Loss of significant relationship;Legal issues Historical Factors:   no SI attemtps Risk Reduction Factors:  Risk Reduction Factors: Living with another person, especially a relative  CLINICAL FACTORS:   Alcohol/Substance Abuse/Dependencies  COGNITIVE FEATURES THAT CONTRIBUTE TO RISK:  Closed-mindedness    SUICIDE RISK:   Mild:  Suicidal ideation of limited frequency, intensity, duration, and specificity.  There are no identifiable plans, no associated intent, mild dysphoria and related symptoms, good self-control (both objective and subjective assessment), few other risk factors, and identifiable protective factors, including available and accessible social support.  PLAN OF CARE:  Mental Status Examination/Evaluation:  Objective: Appearance: Disheveled   Psychomotor Activity: Normal   Eye Contact:: Good   Speech: Clear and Coherent and Normal Rate   Volume: Normal   Mood: ok  Affect: Full Range   Thought Process: Clear rational goal oriented - follow lawyer's advice   Orientation: Full   Thought Content: No AVH/psychosis   Suicidal Thoughts: No   Homicidal Thoughts: No   Judgement: Fair   Insight: Fair   DIAGNOSIS:  AXIS I  Alcohol Dep  Bipolar by history   AXIS II  Deferred   AXIS III  See medical history.   AXIS IV  other psychosocial or environmental problems and problems related to legal system/crime   AXIS V  41-50 serious symptoms   Treatment Plan Summary:  Admit for safety and stabilization  Medically support through detox using Librium withdrawl protocol  Would like long term SA at [facility]  Continue outpatient care at [facility], Torien Ramroop 09/02/2011, 4:32 PM

## 2011-09-02 NOTE — H&P (Signed)
  Pt was seen by me today and I agree with the key elements documented in H&P.  

## 2011-09-02 NOTE — Progress Notes (Signed)
D-Patient is out in milieu interacting with peers and attending groups. Excellent group feedback. A- Rates depression and hopelessness at 1. Affect is congruent with stated mood.  C/O tailbone pain. Requested and received prn pain medication. Good insight into recovery process. Denies SI. R- Support and positive feedback given.  Compliant with detox protocol.  Continue current POC and evaluation of treatment goals. Continue 15' checks for safety.

## 2011-09-02 NOTE — H&P (Signed)
Psychiatric Admission Assessment Adult  Patient Identification:  [name] Date of Evaluation:  09/02/2011 60yo S Bisexual Male- has had partners in past CC: Lawyer suggested detox from alcohol & Xanax- [facility] required acute detox first   History of Present Illness: Well known to unit last here September 2011. Has several DWI's from [place] driving with a revoked license and failure to report to court.His lawyer suggested a 30 day detox and patient presented to [facility] Due to history(20 years ago) of having a withdrawal seizure . Has been taking .5mg  Xanax-roommates daily  And has recently been drinking 12-15 12 oz beers a day. Has been blacking out lately but no seizure.    Past Psychiatric History: Last here September 2011.  Says he detoxed at [facility] last year  Followed at [facility] by Dr.Jerry Ladona Ridgel  Substance Abuse History: Since March-April has began drinking and using  Xanax again.  Social History:    reports that he has been smoking.  He does not have any smokeless tobacco history on file. He reports that he drinks alcohol. He reports that he uses illicit drugs (Benzodiazepines) about 7 times per week. 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 Income is disability and he has no Information systems manager at this time.  Says he is Bisexual but has not had a partner for a while.   Family Psych History: Mother -suicide Father -alcohol issues  Older brother -bipolar and 2 brothers also have alcohol issues   Past Medical History:     Past Medical History  Diagnosis Date  . Chest pain   . MVP (mitral valve prolapse)   . Anxiety   . Hypertension   . Coronary artery disease     s/p PCI to RCA in 2007  . Hyperlipidemia   . Palpitations   . Bipolar disorder   . Depression   . Esophagitis   . Gastritis   . Insomnia        Past Surgical History  Procedure Date  . Coronary angioplasty 2007    RCA  . Cardiac catheterization 04/2008    EF 65%  . Joint replacement   . Knee  arthroscopy, medial patello femoral ligament repair   . Hip arthroplasty bilateral   . Shoulder arthroscopy right  . Tonsillectomy   L hip prosthesis has been recalled and R shoulder also needs replacement   Allergies:  Allergies  Allergen Reactions  . Ambien (Zolpidem Tartrate) Other (See Comments)    Sleep Walking  . Codeine Itching    Current Medications:  Prior to Admission medications   Medication Sig Start Date End Date Taking? Authorizing Provider  aspirin 325 MG tablet Take 325 mg by mouth daily.     Yes Historical Provider, MD  atorvastatin (LIPITOR) 40 MG tablet Take 1 tablet (40 mg total) by mouth daily. 09/27/10 09/27/11 Yes Rosalio Macadamia, NP  buPROPion (WELLBUTRIN XL) 300 MG 24 hr tablet Take 300 mg by mouth daily.    Yes Historical Provider, MD  chlorpheniramine (CHLOR-TRIMETON) 4 MG tablet Take 4 mg by mouth 2 (two) times daily as needed. For allergies.   Yes Historical Provider, MD  ibuprofen (ADVIL,MOTRIN) 200 MG tablet Take 400 mg by mouth every 8 (eight) hours as needed. For pain.   Yes Historical Provider, MD  metoprolol succinate (TOPROL-XL) 25 MG 24 hr tablet Take 25 mg by mouth daily.   Yes Historical Provider, MD  naphazoline (CLEAR EYES) 0.012 % ophthalmic solution Place 1 drop into both eyes  3 (three) times daily as needed. For dry eyes   Yes Historical Provider, MD  nitroGLYCERIN (NITROSTAT) 0.4 MG SL tablet Place 0.4 mg under the tongue every 5 (five) minutes as needed. For chest pain   Yes Historical Provider, MD  QUEtiapine (SEROQUEL XR) 50 MG TB24 Take 150 mg by mouth at bedtime.    Yes Historical Provider, MD  zaleplon (SONATA) 10 MG capsule Take 10 mg by mouth at bedtime as needed. For sleep   Yes Historical Provider, MD    Mental Status Examination/Evaluation: Objective:  Appearance: Disheveled  Psychomotor Activity:  Normal  Eye Contact::  Good  Speech:  Clear and Coherent and Normal Rate  Volume:  Normal  Mood: fairly cheerful   Affect:  Full  Range  Thought Process:  Clear rational goal oriented - follow lawyer's advice   Orientation:  Full  Thought Content:  No AVH/psychosis   Suicidal Thoughts:  No  Homicidal Thoughts:  No  Judgement:  Fair  Insight:  Fair    DIAGNOSIS:    AXIS I Alcohol Abuse Bipolar by history   AXIS II Deferred  AXIS III See medical history.  AXIS IV other psychosocial or environmental problems and problems related to legal system/crime  AXIS V 41-50 serious symptoms     Treatment Plan Summary: Admit for safety and stabilization  Medically support through detox using Librium withdrawl protocol Would like long term SA at [facility]  Continue outpatient care at [facility] Agree with H&P from [facility]

## 2011-09-03 DIAGNOSIS — F39 Unspecified mood [affective] disorder: Secondary | ICD-10-CM

## 2011-09-03 NOTE — Progress Notes (Signed)
Psychoeducational Group Note  Date:  09/03/2011 Time:  0930  Group Topic/Focus:  Spirituality:   The focus of this group is to discuss how one's spirituality can aide in recovery.  Participation Level:  Did Not Attend   Dalia Heading 09/03/2011, 10:59 AM

## 2011-09-03 NOTE — Progress Notes (Signed)
[facility] Adult Inpatient Family/Significant Other Suicide Prevention Education  Suicide Prevention Education:  Education Completed; [name] (roomate) [phone] has been identified by the patient as the family member/significant other with whom the patient will be residing, and identified as the person(s) who will aid the patient in the event of a mental health crisis (suicidal ideations/suicide attempt).  With written consent from the patient, the family member/significant other has been provided the following suicide prevention education, prior to the and/or following the discharge of the patient.  The suicide prevention education provided includes the following:  Suicide risk factors  Suicide prevention and interventions  National Suicide Hotline telephone number  [facility] assessment telephone number  [facility] Emergency Assistance 911  [facility] and/or Residential Mobile Crisis Unit telephone number  Request made of family/significant other to:  Remove weapons (e.g., guns, rifles, knives), all items previously/currently identified as safety concern.    Remove drugs/medications (over-the-counter, prescriptions, illicit drugs), all items previously/currently identified as a safety concern.  The family member/significant other verbalizes understanding of the suicide prevention education information provided.  The family member/significant other agrees to remove the items of safety concern listed above.  Roommate is worried that the pt "drinks too much" and "takes medications he can't take with drinking". Roommate states that the pt goes to "rehab and drinks the next day". He is worried that the pt takes pills that can kill him and that the pt does not seem to care. Roommate wants to see pt in tx longer than 30 days for he is afraid the pt "will kill himself". He feels the pt needs constant supervision and that the roommate can no longer supply this. Roommate was  given the NA hotline number as well.  Pt. accepted information on suicide prevention, warning signs to look for with suicide and crisis line numbers to use. The pt. agreed to call crisis line numbers if having warning signs or having thoughts of suicide.    [facility] 09/03/2011, 10:16 AM

## 2011-09-03 NOTE — Progress Notes (Signed)
[facility] Group Notes:  (Counselor/Nursing/MHT/Case Management/Adjunct)  09/03/2011 1:15 PM  Type of Therapy:  Group Therapy, Dance/Movement Therapy   Participation Level:  Did Not Attend  Summary of Progress/Problems: pt. accepted the daily workbook on Healthy Supports.     Lynnette Pote 09/03/2011. 9:35 AM  

## 2011-09-03 NOTE — Progress Notes (Signed)
Dr. Theotis Barrio contacted with last 3 BP numbers. Instructed to administer prn Librium 25mg  and recheck 2 hours. Informed patient to tell staff of sudden onset HA,blurred vision, or dizziness.

## 2011-09-03 NOTE — Progress Notes (Signed)
Patient ID: [name], male   DOB: [birth year], 61 y.o.   MRN: [id number] Pt is resting quietly, eyes are closed, resp reg, unlabored, no c/o's voiced.  Will continue to monitor q 15 minutes for safety.

## 2011-09-03 NOTE — Progress Notes (Signed)
[facility] Group Notes:  (Counselor/Nursing/MHT/Case Management/Adjunct)  09/02/2011 2100  Type of Therapy:  wrap up group  Participation Level:  Active  Participation Quality:  Appropriate, Attentive and Sharing  Affect:  Appropriate  Cognitive:  Appropriate  Insight:  Good  Engagement in Group:  Good  Engagement in Therapy:  unknown to this Clinical research associate  Modes of Intervention:  Clarification, Education and Support  Summary of Progress/Problems: Patient stated that his friend of 41 years came to visit him and brought him some books today.  Pt especially enjoyed connecting with some of the patients and sharing stories today.  Patient is having a lot of pain with his fractured tail bone but is pleased with staffs attention and consideration of his pain.  Pt is grateful for today and takes one day at a time.    Shelah Lewandowsky 09/03/2011, 2:41 AM

## 2011-09-03 NOTE — Progress Notes (Signed)
[facility] Group Notes:  (Counselor/Nursing/MHT/Case Management/Adjunct)  09/03/2011 1:15 PM  Type of Therapy:  Group Therapy, Dance/Movement Therapy   Participation Level:  Active  Participation Quality:  Appropriate, Sharing and Supportive  Affect:  Appropriate  Cognitive:  Appropriate  Insight:  Good  Engagement in Group:  Good  Engagement in Therapy:  Good  Modes of Intervention:  Clarification, Problem-solving, Role-play, Socialization and Support  Summary of Progress/Problems: Pt spoke about how support relates to support we get from others and the support we give ourselves. Pt participated in the reading of the 12 Promises and how these offer hope that things will get better. Pt spoke about knowing that he needs hope and laughter to have recovery  [facility] 09/03/2011. 2:45 PM

## 2011-09-03 NOTE — Progress Notes (Signed)
[facility] MD Progress Note  09/03/2011 11:56 AM  Diagnosis:   Axis I: Alcohol Abuse and Mood Disorder NOS Axis II: Deferred Axis III:  Past Medical History  Diagnosis Date  . Chest pain   . MVP (mitral valve prolapse)   . Anxiety   . Hypertension   . Coronary artery disease     s/p PCI to RCA in 2007  . Hyperlipidemia   . Palpitations   . Bipolar disorder   . Depression   . Esophagitis   . Gastritis   . Insomnia    Subjective: [name] reports that he is doing well today. He had some difficulty falling asleep last night, but was able to eat sleep from 3 AM until breakfast, and then took a nap after breakfast. He reports that his appetite is good. He denies any cravings for alcohol. He is experiencing mild alcohol withdrawal symptoms, and displays a fine hand tremor. He denies any suicidal or homicidal ideation. He denies any auditory or visual hallucinations.  ADL's:  Intact  Sleep: Poor  Appetite:  Good  Suicidal Ideation:  Patient denies any thought, plan, or intent Homicidal Ideation:  Patient denies any thought, plan, or intent  AEB (as evidenced by):  Mental Status Examination/Evaluation: Objective:  Appearance: Fairly Groomed  Eye Contact::  Good  Speech:  Clear and Coherent  Volume:  Normal  Mood:  Anxious  Affect:  Congruent  Thought Process:  Tangential  Orientation:  Full  Thought Content:  WDL  Suicidal Thoughts:  No  Homicidal Thoughts:  No  Memory:  Immediate;   Good Recent;   Good Remote;   Good  Judgement:  Fair  Insight:  Fair  Psychomotor Activity:  Normal  Concentration:  Good  Recall:  Good  Akathisia:  No  Handed:    AIMS (if indicated):     Assets:  Communication Skills Desire for Improvement Financial Resources/Insurance Resilience Vocational/Educational  Sleep:  Number of Hours: 5.75    Vital Signs:Blood pressure 137/96, pulse 83, temperature 97.8 F (36.6 C), temperature source Oral, resp. rate 20, height 6\' 8"  (2.032 m), weight  84.369 kg (186 lb). Current Medications: Current Facility-Administered Medications  Medication Dose Route Frequency Provider Last Rate Last Dose  . alum & mag hydroxide-simeth (MAALOX/MYLANTA) 200-200-20 MG/5ML suspension 30 mL  30 mL Oral Q4H PRN Verne Spurr, PA-C      . aspirin tablet 325 mg  325 mg Oral Daily Verne Spurr, PA-C   325 mg at 09/03/11 1610  . atorvastatin (LIPITOR) tablet 40 mg  40 mg Oral Daily Verne Spurr, PA-C   40 mg at 09/03/11 9604  . buPROPion (WELLBUTRIN XL) 24 hr tablet 300 mg  300 mg Oral Daily Verne Spurr, PA-C   300 mg at 09/03/11 5409  . chlordiazePOXIDE (LIBRIUM) capsule 25 mg  25 mg Oral Q6H PRN Verne Spurr, PA-C      . chlordiazePOXIDE (LIBRIUM) capsule 25 mg  25 mg Oral Q6H PRN Verne Spurr, PA-C      . chlordiazePOXIDE (LIBRIUM) capsule 25 mg  25 mg Oral QID Verne Spurr, PA-C   25 mg at 09/02/11 2127   Followed by  . chlordiazePOXIDE (LIBRIUM) capsule 25 mg  25 mg Oral TID Verne Spurr, PA-C   25 mg at 09/03/11 0813   Followed by  . chlordiazePOXIDE (LIBRIUM) capsule 25 mg  25 mg Oral BH-qamhs Neil Mashburn, PA-C       Followed by  . chlordiazePOXIDE (LIBRIUM) capsule 25 mg  25 mg  Oral Daily Verne Spurr, PA-C      . hydrOXYzine (ATARAX/VISTARIL) tablet 25 mg  25 mg Oral Q6H PRN Verne Spurr, PA-C      . ibuprofen (ADVIL,MOTRIN) tablet 800 mg  800 mg Oral Q8H PRN Curlene Labrum Readling, MD   800 mg at 09/02/11 1726  . loperamide (IMODIUM) capsule 2-4 mg  2-4 mg Oral PRN Verne Spurr, PA-C      . magnesium hydroxide (MILK OF MAGNESIA) suspension 30 mL  30 mL Oral Daily PRN Verne Spurr, PA-C      . metoprolol succinate (TOPROL-XL) 24 hr tablet 25 mg  25 mg Oral Daily Verne Spurr, PA-C   25 mg at 09/03/11 0981  . multivitamin with minerals tablet 1 tablet  1 tablet Oral Daily Verne Spurr, PA-C   1 tablet at 09/03/11 1914  . nitroGLYCERIN (NITROSTAT) SL tablet 0.4 mg  0.4 mg Sublingual Q5 min PRN Verne Spurr, PA-C      . ondansetron  (ZOFRAN-ODT) disintegrating tablet 4 mg  4 mg Oral Q6H PRN Verne Spurr, PA-C      . QUEtiapine (SEROQUEL XR) 24 hr tablet 150 mg  150 mg Oral QHS Verne Spurr, PA-C   150 mg at 09/02/11 2127  . thiamine (VITAMIN B-1) tablet 100 mg  100 mg Oral Daily Verne Spurr, PA-C   100 mg at 09/03/11 7829    Lab Results:  Results for orders placed during the hospital encounter of 09/01/11 (from the past 48 hour(s))  HEPATIC FUNCTION PANEL     Status: Abnormal   Collection Time   09/02/11  7:31 PM      Component Value Range Comment   Total Protein 7.0  6.0 - 8.3 g/dL    Albumin 4.0  3.5 - 5.2 g/dL    AST 23  0 - 37 U/L    ALT 12  0 - 53 U/L    Alkaline Phosphatase 78  39 - 117 U/L    Total Bilirubin 0.2 (*) 0.3 - 1.2 mg/dL    Bilirubin, Direct <5.6  0.0 - 0.3 mg/dL    Indirect Bilirubin NOT CALCULATED  0.3 - 0.9 mg/dL     Physical Findings: AIMS: Facial and Oral Movements Muscles of Facial Expression: Minimal Lips and Perioral Area: None, normal Jaw: None, normal Tongue: None, normal,Extremity Movements Upper (arms, wrists, hands, fingers): None, normal Lower (legs, knees, ankles, toes): None, normal, Trunk Movements Neck, shoulders, hips: None, normal, Overall Severity Severity of abnormal movements (highest score from questions above): None, normal Incapacitation due to abnormal movements: None, normal Patient's awareness of abnormal movements (rate only patient's report): No Awareness, Dental Status Current problems with teeth and/or dentures?: No Does patient usually wear dentures?: No  CIWA:  CIWA-Ar Total: 1  COWS:     Treatment Plan Summary: Daily contact with patient to assess and evaluate symptoms and progress in treatment Medication management  Plan: We will continue his safe medical detox, and refer for a residential treatment facility as requested by his attorney.  [name] 09/03/2011, 11:56 AM

## 2011-09-03 NOTE — Progress Notes (Signed)
[facility] Group Notes:  (Counselor/Nursing/MHT/Case Management/Adjunct)  09/03/2011 6:30 PM  Type of Therapy:  Psychoeducational Skills  Participation Level:  Did Not Attend   [name] 09/03/2011, 6:30 PM

## 2011-09-03 NOTE — Progress Notes (Signed)
D- Patient interacts with peers in dayroom but has been resistant to groups today.  States"too many groups". A- C/O tremors and agitation r/t withdrawal. Minimizes SA. Denies SI. Continues to c/o tailbone pain but no prn medications requested.  R- 12 step concepts reenforced. Continue with current POC and evaluation of treatment goals.Continue 15' checks for safety.

## 2011-09-04 DIAGNOSIS — F1910 Other psychoactive substance abuse, uncomplicated: Secondary | ICD-10-CM

## 2011-09-04 MED ORDER — QUETIAPINE FUMARATE ER 300 MG PO TB24
300.0000 mg | ORAL_TABLET | Freq: Every day | ORAL | Status: DC
  Administered 2011-09-04 – 2011-09-05 (×2): 300 mg via ORAL
  Filled 2011-09-04 (×3): qty 1
  Filled 2011-09-04: qty 14

## 2011-09-04 NOTE — Progress Notes (Signed)
[facility] Group Notes:  (Counselor/Nursing/MHT/Case Management/Adjunct)  09/04/2011  2:30 PM  Type of Therapy: Group Therapy   Participation Level: Minimal   Participation Quality: Limited  Affect: Appropriate  Cognitive: oriented, alert   Insight: Limited  Engagement in Group: Limited  Modes of Intervention: Clarification, Education, Problem-solving, Socialization,  Encouragement and Support, Activity   Summary of Progress/Problems: Pt participated in group by listening and self disclosing.  After a brief check-in, therapist introduced the topic of the importance of maintaining a balanced lifestyle.   Therapist encouraged patients to identify areas in their lives that are out of balance, and what needed to change to maintain a balance. Pt stated to maintain sobriety, exercise daily and maintain a healthy diet are imperative to his sense of wellness and recovery.  Therapist guided patients in problem solving ways to get these areas back in balance and how to set boundaries.  Pt agreed to focus on making positive changes.  Patient actively participated in the Positive Affirmation Activity by giving and receiving positive affirmations to and from others.  Pt smiled and agreed this exercise was mood elevating. Pt was minimally engaged in the process. Therapist offered support and encouragement.  Minimal progress noted.  Intervention effective.         Christen Butter 09/04/2011  2:30 PM

## 2011-09-04 NOTE — Progress Notes (Signed)
Patient did attend this evenings speaker AA meeting. 

## 2011-09-04 NOTE — Progress Notes (Signed)
Psychoeducational Group Note  Date:  09/04/2011 Time:  1000  Group Topic/Focus:  Wellness Toolbox:   The focus of this group is to discuss various aspects of wellness, balancing those aspects and exploring ways to increase the ability to experience wellness.  Patients will create a wellness toolbox for use upon discharge.  Participation Level:  Did Not Attend  Participation Quality:    Affect:    Cognitive:    Insight:    Engagement in Group:    Additional Comments:  Pt was sleep  Niyanna Asch M 09/04/2011, 10:53 AM

## 2011-09-04 NOTE — Progress Notes (Signed)
Patient ID: [name], male   DOB: [birth year], 61 y.o.   MRN: [id number]  Problem: Polysubstance Abuse  D: Pt pleasant and interacting well in milieu but with dull, flat affect at times.  A: Monitor patient Q 15 minutes for safety, encourage staff/peer interaction and group participation. Administer medications as ordered by MD.  R: Pt compliant with medications, pleasant and denies SI or plans to harm himself. No s/s of distress noted.

## 2011-09-04 NOTE — Progress Notes (Signed)
[facility] MD Progress Note  09/04/2011 3:12 PM  S/O: Patient seen and evaluated. Chart reviewed. Patient stated that his mood was "good". His affect was mood congruent and euthymic. He denied any current thoughts of self injurious behavior, suicidal ideation or homicidal ideation. He denied any significant depressive signs or symptoms at this time. There were no auditory or visual hallucinations, paranoia, delusional thought processes, or mania noted.  Thought process was linear and goal directed, but expansive.  No psychomotor agitation or retardation was noted. His speech was normal rate, tone and volume. Eye contact was good. Judgment and insight are fair.  Patient has been up and engaged on the unit.  No acute safety concerns reported from team.  Sleep:  Number of Hours: 5.5    Vital Signs:Blood pressure 133/87, pulse 74, temperature 96.8 F (36 C), temperature source Oral, resp. rate 16, height 6\' 8"  (2.032 m), weight 84.369 kg (186 lb).  Current Medications:    . aspirin  325 mg Oral Daily  . atorvastatin  40 mg Oral Daily  . buPROPion  300 mg Oral Daily  . chlordiazePOXIDE  25 mg Oral TID   Followed by  . chlordiazePOXIDE  25 mg Oral BH-qamhs   Followed by  . chlordiazePOXIDE  25 mg Oral Daily  . metoprolol succinate  25 mg Oral Daily  . multivitamin with minerals  1 tablet Oral Daily  . QUEtiapine  150 mg Oral QHS  . thiamine  100 mg Oral Daily    Lab Results:  Results for orders placed during the hospital encounter of 09/01/11 (from the past 48 hour(s))  HEPATIC FUNCTION PANEL     Status: Abnormal   Collection Time   09/02/11  7:31 PM      Component Value Range Comment   Total Protein 7.0  6.0 - 8.3 g/dL    Albumin 4.0  3.5 - 5.2 g/dL    AST 23  0 - 37 U/L    ALT 12  0 - 53 U/L    Alkaline Phosphatase 78  39 - 117 U/L    Total Bilirubin 0.2 (*) 0.3 - 1.2 mg/dL    Bilirubin, Direct <4.5  0.0 - 0.3 mg/dL    Indirect Bilirubin NOT CALCULATED  0.3 - 0.9 mg/dL     Physical  Findings: CIWA:  CIWA-Ar Total: 1   A/P: Alcohol Use & W/D Disorder; BPAD, current episode hypomanic  1. Continue taper off alcohol with librium taper, BP improving. 2. Increase Seroquel to 300mg  qhs for mood stability and insomnia. 3. Pt interested in transtion to [facility], ELOS 1-2 more days.  Medication education completed.  Pros, cons, risks, potential side effects and benefits were discussed with pt.  Pt agreeable with the plan.  See orders.  Discussed with team.  Lupe Carney 09/04/2011, 3:12 PM

## 2011-09-04 NOTE — Progress Notes (Signed)
Psychoeducational Group Note  Date:  09/04/2011 Time:  1100  Group Topic/Focus:  Wellness Toolbox:   The focus of this group is to discuss various aspects of wellness, balancing those aspects and exploring ways to increase the ability to experience wellness.  Patients will create a wellness toolbox for use upon discharge.  Participation Level: Did Not Attend  Participation Quality:  Not Applicable  Affect:  Not Applicable  Cognitive:  Not Applicable  Insight:  Not Applicable  Engagement in Group: Not Applicable  Additional Comments:  Pt stated that he did not feel well enough to attend the 1100 Group.  Christ Kick 09/04/2011, 6:26 PM

## 2011-09-04 NOTE — Progress Notes (Signed)
Pt has been up for groups today.  He reported his sleep fair and felt he got at least 6 hours of sleep and was documented 5.5.  He reports his appetite is improving along with he energy level.  He rated both his depression and hopelessness a 0 on his self-inventory.  He does admit to some anxiety due to thinking about leaving and getting into treatment.  He denied any symptoms of withdrawal except for some slight tremor and agitation which he really does not feel is coming from his detox.  He wants to go to long-term treatment from here. He wants to go to [facility] residential from here. He will be starting seroquel 300 mg XR tonight.  He denied any S/H ideations or A/V hallucinations.  He did receive ibuprofen at 0759 for lower back pain which did relieve his pain.

## 2011-09-05 MED ORDER — ASPIRIN 325 MG PO TABS: 325.0000 mg | ORAL_TABLET | Freq: Every day | ORAL | Status: DC

## 2011-09-05 MED ORDER — METOPROLOL SUCCINATE ER 25 MG PO TB24: 25.0000 mg | ORAL_TABLET | Freq: Every day | ORAL | Status: DC

## 2011-09-05 MED ORDER — QUETIAPINE FUMARATE ER 300 MG PO TB24: 300.0000 mg | ORAL_TABLET | Freq: Every day | ORAL | Status: DC

## 2011-09-05 MED ORDER — BUPROPION HCL ER (XL) 300 MG PO TB24: 300.0000 mg | ORAL_TABLET | Freq: Every day | ORAL | Status: DC

## 2011-09-05 MED ORDER — ATORVASTATIN CALCIUM 40 MG PO TABS: 40.0000 mg | ORAL_TABLET | Freq: Every day | ORAL | Status: DC

## 2011-09-05 NOTE — Progress Notes (Signed)
[facility] Case Management Discharge Plan:  Will you be returning to the same living situation after discharge: No. At discharge, do you have transportation home?:Yes,  auto Do you have the ability to pay for your medications:Yes,  insurance  Interagency Information:     Release of information consent forms completed and in the chart;  [name].  Patient to Follow up at:  Follow-up Information    Follow up with [facility] on 09/06/2011. (Be there at 8AM sharp)    Contact information:   [address]  [phone]         Patient denies SI/HI:   Yes,  yes    Safety Planning and Suicide Prevention discussed:  Yes,  yes  Barrier to discharge identified:No.  Summary and Recommendations:   [name] 09/05/2011, 12:14 PM

## 2011-09-05 NOTE — Progress Notes (Signed)
[facility] Group Notes:  (Counselor/Nursing/MHT/Case Management/Adjunct)  09/05/2011   Type of Therapy:  Group Therapy at 1:15 to 2:30 PM  Participation Level:  Did Not Attend   Clide Dales 09/05/2011

## 2011-09-05 NOTE — Progress Notes (Signed)
Pt has been up and active in the milieu today.  He reported sleep well appetite good energy normal and ability to pay attention as good.  He denied any depression or hopelessness on his self-inventory.  He did rate his anxiety a 5 but stated,"I am just ready to get on with treatment and back to my life"  He denied any S/H ideations or A/V hallucinations.  Pt only physical complaint  today has been dizziness.  We discussed how he should change positions slowly and voiced understanding as well as he demonstrated how he would get up and about.  He is planning on discharge in the morning to [facility] residential.  He must be there by 0800 sharpe and he is aware as well as staff.  This information will be passes off to the oncoming nurse.

## 2011-09-05 NOTE — Discharge Planning (Addendum)
[name] came to AM group yesterday but left early due to pain.  Did not come today.Has attended groups minimally, complaining of pain due to a broken tailbone.  Yesterday he told me he has been accepted at [facility] rehab.  I called [name] at [facility], who confirmed bed for tomorrow for [name].  Will d/c at 7:00 Wed.  [name] states he will give ride to 2 other patients who are also going to [facility].

## 2011-09-05 NOTE — Progress Notes (Addendum)
09/05/2011      Time: 1500      Group Topic/Focus: The focus of this group is on discussing various styles of communication and communicating assertively using 'I' (feeling) statements.  Participation Level: Active  Participation Quality: Appropriate  Affect: Appropriate  Cognitive: Oriented   Additional Comments: None.  [name] 09/05/2011 3:44 PM  

## 2011-09-05 NOTE — Progress Notes (Signed)
Pt attended AA group. Pt appeared attentive, affect was appropriate.

## 2011-09-05 NOTE — Progress Notes (Signed)
[facility] Group Notes:  (Counselor/Nursing/MHT/Case Management/Adjunct)  09/05/2011 1:51 PM  Type of Therapy:  Psychoeducational Skills  Participation Level:  Active  Participation Quality:  Appropriate  Affect:  Appropriate  Cognitive:  Appropriate  Insight:  Good  Engagement in Group:  Good  Engagement in Therapy:  Good  Modes of Intervention:  Support  Summary of Progress/Problems: Pt attended the 1100 Group and participated in the discussion about recovery goals. Pt stated that one of his goals was to get back to how he felt two months ago.   Christ Kick 09/05/2011, 1:51 PM

## 2011-09-05 NOTE — Progress Notes (Signed)
Pt attended AA group. Pt was attentive, appropriate. Affect was bright.

## 2011-09-05 NOTE — Progress Notes (Signed)
[facility] Group Notes:  (Counselor/Nursing/MHT/Case Management/Adjunct)  09/05/2011 11:11 AM  Type of Therapy:  Psychoeducational Skills  Participation Level:  Did Not Attend  Summary of Progress/Problems: Emin c/o back pain and did not attend Psycho-educational group "Is my anger due to unmet expectations"?    Wandra Scot 09/05/2011, 11:11 AM

## 2011-09-05 NOTE — Treatment Plan (Signed)
Interdisciplinary Treatment Plan Update (Adult)  Date: 09/05/2011  Time Reviewed: 12:20 PM   Progress in Treatment: Attending groups: Yes Participating in groups: Yes Taking medication as prescribed: Yes Tolerating medication: Yes   Family/Significant other contact made:  No Patient understands diagnosis:  Yes  As evidenced by asking for help with detox from alcohol Discussing patient identified problems/goals with staff:  Yes  See below Medical problems stabilized or resolved:  Yes Denies suicidal/homicidal ideation: Yes  In AM group Issues/concerns per patient self-inventory:  Yes  C/O pain Other:  New problem(s) identified: N/A  Reason for Continuation of Hospitalization: Medication stabilization Withdrawal symptoms  Interventions implemented related to continuation of hospitalization: Increase seroquel  Encourage group attendance and participation  Additional comments:  Estimated length of stay: 1 day Discharge Plan: see below New goal(s): N/A  Review of initial/current patient goals per problem list:   1.  Goal(s):Safely detox from alcohol  Met:  Yes  Target date:8/13  As evidenced ZO:XWRUEA vitals, no withdrawal symptoms  2.  Goal (s): Get into [facility] rehab from here  Met:  Yes  Target date:8/13  As evidenced [id number] of bed for tomorrow  3.  Goal(s):Staabilize mood  Met:  No  Target date:8/14  As evidenced FA:OZHYQ will report that the Seroquel has decreased his anxiety and depression, and help him sleep  4.  Goal(s):  Met:  No  Target date:  As evidenced by:  Attendees: Patient:     Family:     Physician:  Lupe Carney 09/05/2011 12:20 PM   Nursing:    09/05/2011 12:20 PM   Case Manager:  Richelle Ito, LCSW 09/05/2011 12:20 PM   Counselor:   09/05/2011 12:20 PM   Other:     Other:     Other:     Other:      Scribe for Treatment Team:   Ida Rogue, 09/05/2011 12:20 PM

## 2011-09-05 NOTE — [facility] Suicide Risk Assessment (Signed)
Suicide Risk Assessment  Discharge Assessment     Demographic factors:  Male  Current Mental Status Per Nursing Assessment:  At Discharge:  Pt denied any SI/HI/thoughts of self harm or acute psychiatric issues.  Current Mental Status Per Physician: Patient seen and evaluated. Chart reviewed. Patient stated that his mood was "good". His affect was mood congruent and euthymic. He denied any current thoughts of self injurious behavior, suicidal ideation or homicidal ideation. He denied any significant depressive signs or symptoms at this time. There were no auditory or visual hallucinations, paranoia, delusional thought processes, or mania noted. Thought process was linear and goal directed, but still expansive. No psychomotor agitation or retardation was noted. His speech was normal rate, tone and volume. Eye contact was good. Judgment and insight are fair. Patient has been up and engaged on the unit. No acute safety concerns reported from team.   Loss Factors: Loss of significant relationship;Legal issues  Historical Factors:  no SI attemtps  Risk Reduction Factors: Living with another person, especially a relative; willing to go to [facility] residential; insight into mood instability; brother  Discharge Diagnoses: Alcohol Use Disorder; BPAD, current episode hypomanic   Past Medical History  Diagnosis Date  . Chest pain   . MVP (mitral valve prolapse)   . Anxiety   . Hypertension   . Coronary artery disease     s/p PCI to RCA in 2007  . Hyperlipidemia   . Palpitations   . Bipolar disorder   . Depression   . Esophagitis   . Gastritis   . Insomnia     Cognitive Features That Contribute To Risk: none.  Suicide Risk: Pt viewed as a chronic moderate increased risk of harm to self in light of his past hx and risk factors.  No acute safety concerns on the unit.  Pt contracting for safety and stable for discharge to [facility] in morning.  Plan Of Care/Follow-up recommendations: Pt seen  and evaluated. Chart reviewed.  Pt stable for and requesting discharge in am to [facility]. Pt contracting for safety and does not currently meet [place] involuntary commitment criteria for continued hospitalization against his will.  Hypomania improved with increase in Seroquel.  Mental health treatment, medication management and continued sobriety will mitigate against the potential increased risk of harm to self and/or others.  Discussed the importance of recovery further with pt, as well as, tools to move forward in a healthy & safe manner.  Pt agreeable with the plan.  Discussed with the team.  Please see orders, follow up appointments per AVS and full discharge summary.  Recommend follow up with AA.  Diet: Heart healthy.  Activity: As tolerated.     Lupe Carney 09/05/2011, 4:10 PM

## 2011-09-06 NOTE — Progress Notes (Signed)
Pt was pleasant and cooperative during the discharge process. Writer discussed discharge instructions and follow up appts.Pt acknowledged understanding of orders. Pt denied SI and HI.  Samples and prescriptions were provided. Doristine Johns

## 2011-09-06 NOTE — Progress Notes (Signed)
Patient ID: [name], male   DOB: [birth year], 61 y.o.   MRN: [id number]  D:  Pt was pleasant and cooperative during the assessment. Pt was observed interacting with peers in the dayroom. Pt informed the writer that he "only came due to court order". Stated he has 2 DWI's and it was "either come here or go to jail".  Pt denied report given by previous RN that he'd attempted to get a gun from a friend and shoot himself. Pt plans to attend [facility] in the morn.  A:  Support and encouragement was offered. 15 min checks continued for safety.    R: Pt remains safe.

## 2011-09-08 ENCOUNTER — Ambulatory Visit: Payer: Medicare Other | Admitting: Gastroenterology

## 2011-09-08 NOTE — Progress Notes (Signed)
Patient Discharge Instructions:  After Visit Summary (AVS):   Faxed to:  09/08/2011 Psychiatric Admission Assessment Note:   Faxed to:  09/08/2011 Suicide Risk Assessment - Discharge Assessment:   Faxed to:  09/08/2011 Faxed/Sent to the Next Level Care provider:  09/08/2011  Faxed to [facility] @ [phone]  Wandra Scot, 09/08/2011, 2:29 PM

## 2011-10-01 NOTE — Discharge Summary (Signed)
Physician Discharge Summary Note  Patient:  [name] is an 61 y.o., male MRN:  [id number] DOB:  [birth year] Patient phone:  [phone] (home)  Patient address:   [address] [place] [address]  Date of Admission:  09/01/2011 Date of Discharge: 09/06/11  Reason for Admission: see H&P.  Principal Problem:  *Polysubstance abuse Active Problems:  Broken internal left hip prosthesis  Discharge Diagnoses: Discharge Diagnoses: Alcohol Use Disorder; BPAD, current episode hypomanic   Past Medical History   Diagnosis  Date   .  Chest pain    .  MVP (mitral valve prolapse)    .  Anxiety    .  Hypertension    .  Coronary artery disease      s/p PCI to RCA in 2007   .  Hyperlipidemia    .  Palpitations    .  Bipolar disorder    .  Depression    .  Esophagitis    .  Gastritis    .  Insomnia      Level of Care:  [facility]  Hospital Course:  Pt admitted for crisis stabilization, detox and treatment. Pt attended all therapeutic groups, was active in his treatment planning process and agreed the current medication regimen for further stability during recovery.  There were no acute issues during treatment and he was open to further substance abuse Tx.  All labs were reviewed in great detail and medical/psychiatric needs were addressed.  No acute safety issues were noted on the unit. Medications were reviewed with pt and medication education was provided. Mental health treatment, medication management and continued sobriety will mitigate against any potential increased risk of harm to self and/or others.  Discussed the importance of recovery with pt, as well as, tools to move forward in a healthy & safe manner using the 12 Step Process.    Consults:  None.  Significant Diagnostic Studies:  See labs.  Discharge Vitals:   Blood pressure 118/82, pulse 77, temperature 97.5 F (36.4 C), temperature source Oral, resp. rate 20, height 6\' 8"  (2.032 m), weight 84.369 kg (186 lb).  Physical  Findings: AIMS: Facial and Oral Movements Muscles of Facial Expression: None, normal Lips and Perioral Area: None, normal Jaw: None, normal Tongue: None, normal,Extremity Movements Upper (arms, wrists, hands, fingers): None, normal Lower (legs, knees, ankles, toes): None, normal, Trunk Movements Neck, shoulders, hips: None, normal, Overall Severity Severity of abnormal movements (highest score from questions above): None, normal Incapacitation due to abnormal movements: None, normal Patient's awareness of abnormal movements (rate only patient's report): No Awareness, Dental Status Current problems with teeth and/or dentures?: No Does patient usually wear dentures?: No  CIWA:  CIWA-Ar Total: 0  COWS:     Mental Status Exam: See Mental Status Examination and Suicide Risk Assessment completed by Attending Physician prior to discharge.  Discharge destination:  [facility]  Is patient on multiple antipsychotic therapies at discharge:  No   Has Patient had three or more failed trials of antipsychotic monotherapy by history:  No  Recommended Plan for Multiple Antipsychotic Therapies: NA.  Discharge Orders    Future Appointments: Provider: Department: Dept Phone: Center:   10/17/2011 1:30 PM Mardella Layman, MD [facility] Office [phone] [facility]     Future Orders Please Complete By Expires   Diet - low sodium heart healthy      Increase activity slowly      Discharge instructions      Comments:   Take all  of your medications as prescribed.  Be sure to keep ALL follow up appointments as scheduled. This is to ensure getting your refills on time to avoid any interruption in your medication.  If you find that you can not keep your appointment, call the clinic and reschedule. Be sure to tell the nurse if you will need a refill before your appointment.     Medication List  As of 10/01/2011 10:05 PM   STOP taking these medications         chlorpheniramine 4 MG tablet        ibuprofen 200 MG tablet      zaleplon 10 MG capsule         TAKE these medications      Indication    aspirin 325 MG tablet   Take 1 tablet (325 mg total) by mouth daily. For heart attack prevention.       atorvastatin 40 MG tablet   Commonly known as: LIPITOR   Take 1 tablet (40 mg total) by mouth daily. For elevated cholesterol.       buPROPion 300 MG 24 hr tablet   Commonly known as: WELLBUTRIN XL   Take 1 tablet (300 mg total) by mouth daily. For depression and anxiety.    Indication: Major Depressive Disorder      metoprolol succinate 25 MG 24 hr tablet   Commonly known as: TOPROL-XL   Take 1 tablet (25 mg total) by mouth daily. For hypertension.    Indication: Fine to Coarse Slow Tremor affecting Head, Hands & Voice, High Blood Pressure      naphazoline 0.012 % ophthalmic solution   Commonly known as: CLEAR EYES   Place 1 drop into both eyes 3 (three) times daily as needed. For dry eyes       nitroGLYCERIN 0.4 MG SL tablet   Commonly known as: NITROSTAT   Place 0.4 mg under the tongue every 5 (five) minutes as needed. For chest pain       QUEtiapine 300 MG 24 hr tablet   Commonly known as: SEROQUEL XR   Take 1 tablet (300 mg total) by mouth at bedtime. For anxiety and depression and insomnia.    Indication: Major Depressive Disorder           Follow-up Information    Follow up with [facility] on 09/06/2011. (Be there at 8AM sharp)    Contact information:   [address]  [phone]         Plan Of Care/Follow-up recommendations: Pt seen and evaluated. Chart reviewed. Pt stable for and requesting discharge in am to [facility]. Pt contracting for safety and does not currently meet [place] involuntary commitment criteria for continued hospitalization against his will. Hypomania improved with increase in Seroquel. Mental health treatment, medication management and continued sobriety will mitigate against the potential increased risk of harm to self  and/or others. Discussed the importance of recovery further with pt, as well as, tools to move forward in a healthy & safe manner. Pt agreeable with the plan. Discussed with the team. Recommend follow up with AA. Diet: Heart healthy. Activity: As tolerated.    Signed: Lupe Carney 10/01/2011, 10:05 PM

## 2011-10-09 ENCOUNTER — Emergency Department (HOSPITAL_COMMUNITY)
Admission: EM | Admit: 2011-10-09 | Discharge: 2011-10-09 | Disposition: A | Discharge status: Home / Self Care | Payer: Medicare Other | Attending: Emergency Medicine | Admitting: Emergency Medicine

## 2011-10-09 DIAGNOSIS — Z0389 Encounter for observation for other suspected diseases and conditions ruled out: Secondary | ICD-10-CM | POA: Insufficient documentation

## 2011-10-11 ENCOUNTER — Encounter (HOSPITAL_COMMUNITY): Payer: Self-pay | Admitting: *Deleted

## 2011-10-11 ENCOUNTER — Emergency Department (HOSPITAL_COMMUNITY)
Admission: EM | Admit: 2011-10-11 | Discharge: 2011-10-11 | Disposition: A | Discharge status: Home / Self Care | Payer: Medicare Other | Attending: Emergency Medicine | Admitting: Emergency Medicine

## 2011-10-11 DIAGNOSIS — Z885 Allergy status to narcotic agent status: Secondary | ICD-10-CM | POA: Insufficient documentation

## 2011-10-11 DIAGNOSIS — Z8249 Family history of ischemic heart disease and other diseases of the circulatory system: Secondary | ICD-10-CM | POA: Insufficient documentation

## 2011-10-11 DIAGNOSIS — F319 Bipolar disorder, unspecified: Secondary | ICD-10-CM | POA: Insufficient documentation

## 2011-10-11 DIAGNOSIS — F411 Generalized anxiety disorder: Secondary | ICD-10-CM | POA: Insufficient documentation

## 2011-10-11 DIAGNOSIS — F1010 Alcohol abuse, uncomplicated: Secondary | ICD-10-CM | POA: Insufficient documentation

## 2011-10-11 DIAGNOSIS — Z8042 Family history of malignant neoplasm of prostate: Secondary | ICD-10-CM | POA: Insufficient documentation

## 2011-10-11 DIAGNOSIS — I2510 Atherosclerotic heart disease of native coronary artery without angina pectoris: Secondary | ICD-10-CM | POA: Insufficient documentation

## 2011-10-11 DIAGNOSIS — Z888 Allergy status to other drugs, medicaments and biological substances status: Secondary | ICD-10-CM | POA: Insufficient documentation

## 2011-10-11 DIAGNOSIS — I10 Essential (primary) hypertension: Secondary | ICD-10-CM | POA: Insufficient documentation

## 2011-10-11 DIAGNOSIS — F17200 Nicotine dependence, unspecified, uncomplicated: Secondary | ICD-10-CM | POA: Insufficient documentation

## 2011-10-11 DIAGNOSIS — Z7982 Long term (current) use of aspirin: Secondary | ICD-10-CM | POA: Insufficient documentation

## 2011-10-11 LAB — COMPREHENSIVE METABOLIC PANEL
ALT: 16 U/L (ref 0–53)
Alkaline Phosphatase: 89 U/L (ref 39–117)
BUN: 10 mg/dL (ref 6–23)
CO2: 22 mEq/L (ref 19–32)
Chloride: 98 mEq/L (ref 96–112)
GFR calc Af Amer: >90 mL/min (ref >90)
GFR calc non Af Amer: 89 mL/min — ABNORMAL LOW (ref >90)
Glucose, Bld: 77 mg/dL (ref 70–99)
Potassium: 4.8 mEq/L (ref 3.5–5.1)
Sodium: 132 mEq/L — ABNORMAL LOW (ref 135–145)
Total Bilirubin: 0.3 mg/dL (ref 0.3–1.2)

## 2011-10-11 LAB — CBC
HCT: 39.1 % (ref 39.0–52.0)
Hemoglobin: 12.8 g/dL — ABNORMAL LOW (ref 13.0–17.0)
MCHC: 32.7 g/dL (ref 30.0–36.0)
RBC: 4.8 MIL/uL (ref 4.22–5.81)

## 2011-10-11 LAB — RAPID URINE DRUG SCREEN, HOSP PERFORMED
Barbiturates: NOT DETECTED
Cocaine: NOT DETECTED
Tetrahydrocannabinol: NOT DETECTED

## 2011-10-11 LAB — ETHANOL: Alcohol, Ethyl (B): 17 mg/dL — ABNORMAL HIGH (ref 0–11)

## 2011-10-11 LAB — ACETAMINOPHEN LEVEL: Acetaminophen (Tylenol), Serum: <15 ug/mL (ref 10–30)

## 2011-10-11 MED ORDER — LORAZEPAM 1 MG PO TABS: 1.0000 mg | ORAL_TABLET | Freq: Three times a day (TID) | ORAL | Status: DC | PRN

## 2011-10-11 MED ORDER — LORAZEPAM 1 MG PO TABS
1.0000 mg | ORAL_TABLET | Freq: Four times a day (QID) | ORAL | Status: DC | PRN
Start: 2011-10-11 — End: 2011-10-11
  Filled 2011-10-11: qty 1

## 2011-10-11 NOTE — ED Notes (Signed)
Pt requesting detox from alcohol and depression. States usually drinks 12-12oz cans of beer a day for the past week. Last drink was last night at 2am. Pt denies SI/HI. Reports severe depression.

## 2011-10-11 NOTE — ED Notes (Signed)
Pt changed into blue scrubs

## 2011-10-11 NOTE — ED Provider Notes (Signed)
History     CSN: [id number]  Arrival date & time 10/11/11  1001   First MD Initiated Contact with Patient 10/11/11 1118      Chief Complaint  Patient presents with  . Medical Clearance    (Consider location/radiation/quality/duration/timing/severity/associated sxs/prior treatment) HPI Comments: [name] is a 61 y.o. Male who is here for requesting alcohol detoxification. He's been drinking for 7 days 10-12 cans per day. He reports depression causes him to drink. He has had this problem previously. He denies weakness, dizziness, nausea, or vomiting. He sees a Therapist, sports at [employer]. He does not have a therapist. There are no other aggravating factors; there are no palliative factors.  The history is provided by the patient.    Past Medical History  Diagnosis Date  . Chest pain   . MVP (mitral valve prolapse)   . Anxiety   . Hypertension   . Coronary artery disease     s/p PCI to RCA in 2007  . Hyperlipidemia   . Palpitations   . Bipolar disorder   . Depression   . Esophagitis   . Gastritis   . Insomnia     Past Surgical History  Procedure Date  . Coronary angioplasty 2007    RCA  . Cardiac catheterization 04/2008    EF 65%  . Joint replacement   . Knee arthroscopy, medial patello femoral ligament repair   . Hip arthroplasty bilateral   . Shoulder arthroscopy right  . Tonsillectomy     Family History  Problem Relation Age of Onset  . Heart disease Neg Hx   . Prostate cancer Father     History  Substance Use Topics  . Smoking status: Current Some Day Smoker  . Smokeless tobacco: Not on file  . Alcohol Use: 0.0 oz/week    12-15 Cans of beer per week      Review of Systems  All other systems reviewed and are negative.    Allergies  Ambien and Codeine  Home Medications   Current Outpatient Rx  Name Route Sig Dispense Refill  . ASPIRIN 325 MG PO TABS Oral Take 325 mg by mouth daily. For heart attack prevention.    . ATORVASTATIN CALCIUM 40 MG  PO TABS Oral Take 40 mg by mouth daily. For elevated cholesterol.    . BUPROPION HCL ER (XL) 300 MG PO TB24 Oral Take 300 mg by mouth daily. For depression and anxiety.    Marland Kitchen METOPROLOL SUCCINATE ER 25 MG PO TB24 Oral Take 25 mg by mouth daily. For hypertension.    Marland Kitchen NAPHAZOLINE HCL 0.012 % OP SOLN Both Eyes Place 1 drop into both eyes 3 (three) times daily as needed. For dry eyes    . NITROGLYCERIN 0.4 MG SL SUBL Sublingual Place 0.4 mg under the tongue every 5 (five) minutes as needed. For chest pain    . QUETIAPINE FUMARATE ER 50 MG PO TB24 Oral Take 150 mg by mouth at bedtime. Take 3 tablets (150 mg total) at bed time    . LORAZEPAM 1 MG PO TABS Oral Take 1 tablet (1 mg total) by mouth 3 (three) times daily as needed for anxiety. 15 tablet 0    BP 139/98  Pulse 67  Temp 98.4 F (36.9 C) (Oral)  Resp 18  SpO2 96%  Physical Exam  Nursing note and vitals reviewed. Constitutional: He is oriented to person, place, and time. He appears well-developed and well-nourished.  HENT:  Head: Normocephalic and atraumatic.  Right  Ear: External ear normal.  Left Ear: External ear normal.  Eyes: Conjunctivae normal and EOM are normal. Pupils are equal, round, and reactive to light.  Neck: Normal range of motion and phonation normal. Neck supple.  Cardiovascular: Normal rate, regular rhythm, normal heart sounds and intact distal pulses.   Pulmonary/Chest: Effort normal and breath sounds normal. He exhibits no bony tenderness.  Abdominal: Soft. Normal appearance. There is no tenderness.  Musculoskeletal: Normal range of motion.  Neurological: He is alert and oriented to person, place, and time. He has normal strength. No cranial nerve deficit or sensory deficit. He exhibits normal muscle tone. Coordination normal.       No tremor. Normal memory.   Skin: Skin is warm, dry and intact.  Psychiatric: He has a normal mood and affect. His behavior is normal. Judgment and thought content normal.    ED  Course  Procedures (including critical care time)  ED Treatment: Ativan PO    Labs Reviewed  CBC - Abnormal; Notable for the following:    Hemoglobin 12.8 (*)     RDW 15.6 (*)     All other components within normal limits  COMPREHENSIVE METABOLIC PANEL - Abnormal; Notable for the following:    Sodium 132 (*)     GFR calc non Af Amer 89 (*)     All other components within normal limits  ETHANOL - Abnormal; Notable for the following:    Alcohol, Ethyl (B) 17 (*)     All other components within normal limits  SALICYLATE LEVEL - Abnormal; Notable for the following:    Salicylate Lvl <2.0 (*)     All other components within normal limits  ACETAMINOPHEN LEVEL  URINE RAPID DRUG SCREEN (HOSP PERFORMED)  LAB REPORT - SCANNED   No results found.   1. Alcohol abuse       MDM  Recurrent Alcohol abuse, doubt risk for DT's. No tremor in ED. Doubt metabolic instability, serious bacterial infection or impending vascular collapse; the patient is stable for discharge.    Plan: Home Medications- Ativan # 15; Home Treatments- See a therapist asap; Recommended follow up- Call [facility] to arrange f/u care        Flint Melter, MD 10/14/11 1359

## 2011-10-17 ENCOUNTER — Ambulatory Visit: Payer: Medicare Other | Admitting: Gastroenterology

## 2011-10-27 ENCOUNTER — Emergency Department (HOSPITAL_COMMUNITY)
Admission: EM | Admit: 2011-10-27 | Discharge: 2011-10-27 | Disposition: A | Discharge status: Home / Self Care | Payer: Medicare Other | Attending: Emergency Medicine | Admitting: Emergency Medicine

## 2011-10-27 ENCOUNTER — Emergency Department (HOSPITAL_COMMUNITY): Payer: Medicare Other

## 2011-10-27 ENCOUNTER — Encounter (HOSPITAL_COMMUNITY): Payer: Self-pay | Admitting: *Deleted

## 2011-10-27 DIAGNOSIS — I059 Rheumatic mitral valve disease, unspecified: Secondary | ICD-10-CM | POA: Insufficient documentation

## 2011-10-27 DIAGNOSIS — R059 Cough, unspecified: Secondary | ICD-10-CM | POA: Insufficient documentation

## 2011-10-27 DIAGNOSIS — F1010 Alcohol abuse, uncomplicated: Secondary | ICD-10-CM | POA: Insufficient documentation

## 2011-10-27 DIAGNOSIS — F411 Generalized anxiety disorder: Secondary | ICD-10-CM | POA: Insufficient documentation

## 2011-10-27 DIAGNOSIS — Z79899 Other long term (current) drug therapy: Secondary | ICD-10-CM | POA: Insufficient documentation

## 2011-10-27 DIAGNOSIS — R079 Chest pain, unspecified: Secondary | ICD-10-CM | POA: Insufficient documentation

## 2011-10-27 DIAGNOSIS — I10 Essential (primary) hypertension: Secondary | ICD-10-CM | POA: Insufficient documentation

## 2011-10-27 DIAGNOSIS — E785 Hyperlipidemia, unspecified: Secondary | ICD-10-CM | POA: Insufficient documentation

## 2011-10-27 DIAGNOSIS — R05 Cough: Secondary | ICD-10-CM | POA: Insufficient documentation

## 2011-10-27 DIAGNOSIS — I2510 Atherosclerotic heart disease of native coronary artery without angina pectoris: Secondary | ICD-10-CM | POA: Insufficient documentation

## 2011-10-27 DIAGNOSIS — F319 Bipolar disorder, unspecified: Secondary | ICD-10-CM | POA: Insufficient documentation

## 2011-10-27 DIAGNOSIS — F17200 Nicotine dependence, unspecified, uncomplicated: Secondary | ICD-10-CM | POA: Insufficient documentation

## 2011-10-27 LAB — COMPREHENSIVE METABOLIC PANEL
AST: 60 U/L — ABNORMAL HIGH (ref 0–37)
Albumin: 4.3 g/dL (ref 3.5–5.2)
Alkaline Phosphatase: 81 U/L (ref 39–117)
BUN: 3 mg/dL — ABNORMAL LOW (ref 6–23)
CO2: 20 mEq/L (ref 19–32)
Chloride: 91 mEq/L — ABNORMAL LOW (ref 96–112)
GFR calc non Af Amer: >90 mL/min (ref >90)
Potassium: 4 mEq/L (ref 3.5–5.1)
Total Bilirubin: 0.2 mg/dL — ABNORMAL LOW (ref 0.3–1.2)

## 2011-10-27 LAB — CBC WITH DIFFERENTIAL/PLATELET
Basophils Absolute: 0 10*3/uL (ref 0.0–0.1)
Basophils Relative: 0 % (ref 0–1)
HCT: 38.6 % — ABNORMAL LOW (ref 39.0–52.0)
Hemoglobin: 13.2 g/dL (ref 13.0–17.0)
Lymphocytes Relative: 55 % — ABNORMAL HIGH (ref 12–46)
MCHC: 34.2 g/dL (ref 30.0–36.0)
Monocytes Absolute: 0.5 10*3/uL (ref 0.1–1.0)
Monocytes Relative: 9 % (ref 3–12)
Neutro Abs: 1.7 10*3/uL (ref 1.7–7.7)
Neutrophils Relative %: 34 % — ABNORMAL LOW (ref 43–77)
WBC: 5 10*3/uL (ref 4.0–10.5)

## 2011-10-27 MED ORDER — MORPHINE SULFATE 4 MG/ML IJ SOLN
4.0000 mg | Freq: Once | INTRAMUSCULAR | Status: AC
  Administered 2011-10-27: 4 mg via INTRAVENOUS
  Filled 2011-10-27: qty 1

## 2011-10-27 MED ORDER — ASPIRIN 325 MG PO TABS
325.0000 mg | ORAL_TABLET | Freq: Once | ORAL | Status: DC
  Filled 2011-10-27: qty 1

## 2011-10-27 MED ORDER — LORAZEPAM 1 MG PO TABS: 1.0000 mg | ORAL_TABLET | Freq: Four times a day (QID) | ORAL | Status: DC | PRN

## 2011-10-27 MED ORDER — LORAZEPAM 2 MG/ML IJ SOLN
1.0000 mg | Freq: Once | INTRAMUSCULAR | Status: AC
  Administered 2011-10-27: 15:00:00 via INTRAVENOUS
  Filled 2011-10-27: qty 1

## 2011-10-27 MED ORDER — LABETALOL HCL 5 MG/ML IV SOLN
10.0000 mg | Freq: Once | INTRAVENOUS | Status: AC
  Administered 2011-10-27: 10 mg via INTRAVENOUS
  Filled 2011-10-27: qty 4

## 2011-10-27 NOTE — ED Notes (Signed)
Pt in via EMS, per EMS- pt in c/o chest pain since 2am today, states pain developed gradually, describes as heaviness, rates 9/10. Also shortness of breath with nausea, also coughing. Pt given nitro SL x2 and 324mg  ASA. ETOH. IV established PTA. Pt denies change in pain since nitro. Initial BP was 200/130.

## 2011-10-27 NOTE — ED Provider Notes (Signed)
History     CSN: [id number]  Arrival date & time 10/27/11  1014   First MD Initiated Contact with Patient 10/27/11 1106      Chief Complaint  Patient presents with  . Chest Pain    (Consider location/radiation/quality/duration/timing/severity/associated sxs/prior treatment) The history is provided by the patient.   patient reports he developed chest pain approximately 2 AM today reported it was a pressure in his anterior chest.  His pressure is been constant throughout the day and morning.  His him as been constant for 10 hours now.  He reports some shortness of breath with nausea.  He's also reported coughing.  His initial blood pressure was 200/130.  He does report compliance with his medications.  He has a history of alcohol abuse but reports that he is "turning it down".  He had 4 beers last night.  He denies fevers or chills.  He has no history of pulmonary embolism or DVT.  He's had no unilateral leg swelling.  He denies recent surgery or long travel.  He does report use of Xanax at home  Past Medical History  Diagnosis Date  . Chest pain   . MVP (mitral valve prolapse)   . Anxiety   . Hypertension   . Coronary artery disease     s/p PCI to RCA in 2007  . Hyperlipidemia   . Palpitations   . Bipolar disorder   . Depression   . Esophagitis   . Gastritis   . Insomnia     Past Surgical History  Procedure Date  . Coronary angioplasty 2007    RCA  . Cardiac catheterization 04/2008    EF 65%  . Joint replacement   . Knee arthroscopy, medial patello femoral ligament repair   . Hip arthroplasty bilateral   . Shoulder arthroscopy right  . Tonsillectomy     Family History  Problem Relation Age of Onset  . Heart disease Neg Hx   . Prostate cancer Father     History  Substance Use Topics  . Smoking status: Current Some Day Smoker  . Smokeless tobacco: Not on file  . Alcohol Use: 0.0 oz/week    12-15 Cans of beer per week      Review of Systems  Cardiovascular:  Positive for chest pain.  All other systems reviewed and are negative.    Allergies  Ambien and Codeine  Home Medications   Current Outpatient Rx  Name Route Sig Dispense Refill  . ATORVASTATIN CALCIUM 40 MG PO TABS Oral Take 40 mg by mouth daily. For elevated cholesterol.    . BUPROPION HCL ER (XL) 300 MG PO TB24 Oral Take 300 mg by mouth daily. For depression and anxiety.    . QUETIAPINE FUMARATE ER 50 MG PO TB24 Oral Take 150 mg by mouth at bedtime. Take 3 tablets (150 mg total) at bed time    . ZALEPLON 10 MG PO CAPS Oral Take 10 mg by mouth at bedtime.      BP 186/116  Pulse 81  Temp 98.6 F (37 C) (Oral)  Resp 18  SpO2 99%  Physical Exam  Nursing note and vitals reviewed. Constitutional: He is oriented to person, place, and time. He appears well-developed and well-nourished.  HENT:  Head: Normocephalic and atraumatic.  Eyes: EOM are normal.  Neck: Normal range of motion.  Cardiovascular: Normal rate, regular rhythm, normal heart sounds and intact distal pulses.   Pulmonary/Chest: Effort normal and breath sounds normal. No respiratory distress.  Abdominal: Soft. He exhibits no distension. There is no tenderness.  Musculoskeletal: Normal range of motion.  Neurological: He is alert and oriented to person, place, and time.  Skin: Skin is warm and dry.  Psychiatric: He has a normal mood and affect. Judgment normal.    ED Course  Procedures (including critical care time)   Date: 10/27/2011  Rate: 106  Rhythm: sinus tachycardia  QRS Axis: normal  Intervals: normal  ST/T Wave abnormalities: normal  Conduction Disutrbances: none  Narrative Interpretation:   Old EKG Reviewed: No significant changes noted     Labs Reviewed  CBC WITH DIFFERENTIAL - Abnormal; Notable for the following:    HCT 38.6 (*)     RDW 17.1 (*)     Neutrophils Relative 34 (*)     Lymphocytes Relative 55 (*)     All other components within normal limits  COMPREHENSIVE METABOLIC PANEL -  Abnormal; Notable for the following:    Sodium 128 (*)     Chloride 91 (*)     Glucose, Bld 108 (*)     BUN 3 (*)     AST 60 (*)     ALT 60 (*)     Total Bilirubin 0.2 (*)     All other components within normal limits  ETHANOL - Abnormal; Notable for the following:    Alcohol, Ethyl (B) 241 (*)     All other components within normal limits  POCT I-STAT TROPONIN I  TROPONIN I   Dg Chest 2 View  10/27/2011  *RADIOLOGY REPORT*  Clinical Data: Chest pressure  CHEST - 2 VIEW  Comparison: Chest radiograph 82,012  Findings: Normal cardiac silhouette with ectatic aorta.  There is volume loss in the right hemithorax which may relate to prior surgery. Right t posterior rib fractures.  No effusion, infiltrate, or pneumothorax.  Lungs are mildly hyperinflated.  Right shoulder arthroplasty.  IMPRESSION: No acute cardiopulmonary process.   Original Report Authenticated By: Genevive Bi, M.D.     I personally reviewed the imaging tests through PACS system  I reviewed available ER/hospitalization records thought the EMR    1. Chest pain 2. Alcohol abuse   MDM  4:32 PM The patient feels much better at this time.  His blood pressures improved after labetalol.  He's instructed to follow up closely with his primary care physician.  His EKG and troponin x2 are normal.  His pain and discomfort in his chest as been constant for over 10 hours now and now he reports is resolved.  The fact that his had 10 hours of constant pressure in his EKG and both sets of cardiac enzymes are normal x-ray suspect this is very unlikely to be ACS.  The patient will follow up with his cardiologist.  Some of his tachycardia and hypertension on arrival may have been early alcohol withdrawal which may be why he feels slightly better after IV Ativan.  The patient be discharged home with a short course of Ativan to help him finish his self detox at home.  He states he is going to [employer] for which I've  encouraged.        Lyanne Co, MD 10/27/11 [phone]

## 2011-11-08 ENCOUNTER — Other Ambulatory Visit: Payer: Self-pay | Admitting: Physician Assistant

## 2011-11-08 ENCOUNTER — Ambulatory Visit: Payer: Medicare Other | Admitting: Gastroenterology

## 2011-11-20 ENCOUNTER — Other Ambulatory Visit (HOSPITAL_COMMUNITY): Payer: Self-pay | Admitting: Orthopedic Surgery

## 2011-11-20 DIAGNOSIS — M25552 Pain in left hip: Secondary | ICD-10-CM

## 2011-11-29 ENCOUNTER — Encounter (HOSPITAL_COMMUNITY): Payer: Self-pay

## 2011-11-29 ENCOUNTER — Encounter (HOSPITAL_COMMUNITY): Payer: Medicare Other

## 2011-11-29 ENCOUNTER — Ambulatory Visit (HOSPITAL_COMMUNITY): Admission: RE | Admit: 2011-11-29 | Payer: Medicare Other | Source: Ambulatory Visit

## 2011-12-01 ENCOUNTER — Ambulatory Visit (HOSPITAL_COMMUNITY)
Admission: RE | Admit: 2011-12-01 | Discharge: 2011-12-01 | Disposition: A | Discharge status: Home / Self Care | Payer: Medicare Other | Source: Ambulatory Visit | Attending: Orthopedic Surgery | Admitting: Orthopedic Surgery

## 2011-12-01 DIAGNOSIS — M25559 Pain in unspecified hip: Secondary | ICD-10-CM | POA: Insufficient documentation

## 2011-12-01 DIAGNOSIS — M25552 Pain in left hip: Secondary | ICD-10-CM

## 2011-12-08 ENCOUNTER — Other Ambulatory Visit (HOSPITAL_COMMUNITY): Payer: Self-pay | Admitting: Orthopedic Surgery

## 2011-12-08 DIAGNOSIS — M25559 Pain in unspecified hip: Secondary | ICD-10-CM

## 2011-12-08 DIAGNOSIS — M4800 Spinal stenosis, site unspecified: Secondary | ICD-10-CM

## 2011-12-19 ENCOUNTER — Encounter (HOSPITAL_COMMUNITY): Payer: Medicare Other

## 2011-12-19 ENCOUNTER — Inpatient Hospital Stay (HOSPITAL_COMMUNITY): Admission: RE | Admit: 2011-12-19 | Payer: Medicare Other | Source: Ambulatory Visit

## 2011-12-19 ENCOUNTER — Ambulatory Visit (HOSPITAL_COMMUNITY)
Admission: RE | Admit: 2011-12-19 | Discharge: 2011-12-19 | Disposition: A | Discharge status: Home / Self Care | Payer: Medicare Other | Source: Ambulatory Visit | Attending: Orthopedic Surgery | Admitting: Orthopedic Surgery

## 2011-12-19 ENCOUNTER — Ambulatory Visit (HOSPITAL_COMMUNITY)
Admission: RE | Admit: 2011-12-19 | Discharge: 2011-12-19 | Payer: Medicare Other | Source: Ambulatory Visit | Attending: Orthopedic Surgery | Admitting: Orthopedic Surgery

## 2011-12-19 DIAGNOSIS — M47817 Spondylosis without myelopathy or radiculopathy, lumbosacral region: Secondary | ICD-10-CM | POA: Insufficient documentation

## 2011-12-19 DIAGNOSIS — M51379 Other intervertebral disc degeneration, lumbosacral region without mention of lumbar back pain or lower extremity pain: Secondary | ICD-10-CM | POA: Insufficient documentation

## 2011-12-19 DIAGNOSIS — Q619 Cystic kidney disease, unspecified: Secondary | ICD-10-CM | POA: Insufficient documentation

## 2011-12-19 DIAGNOSIS — M5137 Other intervertebral disc degeneration, lumbosacral region: Secondary | ICD-10-CM | POA: Insufficient documentation

## 2011-12-20 ENCOUNTER — Encounter (HOSPITAL_COMMUNITY): Payer: Self-pay

## 2011-12-20 ENCOUNTER — Ambulatory Visit (HOSPITAL_COMMUNITY)
Admission: RE | Admit: 2011-12-20 | Discharge: 2011-12-20 | Disposition: A | Discharge status: Home / Self Care | Payer: Medicare Other | Source: Ambulatory Visit | Attending: Orthopedic Surgery | Admitting: Orthopedic Surgery

## 2011-12-20 ENCOUNTER — Encounter (HOSPITAL_COMMUNITY)
Admission: RE | Admit: 2011-12-20 | Discharge: 2011-12-20 | Disposition: A | Discharge status: Home / Self Care | Payer: Medicare Other | Source: Ambulatory Visit | Attending: Orthopedic Surgery | Admitting: Orthopedic Surgery

## 2011-12-20 ENCOUNTER — Ambulatory Visit (HOSPITAL_COMMUNITY): Payer: Medicare Other

## 2011-12-20 DIAGNOSIS — M25559 Pain in unspecified hip: Secondary | ICD-10-CM | POA: Insufficient documentation

## 2011-12-20 DIAGNOSIS — Z96649 Presence of unspecified artificial hip joint: Secondary | ICD-10-CM | POA: Insufficient documentation

## 2011-12-20 MED ORDER — TECHNETIUM TC 99M MEDRONATE IV KIT
19.2000 | PACK | Freq: Once | INTRAVENOUS | Status: AC | PRN
  Administered 2011-12-20: 19.2 via INTRAVENOUS

## 2011-12-22 ENCOUNTER — Ambulatory Visit (INDEPENDENT_AMBULATORY_CARE_PROVIDER_SITE_OTHER): Payer: Medicare Other | Admitting: Cardiology

## 2011-12-22 ENCOUNTER — Encounter: Payer: Self-pay | Admitting: Cardiology

## 2011-12-22 VITALS — Ht >= 80 in | Wt 188.0 lb

## 2011-12-22 DIAGNOSIS — E785 Hyperlipidemia, unspecified: Secondary | ICD-10-CM

## 2011-12-22 DIAGNOSIS — I2510 Atherosclerotic heart disease of native coronary artery without angina pectoris: Secondary | ICD-10-CM

## 2011-12-22 DIAGNOSIS — I10 Essential (primary) hypertension: Secondary | ICD-10-CM

## 2011-12-22 MED ORDER — ATORVASTATIN CALCIUM 40 MG PO TABS: 40.0000 mg | ORAL_TABLET | Freq: Every day | ORAL | Status: DC

## 2011-12-22 MED ORDER — METOPROLOL SUCCINATE ER 25 MG PO TB24: 25.0000 mg | ORAL_TABLET | Freq: Every day | ORAL | Status: DC

## 2011-12-22 NOTE — Patient Instructions (Signed)
We will schedule you for a nuclear stress test  Continue your current therapy   

## 2011-12-22 NOTE — Progress Notes (Signed)
[name] Date of Birth: 1950-05-31   History of Present Illness: [name] is seen back today for followup today. He has a history of coronary disease and is status post angioplasty of the right coronary in 2007. His last cardiac catheterization was reported in 2010 but I am unable to find the report. He did have an echocardiogram 2 years ago which was unremarkable. He has a history of alcohol and tobacco abuse. He reports that he has been abstinent from alcohol and is attending Alcoholics Anonymous. He has quit smoking over the past 2 months. He is exercising daily and goes to the gym every other day. He is going to need revision of a previous hip replacement and is planning to have this done at the end of January. He denies any chest pain or shortness of breath.  Current Outpatient Prescriptions on File Prior to Visit  Medication Sig Dispense Refill  . buPROPion (WELLBUTRIN XL) 300 MG 24 hr tablet Take 300 mg by mouth daily. For depression and anxiety.      Marland Kitchen LORazepam (ATIVAN) 1 MG tablet Take 1 tablet (1 mg total) by mouth every 6 (six) hours as needed for anxiety (withdrawl symptoms).  10 tablet  0  . QUEtiapine (SEROQUEL XR) 50 MG TB24 Take 150 mg by mouth at bedtime. Take 3 tablets (150 mg total) at bed time      . zaleplon (SONATA) 10 MG capsule Take 10 mg by mouth at bedtime.      . [DISCONTINUED] atorvastatin (LIPITOR) 40 MG tablet Take 40 mg by mouth daily. For elevated cholesterol.        Allergies  Allergen Reactions  . Ambien (Zolpidem Tartrate) Other (See Comments)    Sleep Walking  . Codeine Itching    Past Medical History  Diagnosis Date  . Chest pain   . MVP (mitral valve prolapse)   . Anxiety   . Hypertension   . Coronary artery disease     s/p PCI to RCA in 2007  . Hyperlipidemia   . Palpitations   . Bipolar disorder   . Depression   . Esophagitis   . Gastritis   . Insomnia     Past Surgical History  Procedure Date  . Coronary angioplasty 2007    RCA   . Cardiac catheterization 04/2008    EF 65%  . Joint replacement   . Knee arthroscopy, medial patello femoral ligament repair   . Hip arthroplasty bilateral   . Shoulder arthroscopy right  . Tonsillectomy     History  Smoking status  . Former Smoker  . Quit date: 10/22/2011  Smokeless tobacco  . Not on file    History  Alcohol Use  . 0.0 oz/week  . 12-15 Cans of beer per week    Family History  Problem Relation Age of Onset  . Heart disease Neg Hx   . Prostate cancer Father     Review of Systems: The review of systems is positive for some anxiety.  All other systems were reviewed and are negative.  Physical Exam: Ht 6\' 8"  (2.032 m)  Wt 188 lb (85.276 kg)  BMI 20.65 kg/m2 Patient is a pleasant male in no acute distress. Skin is warm and dry. Color is normal.  HEENT is unremarkable. Normocephalic/atraumatic. PERRL. Sclera are nonicteric. Neck is supple. No masses. No JVD. Lungs are clear. Cardiac exam shows a regular rate and rhythm. Abdomen is soft. No masses or hepatosplenomegaly. Extremities are without edema. Gait  and ROM are intact. No gross neurologic deficits noted.  LABORATORY DATA: ECG today demonstrates normal sinus rhythm with first-degree AV block. It is otherwise normal.  Assessment / Plan: 1. Coronary disease with remote angioplasty of the right coronary. Patient is asymptomatic. It has been at least 3 years since his last ischemic evaluation. Since he is planning to undergo general anesthesia we will schedule him for a lexiscan Myoview study.  2. Hypertension, well controlled.  3. Polysubstance abuse. Patient reports alcohol and tobacco abstinence.  4. Hyperlipidemia. On Crestor.

## 2012-01-02 ENCOUNTER — Ambulatory Visit (HOSPITAL_COMMUNITY): Payer: Medicare Other | Attending: Cardiology | Admitting: Radiology

## 2012-01-02 VITALS — BP 167/106 | HR 71 | Ht 70.0 in | Wt 191.0 lb

## 2012-01-02 DIAGNOSIS — E785 Hyperlipidemia, unspecified: Secondary | ICD-10-CM

## 2012-01-02 DIAGNOSIS — Z01810 Encounter for preprocedural cardiovascular examination: Secondary | ICD-10-CM | POA: Insufficient documentation

## 2012-01-02 DIAGNOSIS — I2510 Atherosclerotic heart disease of native coronary artery without angina pectoris: Secondary | ICD-10-CM

## 2012-01-02 DIAGNOSIS — Z9861 Coronary angioplasty status: Secondary | ICD-10-CM | POA: Insufficient documentation

## 2012-01-02 DIAGNOSIS — Z87891 Personal history of nicotine dependence: Secondary | ICD-10-CM | POA: Insufficient documentation

## 2012-01-02 DIAGNOSIS — I10 Essential (primary) hypertension: Secondary | ICD-10-CM

## 2012-01-02 MED ORDER — TECHNETIUM TC 99M SESTAMIBI GENERIC - CARDIOLITE
11.0000 | Freq: Once | INTRAVENOUS | Status: AC | PRN
  Administered 2012-01-02: 11 via INTRAVENOUS

## 2012-01-02 MED ORDER — REGADENOSON 0.4 MG/5ML IV SOLN
0.4000 mg | Freq: Once | INTRAVENOUS | Status: AC
  Administered 2012-01-02: 0.4 mg via INTRAVENOUS

## 2012-01-02 MED ORDER — TECHNETIUM TC 99M SESTAMIBI GENERIC - CARDIOLITE
32.4000 | Freq: Once | INTRAVENOUS | Status: AC | PRN
  Administered 2012-01-02: 32 via INTRAVENOUS

## 2012-01-02 NOTE — Progress Notes (Signed)
[facility] SITE 3 NUCLEAR MED [id number] [place] [address] [phone]  Cardiology Nuclear Med Study  [name] is a 61 y.o. male     MRN : [id number]     DOB: 1950-03-10  Procedure Date: 01/02/2012  Nuclear Med Background Indication for Stress Test:  Evaluation for Ischemia, PTCA Patency and Clearance for Pending (L) THR in January 2014  History:  '07 GEX:BMWUXLKG ischemia, EF=61%>Cath>PTCA-RCA, EF=65%; '10 Cath:Moderate CAD, cont. medical tx, EF=65%; '11 Echo:EF=60%; MVP Cardiac Risk Factors: History of Smoking and Lipids  Symptoms:  No cardiac complaints.   Nuclear Pre-Procedure Caffeine/Decaff Intake:  None NPO After: 7:30am   Lungs:  Clear. O2 Sat: 100 % on room air. IV 0.9% NS with Angio Cath:  22g  IV Site: L Hand  IV Started by:  Cathlyn Parsons, RN  Chest Size (in):  42 Cup Size: n/a  Height: 5\' 10"  (1.778 m)  Weight:  191 lb (86.637 kg)  BMI:  Body mass index is 27.41 kg/(m^2). Tech Comments:  Toprol taken at 0730    Nuclear Med Study 1 or 2 day study: 1 day  Stress Test Type:  Eugenie Birks  Reading MD: Olga Millers, MD  Order Authorizing Provider:  Peter [place]az[place], MD  Resting Radionuclide: Technetium 52m Sestamibi  Resting Radionuclide Dose: 11.0 mCi   Stress Radionuclide:  Technetium 2m Sestamibi  Stress Radionuclide Dose: 32.4 mCi           Stress Protocol Rest HR: 71 Stress HR: 90  Rest BP: 167/106 Stress BP:154/104  Exercise Time (min): n/a METS: n/a   Predicted Max HR: 160 bpm % Max HR: 56.25 bpm Rate Pressure Product: 40102   Dose of Adenosine (mg):  n/a Dose of Lexiscan: 0.4 mg  Dose of Atropine (mg): n/a Dose of Dobutamine: n/a mcg/kg/min (at max HR)  Stress Test Technologist: Smiley Houseman, CMA-N  Nuclear Technologist:  Doyne Keel, CNMT     Rest Procedure:  Myocardial perfusion imaging was performed at rest 45 minutes following the intravenous administration of Technetium 48m Sestamibi.  Rest  ECG: NSR, RAD, first degree AV block.  Stress Procedure:  The patient received IV Lexiscan 0.4 mg over 15-seconds.  Technetium 83m  Sestamibi was injected at 30-seconds. Quantitative spect images were obtained after a 45 minute delay.  Stress ECG: No significant ST segment change suggestive of ischemia.  QPS Raw Data Images:  Acquisition technically good; normal left ventricular size. Stress Images:  Normal homogeneous uptake in all areas of the myocardium. Rest Images:  Normal homogeneous uptake in all areas of the myocardium. Subtraction (SDS):  No evidence of ischemia. Transient Ischemic Dilatation (Normal <1.22):  0.99 Lung/Heart Ratio (Normal <0.45):  0.30  Quantitative Gated Spect Images QGS EDV:  88 ml QGS ESV:  26 ml  Impression Exercise Capacity:  Lexiscan with no exercise. BP Response:  Normal blood pressure response. Clinical Symptoms:  There is dyspnea. ECG Impression:  No significant ST segment change suggestive of ischemia. Comparison with Prior Nuclear Study: No images to compare  Overall Impression:  Normal stress nuclear study.  LV Ejection Fraction: 71%.  LV Wall Motion:  NL LV Function; NL Wall Motion   Olga Millers

## 2012-01-03 ENCOUNTER — Telehealth: Payer: Self-pay | Admitting: Cardiology

## 2012-01-04 NOTE — Telephone Encounter (Signed)
Patient called myoview results given. 

## 2012-01-04 NOTE — Telephone Encounter (Signed)
Patient called no answer.LMTC. 

## 2012-01-04 NOTE — Telephone Encounter (Signed)
Pt rtn your call

## 2012-01-19 ENCOUNTER — Encounter (HOSPITAL_COMMUNITY): Payer: Self-pay | Admitting: *Deleted

## 2012-01-19 ENCOUNTER — Emergency Department (HOSPITAL_COMMUNITY)
Admission: EM | Admit: 2012-01-19 | Discharge: 2012-01-19 | Disposition: A | Discharge status: Home / Self Care | Payer: Medicare Other | Attending: Emergency Medicine | Admitting: Emergency Medicine

## 2012-01-19 DIAGNOSIS — Z87891 Personal history of nicotine dependence: Secondary | ICD-10-CM | POA: Insufficient documentation

## 2012-01-19 DIAGNOSIS — E785 Hyperlipidemia, unspecified: Secondary | ICD-10-CM | POA: Insufficient documentation

## 2012-01-19 DIAGNOSIS — R569 Unspecified convulsions: Secondary | ICD-10-CM | POA: Insufficient documentation

## 2012-01-19 DIAGNOSIS — F411 Generalized anxiety disorder: Secondary | ICD-10-CM | POA: Insufficient documentation

## 2012-01-19 DIAGNOSIS — I2510 Atherosclerotic heart disease of native coronary artery without angina pectoris: Secondary | ICD-10-CM | POA: Insufficient documentation

## 2012-01-19 DIAGNOSIS — I10 Essential (primary) hypertension: Secondary | ICD-10-CM | POA: Insufficient documentation

## 2012-01-19 DIAGNOSIS — F319 Bipolar disorder, unspecified: Secondary | ICD-10-CM | POA: Insufficient documentation

## 2012-01-19 DIAGNOSIS — Z8719 Personal history of other diseases of the digestive system: Secondary | ICD-10-CM | POA: Insufficient documentation

## 2012-01-19 DIAGNOSIS — Z8679 Personal history of other diseases of the circulatory system: Secondary | ICD-10-CM | POA: Insufficient documentation

## 2012-01-19 DIAGNOSIS — Z79899 Other long term (current) drug therapy: Secondary | ICD-10-CM | POA: Insufficient documentation

## 2012-01-19 LAB — COMPREHENSIVE METABOLIC PANEL
ALT: 14 U/L (ref 0–53)
Albumin: 4.4 g/dL (ref 3.5–5.2)
Alkaline Phosphatase: 76 U/L (ref 39–117)
Calcium: 10 mg/dL (ref 8.4–10.5)
GFR calc Af Amer: 89 mL/min — ABNORMAL LOW (ref >90)
Glucose, Bld: 65 mg/dL — ABNORMAL LOW (ref 70–99)
Potassium: 4.2 mEq/L (ref 3.5–5.1)
Sodium: 135 mEq/L (ref 135–145)
Total Protein: 7.7 g/dL (ref 6.0–8.3)

## 2012-01-19 LAB — CBC
Hemoglobin: 13.3 g/dL (ref 13.0–17.0)
MCH: 26.7 pg (ref 26.0–34.0)
MCHC: 32.9 g/dL (ref 30.0–36.0)
Platelets: 326 10*3/uL (ref 150–400)
RDW: 15.5 % (ref 11.5–15.5)

## 2012-01-19 LAB — GLUCOSE, CAPILLARY
Glucose-Capillary: 62 mg/dL — ABNORMAL LOW (ref 70–99)
Glucose-Capillary: 113 mg/dL — ABNORMAL HIGH (ref 70–99)

## 2012-01-19 MED ORDER — SODIUM CHLORIDE 0.9 % IV BOLUS (SEPSIS)
500.0000 mL | Freq: Once | INTRAVENOUS | Status: AC
  Administered 2012-01-19: 500 mL via INTRAVENOUS

## 2012-01-19 MED ORDER — LORAZEPAM 1 MG PO TABS: 1.0000 mg | ORAL_TABLET | Freq: Three times a day (TID) | ORAL | Status: DC | PRN

## 2012-01-19 MED ORDER — LORAZEPAM 2 MG/ML IJ SOLN
1.0000 mg | Freq: Once | INTRAMUSCULAR | Status: AC
  Administered 2012-01-19: 1 mg via INTRAVENOUS
  Filled 2012-01-19: qty 1

## 2012-01-19 NOTE — ED Notes (Signed)
Patient given 240 ml of diet ginger ale and a [place] sandwich

## 2012-01-19 NOTE — ED Provider Notes (Signed)
History     CSN: [id number]  Arrival date & time 01/19/12  1137   First MD Initiated Contact with Patient 01/19/12 1150      Chief Complaint  Patient presents with  . Seizures    (Consider location/radiation/quality/duration/timing/severity/associated sxs/prior treatment) Patient is a 61 y.o. male presenting with seizures. The history is provided by the patient and the EMS personnel.  Seizures  Pertinent negatives include no confusion, no headaches, no visual disturbance, no chest pain and no cough.  pt arrives via ems from gym for evaluation of possible seizure. Pt was working out, had witness seizures lasting 1-2 minutes, generalized. No incontinence. States had single seizure approximated 2 years ago. States then was under a lot of stress and had run out of his xanax.  States current takes ativan prn, and that he ran out of a week ago. Denies etoh use/abuse, although noted to have hx etoh abuse on review of charts. Denies insomnia. Normal appetite. Denies nvd. States normal stress of holiday season but otherwise denies unusual stressors. No fever or chills. No gu c/o. No recent trauma or fall. No headache. No numbness/weakness. No change in speech or vision. Pt noted to be briefly postictal at scene, alert/oriented, clear sensorium on arrival to ED.      Past Medical History  Diagnosis Date  . Chest pain   . MVP (mitral valve prolapse)   . Anxiety   . Hypertension   . Coronary artery disease     s/p PCI to RCA in 2007  . Hyperlipidemia   . Palpitations   . Bipolar disorder   . Depression   . Esophagitis   . Gastritis   . Insomnia   . Seizures     Past Surgical History  Procedure Date  . Coronary angioplasty 2007    RCA  . Cardiac catheterization 04/2008    EF 65%  . Joint replacement   . Knee arthroscopy, medial patello femoral ligament repair   . Hip arthroplasty bilateral   . Shoulder arthroscopy right  . Tonsillectomy     Family History  Problem Relation  Age of Onset  . Heart disease Neg Hx   . Prostate cancer Father     History  Substance Use Topics  . Smoking status: Former Smoker    Quit date: 10/22/2011  . Smokeless tobacco: Not on file  . Alcohol Use: 0.0 oz/week    12-15 Cans of beer per week      Review of Systems  Constitutional: Negative for fever.  HENT: Negative for neck pain and neck stiffness.   Eyes: Negative for redness and visual disturbance.  Respiratory: Negative for cough and shortness of breath.   Cardiovascular: Negative for chest pain.  Gastrointestinal: Negative for abdominal pain.  Genitourinary: Negative for dysuria and flank pain.  Musculoskeletal: Negative for back pain.  Skin: Negative for rash.  Neurological: Positive for seizures. Negative for headaches.  Hematological: Does not bruise/bleed easily.  Psychiatric/Behavioral: Negative for confusion.    Allergies  Ambien and Codeine  Home Medications   Current Outpatient Rx  Name  Route  Sig  Dispense  Refill  . ATORVASTATIN CALCIUM 40 MG PO TABS   Oral   Take 1 tablet (40 mg total) by mouth daily. For elevated cholesterol.   30 tablet   11   . BUPROPION HCL ER (XL) 300 MG PO TB24   Oral   Take 300 mg by mouth daily. For depression and anxiety.         Marland Kitchen  LORAZEPAM 1 MG PO TABS   Oral   Take 1 tablet (1 mg total) by mouth every 6 (six) hours as needed for anxiety (withdrawl symptoms).   10 tablet   0   . METOPROLOL SUCCINATE ER 25 MG PO TB24   Oral   Take 1 tablet (25 mg total) by mouth daily.   30 tablet   11   . QUETIAPINE FUMARATE ER 50 MG PO TB24   Oral   Take 150 mg by mouth at bedtime. Take 3 tablets (150 mg total) at bed time         . ZALEPLON 10 MG PO CAPS   Oral   Take 10 mg by mouth at bedtime.           BP 127/89  Pulse 108  Temp 98.5 F (36.9 C) (Oral)  Resp 20  SpO2 97%  Physical Exam  Nursing note and vitals reviewed. Constitutional: He is oriented to person, place, and time. He appears  well-developed and well-nourished. No distress.  HENT:  Head: Atraumatic.  Nose: Nose normal.  Mouth/Throat: Oropharynx is clear and moist.       Mild contusion lateral edge tongue.   Eyes: Conjunctivae normal are normal. Pupils are equal, round, and reactive to light.  Neck: Normal range of motion. Neck supple. No tracheal deviation present. No thyromegaly present.  Cardiovascular: Normal rate, regular rhythm, normal heart sounds and intact distal pulses.   Pulmonary/Chest: Effort normal and breath sounds normal. No accessory muscle usage. No respiratory distress. He exhibits no tenderness.  Abdominal: Soft. He exhibits no distension and no mass. There is no tenderness. There is no rebound and no guarding.  Genitourinary:       No cva tenderness  Musculoskeletal: Normal range of motion.       CTLS spine, non tender, aligned, no step off.   Neurological: He is alert and oriented to person, place, and time.       Motor intact bil.  Skin: Skin is warm and dry.  Psychiatric: He has a normal mood and affect.    ED Course  Procedures (including critical care time)  Results for orders placed during the hospital encounter of 01/19/12  GLUCOSE, CAPILLARY      Component Value Range   Glucose-Capillary 62 (*) 70 - 99 mg/dL   Comment 1 Notify RN    CBC      Component Value Range   WBC 5.3  4.0 - 10.5 K/uL   RBC 4.99  4.22 - 5.81 MIL/uL   Hemoglobin 13.3  13.0 - 17.0 g/dL   HCT 60.4  54.0 - 98.1 %   MCV 81.0  78.0 - 100.0 fL   MCH 26.7  26.0 - 34.0 pg   MCHC 32.9  30.0 - 36.0 g/dL   RDW 19.1  47.8 - 29.5 %   Platelets 326  150 - 400 K/uL  COMPREHENSIVE METABOLIC PANEL      Component Value Range   Sodium 135  135 - 145 mEq/L   Potassium 4.2  3.5 - 5.1 mEq/L   Chloride 101  96 - 112 mEq/L   CO2 20  19 - 32 mEq/L   Glucose, Bld 65 (*) 70 - 99 mg/dL   BUN 10  6 - 23 mg/dL   Creatinine, Ser 6.21  0.50 - 1.35 mg/dL   Calcium 30.8  8.4 - 65.7 mg/dL   Total Protein 7.7  6.0 - 8.3 g/dL     Albumin 4.4  3.5 - 5.2 g/dL   AST 17  0 - 37 U/L   ALT 14  0 - 53 U/L   Alkaline Phosphatase 76  39 - 117 U/L   Total Bilirubin 0.2 (*) 0.3 - 1.2 mg/dL   GFR calc non Af Amer 77 (*) >90 mL/min   GFR calc Af Amer 89 (*) >90 mL/min  GLUCOSE, CAPILLARY      Component Value Range   Glucose-Capillary 113 (*) 70 - 99 mg/dL   Comment 1 Notify RN         MDM  Iv ns. Monitor. Pulse ox.  Pt appears mildly anxious, sl shaky.  Ativan 1 mg iv.  Reviewed nursing notes and prior charts for additional history.   ?whether seizure multifactorial related to recent abrupt discontinuation of ativan due to running out, combined w recent stress,  Working out w little po intake this morning and borderline low blood sugar, hx etoh abuse (currently denying), rx w wellbutrin and seroquel, etc.    Date: 01/19/2012  Rate: 108  Rhythm: sinus tachycardia  QRS Axis: normal  Intervals: PR prolonged  ST/T Wave abnormalities: normal  Conduction Disutrbances:first-degree A-V block   Narrative Interpretation:   Old EKG Reviewed: unchanged  Po fluids, meal. Remains asymptomatic. Ambulated in ed.  No tremor or shakes. No recurrent seizures.   Pt requests d/c, states feels fine.         Suzi Roots, MD 01/20/12 [phone]

## 2012-01-19 NOTE — ED Notes (Signed)
Per EMS, pt was at the gym, had a witnessed seizure.  Pt has hx of this.  Last episode was x 2 years ago.  Upon arrival on scene, pt was post-ictal.  Pt is A&Ox 4 at present.  Pt reports joint pain which is chronic.

## 2012-01-19 NOTE — ED Notes (Signed)
[id number]<HK> Expected date:<BR> Expected time:<BR> Means of arrival:<BR> Comments:<BR> Witnessed seizure

## 2012-01-19 NOTE — ED Notes (Signed)
Pt was at the gym during seizure episode, was lowered onto the floor when he started to have an episode.  Pt reports h/a.  No injury noted from the seizure at present.  Pt is A&Ox 4.

## 2012-01-19 NOTE — ED Notes (Signed)
Pt eating and drinking without difficulty.  Pt reports feeling better.

## 2012-01-26 ENCOUNTER — Encounter (HOSPITAL_COMMUNITY): Payer: Self-pay | Admitting: Pharmacy Technician

## 2012-01-31 ENCOUNTER — Other Ambulatory Visit: Payer: Self-pay | Admitting: Orthopedic Surgery

## 2012-02-01 ENCOUNTER — Encounter (HOSPITAL_COMMUNITY)
Admission: RE | Admit: 2012-02-01 | Discharge: 2012-02-01 | Disposition: A | Discharge status: Home / Self Care | Payer: Medicare Other | Source: Ambulatory Visit | Attending: Orthopedic Surgery | Admitting: Orthopedic Surgery

## 2012-02-01 ENCOUNTER — Encounter (HOSPITAL_COMMUNITY): Payer: Self-pay

## 2012-02-01 HISTORY — DX: Pneumonia, unspecified organism: J18.9

## 2012-02-01 HISTORY — DX: Unspecified osteoarthritis, unspecified site: M19.90

## 2012-02-01 LAB — CBC WITH DIFFERENTIAL/PLATELET
Basophils Relative: 1 % (ref 0–1)
HCT: 38.7 % — ABNORMAL LOW (ref 39.0–52.0)
Hemoglobin: 12.3 g/dL — ABNORMAL LOW (ref 13.0–17.0)
MCH: 25.9 pg — ABNORMAL LOW (ref 26.0–34.0)
MCHC: 31.8 g/dL (ref 30.0–36.0)
MCV: 81.5 fL (ref 78.0–100.0)
Monocytes Absolute: 0.6 10*3/uL (ref 0.1–1.0)
Monocytes Relative: 10 % (ref 3–12)
Neutro Abs: 2.4 10*3/uL (ref 1.7–7.7)

## 2012-02-01 LAB — URINALYSIS, ROUTINE W REFLEX MICROSCOPIC
Glucose, UA: NEGATIVE mg/dL
Leukocytes, UA: NEGATIVE
Protein, ur: NEGATIVE mg/dL
Urobilinogen, UA: 1 mg/dL (ref 0.0–1.0)

## 2012-02-01 LAB — BASIC METABOLIC PANEL
BUN: 16 mg/dL (ref 6–23)
CO2: 19 mEq/L (ref 19–32)
Chloride: 103 mEq/L (ref 96–112)
GFR calc Af Amer: >90 mL/min (ref >90)
Glucose, Bld: 83 mg/dL (ref 70–99)
Potassium: 4.1 mEq/L (ref 3.5–5.1)

## 2012-02-01 LAB — TYPE AND SCREEN: ABO/RH(D): O POS

## 2012-02-01 LAB — SURGICAL PCR SCREEN: MRSA, PCR: NEGATIVE

## 2012-02-01 NOTE — Progress Notes (Addendum)
Pt here for PAT.  Cardiologist: Dr. Cyndia Skeeters reports Echo & cardiac clearance given in Nov/Dec 2013 per pt(encounter & ECHO result noted in EPIC). Did not see actual cardiac clearance note from Cardiologist-ECHO normal w/EF-71%.  Chart given to A. Zelenak,PA, to review.  Reports hx of ETOH abuse and having problems w/ narcotics upon discharge.  Surgeon aware according to pt.

## 2012-02-03 NOTE — Consult Note (Signed)
Anesthesiology Chart Review:   62 year old male with CAD (s/p PCI to RCA in 2007), hypertension, remote smoking hx, anxiety, and bipolar disorder scheduled for revision of L. Total Hip Arthroplasty by Dr. Turner Daniels on 02/07/12.   Denies chest pain, works out at [employer] se[employer] Stress test on 01/02/12 was negative for ischemia EF 71%  Labs OK  The anesthesiologist assigned to his case will evaluate him on the morning of surgery.  Kipp Brood, MD

## 2012-02-06 MED ORDER — CEFAZOLIN SODIUM-DEXTROSE 2-3 GM-% IV SOLR
2.0000 g | INTRAVENOUS | Status: AC
  Administered 2012-02-07: 2 g via INTRAVENOUS
  Filled 2012-02-06: qty 50

## 2012-02-06 NOTE — H&P (Signed)
TOTAL HIP REVISION ADMISSION H&P  Patient is admitted for left revision total hip arthroplasty.  Subjective:  Chief Complaint: left hip pain  HPI: [name], 62 y.o. male, has a history of pain and functional disability in the left hip due to arthritis and patient has failed non-surgical conservative treatments for greater than 12 weeks to include NSAID's and/or analgesics and activity modification. The indications for the revision total hip arthroplasty are increasing pain in a recalled device..  Onset of symptoms was gradual starting 1 years ago with gradually worsening course since that time.  Prior procedures on the left hip include arthroplasty.  Patient currently rates pain in the left hip at 7 out of 10 with activity.  There is night pain, worsening of pain with activity and weight bearing and pain that interfers with activities of daily living. Patient has evidence of possible prosthetic loosening by imaging studies.  This condition presents safety issues increasing the risk of fall.  There is no current active infection.  Patient Active Problem List   Diagnosis Date Noted  . Polysubstance abuse 09/02/2011  . Broken internal left hip prosthesis 09/02/2011  . CAD (coronary artery disease) 09/27/2010  . HTN (hypertension) 09/27/2010  . Hyperlipidemia 09/27/2010   Past Medical History  Diagnosis Date  . Chest pain   . MVP (mitral valve prolapse)   . Anxiety   . Hypertension   . Coronary artery disease     s/p PCI to RCA in 2007  . Hyperlipidemia   . Palpitations   . Bipolar disorder   . Depression   . Esophagitis   . Gastritis   . Insomnia   . Seizures   . Pneumonia 2005  . Arthritis     Past Surgical History  Procedure Date  . Coronary angioplasty 2007    RCA  . Cardiac catheterization 04/2008    EF 65%  . Joint replacement   . Knee arthroscopy, medial patello femoral ligament repair   . Hip arthroplasty bilateral   . Shoulder arthroscopy right  . Tonsillectomy       No prescriptions prior to admission   Allergies  Allergen Reactions  . Ambien (Zolpidem Tartrate) Other (See Comments)    Sleep Walking  . Codeine Itching    History  Substance Use Topics  . Smoking status: Former Smoker    Quit date: 10/22/2011  . Smokeless tobacco: Not on file  . Alcohol Use: 0.0 oz/week    12-15 Cans of beer per week    Family History  Problem Relation Age of Onset  . Heart disease Neg Hx   . Prostate cancer Father       Review of Systems  Constitutional: Negative.   HENT: Negative.   Eyes: Negative.   Respiratory: Negative.   Cardiovascular: Negative.   Gastrointestinal: Negative.   Genitourinary: Negative.   Musculoskeletal: Positive for joint pain.  Skin: Negative.   Neurological: Negative.   Endo/Heme/Allergies: Negative.   Psychiatric/Behavioral: Positive for depression. The patient is nervous/anxious.     Objective:  Physical Exam  Constitutional: He is oriented to person, place, and time. He appears well-developed and well-nourished.  HENT:  Head: Normocephalic.  Eyes: Pupils are equal, round, and reactive to light.  Cardiovascular: Normal heart sounds.   Respiratory: Breath sounds normal.  GI: Bowel sounds are normal.  Musculoskeletal: Normal range of motion. He exhibits tenderness.  Neurological: He is alert and oriented to person, place, and time.    Vital signs in last 24  hours:     Labs:   Estimated Body mass index is 27.41 kg/(m^2) as calculated from the following:   Height as of 01/02/12: 5\' 10" (1.778 m).   Weight as of 01/02/12: 191 lb(86.637 kg).  Imaging Review:  Plain radiographs of the left hip demonstrate evidence of possible loosening of the acetabular cup.The bone quality appears to be adequate for age and reported activity level.  Assessment/Plan:  Painful left ASR hip  The patient history, physical examination, clinical judgement of the provider and imaging studies are consistent with end stage  degenerative joint disease of the left hip(s), previous total hip arthroplasty. Revision total hip arthroplasty is deemed medically necessary. The treatment options including medical management, injection therapy, arthroscopy and arthroplasty were discussed at length. The risks and benefits of total hip arthroplasty were presented and reviewed. The risks due to aseptic loosening, infection, stiffness, dislocation/subluxation,  thromboembolic complications and other imponderables were discussed.  The patient acknowledged the explanation, agreed to proceed with the plan and consent was signed. Patient is being admitted for inpatient treatment for surgery, pain control, PT, OT, prophylactic antibiotics, VTE prophylaxis, progressive ambulation and ADL's and discharge planning. The patient is planning to be discharged home with home health services

## 2012-02-07 ENCOUNTER — Encounter (HOSPITAL_COMMUNITY)
Admission: RE | Disposition: A | Discharge status: Home Health | Payer: Self-pay | Source: Ambulatory Visit | Attending: Orthopedic Surgery

## 2012-02-07 ENCOUNTER — Encounter (HOSPITAL_COMMUNITY): Payer: Self-pay | Admitting: Vascular Surgery

## 2012-02-07 ENCOUNTER — Inpatient Hospital Stay (HOSPITAL_COMMUNITY): Payer: Medicare Other

## 2012-02-07 ENCOUNTER — Inpatient Hospital Stay (HOSPITAL_COMMUNITY): Payer: Medicare Other | Admitting: Vascular Surgery

## 2012-02-07 ENCOUNTER — Inpatient Hospital Stay (HOSPITAL_COMMUNITY)
Admission: RE | Admit: 2012-02-07 | Discharge: 2012-02-09 | DRG: 467 | Disposition: A | Discharge status: Home Health | Payer: Medicare Other | Source: Ambulatory Visit | Attending: Orthopedic Surgery | Admitting: Orthopedic Surgery

## 2012-02-07 DIAGNOSIS — Z7982 Long term (current) use of aspirin: Secondary | ICD-10-CM

## 2012-02-07 DIAGNOSIS — I10 Essential (primary) hypertension: Secondary | ICD-10-CM | POA: Diagnosis present

## 2012-02-07 DIAGNOSIS — Z9861 Coronary angioplasty status: Secondary | ICD-10-CM

## 2012-02-07 DIAGNOSIS — Z01812 Encounter for preprocedural laboratory examination: Secondary | ICD-10-CM

## 2012-02-07 DIAGNOSIS — Y92009 Unspecified place in unspecified non-institutional (private) residence as the place of occurrence of the external cause: Secondary | ICD-10-CM

## 2012-02-07 DIAGNOSIS — E785 Hyperlipidemia, unspecified: Secondary | ICD-10-CM | POA: Diagnosis present

## 2012-02-07 DIAGNOSIS — Y831 Surgical operation with implant of artificial internal device as the cause of abnormal reaction of the patient, or of later complication, without mention of misadventure at the time of the procedure: Secondary | ICD-10-CM | POA: Diagnosis present

## 2012-02-07 DIAGNOSIS — T8489XA Other specified complication of internal orthopedic prosthetic devices, implants and grafts, initial encounter: Principal | ICD-10-CM | POA: Diagnosis present

## 2012-02-07 DIAGNOSIS — Z79899 Other long term (current) drug therapy: Secondary | ICD-10-CM

## 2012-02-07 DIAGNOSIS — T8484XA Pain due to internal orthopedic prosthetic devices, implants and grafts, initial encounter: Secondary | ICD-10-CM

## 2012-02-07 DIAGNOSIS — F064 Anxiety disorder due to known physiological condition: Secondary | ICD-10-CM | POA: Diagnosis present

## 2012-02-07 DIAGNOSIS — Z87891 Personal history of nicotine dependence: Secondary | ICD-10-CM

## 2012-02-07 DIAGNOSIS — I2510 Atherosclerotic heart disease of native coronary artery without angina pectoris: Secondary | ICD-10-CM | POA: Diagnosis present

## 2012-02-07 DIAGNOSIS — F319 Bipolar disorder, unspecified: Secondary | ICD-10-CM | POA: Diagnosis present

## 2012-02-07 DIAGNOSIS — D62 Acute posthemorrhagic anemia: Secondary | ICD-10-CM | POA: Diagnosis not present

## 2012-02-07 HISTORY — PX: TOTAL HIP REVISION: SHX763

## 2012-02-07 SURGERY — TOTAL HIP REVISION
Anesthesia: General | Site: Hip | Laterality: Left | Wound class: Clean

## 2012-02-07 MED ORDER — FLEET ENEMA 7-19 GM/118ML RE ENEM: 1.0000 | ENEMA | Freq: Once | RECTAL | Status: AC | PRN

## 2012-02-07 MED ORDER — MIDAZOLAM HCL 2 MG/2ML IJ SOLN
INTRAMUSCULAR | Status: AC
  Filled 2012-02-07: qty 2

## 2012-02-07 MED ORDER — METHOCARBAMOL 100 MG/ML IJ SOLN
500.0000 mg | Freq: Four times a day (QID) | INTRAVENOUS | Status: DC | PRN
  Administered 2012-02-08: 500 mg via INTRAVENOUS

## 2012-02-07 MED ORDER — 0.9 % SODIUM CHLORIDE (POUR BTL) OPTIME
TOPICAL | Status: DC | PRN
  Administered 2012-02-07: 1000 mL

## 2012-02-07 MED ORDER — LACTATED RINGERS IV SOLN
INTRAVENOUS | Status: DC | PRN
  Administered 2012-02-07 (×2): via INTRAVENOUS

## 2012-02-07 MED ORDER — BUPIVACAINE-EPINEPHRINE 0.25% -1:200000 IJ SOLN
INTRAMUSCULAR | Status: AC
  Filled 2012-02-07: qty 1

## 2012-02-07 MED ORDER — ONDANSETRON HCL 4 MG/2ML IJ SOLN: 4.0000 mg | Freq: Four times a day (QID) | INTRAMUSCULAR | Status: DC | PRN

## 2012-02-07 MED ORDER — LORAZEPAM 1 MG PO TABS
1.0000 mg | ORAL_TABLET | Freq: Three times a day (TID) | ORAL | Status: DC | PRN
  Administered 2012-02-08: 1 mg via ORAL
  Filled 2012-02-07: qty 1
  Filled 2012-02-07: qty 2

## 2012-02-07 MED ORDER — GLYCOPYRROLATE 0.2 MG/ML IJ SOLN
INTRAMUSCULAR | Status: DC | PRN
  Administered 2012-02-07: .8 mg via INTRAVENOUS

## 2012-02-07 MED ORDER — CEFUROXIME SODIUM 1.5 G IJ SOLR
INTRAMUSCULAR | Status: AC
  Filled 2012-02-07: qty 1.5

## 2012-02-07 MED ORDER — ACETAMINOPHEN 10 MG/ML IV SOLN
1000.0000 mg | Freq: Four times a day (QID) | INTRAVENOUS | Status: AC
  Administered 2012-02-07 – 2012-02-08 (×4): 1000 mg via INTRAVENOUS
  Filled 2012-02-07 (×4): qty 100

## 2012-02-07 MED ORDER — FENTANYL CITRATE 0.05 MG/ML IJ SOLN
INTRAMUSCULAR | Status: DC | PRN
  Administered 2012-02-07: 50 ug via INTRAVENOUS
  Administered 2012-02-07: 100 ug via INTRAVENOUS
  Administered 2012-02-07 (×2): 50 ug via INTRAVENOUS
  Administered 2012-02-07: 100 ug via INTRAVENOUS

## 2012-02-07 MED ORDER — ACETAMINOPHEN 325 MG PO TABS: 650.0000 mg | ORAL_TABLET | Freq: Four times a day (QID) | ORAL | Status: DC | PRN

## 2012-02-07 MED ORDER — LORAZEPAM 1 MG PO TABS
2.0000 mg | ORAL_TABLET | Freq: Three times a day (TID) | ORAL | Status: DC
  Administered 2012-02-07 – 2012-02-09 (×5): 2 mg via ORAL
  Filled 2012-02-07 (×4): qty 2

## 2012-02-07 MED ORDER — SECOBARBITAL SODIUM 100 MG PO CAPS: 100.0000 mg | ORAL_CAPSULE | Freq: Every day | ORAL | Status: DC

## 2012-02-07 MED ORDER — HYDROMORPHONE HCL PF 1 MG/ML IJ SOLN
INTRAMUSCULAR | Status: AC
  Filled 2012-02-07: qty 1

## 2012-02-07 MED ORDER — HYDROMORPHONE HCL PF 1 MG/ML IJ SOLN
0.2500 mg | INTRAMUSCULAR | Status: DC | PRN
  Administered 2012-02-07: 0.5 mg via INTRAVENOUS

## 2012-02-07 MED ORDER — MENTHOL 3 MG MT LOZG: 1.0000 | LOZENGE | OROMUCOSAL | Status: DC | PRN

## 2012-02-07 MED ORDER — DIPHENHYDRAMINE HCL 12.5 MG/5ML PO ELIX
12.5000 mg | ORAL_SOLUTION | ORAL | Status: DC | PRN
  Administered 2012-02-08: 25 mg via ORAL
  Filled 2012-02-07: qty 10

## 2012-02-07 MED ORDER — NEOSTIGMINE METHYLSULFATE 1 MG/ML IJ SOLN
INTRAMUSCULAR | Status: DC | PRN
  Administered 2012-02-07: 5 mg via INTRAVENOUS

## 2012-02-07 MED ORDER — BISACODYL 5 MG PO TBEC: 5.0000 mg | DELAYED_RELEASE_TABLET | Freq: Every day | ORAL | Status: DC | PRN

## 2012-02-07 MED ORDER — BUPIVACAINE-EPINEPHRINE 0.25% -1:200000 IJ SOLN
INTRAMUSCULAR | Status: DC | PRN
  Administered 2012-02-07: 20 mL

## 2012-02-07 MED ORDER — ATORVASTATIN CALCIUM 40 MG PO TABS
40.0000 mg | ORAL_TABLET | Freq: Every day | ORAL | Status: DC
  Administered 2012-02-08 – 2012-02-09 (×2): 40 mg via ORAL
  Filled 2012-02-07 (×2): qty 1

## 2012-02-07 MED ORDER — METOCLOPRAMIDE HCL 5 MG/ML IJ SOLN
5.0000 mg | Freq: Three times a day (TID) | INTRAMUSCULAR | Status: DC | PRN
Start: 2012-02-07 — End: 2012-02-09

## 2012-02-07 MED ORDER — CELECOXIB 200 MG PO CAPS
200.0000 mg | ORAL_CAPSULE | Freq: Two times a day (BID) | ORAL | Status: DC
  Administered 2012-02-07 – 2012-02-09 (×4): 200 mg via ORAL
  Filled 2012-02-07 (×5): qty 1

## 2012-02-07 MED ORDER — METHOCARBAMOL 100 MG/ML IJ SOLN
500.0000 mg | INTRAVENOUS | Status: DC
  Filled 2012-02-07: qty 5

## 2012-02-07 MED ORDER — ONDANSETRON HCL 4 MG PO TABS: 4.0000 mg | ORAL_TABLET | Freq: Four times a day (QID) | ORAL | Status: DC | PRN

## 2012-02-07 MED ORDER — PHENOL 1.4 % MT LIQD: 1.0000 | OROMUCOSAL | Status: DC | PRN

## 2012-02-07 MED ORDER — METOCLOPRAMIDE HCL 10 MG PO TABS: 5.0000 mg | ORAL_TABLET | Freq: Three times a day (TID) | ORAL | Status: DC | PRN

## 2012-02-07 MED ORDER — OXYCODONE HCL 5 MG PO TABS
5.0000 mg | ORAL_TABLET | ORAL | Status: DC | PRN
  Administered 2012-02-07 – 2012-02-08 (×5): 10 mg via ORAL
  Filled 2012-02-07 (×4): qty 2

## 2012-02-07 MED ORDER — METHOCARBAMOL 500 MG PO TABS
500.0000 mg | ORAL_TABLET | Freq: Four times a day (QID) | ORAL | Status: DC | PRN
  Administered 2012-02-07 – 2012-02-09 (×3): 500 mg via ORAL
  Filled 2012-02-07 (×2): qty 1

## 2012-02-07 MED ORDER — LACTATED RINGERS IV SOLN
INTRAVENOUS | Status: DC
  Administered 2012-02-07: 14:00:00 via INTRAVENOUS

## 2012-02-07 MED ORDER — ROCURONIUM BROMIDE 100 MG/10ML IV SOLN
INTRAVENOUS | Status: DC | PRN
  Administered 2012-02-07: 50 mg via INTRAVENOUS

## 2012-02-07 MED ORDER — METOPROLOL SUCCINATE ER 25 MG PO TB24
25.0000 mg | ORAL_TABLET | Freq: Every day | ORAL | Status: DC
  Administered 2012-02-08 – 2012-02-09 (×2): 25 mg via ORAL
  Filled 2012-02-07 (×2): qty 1

## 2012-02-07 MED ORDER — ALUM & MAG HYDROXIDE-SIMETH 200-200-20 MG/5ML PO SUSP: 30.0000 mL | ORAL | Status: DC | PRN

## 2012-02-07 MED ORDER — ACETAMINOPHEN 650 MG RE SUPP: 650.0000 mg | Freq: Four times a day (QID) | RECTAL | Status: DC | PRN

## 2012-02-07 MED ORDER — ONDANSETRON HCL 4 MG/2ML IJ SOLN
INTRAMUSCULAR | Status: DC | PRN
  Administered 2012-02-07: 4 mg via INTRAVENOUS

## 2012-02-07 MED ORDER — PROPOFOL 10 MG/ML IV BOLUS
INTRAVENOUS | Status: DC | PRN
  Administered 2012-02-07: 160 mg via INTRAVENOUS

## 2012-02-07 MED ORDER — BUPROPION HCL ER (XL) 300 MG PO TB24
300.0000 mg | ORAL_TABLET | Freq: Every day | ORAL | Status: DC
  Administered 2012-02-08 – 2012-02-09 (×2): 300 mg via ORAL
  Filled 2012-02-07 (×2): qty 1

## 2012-02-07 MED ORDER — ASPIRIN EC 325 MG PO TBEC
325.0000 mg | DELAYED_RELEASE_TABLET | Freq: Two times a day (BID) | ORAL | Status: DC
  Administered 2012-02-07 – 2012-02-09 (×4): 325 mg via ORAL
  Filled 2012-02-07 (×5): qty 1

## 2012-02-07 MED ORDER — MIDAZOLAM HCL 5 MG/5ML IJ SOLN
INTRAMUSCULAR | Status: DC | PRN
  Administered 2012-02-07 (×2): 2 mg via INTRAVENOUS

## 2012-02-07 MED ORDER — LIDOCAINE HCL (CARDIAC) 20 MG/ML IV SOLN
INTRAVENOUS | Status: DC | PRN
  Administered 2012-02-07: 80 mg via INTRAVENOUS

## 2012-02-07 MED ORDER — MAGNESIUM HYDROXIDE 400 MG/5ML PO SUSP: 30.0000 mL | Freq: Every day | ORAL | Status: DC | PRN

## 2012-02-07 MED ORDER — KCL IN DEXTROSE-NACL 20-5-0.45 MEQ/L-%-% IV SOLN
INTRAVENOUS | Status: DC
  Administered 2012-02-07: 125 mL/h via INTRAVENOUS
  Filled 2012-02-07 (×8): qty 1000

## 2012-02-07 MED ORDER — HYDROMORPHONE HCL PF 1 MG/ML IJ SOLN
1.0000 mg | INTRAMUSCULAR | Status: DC | PRN
  Administered 2012-02-07 – 2012-02-08 (×3): 1 mg via INTRAVENOUS
  Filled 2012-02-07 (×2): qty 1

## 2012-02-07 MED ORDER — DEXAMETHASONE SODIUM PHOSPHATE 4 MG/ML IJ SOLN
INTRAMUSCULAR | Status: DC | PRN
  Administered 2012-02-07: 8 mg via INTRAVENOUS

## 2012-02-07 MED ORDER — METHOCARBAMOL 500 MG PO TABS
ORAL_TABLET | ORAL | Status: AC
  Filled 2012-02-07: qty 1

## 2012-02-07 MED ORDER — TEMAZEPAM 15 MG PO CAPS
30.0000 mg | ORAL_CAPSULE | Freq: Every evening | ORAL | Status: DC | PRN
  Administered 2012-02-08: 15 mg via ORAL
  Administered 2012-02-08: 30 mg via ORAL
  Filled 2012-02-07: qty 1
  Filled 2012-02-07: qty 2

## 2012-02-07 MED ORDER — OXYCODONE HCL 5 MG PO TABS
ORAL_TABLET | ORAL | Status: AC
  Filled 2012-02-07: qty 2

## 2012-02-07 MED ORDER — QUETIAPINE FUMARATE ER 50 MG PO TB24
150.0000 mg | ORAL_TABLET | Freq: Every day | ORAL | Status: DC
  Administered 2012-02-07: 150 mg via ORAL
  Filled 2012-02-07 (×3): qty 3

## 2012-02-07 SURGICAL SUPPLY — 73 items
ACETAB CUP W/GRIPTION 54 (Plate) ×2 IMPLANT
BOWL SMART MIX CTS (DISPOSABLE) IMPLANT
BRUSH FEMORAL CANAL (MISCELLANEOUS) IMPLANT
CLOTH BEACON ORANGE TIMEOUT ST (SAFETY) ×2 IMPLANT
COVER SURGICAL LIGHT HANDLE (MISCELLANEOUS) ×2 IMPLANT
CUP ACETAB W/GRIPTION 54 (Plate) IMPLANT
DRAPE C-ARM 42X72 X-RAY (DRAPES) IMPLANT
DRAPE ORTHO SPLIT 77X108 STRL (DRAPES) ×2
DRAPE PROXIMA HALF (DRAPES) ×2 IMPLANT
DRAPE SURG ORHT 6 SPLT 77X108 (DRAPES) ×1 IMPLANT
DRAPE U-SHAPE 47X51 STRL (DRAPES) ×2 IMPLANT
DRILL BIT 7/64X5 (BIT) ×2 IMPLANT
DRSG MEPILEX BORDER 4X12 (GAUZE/BANDAGES/DRESSINGS) ×2 IMPLANT
DRSG MEPILEX BORDER 4X8 (GAUZE/BANDAGES/DRESSINGS) ×1 IMPLANT
DURAPREP 26ML APPLICATOR (WOUND CARE) ×3 IMPLANT
ELECT BLADE 4.0 EZ CLEAN MEGAD (MISCELLANEOUS)
ELECT BLADE 6.5 EXT (BLADE) ×1 IMPLANT
ELECT REM PT RETURN 9FT ADLT (ELECTROSURGICAL) ×2
ELECTRODE BLDE 4.0 EZ CLN MEGD (MISCELLANEOUS) IMPLANT
ELECTRODE REM PT RTRN 9FT ADLT (ELECTROSURGICAL) ×1 IMPLANT
EVACUATOR 1/8 PVC DRAIN (DRAIN) IMPLANT
GAUZE XEROFORM 5X9 LF (GAUZE/BANDAGES/DRESSINGS) ×2 IMPLANT
GLOVE BIO SURGEON STRL SZ7 (GLOVE) ×4 IMPLANT
GLOVE BIO SURGEON STRL SZ7.5 (GLOVE) ×2 IMPLANT
GLOVE BIOGEL PI IND STRL 6.5 (GLOVE) IMPLANT
GLOVE BIOGEL PI IND STRL 7.0 (GLOVE) ×1 IMPLANT
GLOVE BIOGEL PI IND STRL 7.5 (GLOVE) IMPLANT
GLOVE BIOGEL PI IND STRL 8 (GLOVE) ×1 IMPLANT
GLOVE BIOGEL PI INDICATOR 6.5 (GLOVE) ×1
GLOVE BIOGEL PI INDICATOR 7.0 (GLOVE) ×2
GLOVE BIOGEL PI INDICATOR 7.5 (GLOVE) ×1
GLOVE BIOGEL PI INDICATOR 8 (GLOVE) ×1
GLOVE SURG SS PI 6.0 STRL IVOR (GLOVE) ×1 IMPLANT
GOWN PREVENTION PLUS XLARGE (GOWN DISPOSABLE) ×6 IMPLANT
GOWN STRL NON-REIN LRG LVL3 (GOWN DISPOSABLE) ×4 IMPLANT
HANDPIECE INTERPULSE COAX TIP (DISPOSABLE)
HEAD M SROM 36MM PLUS (Hips) IMPLANT
HEEL PROTECTOR  874200 (MISCELLANEOUS)
HEEL PROTECTOR 874200 (MISCELLANEOUS) IMPLANT
HOOD PEEL AWAY FACE SHEILD DIS (HOOD) ×5 IMPLANT
KIT BASIN OR (CUSTOM PROCEDURE TRAY) ×2 IMPLANT
KIT ROOM TURNOVER OR (KITS) ×2 IMPLANT
LINER MARATHON 10D 36X54 (Hips) IMPLANT
LINER MARATHON 10DEG 36X54 (Hips) ×2 IMPLANT
MANIFOLD NEPTUNE II (INSTRUMENTS) ×2 IMPLANT
NEEDLE 22X1 1/2 (OR ONLY) (NEEDLE) ×2 IMPLANT
NOZZLE PRISM 8.5MM (MISCELLANEOUS) IMPLANT
NS IRRIG 1000ML POUR BTL (IV SOLUTION) ×4 IMPLANT
PACK TOTAL JOINT (CUSTOM PROCEDURE TRAY) ×2 IMPLANT
PAD ARMBOARD 7.5X6 YLW CONV (MISCELLANEOUS) ×4 IMPLANT
PASSER SUT SWANSON 36MM LOOP (INSTRUMENTS) IMPLANT
PRESSURIZER FEMORAL UNIV (MISCELLANEOUS) IMPLANT
SCREW 6.5MMX40MM (Screw) ×1 IMPLANT
SET HNDPC FAN SPRY TIP SCT (DISPOSABLE) IMPLANT
SLEEVE SURGEON STRL (DRAPES) IMPLANT
SPONGE GAUZE 4X4 12PLY (GAUZE/BANDAGES/DRESSINGS) ×1 IMPLANT
SPONGE LAP 18X18 X RAY DECT (DISPOSABLE) IMPLANT
SROM M HEAD 36MM PLUS (Hips) ×2 IMPLANT
STAPLER VISISTAT 35W (STAPLE) ×2 IMPLANT
SUT ETHIBOND 2 V 37 (SUTURE) ×2 IMPLANT
SUT ETHILON 3 0 FSL (SUTURE) ×4 IMPLANT
SUT VIC AB 0 CTB1 27 (SUTURE) ×2 IMPLANT
SUT VIC AB 1 CTX 36 (SUTURE) ×2
SUT VIC AB 1 CTX36XBRD ANBCTR (SUTURE) ×1 IMPLANT
SUT VIC AB 2-0 CTB1 (SUTURE) ×2 IMPLANT
SYR 20ML ECCENTRIC (SYRINGE) ×2 IMPLANT
SYR CONTROL 10ML LL (SYRINGE) ×2 IMPLANT
TOWEL OR 17X24 6PK STRL BLUE (TOWEL DISPOSABLE) ×2 IMPLANT
TOWEL OR 17X26 10 PK STRL BLUE (TOWEL DISPOSABLE) ×2 IMPLANT
TOWER CARTRIDGE SMART MIX (DISPOSABLE) IMPLANT
TRAY FOLEY CATH 14FR (SET/KITS/TRAYS/PACK) IMPLANT
TUBE ANAEROBIC SPECIMEN COL (MISCELLANEOUS) ×2 IMPLANT
WATER STERILE IRR 1000ML POUR (IV SOLUTION) ×6 IMPLANT

## 2012-02-07 NOTE — Anesthesia Postprocedure Evaluation (Signed)
Anesthesia Post Note  Patient: [name]  Procedure(s) Performed: Procedure(s) (LRB): TOTAL HIP REVISION (Left)  Anesthesia type: General  Patient location: PACU  Post pain: Pain level controlled and Adequate analgesia  Post assessment: Post-op Vital signs reviewed, Patient's Cardiovascular Status Stable, Respiratory Function Stable, Patent Airway and Pain level controlled  Last Vitals:  Filed Vitals:   02/07/12 1732  BP:   Pulse: 52  Temp:   Resp: 14    Post vital signs: Reviewed and stable  Level of consciousness: awake, alert  and oriented  Complications: No apparent anesthesia complications

## 2012-02-07 NOTE — Preoperative (Signed)
Beta Blockers   Reason not to administer Beta Blockers:Not Applicable 

## 2012-02-07 NOTE — Transfer of Care (Signed)
Immediate Anesthesia Transfer of Care Note  Patient: [name]  Procedure(s) Performed: Procedure(s) (LRB) with comments: TOTAL HIP REVISION (Left) - [place] Hip Revision Set  Patient Location: PACU  Anesthesia Type:General  Level of Consciousness: awake, alert  and oriented  Airway & Oxygen Therapy: Patient Spontanous Breathing and Patient connected to nasal cannula oxygen  Post-op Assessment: Report given to PACU RN, Post -op Vital signs reviewed and stable, Patient moving all extremities X 4 and Patient able to stick tongue midline  Post vital signs: Reviewed and stable  Complications: No apparent anesthesia complications

## 2012-02-07 NOTE — Interval H&P Note (Signed)
History and Physical Interval Note:  02/07/2012 1:53 PM  [name]  has presented today for surgery, with the diagnosis of PAINFUL LEFT ASR TOTAL HIP ARTHROPLASTY  The various methods of treatment have been discussed with the patient and family. After consideration of risks, benefits and other options for treatment, the patient has consented to  Procedure(s) (LRB) with comments: TOTAL HIP REVISION (Left) - [place] Hip Revision Set as a surgical intervention .  The patient's history has been reviewed, patient examined, no change in status, stable for surgery.  I have reviewed the patient's chart and labs.  Questions were answered to the patient's satisfaction.     [name]

## 2012-02-07 NOTE — Op Note (Signed)
Preop diagnosis: Painful ASR on SROM L total hip  Postoperative diagnosis: Same  Procedure: Revision right total hip arthroplasty with removal of ASR cup and femoral head and revision to a 54 mm Gryption cup 10 polyethylene liner index superior and a +0 36 mm metal head.  Surgeon: Feliberto Gottron. Turner Daniels M.D.  Assistant: Shirl Harris PA-C  Estimated blood loss: 400 cc  Fluid replacement: 1800 cc of crystalloid  Complications: None  Indications: Patient with an ASR on S-ROM total hip that did very well until a 8 months ago when he had increasing groin pain. The pain wakes him up at night and recently got to the point where he could no longer go walking on a regular basis. MRI scan showed minimalfluid collection, but no bony destruction. Plain x-rays show no change in the position of the components and the stem appears to be well ingrown. Risks and benefits of revision surgery have been discussed and questions answered.  Procedure: Patient was identified by arm band receive preoperative IV antibiotics in the holding area at, and hospital. He was then taken to the operating room where the appropriate anesthetic monitors were attached and general endotracheal anesthesia induced with the patient in the supine position. He was then rolled into the R lateral decubitus position and fixed there with a mark 2 pelvic clamp. A Foley catheter was inserted and the limb prepped and draped in usual sterile fashion from the ankle to the hemipelvis. Time out procedure performed. Skin along the lateral hip and thigh infiltrated with 10 cc of 1/2% Marcaine and epinephrine solution. We began the operation by recreating the old posterior lateral incision 15 cm in line through the skin and subcutaneous tissue down to the level of the IT band which was cut in line with the skin incision. All normal-appearing joint fluid was encountered when we penetrated the pseudocapsule. We then remove scar tissue from around to the ASR  cup and femoral stem trunnion, dislocated a total hip and removed the ASR head with a mallet and metal cylinder. The trunnion was then tucked anterior and superior to the acetabulum and we continued to remove scar tissue from around the acetabular component. A posterior inferior wing retractor was hammered into place. And we began loosening the cup by placing a 1/4 inch osteotome between the edge of the acetabular component and the bone. We then used the short Innomed curved osteotome around the edge of the cup and at that point it came out with moderate effort and bone ingrowth over about 25% of the metal.We reamed up to a 53 mm basket reamer obtaining good coverage in all quadrants irrigated with normal saline solution. We then hammered into place a 54 mm Gryption sector cup in 45 of abduction and 20 of anteversion. A single 45 mm dome screw was then inserted providing firm supplemental fixation. A 10 trial liner index superior was hammered into place. A trial reduction was then performed with a +0 36 mm femoral head instability was noted to 90 of flexion 70 of internal rotation and in full extension the hip could not be dislocated with external rotation. The trial liner was removed, a real 10 polyethylene liner was hammered into place index straight superior. At this point a real +0 36 mm metal head was hammered into place, the hip reduced and irrigated with normal saline solution. The capsular flap was repaired back to the intertrochanteric crest through drill holes with #2 Ethibond suture. We then closed the IT band with  running #1 Vicryl suture, the subcutaneous tissue with 0 and 2-0 undyed Vicryl suture, the skin was closed with running interlocking 3-0 nylon suture. A dressing of Mepilex was then applied, the patient was unclamped a rolled supine awakened extubated and taken to the recovery without difficulty.

## 2012-02-07 NOTE — Anesthesia Preprocedure Evaluation (Signed)
Anesthesia Evaluation  Patient identified by MRN, date of birth, ID band Patient awake    Reviewed: Allergy & Precautions, H&P , NPO status , Patient's Chart, lab work & pertinent test results  Airway Mallampati: II  Neck ROM: full    Dental   Pulmonary          Cardiovascular hypertension, + CAD     Neuro/Psych Seizures -,  Anxiety Depression    GI/Hepatic   Endo/Other    Renal/GU      Musculoskeletal   Abdominal   Peds  Hematology   Anesthesia Other Findings   Reproductive/Obstetrics                           Anesthesia Physical Anesthesia Plan  ASA: III  Anesthesia Plan: General   Post-op Pain Management:    Induction: Intravenous  Airway Management Planned: Oral ETT  Additional Equipment:   Intra-op Plan:   Post-operative Plan: Extubation in OR  Informed Consent: I have reviewed the patients History and Physical, chart, labs and discussed the procedure including the risks, benefits and alternatives for the proposed anesthesia with the patient or authorized representative who has indicated his/her understanding and acceptance.     Plan Discussed with: CRNA and Surgeon  Anesthesia Plan Comments:         Anesthesia Quick Evaluation

## 2012-02-07 NOTE — OR Nursing (Signed)
Dr Chaney Malling notified of bp's 160's/ 90's-100's as compared to admisiion bp----elects "no treatment" at this time.

## 2012-02-08 LAB — BASIC METABOLIC PANEL
BUN: 9 mg/dL (ref 6–23)
Calcium: 9.1 mg/dL (ref 8.4–10.5)
Creatinine, Ser: 0.9 mg/dL (ref 0.50–1.35)
GFR calc Af Amer: >90 mL/min (ref >90)
GFR calc non Af Amer: >90 mL/min (ref >90)

## 2012-02-08 LAB — CBC
HCT: 29.2 % — ABNORMAL LOW (ref 39.0–52.0)
MCHC: 31.8 g/dL (ref 30.0–36.0)
MCV: 82.7 fL (ref 78.0–100.0)
Platelets: 236 10*3/uL (ref 150–400)
RDW: 15.3 % (ref 11.5–15.5)

## 2012-02-08 NOTE — Progress Notes (Signed)
CARE MANAGEMENT NOTE 02/08/2012  Patient:  [name], [name]   Account Number:  [id number]  Date Initiated:  02/08/2012  Documentation initiated by:  Vance Peper  Subjective/Objective Assessment:   62 yr old male s/p left hip revision     Action/Plan:   CM spoke with patient concerning home health and DME needs. Patient preoperatively setup with [facility], no changes.   Anticipated DC Date:  02/09/2012   Anticipated DC Plan:  HOME W HOME HEALTH SERVICES      DC Planning Services  CM consult      [facility] Choice  HOME HEALTH   Choice offered to / List presented to:  C-1 Patient   DME arranged  3-N-1  Levan Hurst      DME agency  [facility]     [facility] arranged  HH-1 RN  HH-2 PT  HH-3 OT      [facility] agency  [facility]   Status of service:  Completed, signed off Medicare Important Message given?   (If response is "NO", the following Medicare IM given date fields will be blank) Date Medicare IM given:   Date Additional Medicare IM given:    Discharge Disposition:  HOME W HOME HEALTH SERVICES  Per UR Regulation:    If discussed at Long Length of Stay Meetings, dates discussed:    Comments:

## 2012-02-08 NOTE — Progress Notes (Signed)
Pt pulled off    l eft hip dsy entirely.Discussed keeping dsy on for infection prevention,and care of surgical wound pt stated " Oh I didn't pull that off, im just sleep deprived" Sutures intact but oozing serous drainage noted from 2 areas of left hip incision  wound. Clean 4x4's,tegaderm, and mepilex applied. Linward Headland D

## 2012-02-08 NOTE — Progress Notes (Signed)
Patient ID: [name], male   DOB: 1950/12/04, 62 y.o.   MRN: [id number] PATIENT ID: [name]  MRN: [id number]  DOB/AGE:  09-12-1950 / 62 y.o.  1 Day Post-Op Procedure(s) (LRB): TOTAL HIP REVISION (Left), removal of the metal-on-metal ASR bearing and revision to standard polyethylene on metal bearing    PROGRESS NOTE Subjective: Patient is alert, oriented,no Nausea, no Vomiting, yes passing gas, no Bowel Movement. Taking PO well. Denies SOB, Chest or Calf Pain. Using Incentive Spirometer, PAS in place. Ambulate weight bearing as tolerated, posterior precautions Patient reports pain as 1 on 0-10 scale  .    Objective: Vital signs in last 24 hours: Filed Vitals:   02/08/12 0000 02/08/12 0154 02/08/12 0400 02/08/12 0600  BP:  137/84  123/68  Pulse:  78  75  Temp:  97.7 F (36.5 C)  97.5 F (36.4 C)  TempSrc:      Resp: 20 18 18 18   SpO2:  99% 96% 100%      Intake/Output from previous day: I/O last 3 completed shifts: In: 1200 [I.V.:1200] Out: 2400 [Urine:2250; Blood:150]   Intake/Output this shift:     LABORATORY DATA:  Basename 02/08/12 0535  WBC 14.8*  HGB 9.3*  HCT 29.2*  PLT 236  NA 131*  K 4.5  CL 96  CO2 23  BUN 9  CREATININE 0.90  GLUCOSE 157*  GLUCAP --  INR --  CALCIUM 9.1    Examination: Neurologically intact ABD soft Neurovascular intact Sensation intact distally Intact pulses distally Dorsiflexion/Plantar flexion intact Incision: no drainage No cellulitis present Compartment soft} XR AP&Lat of hip shows well placed\fixed THA  Assessment:   1 Day Post-Op Procedure(s) (LRB): TOTAL HIP REVISION (Left) ADDITIONAL DIAGNOSIS:  Hypertension and History of coronary artery disease  Plan: PT/OT WBAT, THA  posterior precautions  DVT Prophylaxis: SCDx72 hrs, ASA 325 mg BID x 2 weeks  DISCHARGE PLAN: Home  DISCHARGE NEEDS: HHPT, HHRN, Walker and 3-in-1 comode seat

## 2012-02-08 NOTE — Evaluation (Signed)
Physical Therapy Evaluation Patient Details Name: [name] MRN: [id number] DOB: [birth year] Today's Date: 02/08/2012 Time: 1015-1028 PT Time Calculation (min): 13 min  PT Assessment / Plan / Recommendation Clinical Impression  Pt is a 62 y/o male s/p revision of recalled THA. Pt doing very well with mobility and should be appropriate to d/c to home when cleared by MD>    PT Assessment  Patient needs continued PT services    Follow Up Recommendations  No PT follow up    Does the patient have the potential to tolerate intense rehabilitation      Barriers to Discharge None      Equipment Recommendations  None recommended by PT    Recommendations for Other Services     Frequency 7X/week    Precautions / Restrictions Precautions Precautions: Posterior Hip Precaution Comments: Reviewed precautions with pt.   Restrictions Weight Bearing Restrictions: Yes LLE Weight Bearing: Weight bearing as tolerated   Pertinent Vitals/Pain 5/10 pain in hip.  Pt medicated prior to session.       Mobility  Bed Mobility Bed Mobility: Supine to Sit Supine to Sit: 6: Modified independent (Device/Increase time) Transfers Transfers: Sit to Stand;Stand to Sit Sit to Stand: 5: Supervision Stand to Sit: 5: Supervision Details for Transfer Assistance: supervision for safety Ambulation/Gait Ambulation/Gait Assistance: 5: Supervision Ambulation Distance (Feet): 100 Feet Assistive device: Rolling walker Ambulation/Gait Assistance Details: supervision for safety Gait Pattern: Step-to pattern    Shoulder Instructions     Exercises Total Joint Exercises Ankle Circles/Pumps: 10 reps;Both Gluteal Sets: 10 reps;Both   PT Diagnosis: Abnormality of gait  PT Problem List: Decreased mobility;Decreased knowledge of use of DME PT Treatment Interventions: Gait training;Functional mobility training;Therapeutic activities;Therapeutic exercise   PT Goals Acute Rehab PT Goals PT Goal Formulation:  With patient Time For Goal Achievement: 02/15/12 Potential to Achieve Goals: Good Pt will Ambulate: >150 feet;with modified independence;with least restrictive assistive device PT Goal: Ambulate - Progress: Goal set today Pt will Go Up / Down Stairs: 3-5 stairs;with least restrictive assistive device;with modified independence PT Goal: Up/Down Stairs - Progress: Goal set today Pt will Perform Home Exercise Program: Independently PT Goal: Perform Home Exercise Program - Progress: Goal set today  Visit Information  Last PT Received On: 02/08/12 Assistance Needed: +1    Subjective Data  Subjective: Agree to PT eval   Prior Functioning  Home Living Lives With: Significant other Prior Function Level of Independence: Independent Able to Take Stairs?: Yes Driving: Yes Communication Communication: No difficulties    Cognition  Overall Cognitive Status: Appears within functional limits for tasks assessed/performed Arousal/Alertness: Awake/alert Orientation Level: Appears intact for tasks assessed Behavior During Session: [facility] for tasks performed    Extremity/Trunk Assessment Right Upper Extremity Assessment RUE ROM/Strength/Tone: Within functional levels Left Upper Extremity Assessment LUE ROM/Strength/Tone: Within functional levels Right Lower Extremity Assessment RLE ROM/Strength/Tone: Within functional levels Left Lower Extremity Assessment LLE ROM/Strength/Tone: WFL for tasks assessed   Balance    End of Session PT - End of Session Equipment Utilized During Treatment: Gait belt Activity Tolerance: Patient tolerated treatment well Patient left: in chair;with call bell/phone within reach Nurse Communication: Mobility status  GP     Jabrea Kallstrom 02/08/2012, 3:49 PM Jillienne Egner L. Tyshika Baldridge DPT [phone]

## 2012-02-08 NOTE — Progress Notes (Signed)
Pt has not slept,restless,squirmy,,serous drainage from left  hip wound,pt cont to pull off reinforced dsy and bleed onto sheets,pads etc.prns given but pt unable to calm self,stated " I have lots of worries on my mind".High fall risk prev measures in place.see safety doc flowsheet. Linward Headland D

## 2012-02-08 NOTE — Clinical Documentation Improvement (Signed)
Anemia Documentation Clarification Query  THIS DOCUMENT IS NOT A PERMANENT PART OF THE MEDICAL RECORD  RESPOND TO THE THIS QUERY, FOLLOW THE INSTRUCTIONS BELOW:  1. If needed, update documentation for the patient's encounter via the notes activity.  2. Access this query again and click edit on the In [employer].  3. After updating, or not, click F2 to complete all highlighted (required) fields concerning your review. Select "additional documentation in the medical record" OR "no additional documentation provided".  4. Click Sign note button.  5. The deficiency will fall out of your In Basket *Please let us know if you are not able to complete this workflow by phone or e-mail (listed below).          02/08/12  Dear [name] [name]  In an effort to better capture your patient's severity of illness, reflect appropriate length of stay and utilization of resources, a review of the patient medical record has revealed the following indicators.    Based on your clinical judgment, please clarify and document in a progress note and/or discharge summary the clinical condition associated with the following supporting information:  In responding to this query please exercise your independent judgment.  The fact that a query is asked, does not imply that any particular answer is desired or expected.  Patient had a drop in hct from 40.4 to 29.2 1 day post op.  Patient continues on IVF @ 125 ml and CBC have been ordered daily.  If possible please document medical condition if appropriate.  Thank you  Possible Clinical Conditions?  " Iron deficiency anemia  " Anemia due to chronic disease  " Expected post operative Acute Blood Loss Anemia  " Precipitous drop in Hematocrit  " Other Condition  " Cannot Clinically Determine   Supporting Information: Total Hip Revision  ebl 400 cc   Signs and Symptoms: N/A  Diagnostics:  Admission H&H:  12.3 / 38.7    Current H&H:  9.3 /29.2   IV fluids : D 5 1/2 NS w 20 meq KCL  @ 125 ml /hr  Serial H&H monitoring: CBC daily   You may use possible, probable, or suspect with inpatient documentation. Possible, probable, suspected diagnoses MUST be documented at the time of discharge.  Reviewed: additional documentation in the medical record  Thank You,  Lavonda Jumbo  Clinical Documentation Specialist, BSN: Pager [phone]  Health Information Management [place]

## 2012-02-08 NOTE — Progress Notes (Signed)
UR COMPLETED  

## 2012-02-09 ENCOUNTER — Encounter (HOSPITAL_COMMUNITY): Payer: Self-pay | Admitting: Orthopedic Surgery

## 2012-02-09 LAB — CBC
MCH: 26.6 pg (ref 26.0–34.0)
MCHC: 32.7 g/dL (ref 30.0–36.0)
MCV: 81.5 fL (ref 78.0–100.0)
Platelets: 178 10*3/uL (ref 150–400)
RDW: 15.5 % (ref 11.5–15.5)

## 2012-02-09 MED ORDER — LORAZEPAM 1 MG PO TABS: 2.0000 mg | ORAL_TABLET | Freq: Three times a day (TID) | ORAL | Status: DC | PRN

## 2012-02-09 MED ORDER — OXYCODONE-ACETAMINOPHEN 5-325 MG PO TABS: 1.0000 | ORAL_TABLET | ORAL | Status: DC | PRN

## 2012-02-09 MED ORDER — METHOCARBAMOL 500 MG PO TABS: 500.0000 mg | ORAL_TABLET | Freq: Four times a day (QID) | ORAL | Status: DC | PRN

## 2012-02-09 MED ORDER — TEMAZEPAM 30 MG PO CAPS: 30.0000 mg | ORAL_CAPSULE | Freq: Every evening | ORAL | Status: DC | PRN

## 2012-02-09 MED ORDER — ASPIRIN 325 MG PO TBEC: 325.0000 mg | DELAYED_RELEASE_TABLET | Freq: Two times a day (BID) | ORAL | Status: DC

## 2012-02-09 NOTE — Progress Notes (Signed)
Order received, chart reviewed, in to talk to patient about role of OT. Pt reports this is his 3rd hip surgery and feels that he can manage all of his BADLs with him and his partners help. Knows about AE and may get it at [facility]. Has all DME needed per pt. No OT needs identified, will sign off.

## 2012-02-09 NOTE — Discharge Summary (Signed)
Patient ID: [name] MRN: [id number] DOB/AGE: [birth year] 62 y.o.  Admit date: 02/07/2012 Discharge date: 02/09/2012  Admission Diagnoses:  Principal Problem:  *Pain due to hip joint prosthesis, recalled Depuy ASR   Discharge Diagnoses:  Same  Past Medical History  Diagnosis Date  . Chest pain   . MVP (mitral valve prolapse)   . Anxiety   . Hypertension   . Coronary artery disease     s/p PCI to RCA in 2007  . Hyperlipidemia   . Palpitations   . Bipolar disorder   . Depression   . Esophagitis   . Gastritis   . Insomnia   . Seizures   . Pneumonia 2005  . Arthritis     Surgeries: Procedure(s): TOTAL HIP REVISION on 02/07/2012   Consultants:    Discharged Condition: Improved  Hospital Course: YIDA HYAMS is an 62 y.o. male who was admitted 02/07/2012 for operative treatment ofPain due to hip joint prosthesis. Patient has severe unremitting pain that affects sleep, daily activities, and work/hobbies. After pre-op clearance the patient was taken to the operating room on 02/07/2012 and underwent  Procedure(s): TOTAL HIP REVISION.    Patient was given perioperative antibiotics: Anti-infectives     Start     Dose/Rate Route Frequency Ordered Stop   02/07/12 0600   ceFAZolin (ANCEF) IVPB 2 g/50 mL premix        2 g 100 mL/hr over 30 Minutes Intravenous On call to O.R. 02/06/12 1425 02/07/12 1450           Patient was given sequential compression devices, early ambulation, and chemoprophylaxis to prevent DVT.  Patient benefited maximally from hospital stay and there were no complications.    Recent vital signs: Patient Vitals for the past 24 hrs:  BP Temp Temp src Pulse Resp SpO2  02/09/12 0643 113/68 mmHg 98.9 F (37.2 C) Oral 79  16  99 %  02/09/12 0400 - - - - 16  -  02/09/12 0000 - - - - 16  -  02/08/12 2112 118/69 mmHg 97.9 F (36.6 C) Oral 81  18  99 %  02/08/12 1413 145/88 mmHg 98.2 F (36.8 C) - 80  18  99 %     Recent laboratory studies:  Basename  02/09/12 0540 02/08/12 0535  WBC 5.5 14.8*  HGB 8.2* 9.3*  HCT 25.1* 29.2*  PLT 178 236  NA -- 131*  K -- 4.5  CL -- 96  CO2 -- 23  BUN -- 9  CREATININE -- 0.90  GLUCOSE -- 157*  INR -- --  CALCIUM -- 9.1     Discharge Medications:     Medication List     As of 02/09/2012 10:05 AM    STOP taking these medications         aspirin 325 MG tablet      ibuprofen 200 MG tablet   Commonly known as: ADVIL,MOTRIN      TAKE these medications         aspirin 325 MG EC tablet   Take 1 tablet (325 mg total) by mouth 2 (two) times daily.      atorvastatin 40 MG tablet   Commonly known as: LIPITOR   Take 40 mg by mouth daily.      buPROPion 300 MG 24 hr tablet   Commonly known as: WELLBUTRIN XL   Take 300 mg by mouth daily.      LORazepam 1 MG tablet   Commonly known as:  ATIVAN   Take 2 tablets (2 mg total) by mouth 3 (three) times daily as needed.      LORazepam 1 MG tablet   Commonly known as: ATIVAN   Take 2 mg by mouth 3 (three) times daily.      methocarbamol 500 MG tablet   Commonly known as: ROBAXIN   Take 1 tablet (500 mg total) by mouth every 6 (six) hours as needed.      metoprolol succinate 25 MG 24 hr tablet   Commonly known as: TOPROL-XL   Take 25 mg by mouth daily.      oxyCODONE-acetaminophen 5-325 MG per tablet   Commonly known as: PERCOCET/ROXICET   Take 1-2 tablets by mouth every 4 (four) hours as needed for pain.      secobarbital 100 MG capsule   Commonly known as: SECONAL   Take 100 mg by mouth at bedtime.      SEROQUEL XR 50 MG Tb24   Generic drug: QUEtiapine   Take 150 mg by mouth at bedtime. Take 3 tablets (150 mg total) at bed time      temazepam 30 MG capsule   Commonly known as: RESTORIL   Take 1 capsule (30 mg total) by mouth at bedtime as needed for sleep.        Diagnostic Studies: Dg Pelvis Portable  02/07/2012  *RADIOLOGY REPORT*  Clinical Data: Postop left hip repair.  PORTABLE PELVIS  Comparison: None.  Findings: The  patient is status post left hip arthroplasty. Femoral stem is well seated.  Subcutaneous and joint air are noted. Right hip arthroplasty is unremarkable.  IMPRESSION: Left hip arthroplasty with expected immediate postoperative findings.   Original Report Authenticated By: Leanna Battles, M.D.    Dg Hip Portable 1 View Left  02/07/2012  *RADIOLOGY REPORT*  Clinical Data: Postop left hip arthroplasty.  PORTABLE LEFT HIP - 1 VIEW  Comparison: None.  Findings: Single cross-table lateral view of the left hip is submitted.  Femoral stem of a left hip arthroplasty appears well seated.  Subcutaneous air is noted.  IMPRESSION: Left hip arthroplasty with expected postoperative findings.   Original Report Authenticated By: Leanna Battles, M.D.     Disposition: 01-Home or Self Care      Discharge Orders    Future Orders Please Complete By Expires   Increase activity slowly      Walker       May shower / Bathe      Driving Restrictions      Comments:   No driving for 2 weeks.   Change dressing (specify)      Comments:   Dressing change as needed.   Call MD for:  temperature >100.4      Call MD for:  severe uncontrolled pain      Call MD for:  redness, tenderness, or signs of infection (pain, swelling, redness, odor or green/yellow discharge around incision site)      Discharge instructions      Comments:   F/U as scheduled with Dr. Turner Daniels (POD #[id number])         Signed: Hazle Nordmann. 02/09/2012, 10:05 AM

## 2012-02-09 NOTE — Progress Notes (Signed)
PATIENT ID: [name]  MRN: [id number]  DOB/AGE:  1950-09-18 / 62 y.o.  2 Days Post-Op Procedure(s) (LRB): TOTAL HIP REVISION (Left)    PROGRESS NOTE Subjective: Patient is alert, oriented,no Nausea, no Vomiting, yes passing gas, no Bowel Movement. Taking PO well. Denies SOB, Chest or Calf Pain. Using Incentive Spirometer, PAS in place. Ambulating well with PT. Patient reports pain as moderate  .    Objective: Vital signs in last 24 hours: Filed Vitals:   02/08/12 2112 02/09/12 0000 02/09/12 0400 02/09/12 0643  BP: 118/69   113/68  Pulse: 81   79  Temp: 97.9 F (36.6 C)   98.9 F (37.2 C)  TempSrc: Oral   Oral  Resp: 18 16 16 16   SpO2: 99%   99%      Intake/Output from previous day: I/O last 3 completed shifts: In: 960 [P.O.:960] Out: 2250 [Urine:2250]   Intake/Output this shift:     LABORATORY DATA:  Basename 02/09/12 0540 02/08/12 0535  WBC 5.5 14.8*  HGB 8.2* 9.3*  HCT 25.1* 29.2*  PLT 178 236  NA -- 131*  K -- 4.5  CL -- 96  CO2 -- 23  BUN -- 9  CREATININE -- 0.90  GLUCOSE -- 157*  GLUCAP -- --  INR -- --  CALCIUM -- 9.1    Examination: Neurologically intact ABD soft Neurovascular intact Sensation intact distally Intact pulses distally Dorsiflexion/Plantar flexion intact Incision: dressing C/D/I} XR AP&Lat of hip shows well placed\fixed THA  Assessment:   2 Days Post-Op Procedure(s) (LRB): TOTAL HIP REVISION (Left) ADDITIONAL DIAGNOSIS:  Acute Blood Loss Anemia - asymptomatic  Plan: PT/OT WBAT, THA  posterior precautions  DVT Prophylaxis: SCDx72 hrs, ASA 325 mg BID x 2 weeks  DISCHARGE PLAN: Home  DISCHARGE NEEDS: HHPT, HHRN, Walker and 3-in-1 comode seat

## 2012-02-09 NOTE — Progress Notes (Signed)
Patient's IV was removed due to malposition. Notified IV team of a restart. IV nurse unable to get access after 2 attempts. IV team offered to get another one of her team members to try and start one. Patient refused to have this done. Told IV nurse "that the IV had been acting up all day and they can try to start one in the morning". IV nurse notified myself of the conversation.

## 2012-02-12 DIAGNOSIS — D62 Acute posthemorrhagic anemia: Secondary | ICD-10-CM | POA: Diagnosis not present

## 2012-03-19 ENCOUNTER — Observation Stay (HOSPITAL_COMMUNITY)
Admission: EM | Admit: 2012-03-19 | Discharge: 2012-03-22 | Payer: Medicare Other | Attending: Internal Medicine | Admitting: Internal Medicine

## 2012-03-19 ENCOUNTER — Encounter (HOSPITAL_COMMUNITY): Payer: Self-pay | Admitting: Emergency Medicine

## 2012-03-19 DIAGNOSIS — Z951 Presence of aortocoronary bypass graft: Secondary | ICD-10-CM | POA: Insufficient documentation

## 2012-03-19 DIAGNOSIS — M129 Arthropathy, unspecified: Secondary | ICD-10-CM | POA: Insufficient documentation

## 2012-03-19 DIAGNOSIS — G4700 Insomnia, unspecified: Secondary | ICD-10-CM | POA: Insufficient documentation

## 2012-03-19 DIAGNOSIS — I059 Rheumatic mitral valve disease, unspecified: Secondary | ICD-10-CM | POA: Insufficient documentation

## 2012-03-19 DIAGNOSIS — F319 Bipolar disorder, unspecified: Secondary | ICD-10-CM | POA: Insufficient documentation

## 2012-03-19 DIAGNOSIS — F1910 Other psychoactive substance abuse, uncomplicated: Secondary | ICD-10-CM | POA: Diagnosis present

## 2012-03-19 DIAGNOSIS — I10 Essential (primary) hypertension: Secondary | ICD-10-CM | POA: Insufficient documentation

## 2012-03-19 DIAGNOSIS — Z96649 Presence of unspecified artificial hip joint: Secondary | ICD-10-CM | POA: Insufficient documentation

## 2012-03-19 DIAGNOSIS — F17200 Nicotine dependence, unspecified, uncomplicated: Secondary | ICD-10-CM | POA: Insufficient documentation

## 2012-03-19 DIAGNOSIS — D62 Acute posthemorrhagic anemia: Secondary | ICD-10-CM

## 2012-03-19 DIAGNOSIS — R079 Chest pain, unspecified: Principal | ICD-10-CM | POA: Insufficient documentation

## 2012-03-19 DIAGNOSIS — F411 Generalized anxiety disorder: Secondary | ICD-10-CM | POA: Insufficient documentation

## 2012-03-19 DIAGNOSIS — E785 Hyperlipidemia, unspecified: Secondary | ICD-10-CM | POA: Insufficient documentation

## 2012-03-19 DIAGNOSIS — F1110 Opioid abuse, uncomplicated: Secondary | ICD-10-CM | POA: Insufficient documentation

## 2012-03-19 DIAGNOSIS — D649 Anemia, unspecified: Secondary | ICD-10-CM | POA: Diagnosis present

## 2012-03-19 DIAGNOSIS — I2510 Atherosclerotic heart disease of native coronary artery without angina pectoris: Secondary | ICD-10-CM | POA: Insufficient documentation

## 2012-03-19 DIAGNOSIS — F1020 Alcohol dependence, uncomplicated: Secondary | ICD-10-CM | POA: Insufficient documentation

## 2012-03-19 LAB — POCT I-STAT, CHEM 8
Calcium, Ion: 1.18 mmol/L (ref 1.13–1.30)
Creatinine, Ser: 0.7 mg/dL (ref 0.50–1.35)
Glucose, Bld: 93 mg/dL (ref 70–99)
Hemoglobin: 13.6 g/dL (ref 13.0–17.0)
Potassium: 3.5 mEq/L (ref 3.5–5.1)

## 2012-03-19 LAB — PRO B NATRIURETIC PEPTIDE: Pro B Natriuretic peptide (BNP): 58.3 pg/mL (ref 0–125)

## 2012-03-19 MED ORDER — ASPIRIN 81 MG PO CHEW
324.0000 mg | CHEWABLE_TABLET | Freq: Once | ORAL | Status: AC
  Administered 2012-03-19: 324 mg via ORAL
  Filled 2012-03-19: qty 4

## 2012-03-19 MED ORDER — NITROGLYCERIN 2 % TD OINT
0.5000 [in_us] | TOPICAL_OINTMENT | Freq: Four times a day (QID) | TRANSDERMAL | Status: DC
  Administered 2012-03-19 – 2012-03-21 (×7): 0.5 [in_us] via TOPICAL
  Filled 2012-03-19 (×9): qty 30
  Filled 2012-03-19: qty 1
  Filled 2012-03-19 (×20): qty 30

## 2012-03-19 NOTE — ED Provider Notes (Signed)
History    CSN: [id number] Arrival date & time 03/19/12  2159 First MD Initiated Contact with Patient 03/19/12 2300     Chief Complaint  Patient presents with  . Chest Pain   HPI Pt states he started having chest pain today at 3pm.  He had a heaviness in his chest.  It lasts for 30 minutes at a time although has been there most of the evening.  Pt states the main reason he came in though is because he wants detox.  Pt is taking percocet, xanax, robaxin and alcohol.  He feels like he is having stress about this and it is contributing to his chest pain.  Her has been crying a lot today.  No fevers, no vomiting but he has been nauseated.  Some loose stools for the last three days.  He has history of CAD and these symptoms do not feel quite the same or as severe.  He had a recent lexiscan myoview 2 months ago in December that was normal.  Past Medical History  Diagnosis Date  . Chest pain   . MVP (mitral valve prolapse)   . Anxiety   . Hypertension   . Coronary artery disease     s/p PCI to RCA in 2007  . Hyperlipidemia   . Palpitations   . Bipolar disorder   . Depression   . Esophagitis   . Gastritis   . Insomnia   . Seizures   . Pneumonia 2005  . Arthritis     Past Surgical History  Procedure Laterality Date  . Coronary angioplasty  2007    RCA  . Cardiac catheterization  04/2008    EF 65%  . Joint replacement    . Knee arthroscopy, medial patello femoral ligament repair    . Hip arthroplasty  bilateral   . Shoulder arthroscopy  right  . Tonsillectomy    . Total hip revision  02/07/2012    Procedure: TOTAL HIP REVISION;  Surgeon: [name], MD;  Location: [facility] OR;  Service: Orthopedics;  Laterality: Left;  [place] Hip Revision Set    Family History  Problem Relation Age of Onset  . Heart disease Neg Hx   . Prostate cancer Father     History  Substance Use Topics  . Smoking status: Former Smoker    Quit date: 10/22/2011  . Smokeless tobacco: Not on file  .  Alcohol Use: 0.0 oz/week    12-15 Cans of beer per week      Review of Systems  Psychiatric/Behavioral: Negative for suicidal ideas and self-injury.  All other systems reviewed and are negative.    Allergies  Ambien and Codeine  Home Medications   Current Outpatient Rx  Name  Route  Sig  Dispense  Refill  . aspirin EC 325 MG EC tablet   Oral   Take 1 tablet (325 mg total) by mouth 2 (two) times daily.   30 tablet   0   . atorvastatin (LIPITOR) 40 MG tablet   Oral   Take 40 mg by mouth daily.         Marland Kitchen buPROPion (WELLBUTRIN XL) 300 MG 24 hr tablet   Oral   Take 300 mg by mouth daily.          . metoprolol succinate (TOPROL-XL) 25 MG 24 hr tablet   Oral   Take 25 mg by mouth daily.         . nicotine (NICODERM CQ - DOSED IN MG/24  HR) 7 mg/24hr patch   Transdermal   Place 1 patch onto the skin daily.         . QUEtiapine (SEROQUEL XR) 50 MG TB24   Oral   Take 150 mg by mouth at bedtime. Take 3 tablets (150 mg total) at bed time         . secobarbital (SECONAL) 100 MG capsule   Oral   Take 100 mg by mouth at bedtime.         . temazepam (RESTORIL) 30 MG capsule   Oral   Take 1 capsule (30 mg total) by mouth at bedtime as needed for sleep.   30 capsule   0   . aspirin 81 MG tablet   Oral   Take 81 mg by mouth daily.           BP 159/112  Pulse 99  Temp(Src) 98.8 F (37.1 C) (Oral)  Resp 24  SpO2 100%  Physical Exam  Nursing note and vitals reviewed. Constitutional: He appears well-developed and well-nourished. No distress.  HENT:  Head: Normocephalic and atraumatic.  Right Ear: External ear normal.  Left Ear: External ear normal.  Eyes: Conjunctivae are normal. Right eye exhibits no discharge. Left eye exhibits no discharge. No scleral icterus.  Neck: Neck supple. No tracheal deviation present.  Cardiovascular: Normal rate, regular rhythm and intact distal pulses.   Pulmonary/Chest: Effort normal and breath sounds normal. No  stridor. No respiratory distress. He has no wheezes. He has no rales.  Abdominal: Soft. Bowel sounds are normal. He exhibits no distension. There is no tenderness. There is no rebound and no guarding.  Musculoskeletal: He exhibits no edema and no tenderness.  Neurological: He is alert. He has normal strength. No sensory deficit. Cranial nerve deficit:  no gross defecits noted. He exhibits normal muscle tone. He displays no seizure activity. Coordination normal.  Skin: Skin is warm and dry. No rash noted.  Psychiatric: His speech is not rapid and/or pressured. He exhibits a depressed mood. He expresses no homicidal and no suicidal ideation.    ED Course  Procedures (including critical care time)  Rate: 97  Rhythm: normal sinus rhythm  QRS Axis: normal  Intervals: normal  ST/T Wave abnormalities: normal  Conduction Disutrbances:none  Narrative Interpretation: nl, poor data quality  Old EKG Reviewed: none available  Labs Reviewed  CBC WITH DIFFERENTIAL - Abnormal; Notable for the following:    Hemoglobin 11.7 (*)    HCT 36.2 (*)    MCH 25.5 (*)    RDW 17.4 (*)    Platelets 484 (*)    All other components within normal limits  LIPASE, BLOOD - Abnormal; Notable for the following:    Lipase 102 (*)    All other components within normal limits  HEPATIC FUNCTION PANEL - Abnormal; Notable for the following:    Total Bilirubin 0.2 (*)    Indirect Bilirubin 0.1 (*)    All other components within normal limits  ETHANOL - Abnormal; Notable for the following:    Alcohol, Ethyl (B) 58 (*)    All other components within normal limits  POCT I-STAT, CHEM 8 - Abnormal; Notable for the following:    BUN <3 (*)    All other components within normal limits  PRO B NATRIURETIC PEPTIDE  PROTIME-INR  DRUG SCREEN PANEL (SERUM)  POCT I-STAT TROPONIN I   Dg Chest 2 View  03/20/2012  *RADIOLOGY REPORT*  Clinical Data: Chest pain  CHEST - 2 VIEW  Comparison: 10/27/2011 and prior chest radiographs   Findings: The cardiomediastinal silhouette is unremarkable. The lungs are clear. There is no evidence of focal airspace disease, pulmonary edema, suspicious pulmonary nodule/mass, pleural effusion, or pneumothorax. No acute bony abnormalities are identified. A remote upper thoracic compression fracture is again noted.  IMPRESSION: No evidence of acute cardiopulmonary disease.   Original Report Authenticated By: Harmon Pier, M.D.     MDM  Patient has history of coronary artery disease and presents with complaints of chest pain associated with substance abuse and withdrawal.  Patient is requesting detox. The patient does have alcohol in his system and has elevated lipase. This may be contributing to some of his symptoms. I reviewed his records and patient has a history of having recurrent episodes of substance abuse.  Considering his comorbidities, I do not feel that he stable for psychiatric admission at this time. I will consult the medical service for observation, serial rule out and and treatment of alcohol withdrawal        Celene Kras, MD 03/20/12 [phone]

## 2012-03-19 NOTE — ED Notes (Signed)
Patient with chest pain and shortness of breath, started earlier today around 3pm.  Patient does have some nausea with the pain.

## 2012-03-19 NOTE — ED Notes (Signed)
IV team paged and returned phone call to come to ED and place IV.

## 2012-03-19 NOTE — ED Notes (Signed)
Two failed IV attempts made.  One in right ac, one in right hand.  Both dressed and not bleeding noted following pressure.

## 2012-03-20 ENCOUNTER — Emergency Department (HOSPITAL_COMMUNITY): Payer: Medicare Other

## 2012-03-20 ENCOUNTER — Encounter (HOSPITAL_COMMUNITY): Payer: Self-pay | Admitting: Cardiology

## 2012-03-20 DIAGNOSIS — R079 Chest pain, unspecified: Secondary | ICD-10-CM | POA: Diagnosis present

## 2012-03-20 DIAGNOSIS — R072 Precordial pain: Secondary | ICD-10-CM | POA: Diagnosis present

## 2012-03-20 LAB — HEPATIC FUNCTION PANEL
ALT: 15 U/L (ref 0–53)
AST: 21 U/L (ref 0–37)
Bilirubin, Direct: 0.1 mg/dL (ref 0.0–0.3)
Total Bilirubin: 0.2 mg/dL — ABNORMAL LOW (ref 0.3–1.2)

## 2012-03-20 LAB — HEMOGLOBIN A1C: Hgb A1c MFr Bld: 5.6 % (ref <5.7)

## 2012-03-20 LAB — CBC WITH DIFFERENTIAL/PLATELET
Basophils Absolute: 0 10*3/uL (ref 0.0–0.1)
Eosinophils Relative: 1 % (ref 0–5)
HCT: 36.2 % — ABNORMAL LOW (ref 39.0–52.0)
Lymphocytes Relative: 32 % (ref 12–46)
Lymphs Abs: 2.6 10*3/uL (ref 0.7–4.0)
MCV: 79 fL (ref 78.0–100.0)
Monocytes Absolute: 0.7 10*3/uL (ref 0.1–1.0)
RDW: 17.4 % — ABNORMAL HIGH (ref 11.5–15.5)
WBC: 8.2 10*3/uL (ref 4.0–10.5)

## 2012-03-20 LAB — BASIC METABOLIC PANEL
BUN: 6 mg/dL (ref 6–23)
Creatinine, Ser: 0.68 mg/dL (ref 0.50–1.35)
GFR calc non Af Amer: >90 mL/min (ref >90)
Glucose, Bld: 99 mg/dL (ref 70–99)
Potassium: 3.2 mEq/L — ABNORMAL LOW (ref 3.5–5.1)

## 2012-03-20 LAB — CBC
HCT: 33.9 % — ABNORMAL LOW (ref 39.0–52.0)
Hemoglobin: 10.9 g/dL — ABNORMAL LOW (ref 13.0–17.0)
MCH: 25.5 pg — ABNORMAL LOW (ref 26.0–34.0)
MCHC: 32.2 g/dL (ref 30.0–36.0)
RDW: 17.3 % — ABNORMAL HIGH (ref 11.5–15.5)

## 2012-03-20 LAB — TROPONIN I: Troponin I: <0.3 ng/mL (ref <0.30)

## 2012-03-20 MED ORDER — SECOBARBITAL SODIUM 100 MG PO CAPS: 100.0000 mg | ORAL_CAPSULE | Freq: Every day | ORAL | Status: DC

## 2012-03-20 MED ORDER — ATORVASTATIN CALCIUM 40 MG PO TABS
40.0000 mg | ORAL_TABLET | Freq: Every day | ORAL | Status: DC
  Administered 2012-03-20 – 2012-03-22 (×3): 40 mg via ORAL
  Filled 2012-03-20 (×3): qty 1

## 2012-03-20 MED ORDER — NICOTINE 7 MG/24HR TD PT24
7.0000 mg | MEDICATED_PATCH | TRANSDERMAL | Status: DC
  Administered 2012-03-20 – 2012-03-22 (×4): 7 mg via TRANSDERMAL
  Filled 2012-03-20 (×4): qty 1

## 2012-03-20 MED ORDER — ADULT MULTIVITAMIN W/MINERALS CH
1.0000 | ORAL_TABLET | Freq: Every day | ORAL | Status: DC
  Administered 2012-03-20 – 2012-03-22 (×3): 1 via ORAL
  Filled 2012-03-20 (×3): qty 1

## 2012-03-20 MED ORDER — BUPROPION HCL ER (XL) 300 MG PO TB24
300.0000 mg | ORAL_TABLET | Freq: Every day | ORAL | Status: DC
  Administered 2012-03-20 – 2012-03-22 (×3): 300 mg via ORAL
  Filled 2012-03-20 (×3): qty 1

## 2012-03-20 MED ORDER — ONDANSETRON HCL 4 MG/2ML IJ SOLN
4.0000 mg | Freq: Four times a day (QID) | INTRAMUSCULAR | Status: DC | PRN
  Administered 2012-03-20: 4 mg via INTRAVENOUS

## 2012-03-20 MED ORDER — TEMAZEPAM 15 MG PO CAPS
30.0000 mg | ORAL_CAPSULE | Freq: Every evening | ORAL | Status: DC | PRN
  Administered 2012-03-21: 30 mg via ORAL
  Filled 2012-03-20: qty 2

## 2012-03-20 MED ORDER — LORAZEPAM 2 MG/ML IJ SOLN
1.0000 mg | Freq: Once | INTRAMUSCULAR | Status: AC
  Administered 2012-03-20: 1 mg via INTRAVENOUS
  Filled 2012-03-20: qty 1

## 2012-03-20 MED ORDER — LORAZEPAM 1 MG PO TABS
1.0000 mg | ORAL_TABLET | Freq: Four times a day (QID) | ORAL | Status: DC | PRN
  Administered 2012-03-21 (×2): 1 mg via ORAL
  Filled 2012-03-20 (×2): qty 1

## 2012-03-20 MED ORDER — LORAZEPAM 2 MG/ML IJ SOLN
1.0000 mg | Freq: Four times a day (QID) | INTRAMUSCULAR | Status: DC | PRN
  Filled 2012-03-20: qty 1

## 2012-03-20 MED ORDER — QUETIAPINE FUMARATE ER 50 MG PO TB24
150.0000 mg | ORAL_TABLET | Freq: Every day | ORAL | Status: DC
  Administered 2012-03-20 – 2012-03-21 (×2): 150 mg via ORAL
  Filled 2012-03-20 (×3): qty 3

## 2012-03-20 MED ORDER — SODIUM CHLORIDE 0.9 % IJ SOLN
3.0000 mL | Freq: Two times a day (BID) | INTRAMUSCULAR | Status: DC
  Administered 2012-03-20 – 2012-03-22 (×4): 3 mL via INTRAVENOUS

## 2012-03-20 MED ORDER — ONDANSETRON HCL 4 MG PO TABS: 4.0000 mg | ORAL_TABLET | Freq: Four times a day (QID) | ORAL | Status: DC | PRN

## 2012-03-20 MED ORDER — FOLIC ACID 1 MG PO TABS
1.0000 mg | ORAL_TABLET | Freq: Every day | ORAL | Status: DC
  Administered 2012-03-20 – 2012-03-22 (×3): 1 mg via ORAL
  Filled 2012-03-20 (×3): qty 1

## 2012-03-20 MED ORDER — THIAMINE HCL 100 MG/ML IJ SOLN
100.0000 mg | Freq: Every day | INTRAMUSCULAR | Status: DC
  Filled 2012-03-20 (×2): qty 1

## 2012-03-20 MED ORDER — ONDANSETRON HCL 4 MG/2ML IJ SOLN
INTRAMUSCULAR | Status: AC
  Administered 2012-03-20: 4 mg via INTRAVENOUS
  Filled 2012-03-20: qty 2

## 2012-03-20 MED ORDER — LORAZEPAM 2 MG/ML IJ SOLN
1.0000 mg | INTRAMUSCULAR | Status: DC | PRN
  Administered 2012-03-20 (×2): 1 mg via INTRAVENOUS
  Filled 2012-03-20: qty 1

## 2012-03-20 MED ORDER — HYDROCODONE-ACETAMINOPHEN 5-325 MG PO TABS
1.0000 | ORAL_TABLET | ORAL | Status: DC | PRN
  Administered 2012-03-20: 2 via ORAL
  Administered 2012-03-20: 1 via ORAL
  Filled 2012-03-20: qty 1
  Filled 2012-03-20: qty 2

## 2012-03-20 MED ORDER — SODIUM CHLORIDE 0.9 % IV SOLN
INTRAVENOUS | Status: AC
  Administered 2012-03-20: 05:00:00 via INTRAVENOUS

## 2012-03-20 MED ORDER — METOPROLOL SUCCINATE ER 25 MG PO TB24
25.0000 mg | ORAL_TABLET | Freq: Every day | ORAL | Status: DC
  Administered 2012-03-20 – 2012-03-22 (×3): 25 mg via ORAL
  Filled 2012-03-20 (×3): qty 1

## 2012-03-20 MED ORDER — ASPIRIN 81 MG PO CHEW
81.0000 mg | CHEWABLE_TABLET | Freq: Every day | ORAL | Status: DC
  Administered 2012-03-20 – 2012-03-22 (×3): 81 mg via ORAL
  Filled 2012-03-20 (×3): qty 1

## 2012-03-20 MED ORDER — VITAMIN B-1 100 MG PO TABS
100.0000 mg | ORAL_TABLET | Freq: Every day | ORAL | Status: DC
  Administered 2012-03-20 – 2012-03-22 (×3): 100 mg via ORAL
  Filled 2012-03-20 (×3): qty 1

## 2012-03-20 MED ORDER — ENOXAPARIN SODIUM 40 MG/0.4ML ~~LOC~~ SOLN
40.0000 mg | SUBCUTANEOUS | Status: DC
  Administered 2012-03-20 – 2012-03-22 (×3): 40 mg via SUBCUTANEOUS
  Filled 2012-03-20 (×3): qty 0.4

## 2012-03-20 MED ORDER — HYDROCODONE-ACETAMINOPHEN 5-325 MG PO TABS: 1.0000 | ORAL_TABLET | Freq: Four times a day (QID) | ORAL | Status: DC | PRN

## 2012-03-20 NOTE — Consult Note (Signed)
CARDIOLOGY CONSULT NOTE  Patient ID: [name] MRN: [id number] DOB/AGE: [birth year] 62 y.o.  Admit date: 03/19/2012 Primary Physician  Primary Cardiologist Dr. Sw[place]il[place] Chief Complaint   Chest pain  HPI:  The patient presented last night to the ER with chest pain.  He has has had substance abuse and was seeking detox.  He does have a history of CAD with PCI to the RCA in 2007.  He was last seen by Dr. Swaziland in the office in November for routine follow up.  Lexiscan Myoview at that time demonstrated no ischemia or infarct and a normal EF.   Following his last presentation to Dr. Swaziland he had that surgery. He did well with this and was recovering but has now become although narcotics, has been drinking alcohol and smoking cigarettes. He wants to come off of this and wants rehabilitation. He presented to the emergency room he was describing chest discomfort. He said he been having this for a couple of days on and off. However, on the day of admission it was severe. He said his head he. It is similar to the discomfort he had at his last cath in 2010 when he apparently had nonobstructive coronary disease. He did not describe associated symptoms. He's not had any nausea vomiting or diaphoresis. He's had a little trouble getting a deep breath and is coughing when he deep breathes which is blaming on the cigarettes. This admission enzymes have been negative.  EKG has been nonacute. He had mild discomfort this morning but currently is pain-free. PBNP was normal.    Past Medical History  Diagnosis Date  . Chest pain   . MVP (mitral valve prolapse)   . Anxiety   . Hypertension   . Coronary artery disease     s/p PCI to RCA in 2007  . Hyperlipidemia   . Palpitations   . Bipolar disorder   . Depression   . Esophagitis   . Gastritis   . Insomnia   . Seizures   . Pneumonia 2005  . Arthritis     Past Surgical History  Procedure Laterality Date  . Coronary angioplasty  2007    RCA  . Cardiac  catheterization  04/2008    EF 65%  . Joint replacement    . Knee arthroscopy, medial patello femoral ligament repair    . Hip arthroplasty  bilateral   . Shoulder arthroscopy  right  . Tonsillectomy    . Total hip revision  02/07/2012    Procedure: TOTAL HIP REVISION;  Surgeon: [name], MD;  Location: [facility] OR;  Service: Orthopedics;  Laterality: Left;  [place] Hip Revision Set    Allergies  Allergen Reactions  . Ambien (Zolpidem Tartrate) Other (See Comments)    Sleep Walking  . Codeine Itching   Prescriptions prior to admission  Medication Sig Dispense Refill  . aspirin EC 325 MG tablet Take 325 mg by mouth every morning.      Marland Kitchen atorvastatin (LIPITOR) 40 MG tablet Take 40 mg by mouth daily.      Marland Kitchen buPROPion (WELLBUTRIN XL) 300 MG 24 hr tablet Take 300 mg by mouth daily.       Marland Kitchen ibuprofen (ADVIL,MOTRIN) 200 MG tablet Take 800 mg by mouth every 8 (eight) hours as needed for pain.      . metoprolol succinate (TOPROL-XL) 25 MG 24 hr tablet Take 25 mg by mouth daily.      . QUEtiapine (SEROQUEL XR) 50 MG TB24 Take 150  mg by mouth at bedtime. Take 3 tablets (150 mg total) at bed time      . QUEtiapine (SEROQUEL XR) 50 MG TB24 Take 50 mg by mouth daily as needed (stress).       Family History  Problem Relation Age of Onset  . Heart disease Neg Hx   . Prostate cancer Father     History   Social History  . Marital Status: Single    Spouse Name: N/A    Number of Children: N/A  . Years of Education: N/A   Occupational History  . Not on file.   Social History Main Topics  . Smoking status: Former Smoker    Quit date: 10/22/2011  . Smokeless tobacco: Not on file  . Alcohol Use: 0.0 oz/week    12-15 Cans of beer per week  . Drug Use: 7.00 per week    Special: Benzodiazepines     Comment: xanex  . Sexually Active: Yes   Other Topics Concern  . Not on file   Social History Narrative  . No narrative on file     ROS:  As stated in the HPI and negative for all other  systems.  Physical Exam: Blood pressure 116/81, pulse 99, temperature 97.9 F (36.6 C), temperature source Oral, resp. rate 19, height 5\' 10"  (1.778 m), weight 180 lb 12.4 oz (82 kg), SpO2 98.00%.  GENERAL:  Well appearing HEENT:  Pupils equal round and reactive, fundi not visualized, oral mucosa unremarkable NECK:  No jugular venous distention, waveform within normal limits, carotid upstroke brisk and symmetric, no bruits, no thyromegaly LYMPHATICS:  No cervical, inguinal adenopathy LUNGS:  Clear to auscultation bilaterally BACK:  No CVA tenderness CHEST:  Unremarkable HEART:  PMI not displaced or sustained,S1 and S2 within normal limits, no S3, no S4, no clicks, no rubs, no murmurs ABD:  Flat, positive bowel sounds normal in frequency in pitch, no bruits, no rebound, no guarding, no midline pulsatile mass, no hepatomegaly, no splenomegaly EXT:  2 plus pulses throughout, no edema, no cyanosis no clubbing SKIN:  No rashes no nodules NEURO:  Cranial nerves II through XII grossly intact, motor grossly intact throughout PSYCH:  Cognitively intact, oriented to person place and time   Labs: Lab Results  Component Value Date   BUN 6 03/20/2012   Lab Results  Component Value Date   CREATININE 0.68 03/20/2012   Lab Results  Component Value Date   NA 134* 03/20/2012   K 3.2* 03/20/2012   CL 99 03/20/2012   CO2 22 03/20/2012   Lab Results  Component Value Date   TROPONINI <0.30 03/20/2012   Lab Results  Component Value Date   WBC 7.5 03/20/2012   HGB 10.9* 03/20/2012   HCT 33.9* 03/20/2012   MCV 79.2 03/20/2012   PLT 463* 03/20/2012    Lab Results  Component Value Date   ALT 15 03/19/2012   AST 21 03/19/2012   ALKPHOS 103 03/19/2012   BILITOT 0.2* 03/19/2012     Radiology:    CXR:  NAD  EKG:   Sinus tachycardia rate 103 with RAD, mild QTc prolongation, old inferior infarct and no acute ST T wave changes.  03/20/2012  ASSESSMENT AND PLAN:   Chest pain: No objective evidence of  ischemia. Given this and the recent negative workup no further cardiovascular testing is suggested.  Substance abuse :  I see if no contraindication to inpatient rehabilitation.  CAD: As above no further cardiovascular testing is suggested. He  needs continued risk reduction.  SignedRollene Rotunda 03/20/2012, 3:31 PM

## 2012-03-20 NOTE — ED Notes (Signed)
Patient complaining of nausea at this time.  Patient is painfree at this time.

## 2012-03-20 NOTE — H&P (Signed)
[facility]  Date: 03/20/2012               Patient Name:  [name] MRN: [id number]  DOB: 05-27-1950 Age / Sex: 62 y.o., male   PCP: Provider Not In System               Chief Complaint: Chest pain  History of Present Illness: Patient is a 62 y.o. male with a PMHx of anxiety, depression, hypertension, coronary artery disease status post PCI in 2007, hyperlipidemia, bipolar disorder, who presents to [facility] for evaluation of acute onset chest pain that began at 3 PM on the day of admission while the patient was resting. Pain is described as a heaviness chest pain located over mid chest. The pain is rated as a 10/10 in severity at onset and currently rated 4/10. The pain is notably aggravated by nothing. It is alleviated by none. denies associated diaphoresis, nausea, near syncope, palpitations, shortness of breath and syncope. For the pain, the patient tried nothing for pain relief. Patient also mentioned that the main reason that he came to the ER was that he wants to be admitted to behavioral health for detox. He said that he finished his Percocet and Xanax pills yesterday and since then has been drinking alcohol to get rid of the withdrawal symptoms.   Otherwise, the patient denies diaphoresis, nausea, near syncope, palpitations, shortness of breath and syncope.   Current Outpatient Medications:  (Not in a hospital admission)  Allergies: Allergies  Allergen Reactions  . Ambien (Zolpidem Tartrate) Other (See Comments)    Sleep Walking  . Codeine Itching     Past Medical History: Past Medical History  Diagnosis Date  . Chest pain   . MVP (mitral valve prolapse)   . Anxiety   . Hypertension   . Coronary artery disease     s/p PCI to RCA in 2007  . Hyperlipidemia   . Palpitations   . Bipolar disorder   . Depression   . Esophagitis   . Gastritis   . Insomnia   . Seizures   . Pneumonia 2005  . Arthritis     Past Surgical History: Past Surgical History  Procedure  Laterality Date  . Coronary angioplasty  2007    RCA  . Cardiac catheterization  04/2008    EF 65%  . Joint replacement    . Knee arthroscopy, medial patello femoral ligament repair    . Hip arthroplasty  bilateral   . Shoulder arthroscopy  right  . Tonsillectomy    . Total hip revision  02/07/2012    Procedure: TOTAL HIP REVISION;  Surgeon: [name], MD;  Location: [facility] OR;  Service: Orthopedics;  Laterality: Left;  [place] Hip Revision Set    Family History: Family History  Problem Relation Age of Onset  . Heart disease Neg Hx   . Prostate cancer Father     Social History: History   Social History  . Marital Status: Single    Spouse Name: N/A    Number of Children: N/A  . Years of Education: N/A   Occupational History  . Not on file.   Social History Main Topics  . Smoking status: Former Smoker    Quit date: 10/22/2011  . Smokeless tobacco: Not on file  . Alcohol Use: 0.0 oz/week    12-15 Cans of beer per week  . Drug Use: 7.00 per week    Special: Benzodiazepines     Comment: xanex  .  Sexually Active: Yes   Other Topics Concern  . Not on file   Social History Narrative  . No narrative on file    Review of Systems: Constitutional:  denies fever, chills, diaphoresis, appetite change and fatigue.  HEENT: denies photophobia, eye pain, redness, hearing loss, ear pain, congestion, sore throat, rhinorrhea, sneezing, neck pain, neck stiffness and tinnitus.  Respiratory: denies SOB, DOE, cough, chest tightness, and wheezing.  Cardiovascular: denies palpitations and leg swelling.  Gastrointestinal: denies nausea, vomiting, abdominal pain, diarrhea, constipation, blood in stool.  Genitourinary: denies dysuria, urgency, frequency, hematuria, flank pain and difficulty urinating.  Musculoskeletal: denies  myalgias, back pain, joint swelling, arthralgias and gait problem.   Skin: denies pallor, rash and wound.  Neurological: denies dizziness, seizures, syncope,  weakness, light-headedness, numbness and headaches.   Hematological: denies adenopathy, easy bruising, personal or family bleeding history.  Psychiatric/ Behavioral: denies suicidal ideation, mood changes, confusion, nervousness, sleep disturbance and agitation.    Vital Signs: Blood pressure 166/108, pulse 107, temperature 98.8 F (37.1 C), temperature source Oral, resp. rate 20, SpO2 100.00%.  Physical Exam: General: Vital signs reviewed and noted. Seems anxious. Well-developed, well-nourished, in no acute distress; alert, appropriate and cooperative throughout examination.  Head: Normocephalic, atraumatic.  Eyes: PERRL, EOMI, No signs of anemia or jaundince.  Nose: Mucous membranes moist, not inflammed, nonerythematous.  Throat: Oropharynx nonerythematous, no exudate appreciated.   Neck: No deformities, masses, or tenderness noted.Supple, No carotid Bruits, no JVD.  Lungs:  Normal respiratory effort. Clear to auscultation BL without crackles or wheezes.  Heart: RRR. S1 and S2 normal without gallop, murmur, or rubs.  Abdomen:  BS normoactive. Soft, Nondistended, non-tender.  No masses or organomegaly.  Extremities: No pretibial edema.  Neurologic: A&O X3, CN II - XII are grossly intact. Motor strength is 5/5 in the all 4 extremities, Sensations intact to light touch, Cerebellar signs negative.  Skin: No visible rashes, scars.    Lab results: Basic Metabolic Panel:  Recent Labs  16/10/96 2313  NA 138  K 3.5  CL 104  GLUCOSE 93  BUN <3*  CREATININE 0.70   Liver Function Tests:  Recent Labs  03/19/12 2347  AST 21  ALT 15  ALKPHOS 103  BILITOT 0.2*  PROT 7.8  ALBUMIN 4.3    Recent Labs  03/19/12 2347  LIPASE 102*   No results found for this basename: AMMONIA,  in the last 72 hours CBC:  Recent Labs  03/19/12 2313 03/19/12 2347  WBC  --  8.2  NEUTROABS  --  4.7  HGB 13.6 11.7*  HCT 40.0 36.2*  MCV  --  79.0  PLT  --  484*   Cardiac Enzymes: No  results found for this basename: CKTOTAL, CKMB, CKMBINDEX, TROPONINI,  in the last 72 hours BNP:  Recent Labs  03/19/12 2223  PROBNP 58.3   Coagulation:  Recent Labs  03/19/12 2223  LABPROT 13.4  INR 1.03   Urine Drug Screen: Drugs of Abuse     Component Value Date/Time   LABOPIA NONE DETECTED 10/11/2011 1150   COCAINSCRNUR NONE DETECTED 10/11/2011 1150   LABBENZ NONE DETECTED 10/11/2011 1150   AMPHETMU NONE DETECTED 10/11/2011 1150   THCU NONE DETECTED 10/11/2011 1150   LABBARB NONE DETECTED 10/11/2011 1150    Alcohol Level:  Recent Labs  03/19/12 2348  ETH 58*    Imaging results:  Dg Chest 2 View  03/20/2012  *RADIOLOGY REPORT*  Clinical Data: Chest pain  CHEST - 2 VIEW  Comparison: 10/27/2011 and prior chest radiographs  Findings: The cardiomediastinal silhouette is unremarkable. The lungs are clear. There is no evidence of focal airspace disease, pulmonary edema, suspicious pulmonary nodule/mass, pleural effusion, or pneumothorax. No acute bony abnormalities are identified. A remote upper thoracic compression fracture is again noted.  IMPRESSION: No evidence of acute cardiopulmonary disease.   Original Report Authenticated By: Harmon Pier, M.D.       Other results:  EKG (03/20/2012) - Normal Sinus Rhythm , regular rate of approximately 97 bpm, normal axis, ST segments: nonspecific ST changes and nonspecific ST/T changes.   Last Echo (08/16/2009): Left ventricle: The cavity size was normal. Wall thickness wasincreased in a pattern of mild LVH. Systolic function was normal.The estimated ejection fraction was in the range of 55% to 60%. Wall motion was normal; there were no regional wall motion abnormalities. Doppler parameters are consistent with abnormal left ventricular relaxation (grade 1 diastolic dysfunction).    Myocardial perfusion imaging (01/02/2012)  Impression  Exercise Capacity: Lexiscan with no exercise.  BP Response: Normal blood pressure response.   Clinical Symptoms: There is dyspnea.  ECG Impression: No significant ST segment change suggestive of ischemia.  Comparison with Prior Nuclear Study: No images to compare  Overall Impression: Normal stress nuclear study.  LV Ejection Fraction: 71%. LV Wall Motion: NL LV Function; NL Wall Motion   Assessment & Plan: Pt is a 62 y.o. yo male with a PMHX of coronary artery disease, hypertension and hyperlipidemia, who presented to [facility] on 03/19/2012 with symptoms of mid chest chest pain, with EKG showing no acute changes and initial troponins of negative. Patient had a negative stress test in December 2013 2 months ago and he agrees that his main reason for admission today is because he wants to get admitted to behavioral health for detox.     Chest pain - patient does have known history of coronary artery disease. He also has cardiac risk factors including the following: existing CAD  hypertension  hyperlipidemia  smoker. Admission EKG showed no EKG changes, and cardiac enzymes have been negative. Therefore, management at this time will be focused on repeating the cardiac enzymes 2 more times and monitoring for arrhythmias. We will also strict risk factor modification and management.   Admit to   Continue to cycle cardiac enzymes.  Continue aspirin, beta blocker, statin therapy.  Continue vicodin, oxygen, PRN nitroglycerin.  Check HgA1c, TSH for risk factor stratification.    Substance abuse -Behavioral health needs to be consulted in the morning for possible admission for detox -CIWA protocol and admission to telemetry for withdrawal symptoms  The rest of the medical management to be continued as per home medications.    DVT PPX - low molecular weight heparin  CODE STATUS - Full  CONSULTS PLACED - None  DISPO - Disposition is deferred at this time, awaiting improvement of chest pain.   Anticipated discharge in approximately 2 day(s).  The patient does not have a current  PCP (Provider Not In System),     Signed: Lars Mage, MD  03/20/2012, 1:44 AM    [email or web address] for coverage details

## 2012-03-20 NOTE — Progress Notes (Signed)
Utilization review completed.  P.J. Reagan Klemz,RN,BSN Case Manager [phone]  

## 2012-03-20 NOTE — Progress Notes (Signed)
Patient ID: [name]  male  QMV:[id number]    DOB: Nov 02, 1950    DOA: 03/19/2012  PCP: Provider Not In System  Patient seen and examined, admitted this morning, H&P reviewed   Assessment/Plan: Principal Problem:   Chest pain at rest: Known history of coronary artery ds, risk factors of hypertension, hyperlipidemia, nicotine abuse - Cardiac enzymes negative so far, continue aspirin, beta blocker, statin therapy, cardiology consult called, [facility]  Active Problems:    HTN (hypertension) stable    Hyperlipidemia    Polysubstance abuse with alcohol abuse - Placed on CIWA scale for alcohol, last drink yesterday -Psychiatry consultation obtained due to patient's request for detox. He wants to [facility]'ed to [facility] when ready    DVT Prophylaxis:  Code Status:  Disposition:Pending cardiology and psychiatry consultation    Subjective: Tearful, chest pain improving  Objective: Weight change:   Intake/Output Summary (Last 24 hours) at 03/20/12 1515 Last data filed at 03/20/12 1234  Gross per 24 hour  Intake 936.67 ml  Output    300 ml  Net 636.67 ml   Blood pressure 116/81, pulse 99, temperature 97.9 F (36.6 C), temperature source Oral, resp. rate 19, height 5\' 10"  (1.778 m), weight 82 kg (180 lb 12.4 oz), SpO2 98.00%.  Physical Exam: General: Alert and awake, oriented x3, not in any acute distress. HEENT: anicteric sclera, pupils reactive to light and accommodation, EOMI CVS: S1-S2 clear, no murmur rubs or gallops Chest: clear to auscultation bilaterally, no wheezing, rales or rhonchi Abdomen: soft nontender, nondistended, normal bowel sounds, no organomegaly Extremities: no cyanosis, clubbing or edema noted bilaterally Neuro: Cranial nerves II-XII intact, no focal neurological deficits  Lab Results: Basic Metabolic Panel:  Recent Labs Lab 03/19/12 2313 03/20/12 0425  NA 138 134*  K 3.5 3.2*  CL 104 99  CO2  --  22  GLUCOSE 93 99  BUN <3* 6  CREATININE 0.70 0.68   CALCIUM  --  9.5   Liver Function Tests:  Recent Labs Lab 03/19/12 2347  AST 21  ALT 15  ALKPHOS 103  BILITOT 0.2*  PROT 7.8  ALBUMIN 4.3    Recent Labs Lab 03/19/12 2347  LIPASE 102*   No results found for this basename: AMMONIA,  in the last 168 hours CBC:  Recent Labs Lab 03/19/12 2347 03/20/12 0425  WBC 8.2 7.5  NEUTROABS 4.7  --   HGB 11.7* 10.9*  HCT 36.2* 33.9*  MCV 79.0 79.2  PLT 484* 463*   Cardiac Enzymes:  Recent Labs Lab 03/20/12 0425 03/20/12 0951  TROPONINI <0.30 <0.30   BNP: No components found with this basename: POCBNP,  CBG: No results found for this basename: GLUCAP,  in the last 168 hours   Micro Results: No results found for this or any previous visit (from the past 240 hour(s)).  Studies/Results: Dg Chest 2 View  03/20/2012  *RADIOLOGY REPORT*  Clinical Data: Chest pain  CHEST - 2 VIEW  Comparison: 10/27/2011 and prior chest radiographs  Findings: The cardiomediastinal silhouette is unremarkable. The lungs are clear. There is no evidence of focal airspace disease, pulmonary edema, suspicious pulmonary nodule/mass, pleural effusion, or pneumothorax. No acute bony abnormalities are identified. A remote upper thoracic compression fracture is again noted.  IMPRESSION: No evidence of acute cardiopulmonary disease.   Original Report Authenticated By: Harmon Pier, M.D.     Medications: Scheduled Meds: . aspirin  81 mg Oral Daily  . atorvastatin  40 mg Oral Daily  . buPROPion  300  mg Oral Daily  . enoxaparin (LOVENOX) injection  40 mg Subcutaneous Q24H  . folic acid  1 mg Oral Daily  . metoprolol succinate  25 mg Oral Daily  . multivitamin with minerals  1 tablet Oral Daily  . nicotine  7 mg Transdermal Q24H  . nitroGLYCERIN  0.5 inch Topical Q6H  . QUEtiapine  150 mg Oral QHS  . sodium chloride  3 mL Intravenous Q12H  . thiamine  100 mg Oral Daily   Or  . thiamine  100 mg Intravenous Daily      LOS: 1 day   RAI,RIPUDEEP  M.D. [facility] 03/20/2012, 3:15 PM Pager: [phone]  If 7PM-7AM, please contact night-coverage [email or web address] Password [email or web address]

## 2012-03-20 NOTE — ED Notes (Signed)
Patient returned from xray.

## 2012-03-21 ENCOUNTER — Telehealth (HOSPITAL_COMMUNITY): Payer: Self-pay | Admitting: Licensed Clinical Social Worker

## 2012-03-21 ENCOUNTER — Inpatient Hospital Stay (HOSPITAL_COMMUNITY): Admission: AD | Admit: 2012-03-21 | Payer: Medicare Other | Source: Intra-hospital | Admitting: Psychiatry

## 2012-03-21 DIAGNOSIS — I10 Essential (primary) hypertension: Secondary | ICD-10-CM

## 2012-03-21 DIAGNOSIS — R079 Chest pain, unspecified: Secondary | ICD-10-CM

## 2012-03-21 DIAGNOSIS — D62 Acute posthemorrhagic anemia: Secondary | ICD-10-CM

## 2012-03-21 DIAGNOSIS — F319 Bipolar disorder, unspecified: Secondary | ICD-10-CM

## 2012-03-21 DIAGNOSIS — E785 Hyperlipidemia, unspecified: Secondary | ICD-10-CM

## 2012-03-21 DIAGNOSIS — F1020 Alcohol dependence, uncomplicated: Secondary | ICD-10-CM

## 2012-03-21 DIAGNOSIS — F1110 Opioid abuse, uncomplicated: Secondary | ICD-10-CM

## 2012-03-21 LAB — CBC
Hemoglobin: 9.8 g/dL — ABNORMAL LOW (ref 13.0–17.0)
MCH: 25.6 pg — ABNORMAL LOW (ref 26.0–34.0)
RBC: 3.83 MIL/uL — ABNORMAL LOW (ref 4.22–5.81)
WBC: 5.7 10*3/uL (ref 4.0–10.5)

## 2012-03-21 LAB — BASIC METABOLIC PANEL
CO2: 22 mEq/L (ref 19–32)
Calcium: 9.4 mg/dL (ref 8.4–10.5)
Chloride: 102 mEq/L (ref 96–112)
Glucose, Bld: 136 mg/dL — ABNORMAL HIGH (ref 70–99)
Sodium: 136 mEq/L (ref 135–145)

## 2012-03-21 NOTE — Progress Notes (Signed)
Patient ID: [name]  male  RUE:[id number]    DOB: 1950/04/15    DOA: 03/19/2012  PCP: Provider Not In System  Patient seen and examined, admitted this morning, H&P reviewed   Assessment/Plan: Principal Problem:   Chest pain at rest: Known history of coronary artery ds, risk factors of hypertension, hyperlipidemia, nicotine abuse - Cardiac enzymes negative so far - continue aspirin, beta blocker, statin therapy,  - Normal lexiscan within last year, no further ischemia w/u at this time, cardiology signed off Active Problems:    HTN (hypertension) stable    Hyperlipidemia    Polysubstance abuse with alcohol abuse - Placed on CIWA scale for alcohol,so far stable -Psychiatry consultation obtained due to patient's request for detox. He wants to [facility]ed [facility]. Currently medically stable when okay with psych.   DVT Prophylaxis:  Code Status:  Disposition:Pending psychiatry consultation    Subjective: Feeling better, no CP, SOB, eating breakfast  Objective: Weight change: 1.734 kg (3 lb 13.2 oz)  Intake/Output Summary (Last 24 hours) at 03/21/12 1001 Last data filed at 03/21/12 0900  Gross per 24 hour  Intake    960 ml  Output   1000 ml  Net    -40 ml   Blood pressure 92/60, pulse 80, temperature 97.9 F (36.6 C), temperature source Axillary, resp. rate 20, height 5\' 10"  (1.778 m), weight 83.734 kg (184 lb 9.6 oz), SpO2 95.00%.  Physical Exam: General: Alert and awake, oriented x3, not in any acute distress. CVS: S1-S2 clear, no murmur rubs or gallops Chest: CTAB Abdomen: soft nontender, nondistended, normal bowel sounds Extremities: no cyanosis, clubbing or edema noted bilaterally   Lab Results: Basic Metabolic Panel:  Recent Labs Lab 03/19/12 2313 03/20/12 0425  NA 138 134*  K 3.5 3.2*  CL 104 99  CO2  --  22  GLUCOSE 93 99  BUN <3* 6  CREATININE 0.70 0.68  CALCIUM  --  9.5   Liver Function Tests:  Recent Labs Lab 03/19/12 2347  AST 21  ALT 15   ALKPHOS 103  BILITOT 0.2*  PROT 7.8  ALBUMIN 4.3    Recent Labs Lab 03/19/12 2347  LIPASE 102*   No results found for this basename: AMMONIA,  in the last 168 hours CBC:  Recent Labs Lab 03/19/12 2347 03/20/12 0425  WBC 8.2 7.5  NEUTROABS 4.7  --   HGB 11.7* 10.9*  HCT 36.2* 33.9*  MCV 79.0 79.2  PLT 484* 463*   Cardiac Enzymes:  Recent Labs Lab 03/20/12 0425 03/20/12 0951  TROPONINI <0.30 <0.30   BNP: No components found with this basename: POCBNP,  CBG: No results found for this basename: GLUCAP,  in the last 168 hours   Micro Results: No results found for this or any previous visit (from the past 240 hour(s)).  Studies/Results: Dg Chest 2 View  03/20/2012  *RADIOLOGY REPORT*  Clinical Data: Chest pain  CHEST - 2 VIEW  Comparison: 10/27/2011 and prior chest radiographs  Findings: The cardiomediastinal silhouette is unremarkable. The lungs are clear. There is no evidence of focal airspace disease, pulmonary edema, suspicious pulmonary nodule/mass, pleural effusion, or pneumothorax. No acute bony abnormalities are identified. A remote upper thoracic compression fracture is again noted.  IMPRESSION: No evidence of acute cardiopulmonary disease.   Original Report Authenticated By: Harmon Pier, M.D.     Medications: Scheduled Meds: . aspirin  81 mg Oral Daily  . atorvastatin  40 mg Oral Daily  . buPROPion  300 mg  Oral Daily  . enoxaparin (LOVENOX) injection  40 mg Subcutaneous Q24H  . folic acid  1 mg Oral Daily  . metoprolol succinate  25 mg Oral Daily  . multivitamin with minerals  1 tablet Oral Daily  . nicotine  7 mg Transdermal Q24H  . nitroGLYCERIN  0.5 inch Topical Q6H  . QUEtiapine  150 mg Oral QHS  . sodium chloride  3 mL Intravenous Q12H  . thiamine  100 mg Oral Daily      LOS: 2 days   RAI,RIPUDEEP M.D. [facility] 03/21/2012, 10:01 AM Pager: [phone]  If 7PM-7AM, please contact night-coverage [email or web address] Password  [email or web address]

## 2012-03-21 NOTE — BH Assessment (Signed)
Assessment Note   [name] is an 62 y.o. male who was referred for admission from a medical floor at the recommendation of Dr. Mickeal Skinner. Per Dr. Ferol Luz:  History of Present Illness: Pt says he has been drinking since hsi 20s.  He had just had a reconstruction of prior hip replacement and was taking a variety of narcotics Rx for pain.  His refills were declined so he decided to drink alcohol.  It did not alleviate the pain.  He drank beer 12-15 bottles; alt with wine 2-3 bottles or bourbon a 1/5.  He became depressed and had to stop drinking.  He did not eat for three days.  He developed chest pain and went to the ED Past Psychiatric History: Pt has been drinking over many years.  He has 3 DUIs and went to [facility] for 30 days to get two charges reduced, had to pay for the eird one.  He has been depressed, and some phases of elated mood but never 'manic'  He has been on spending sprees.  He was diagnosed 2006 as Bipolar I after he had a joint replacement.  He takes Wellbutrin XL 300 mg in am and Seroquel SR 200 mg at night.  He is gy and has had relationships; not in one now. He has an older brother who vanished after excessive alcoholism 15-20 bottles/day for years, a younger brother who drinks socially and may smoke cannabis without functional disturbance.  His mother had Bipolar I DO and committed suicide while in an institution.  Pt describes hx as Bipolar I and chronic alcohol dependence.  He has had DUIs and multiple hospitalizations for alcohol rehabilitation.  He has had multiple surgeries and overused pain medications that led for compulsive drinking of alcohol.  He now wants detox/rehabilitation program.  He has a family hx of Bipolar disorder and father drank alcohol.  He has been susceptible to these two family conditions.  He also grieves for a mother who killed herself.  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 He goes to [employer] and will continue once he is discharge from any  and all hospital programs.  He has not been eating well which may influence his outcome and long term effects of excessive alcohol.   PER HOLLY GERBER, LCSW:   CSW received referral to complete psychosocial assessment. CSW reviewed chart and spoke with psych MD regarding recommendations. CSW met with patient at bedside and visitor stepped out of room for confidentiality purposes.      CSW introduced myself and explained role. Patient reports that he was admitted due to chest pain. 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 Patient had hip replacement any was prescribed pain medication. Patient did not take medication as prescribed and ran out of medication. Patient begun drinking alcohol due to the pain.      Patient agreeable to discuss substance abuse in the past. Patient has been drinking for several years and has 3 DUIs. Patient received latest charge last year and was informed by lawyer that he should go to detox. Patient was admitted to [facility] for about 5 days and then went to [facility] 30 day rehab. Patient was sober from August 2013-January 2014. Patient has also been at other facilities for substance abuse. Patient desires placement again in order to become sober. Patient feels that since using alcohol again that he feels more depressed.      Patient was diagnosed with bipolar  disorder in 2007. Patient's mother had bipolar and patient felt he had similar symptoms. Patient has been going to [facility] every 3 months for medication management. Patient reports that his medication is helpful and is compliant with treatment.      Patient is on disability and lives with roommate for the past four years. Roommate wants to be in a relationship with patient but patient does not feel he and roommate are compatible. Patient wants to find another place to live because roommate drinks and patient feels he cannot remain sober living with him. Patient's mother has passed away but patient  has contact with father and brothers.      Patient appropriate and pleasant throughout assessment. Patient engaged and maintained eye contact. Patient is compliant with MH treatment and is seeking treatment for substance abuse. Patient has good insight and it able to reflect on triggers for using. Patient agreeable to change environment and seek treatment in order to maintain sober. CSW explained process of finding treatment and patient agreeable.      CSW faxed referral to [facility] to determine if patient would be eligible for treatment. CSW will continue to follow.     Axis I: 303.90 Alcohol Dependence; 296.53  Bipolar I Disorder, Most Recent Episode Depressed, Severe Without Psychotic Features; 305.50 Opioid Abuse     Axis II: Deferred Axis III:  Past Medical History  Diagnosis Date  . MVP (mitral valve prolapse)   . Anxiety   . Hypertension   . Coronary artery disease     s/p PCI to RCA in 2007  . Hyperlipidemia   . Palpitations   . Bipolar disorder   . Depression   . Esophagitis   . Gastritis   . Insomnia   . Seizures   . Pneumonia 2005  . Arthritis    Axis IV: economic problems, housing problems, problems related to social environment and problems with primary support group Axis V: GAF=30  Past Medical History:  Past Medical History  Diagnosis Date  . MVP (mitral valve prolapse)   . Anxiety   . Hypertension   . Coronary artery disease     s/p PCI to RCA in 2007  . Hyperlipidemia   . Palpitations   . Bipolar disorder   . Depression   . Esophagitis   . Gastritis   . Insomnia   . Seizures   . Pneumonia 2005  . Arthritis     Past Surgical History  Procedure Laterality Date  . Coronary angioplasty  2007    RCA  . Cardiac catheterization  04/2008    EF 65%  . Joint replacement    . Knee arthroscopy, medial patello femoral ligament repair    . Hip arthroplasty  bilateral   . Shoulder arthroscopy  right  . Tonsillectomy    . Total hip revision  02/07/2012     Procedure: TOTAL HIP REVISION;  Surgeon: [name], MD;  Location: [facility] OR;  Service: Orthopedics;  Laterality: Left;  [place] Hip Revision Set    Family History:  Family History  Problem Relation Age of Onset  . Heart disease Neg Hx   . Prostate cancer Father     Social History:  reports that he quit smoking about 4 months ago. He does not have any smokeless tobacco history on file. He reports that  drinks alcohol. He reports that he uses illicit drugs (Benzodiazepines) about 7 times per week.  Additional Social History:  Alcohol / Drug Use Pain Medications: Various  narcotic pain medications Prescriptions: Various narcotic pain medications Over the Counter: None History of alcohol / drug use?: Yes Longest period of sobriety (when/how long): August 2013-January 2014 Negative Consequences of Use: Financial;Personal relationships;Legal Withdrawal Symptoms: Change in blood pressure;Sweats;Nausea / Vomiting;Diarrhea Substance #1 Name of Substance 1: Alcohol 1 - Age of First Use: 44s 1 - Amount (size/oz): 12-15 bottles of beer or 2-3 bottle s wine or a fifth of burbon 1 - Frequency: daily 1 - Duration: Many years 1 - Last Use / Amount: 03/19/12 Substance #2 Name of Substance 2: Various narcotic pain medications 2 - Age of First Use: Unknown 2 - Amount (size/oz): unknown 2 - Frequency: unknown 2 - Duration: unknown 2 - Last Use / Amount: 03/19/12  CIWA: CIWA-Ar Nausea and Vomiting: no nausea and no vomiting Tactile Disturbances: none Tremor: two Auditory Disturbances: not present Paroxysmal Sweats: no sweat visible Visual Disturbances: not present Anxiety: mildly anxious Headache, Fullness in Head: none present Agitation: normal activity Orientation and Clouding of Sensorium: oriented and can do serial additions CIWA-Ar Total: 3 COWS: Clinical Opiate Withdrawal Scale (COWS) Resting Pulse Rate: Pulse Rate 81-100 Sweating: No report of chills or flushing Restlessness:  Able to sit still Pupil Size: Pupils pinned or normal size for room light Bone or Joint Aches: Not present Runny Nose or Tearing: Not present GI Upset: No GI symptoms Tremor: Slight tremor observable Yawning: No yawning Anxiety or Irritability: Patient reports increasing irritability or anxiousness Gooseflesh Skin: Skin is smooth COWS Total Score: 4  Allergies:  Allergies  Allergen Reactions  . Ambien (Zolpidem Tartrate) Other (See Comments)    Sleep Walking  . Codeine Itching    Home Medications:  (Not in a hospital admission)  OB/GYN Status:  No LMP for male patient.  General Assessment Data Location of Assessment: [facility] Assessment Services Living Arrangements: Non-relatives/Friends (Roommate) Can pt return to current living arrangement?: No (Pt wants to find another residence because roommate drinks) Admission Status: Voluntary Is patient capable of signing voluntary admission?: Yes Transfer from: Acute Hospital Referral Source: Medical Floor Inpatient  Education Status Is patient currently in school?: No Current Grade: NA Highest grade of school patient has completed: NA Name of school: NA Contact person: NA  Risk to self Suicidal Ideation: No Suicidal Intent: No Is patient at risk for suicide?: No Suicidal Plan?: No Access to Means: No What has been your use of drugs/alcohol within the last 12 months?: Pt abusing alcohol and pain medications Previous Attempts/Gestures: No (Unknown) How many times?: 0 Other Self Harm Risks: None identified Triggers for Past Attempts: None known Intentional Self Injurious Behavior: None Family Suicide History: See progress notes;Yes (Mother commited suicide) Recent stressful life event(s): Financial Problems;Legal Issues Persecutory voices/beliefs?: No Depression: Yes Depression Symptoms: Despondent;Tearfulness;Isolating;Fatigue;Guilt;Loss of interest in usual pleasures;Feeling worthless/self pity;Feeling  angry/irritable Substance abuse history and/or treatment for substance abuse?: Yes Suicide prevention information given to non-admitted patients: Not applicable  Risk to Others Homicidal Ideation: No Thoughts of Harm to Others: No Current Homicidal Intent: No Current Homicidal Plan: No Access to Homicidal Means: No Identified Victim: None History of harm to others?: No Assessment of Violence: None Noted Violent Behavior Description: None Does patient have access to weapons?: No Criminal Charges Pending?: Yes Describe Pending Criminal Charges: Three DUIs Does patient have a court date: Yes Court Date: 03/21/12 (Unknown)  Psychosis Hallucinations: None noted Delusions: None noted  Mental Status Report Appear/Hygiene: Other (Comment) [facility] gown) Eye Contact: Fair Motor Activity: Unremarkable Speech: Logical/coherent Level of Consciousness: Alert  Mood: Depressed Affect: Depressed Anxiety Level: Minimal Thought Processes: Coherent;Relevant Judgement: Unimpaired Orientation: Person;Place;Time;Situation Obsessive Compulsive Thoughts/Behaviors: None  Cognitive Functioning Concentration: Normal Memory: Recent Intact;Remote Intact IQ: Average Insight: Fair Impulse Control: Fair Appetite: Poor Weight Loss: 5 Weight Gain: 0 Sleep: No Change Total Hours of Sleep: 7 Vegetative Symptoms: None  ADLScreening [facility] Assessment Services) Patient's cognitive ability adequate to safely complete daily activities?: Yes Patient able to express need for assistance with ADLs?: Yes Independently performs ADLs?: Yes (appropriate for developmental age)  Abuse/Neglect [facility]) Physical Abuse: Denies Verbal Abuse: Denies Sexual Abuse: Denies  Prior Inpatient Therapy Prior Inpatient Therapy: Yes Prior Therapy Dates: Multiple admits Prior Therapy Facilty/Provider(s): various facilities Reason for Treatment: Alcohol dependence, bipolar disorder  Prior Outpatient Therapy Prior  Outpatient Therapy: Yes Prior Therapy Dates: Numerous Prior Therapy Facilty/Provider(s): [facility] Reason for Treatment: Alcohol dependence, bipolar disorder  ADL Screening (condition at time of admission) Patient's cognitive ability adequate to safely complete daily activities?: Yes Patient able to express need for assistance with ADLs?: Yes Independently performs ADLs?: Yes (appropriate for developmental age) Weakness of Legs: None Weakness of Arms/Hands: None  Home Assistive Devices/Equipment Home Assistive Devices/Equipment: None    Abuse/Neglect Assessment (Assessment to be complete while patient is alone) Physical Abuse: Denies Verbal Abuse: Denies Sexual Abuse: Denies Exploitation of patient/patient's resources: Denies Self-Neglect: Denies     Merchant navy officer (For Healthcare) Advance Directive: Patient does not have advance directive;Patient would not like information Pre-existing out of facility DNR order (yellow form or pink MOST form): No Nutrition Screen- [facility] Adult/[facility]/AP Patient's home diet: Regular Have you recently lost weight without trying?: No Have you been eating poorly because of a decreased appetite?: No Malnutrition Screening Tool Score: 0  Additional Information 1:1 In Past 12 Months?: No CIRT Risk: No Elopement Risk: No Does patient have medical clearance?: Yes     Disposition:  Disposition Initial Assessment Completed: Yes Disposition of Patient: Inpatient treatment program Type of inpatient treatment program: Adult  On Site Evaluation by:   Reviewed with Physician: Armandina Stammer, NP  Per Thurman Coyer, [facility] Pt has been accepted when an appropriate bed is available, which should be later today pending discharges.   Patsy Baltimore, Harlin Rain 03/21/2012 3:48 PM

## 2012-03-21 NOTE — Consult Note (Signed)
Patient Identification:  [name] Date of Evaluation:  03/21/2012 Reason for Consult:  Alcohol Withdrawal  Referring Provider:  Dr. Isidoro Donning  History of Present Illness: Pt says he has been drinking since hsi 20s.  He had just had a reconstruction of prior hip replacement and was taking a variety of narcotics Rx for pain.  His refills were declined so he decided to drink alcohol.  It did not alleviate the pain.  He drank beer 12-15 bottles; alt with wine 2-3 bottles or bourbon a 1/5.  He became depressed and had to stop drinking.  He did not eat for three days.  He developed chest pain and wnt to the ED  Past Psychiatric History: Pt has been drinking over many years.  He has 3 DUIs and went to [facility] for 30 days to get two charges reduced, had to pay for the eird one.  He has been depressed, and some phases of elated mood but never 'manic'  He has been on spending sprees.  He was diagnosed 2006 as Bipolar I after he had a joint replacement.  He takes Wellbutrin XL 300 mg in am and Seroquel SR 200 mg at night.  He is gy and has had relationships; not in one now.  He has an older brother who vanished after excessive alcoholism 15-20 bottles/day for years, a younger brother who drinks socially and may smoke cannabis without functional disturbance.  His mother had Bipolar I DO and committed suicide while in an institution.   Past Medical History:     Past Medical History  Diagnosis Date  . MVP (mitral valve prolapse)   . Anxiety   . Hypertension   . Coronary artery disease     s/p PCI to RCA in 2007  . Hyperlipidemia   . Palpitations   . Bipolar disorder   . Depression   . Esophagitis   . Gastritis   . Insomnia   . Seizures   . Pneumonia 2005  . Arthritis        Past Surgical History  Procedure Laterality Date  . Coronary angioplasty  2007    RCA  . Cardiac catheterization  04/2008    EF 65%  . Joint replacement    . Knee arthroscopy, medial patello femoral ligament repair    . Hip  arthroplasty  bilateral   . Shoulder arthroscopy  right  . Tonsillectomy    . Total hip revision  02/07/2012    Procedure: TOTAL HIP REVISION;  Surgeon: [name], MD;  Location: [facility] OR;  Service: Orthopedics;  Laterality: Left;  [place] Hip Revision Set    Allergies:  Allergies  Allergen Reactions  . Ambien (Zolpidem Tartrate) Other (See Comments)    Sleep Walking  . Codeine Itching    Current Medications:  Prior to Admission medications   Medication Sig Start Date End Date Taking? Authorizing Provider  aspirin EC 325 MG tablet Take 325 mg by mouth every morning.   Yes Historical Provider, MD  atorvastatin (LIPITOR) 40 MG tablet Take 40 mg by mouth daily.   Yes Peter M [place], MD  buPROPion (WELLBUTRIN XL) 300 MG 24 hr tablet Take 300 mg by mouth daily.    Yes Verne Spurr, PA-C  ibuprofen (ADVIL,MOTRIN) 200 MG tablet Take 800 mg by mouth every 8 (eight) hours as needed for pain.   Yes Historical Provider, MD  metoprolol succinate (TOPROL-XL) 25 MG 24 hr tablet Take 25 mg by mouth daily.   Yes Theron Arista  M [place], MD  QUEtiapine (SEROQUEL XR) 50 MG TB24 Take 150 mg by mouth at bedtime. Take 3 tablets (150 mg total) at bed time   Yes Historical Provider, MD  QUEtiapine (SEROQUEL XR) 50 MG TB24 Take 50 mg by mouth daily as needed (stress).   Yes Historical Provider, MD    Social History:    reports that he quit smoking about 4 months ago. He does not have any smokeless tobacco history on file. He reports that  drinks alcohol. He reports that he uses illicit drugs (Benzodiazepines) about 7 times per week.   Family History:    Family History  Problem Relation Age of Onset  . Heart disease Neg Hx   . Prostate cancer Father     Mental Status Examination/Evaluation: Objective:  Appearance: Casual and wears glasses  Eye Contact::  Good  Speech:  Clear and Coherent and Normal Rate  Volume:  Normal  Mood:  depressed  Affect:  Congruent and Depressed  Thought Process:  Coherent,  Goal Directed and Logical  Orientation:  Full (Time, Place, and Person)  Thought Content:  Rumination and became opiod dependent, resorted to alcohol  Suicidal Thoughts:  No  Homicidal Thoughts:  No  Judgement:  Impaired  Insight:  Fair   DIAGNOSIS:   AXIS I  Alcohol Dependent, Bipolar I disorder,  Opioid Abuse  AXIS II  Deferred  AXIS III See medical notes.  AXIS IV housing problems, other psychosocial or environmental problems, problems related to social environment and Pt needs to move to more positive environment; roommate drinks too much  AXIS V 41-50 serious symptoms   Assessment/Plan:  Discussed with Dr. Isidoro Donning, Psych CSW Pt describes hx as Bipolar I and chronic alcohol dependence.  He has had DUIs and multiple hospitalizations for alcohol rehabilitation.  He has had multiple surgeries and overused pain medications that led for compulsive drinking of alcohol.  He now wants detox/rehabilitation program.  He has a family hx of Bipolar disorder and father drank alcohol.  He has been susceptible to this two family conditions.  He also grieves for a mother who killed herself.  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  He has not been eating well which may influence his outcome and long term effects of excessive alcohol RECOMMENDATION:  1.  Pt has capacity 2.  Agree with Ativan detox protocol with Thiamine and folic acid 3.  Agree with Wellbutrin XL 300 mg, Seroquel XR 200 mg. 4.  Discussed with Psych CSW - will search for possible psychiatric transfer for rehabilitation program 5.  Will follow pt Linkin Vizzini MD 03/21/2012 1:17 PM

## 2012-03-21 NOTE — Progress Notes (Signed)
PROGRESS NOTE  Subjective:   Pt has a hx of CAD - admitted for behavioral health admission for Detox.  Has had atypical CP for 3 days.  Had a normal myoview 10 months ago.  Enzymes are negative.  Objective:    Vital Signs:   Temp:  [97.9 F (36.6 C)-98.3 F (36.8 C)] 97.9 F (36.6 C) (02/27 0606) Pulse Rate:  [80-99] 80 (02/27 0606) Resp:  [19-20] 20 (02/26 1741) BP: (92-138)/(60-82) 92/60 mmHg (02/27 0606) SpO2:  [95 %-99 %] 95 % (02/27 0606) Weight:  [184 lb 9.6 oz (83.734 kg)] 184 lb 9.6 oz (83.734 kg) (02/27 0606)  Last BM Date: 03/18/12   24-hour weight change: Weight change: 3 lb 13.2 oz (1.734 kg)  Weight trends: Filed Weights   03/20/12 0405 03/21/12 0606  Weight: 180 lb 12.4 oz (82 kg) 184 lb 9.6 oz (83.734 kg)    Intake/Output:  02/26 0701 - 02/27 0700 In: 1296.7 [P.O.:960; I.V.:336.7] Out: 700 [Urine:700]     Physical Exam: BP 92/60  Pulse 80  Temp(Src) 97.9 F (36.6 C) (Axillary)  Resp 20  Ht 5\' 10"  (1.778 m)  Wt 184 lb 9.6 oz (83.734 kg)  BMI 26.49 kg/m2  SpO2 95%  General: Vital signs reviewed and noted.   Head: Normocephalic, atraumatic.  Eyes: conjunctivae/corneas clear.  EOM's intact.   Throat: normal  Neck:  normal  Lungs:    clear  Heart:  RR, normal S1, S2  Abdomen:  Soft, non-tender, non-distended    Extremities: No edema   Neurologic: A&O X3, CN II - XII are grossly intact.   Psych: Normal     Labs: BMET:  Recent Labs  03/19/12 2313 03/20/12 0425  NA 138 134*  K 3.5 3.2*  CL 104 99  CO2  --  22  GLUCOSE 93 99  BUN <3* 6  CREATININE 0.70 0.68  CALCIUM  --  9.5    Liver function tests:  Recent Labs  03/19/12 2347  AST 21  ALT 15  ALKPHOS 103  BILITOT 0.2*  PROT 7.8  ALBUMIN 4.3    Recent Labs  03/19/12 2347  LIPASE 102*    CBC:  Recent Labs  03/19/12 2347 03/20/12 0425  WBC 8.2 7.5  NEUTROABS 4.7  --   HGB 11.7* 10.9*  HCT 36.2* 33.9*  MCV 79.0 79.2  PLT 484* 463*    Cardiac  Enzymes:  Recent Labs  03/20/12 0425 03/20/12 0951  TROPONINI <0.30 <0.30    Coagulation Studies:  Recent Labs  03/19/12 2223  LABPROT 13.4  INR 1.03    Other: No components found with this basename: POCBNP,  No results found for this basename: DDIMER,  in the last 72 hours  Recent Labs  03/20/12 0425  HGBA1C 5.6   No results found for this basename: CHOL, HDL, LDLCALC, TRIG, CHOLHDL,  in the last 72 hours  Recent Labs  03/20/12 0425  TSH 1.672   No results found for this basename: VITAMINB12, FOLATE, FERRITIN, TIBC, IRON, RETICCTPCT,  in the last 72 hours   Other results:  Tele:  NSR  Medications:    Infusions:    Scheduled Medications: . aspirin  81 mg Oral Daily  . atorvastatin  40 mg Oral Daily  . buPROPion  300 mg Oral Daily  . enoxaparin (LOVENOX) injection  40 mg Subcutaneous Q24H  . folic acid  1 mg Oral Daily  . metoprolol succinate  25 mg Oral Daily  . multivitamin with minerals  1 tablet Oral Daily  . nicotine  7 mg Transdermal Q24H  . nitroGLYCERIN  0.5 inch Topical Q6H  . QUEtiapine  150 mg Oral QHS  . sodium chloride  3 mL Intravenous Q12H  . thiamine  100 mg Oral Daily   Or  . thiamine  100 mg Intravenous Daily    Assessment/ Plan:      Chest pain at rest - noncardiac, no further evaluation needed.  Will sign off.  Active Problems:   CAD (coronary artery disease)   HTN (hypertension)   Hyperlipidemia   Polysubstance abuse   Precordial pain   Plans per internal medicine team.   Length of Stay: 2  Vesta Mixer, Montez Hageman., MD, [facility] 03/21/2012, 7:19 AM Office [phone] Pager [phone]

## 2012-03-21 NOTE — Progress Notes (Signed)
Clinical Social Work  CSW spoke with [facility] who reports bed is ready but requests that patient be sent around 1730. RN aware of plans and agreeable to security transport at that time. CSW coordinated transportation and is signing off but available if further needs arise.  [place], [place] [phone]

## 2012-03-21 NOTE — Progress Notes (Signed)
Clinical Social Work Department CLINICAL SOCIAL WORK PSYCHIATRY SERVICE LINE ASSESSMENT 03/21/2012  Patient:  [name]  Account:  [id number]  Admit Date:  03/19/2012  Clinical Social Worker:  Unk Lightning, LCSW  Date/Time:  03/21/2012 12:00 N Referred by:  Physician  Date referred:  03/21/2012 Reason for Referral  Behavioral Health Issues   Presenting Symptoms/Problems (In the person's/family's own words):   Psych consulted for substance abuse and bipolar.   Abuse/Neglect/Trauma History (check all that apply)  Denies history   Abuse/Neglect/Trauma Comments:   Psychiatric History (check all that apply)  Outpatient treatment  Inpatient/hospitilization   Psychiatric medications:  Wellbutrin 300 mg  Seroquel XR 200 mg   Current Mental Health Hospitalizations/Previous Mental Health History:   Patient was diagnosed with bipolar disorder in 2007. Patient reports he felt depressed prior to 2007 but was not formally diagnosed until then. Patient receives medication management for bipolar medications.   Current provider:   Vickii Penna and Date:   [place], [place]   Current Medications:   HYDROcodone-acetaminophen, LORazepam, LORazepam, LORazepam, ondansetron (ZOFRAN) IV, ondansetron, temazepam            . aspirin  81 mg Oral Daily  . atorvastatin  40 mg Oral Daily  . buPROPion  300 mg Oral Daily  . enoxaparin (LOVENOX) injection  40 mg Subcutaneous Q24H  . folic acid  1 mg Oral Daily  . metoprolol succinate  25 mg Oral Daily  . multivitamin with minerals  1 tablet Oral Daily  . nicotine  7 mg Transdermal Q24H  . nitroGLYCERIN  0.5 inch Topical Q6H  . QUEtiapine  150 mg Oral QHS  . sodium chloride  3 mL Intravenous Q12H  . thiamine  100 mg Oral Daily   Previous Impatient Admission/Date/Reason:   Patient reports he went to [facility] about 15 years ago and went to [facility] about 10 years ago. Patient went to [facility] in July 2013 and [facility] August 2013 for 30 days. All admissions were  related to substance abuse or depression.   Emotional Health / Current Symptoms    Suicide/Self Harm  None reported   Suicide attempt in the past:   Patient denies SI and HI.   Other harmful behavior:   Psychotic/Dissociative Symptoms  None reported   Other Psychotic/Dissociative Symptoms:    Attention/Behavioral Symptoms  Within Normal Limits   Other Attention / Behavioral Symptoms:    Cognitive Impairment  Within Normal Limits   Other Cognitive Impairment:    Mood and Adjustment  Mood Congruent    Stress, Anxiety, Trauma, Any Recent Loss/Stressor  Other - See comment   Anxiety (frequency):   Phobia (specify):   Compulsive behavior (specify):   Obsessive behavior (specify):   Other:   Patient reports that he has lived with same roommate for the past 4 years but does not feel it is a good environment to stay sober and is currently looking for alternative housing options.   Substance Abuse/Use  Current substance use  Substance abuse treatment needed   SBIRT completed (please refer for detailed history):  Y  Self-reported substance use:   Patient reports he drank alcohol on 03/19/12 around 9pm. Patient reports he has been drinking alcohol for the past month due to pain and not having pain medication. Patient interested in treatment.   Urinary Drug Screen Completed:  Y Alcohol level:   BAL 58    Environmental/Housing/Living Arrangement  Stable housing   Who is in the home:   Roommate  Emergency contact:  Matt-roommate   Financial  Private Insurance  Medicare   Patient's Strengths and Goals (patient's own words):   Patient reports he is goal oriented and a good cook. Patient lives to paint and help take care of others.   Clinical Social Worker's Interpretive Summary:   CSW received referral to complete psychosocial assessment. CSW reviewed chart and spoke with psych MD regarding recommendations. CSW met with patient at bedside and visitor stepped out  of room for confidentiality purposes.    CSW introduced myself and explained role. Patient reports that he was admitted due to chest pain. 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 Patient had hip replacement any was prescribed pain medication. Patient did not take medication as prescribed and ran out of medication. Patient begun drinking alcohol due to the pain.    Patient agreeable to discuss substance abuse in the past. Patient has been drinking for several years and has 3 DUIs. Patient received latest charge last year and was informed by lawyer that he should go to detox. Patient was admitted to [facility] for about 5 days and then went to [facility] 30 day rehab. Patient was sober from August 2013-January 2014. Patient has also been at other facilities for substance abuse. Patient desires placement again in order to become sober. Patient feels that since using alcohol again that he feels more depressed.    Patient was diagnosed with bipolar disorder in 2007. Patient's mother had bipolar and patient felt he had similar symptoms. Patient has been going to [facility] every 3 months for medication management. Patient reports that his medication is helpful and is compliant with treatment.    Patient is on disability and lives with roommate for the past four years. Roommate wants to be in a relationship with patient but patient does not feel he and roommate are compatible. Patient wants to find another place to live because roommate drinks and patient feels he cannot remain sober living with him. Patient's mother has passed away but patient has contact with father and brothers.    Patient appropriate and pleasant throughout assessment. Patient engaged and maintained eye contact. Patient is compliant with MH treatment and is seeking treatment for substance abuse. Patient has good insight and it able to reflect on triggers for using. Patient agreeable to change environment and seek treatment  in order to maintain sober. CSW explained process of finding treatment and patient agreeable.    CSW faxed referral to [facility] to determine if patient would be eligible for treatment. CSW will continue to follow.   Disposition:  Inpatient referral made [facility], [facility], [place])

## 2012-03-21 NOTE — Progress Notes (Signed)
RN attempted to call report to [facility] for transfer of pt. RN spoke with Minerva Areola, [facility] who states [facility] "cannot accept pt at this time due to drop in hemoglobin." MD notified. MD order received to observe pt overnight to observe for any signs of bleeding. SW on call, Augusto Gamble, notified. No evidence of bleeding at this time. Pt VSS. Will continue to monitor.   Efraim Kaufmann

## 2012-03-21 NOTE — Progress Notes (Signed)
Clinical Social Work  Patient accepted to [facility] bed 306-2. Eric from [facility] reports he will contact CSW after room is cleaned. CSW informed MD who will complete dc summary. CSW informed patient who signed voluntary form. CSW faxed form to [facility] and placed original copy in chart. RN informed of dc plans. CSW will continue to follow and will coordinate dc to [facility] when facility ready.  [place], [place] [phone]

## 2012-03-21 NOTE — Discharge Summary (Signed)
Physician Discharge Summary  Patient ID: MICAJAH DENNIN MRN: [id number] DOB/AGE: March 14, 1950 62 y.o.  Admit date: 03/19/2012 Discharge date: 03/21/2012  Primary Care Physician:  Provider Not In System  Discharge Diagnoses:    . CAD (coronary artery disease) . HTN (hypertension) . Hyperlipidemia . Polysubstance abuse . Chest pain at rest . Precordial pain . Alcohol dependence  Consults: Cardiology                   Psychiatry, Dr. Ferol Luz   Discharge Instructions:   Discharge Medications:   Medication List    TAKE these medications       aspirin EC 325 MG tablet  Take 325 mg by mouth every morning.     atorvastatin 40 MG tablet  Commonly known as:  LIPITOR  Take 40 mg by mouth daily.     buPROPion 300 MG 24 hr tablet  Commonly known as:  WELLBUTRIN XL  Take 300 mg by mouth daily.     ibuprofen 200 MG tablet  Commonly known as:  ADVIL,MOTRIN  Take 800 mg by mouth every 8 (eight) hours as needed for pain.     metoprolol succinate 25 MG 24 hr tablet  Commonly known as:  TOPROL-XL  Take 25 mg by mouth daily.     SEROQUEL XR 50 MG Tb24  Generic drug:  QUEtiapine  Take 150 mg by mouth at bedtime. Take 3 tablets (150 mg total) at bed time         Brief H and P: For complete details please refer to admission H and P, but in brief, Patient is a 62 y.o. male with a PMHx of anxiety, depression, hypertension, coronary artery disease status post PCI in 2007, hyperlipidemia, bipolar disorder, who presents to [facility] for evaluation of acute onset chest pain that began at 3 PM on the day of admission while the patient was resting. Pain is described as a heaviness chest pain located over mid chest. The pain was rated as a 10/10 in severity at onset and currently rated 4/10, denied associated diaphoresis, nausea, near syncope, palpitations, shortness of breath and syncope. For the pain, the patient tried nothing for pain relief. Patient also mentioned that the main reason that he came to  the ER was that he wants to be admitted to behavioral health for detox. He said that he finished his Percocet and Xanax pills yesterday and since then has been drinking alcohol to get rid of the withdrawal symptoms.  Otherwise, the patient denied diaphoresis, nausea, near syncope, palpitations, shortness of breath and syncope   Hospital Course:  Patient is a 62 year old male with history of coronary artery disease, hypertension, hyperlipidemia, mitral valve prolapse who was admitted with chest pain rule out. Chest pain at rest: Atypical for 3 days, resolved, Known history of coronary artery ds, risk factors of hypertension, hyperlipidemia, nicotine abuse. He was ruled out for acute ACS, cardiac enzymes remained negative so far. [facility] cardiology was consulted and patient was continued on aspirin, beta blocker and statin therapy. Patient had Normal lexiscan within last year, no further ischemia w/u was recommended by cardiology at this time. Patient was placed on atorvastatin and should be continued upon discharge from [facility].   HTN (hypertension) stable   Hyperlipidemia: Continue atorvastatin   Polysubstance abuse with alcohol abuse; Placed on CIWA scale for alcohol,so far stable. He did not have any acute withdrawal symptoms. Psychiatry consultation was obtained due to patient's request for detox. He is currently medically stable and cleared  from cardiology standpoint.   Current inpatient medications to be continued at [facility] :  Aspirin 81 mg daily Lipitor 40 mg daily Toprol XL 25 mg daily Nicotine patch 7 mg every 24 hours    Day of Discharge BP 104/62  Pulse 88  Temp(Src) 97.9 F (36.6 C) (Axillary)  Resp 20  Ht 5\' 10"  (1.778 m)  Wt 83.734 kg (184 lb 9.6 oz)  BMI 26.49 kg/m2  SpO2 95%  Physical Exam: General: Alert and awake oriented x3 not in any acute distress. HEENT: anicteric sclera, pupils reactive to light and accommodation CVS: S1-S2 clear no murmur rubs or gallops Chest:  clear to auscultation bilaterally, no wheezing rales or rhonchi Abdomen: soft nontender, nondistended, normal bowel sounds, no organomegaly Extremities: no cyanosis, clubbing or edema noted bilaterally Neuro: Cranial nerves II-XII intact, no focal neurological deficits   The results of significant diagnostics from this hospitalization (including imaging, microbiology, ancillary and laboratory) are listed below for reference.    LAB RESULTS: Basic Metabolic Panel:  Recent Labs Lab 03/19/12 2313 03/20/12 0425  NA 138 134*  K 3.5 3.2*  CL 104 99  CO2  --  22  GLUCOSE 93 99  BUN <3* 6  CREATININE 0.70 0.68  CALCIUM  --  9.5   Liver Function Tests:  Recent Labs Lab 03/19/12 2347  AST 21  ALT 15  ALKPHOS 103  BILITOT 0.2*  PROT 7.8  ALBUMIN 4.3    Recent Labs Lab 03/19/12 2347  LIPASE 102*   CBC:  Recent Labs Lab 03/19/12 2347 03/20/12 0425  WBC 8.2 7.5  NEUTROABS 4.7  --   HGB 11.7* 10.9*  HCT 36.2* 33.9*  MCV 79.0 79.2  PLT 484* 463*   Cardiac Enzymes:  Recent Labs Lab 03/20/12 0425 03/20/12 0951  TROPONINI <0.30 <0.30   BNP: No components found with this basename: POCBNP,  CBG: No results found for this basename: GLUCAP,  in the last 168 hours  Significant Diagnostic Studies:  Dg Chest 2 View  03/20/2012  *RADIOLOGY REPORT*  Clinical Data: Chest pain  CHEST - 2 VIEW  Comparison: 10/27/2011 and prior chest radiographs  Findings: The cardiomediastinal silhouette is unremarkable. The lungs are clear. There is no evidence of focal airspace disease, pulmonary edema, suspicious pulmonary nodule/mass, pleural effusion, or pneumothorax. No acute bony abnormalities are identified. A remote upper thoracic compression fracture is again noted.  IMPRESSION: No evidence of acute cardiopulmonary disease.   Original Report Authenticated By: Harmon Pier, M.D.     Disposition and Follow-up:   DISPOSITION: [facility]  DIET: Heart healthy diet  ACTIVITY: As  tolerated   DISCHARGE FOLLOW-UP   Time spent on Discharge: 35 mins  Signed:   Hjalmer Iovino M.D. [facility] 03/21/2012, 2:50 PM Pager: [phone]

## 2012-03-22 ENCOUNTER — Encounter (HOSPITAL_COMMUNITY): Payer: Self-pay | Admitting: Intensive Care

## 2012-03-22 ENCOUNTER — Inpatient Hospital Stay (HOSPITAL_COMMUNITY)
Admission: AD | Admit: 2012-03-22 | Discharge: 2012-03-29 | DRG: 897 | Disposition: A | Discharge status: Home / Self Care | Payer: Medicare Other | Source: Intra-hospital | Attending: Psychiatry | Admitting: Psychiatry

## 2012-03-22 DIAGNOSIS — D649 Anemia, unspecified: Secondary | ICD-10-CM | POA: Diagnosis present

## 2012-03-22 DIAGNOSIS — I10 Essential (primary) hypertension: Secondary | ICD-10-CM | POA: Diagnosis present

## 2012-03-22 DIAGNOSIS — I2510 Atherosclerotic heart disease of native coronary artery without angina pectoris: Secondary | ICD-10-CM | POA: Diagnosis present

## 2012-03-22 DIAGNOSIS — F1910 Other psychoactive substance abuse, uncomplicated: Secondary | ICD-10-CM | POA: Diagnosis present

## 2012-03-22 DIAGNOSIS — F1020 Alcohol dependence, uncomplicated: Principal | ICD-10-CM | POA: Diagnosis present

## 2012-03-22 DIAGNOSIS — Z79899 Other long term (current) drug therapy: Secondary | ICD-10-CM

## 2012-03-22 LAB — FERRITIN: Ferritin: 14 ng/mL — ABNORMAL LOW (ref 22–322)

## 2012-03-22 LAB — CBC
HCT: 31 % — ABNORMAL LOW (ref 39.0–52.0)
Hemoglobin: 9.6 g/dL — ABNORMAL LOW (ref 13.0–17.0)
MCH: 25.4 pg — ABNORMAL LOW (ref 26.0–34.0)
MCV: 82 fL (ref 78.0–100.0)
Platelets: 362 10*3/uL (ref 150–400)
RBC: 3.78 MIL/uL — ABNORMAL LOW (ref 4.22–5.81)

## 2012-03-22 LAB — IRON AND TIBC
Iron: 17 ug/dL — ABNORMAL LOW (ref 42–135)
Saturation Ratios: 4 % — ABNORMAL LOW (ref 20–55)
TIBC: 409 ug/dL (ref 215–435)
UIBC: 392 ug/dL (ref 125–400)

## 2012-03-22 LAB — RETICULOCYTES: Retic Ct Pct: 1.2 % (ref 0.4–3.1)

## 2012-03-22 MED ORDER — MAGNESIUM HYDROXIDE 400 MG/5ML PO SUSP
30.0000 mL | Freq: Every day | ORAL | Status: DC | PRN
  Administered 2012-03-23 – 2012-03-24 (×2): 30 mL via ORAL

## 2012-03-22 MED ORDER — METOPROLOL SUCCINATE ER 25 MG PO TB24
25.0000 mg | ORAL_TABLET | Freq: Every day | ORAL | Status: DC
  Administered 2012-03-23 – 2012-03-29 (×7): 25 mg via ORAL
  Filled 2012-03-22 (×11): qty 1

## 2012-03-22 MED ORDER — FLEET ENEMA 7-19 GM/118ML RE ENEM
1.0000 | ENEMA | Freq: Once | RECTAL | Status: DC
  Filled 2012-03-22: qty 1

## 2012-03-22 MED ORDER — ASPIRIN EC 325 MG PO TBEC
325.0000 mg | DELAYED_RELEASE_TABLET | Freq: Every morning | ORAL | Status: DC
  Administered 2012-03-23 – 2012-03-27 (×5): 325 mg via ORAL
  Filled 2012-03-22 (×7): qty 1

## 2012-03-22 MED ORDER — CHLORDIAZEPOXIDE HCL 25 MG PO CAPS
25.0000 mg | ORAL_CAPSULE | Freq: Four times a day (QID) | ORAL | Status: AC | PRN
  Administered 2012-03-22 – 2012-03-24 (×7): 25 mg via ORAL
  Filled 2012-03-22 (×7): qty 1

## 2012-03-22 MED ORDER — NICOTINE 7 MG/24HR TD PT24
7.0000 mg | MEDICATED_PATCH | Freq: Every day | TRANSDERMAL | Status: DC
  Administered 2012-03-22 – 2012-03-29 (×6): 7 mg via TRANSDERMAL
  Filled 2012-03-22 (×11): qty 1

## 2012-03-22 MED ORDER — ACETAMINOPHEN 325 MG PO TABS
650.0000 mg | ORAL_TABLET | Freq: Four times a day (QID) | ORAL | Status: DC | PRN
  Administered 2012-03-22 – 2012-03-26 (×10): 650 mg via ORAL
  Filled 2012-03-22: qty 1

## 2012-03-22 MED ORDER — ATORVASTATIN CALCIUM 40 MG PO TABS
40.0000 mg | ORAL_TABLET | Freq: Every day | ORAL | Status: DC
  Administered 2012-03-23 – 2012-03-29 (×7): 40 mg via ORAL
  Filled 2012-03-22 (×10): qty 1

## 2012-03-22 MED ORDER — PANTOPRAZOLE SODIUM 40 MG PO TBEC
40.0000 mg | DELAYED_RELEASE_TABLET | Freq: Every day | ORAL | Status: DC
  Administered 2012-03-22: 40 mg via ORAL
  Filled 2012-03-22: qty 1

## 2012-03-22 MED ORDER — ALUM & MAG HYDROXIDE-SIMETH 200-200-20 MG/5ML PO SUSP: 30.0000 mL | ORAL | Status: DC | PRN

## 2012-03-22 MED ORDER — QUETIAPINE FUMARATE ER 50 MG PO TB24
150.0000 mg | ORAL_TABLET | Freq: Every day | ORAL | Status: DC
  Administered 2012-03-22 – 2012-03-28 (×7): 150 mg via ORAL
  Filled 2012-03-22 (×10): qty 3

## 2012-03-22 MED ORDER — BUPROPION HCL ER (XL) 300 MG PO TB24
300.0000 mg | ORAL_TABLET | Freq: Every day | ORAL | Status: DC
  Administered 2012-03-23 – 2012-03-29 (×7): 300 mg via ORAL
  Filled 2012-03-22 (×11): qty 1

## 2012-03-22 NOTE — Progress Notes (Signed)
Patient ID: [name], male   DOB: 1950/09/05, 62 y.o.   MRN: [id number] Patient admitted voluntarily for help with ETOH abuse. Patient reports having a left hip replacement six weeks ago. He reports running out of the pain medications that were provided and began drinking wine to deal with the pain. Patient stated "I was laying in my right hip crying in pain it was so bad. I need to stop drinking and get my pain under control. I am very open to suggestions." Patient very cooperative with the admission process and has been a patient at [facility] before. Denies any SI/HI/AVH. Demonstrates a good sense of humor. Patient was oriented to the 300 hall unit and routine.

## 2012-03-22 NOTE — Progress Notes (Addendum)
Patient ID: [name]  male  ZOX:[id number]    DOB: Jul 04, 1950    DOA: 03/19/2012  PCP: Provider Not In System    Assessment/Plan: Principal Problem:   Chest pain at rest: Known history of coronary artery ds, risk factors of hypertension, hyperlipidemia, nicotine abuse - Cardiac enzymes negative so far - continue aspirin, beta blocker, statin therapy,  - Normal lexiscan 12/13, no further ischemia w/u at this time, cardiology signed off  Active Problems:   HTN (hypertension) stable    Hyperlipidemia: cont statin    Polysubstance abuse with alcohol abuse - Placed on CIWA scale for alcohol,so far stable - Psychiatry consultation obtained due to patient's request for detox.  Anemia : Asymptomatic, normocytic, patient is not actively bleeding, no hematemesis. Anemia panel has been ordered and still pending, stool occult test was ordered however patient had no bowel movement. He is ambulating without any difficulty, no chest pain or any shortness of breath or dizziness. I have discussed in detail with psychiatry (Dr Dub Mikes), will follow up on the anemia panel and stool occult test.    DVT Prophylaxis: SCD's  Code Status: FC  Disposition: DC to [facility] today   Subjective: Feeling better, no CP, SOB, anxious  Objective: Weight change: 2.087 kg (4 lb 9.6 oz)  Intake/Output Summary (Last 24 hours) at 03/22/12 1415 Last data filed at 03/21/12 1700  Gross per 24 hour  Intake    240 ml  Output    200 ml  Net     40 ml   Blood pressure 110/74, pulse 74, temperature 97.6 F (36.4 C), temperature source Oral, resp. rate 18, height 5\' 10"  (1.778 m), weight 85.821 kg (189 lb 3.2 oz), SpO2 97.00%.  Physical Exam: General: Alert and awake, oriented x3, not in any acute distress. CVS: S1-S2 clear, no murmur rubs or gallops Chest: CTAB Abdomen: soft nontender, nondistended, normal bowel sounds Extremities: no cyanosis, clubbing or edema noted bilaterally   Lab Results: Basic Metabolic  Panel:  Recent Labs Lab 03/20/12 0425 03/21/12 1635  NA 134* 136  K 3.2* 3.8  CL 99 102  CO2 22 22  GLUCOSE 99 136*  BUN 6 13  CREATININE 0.68 0.92  CALCIUM 9.5 9.4   Liver Function Tests:  Recent Labs Lab 03/19/12 2347  AST 21  ALT 15  ALKPHOS 103  BILITOT 0.2*  PROT 7.8  ALBUMIN 4.3    Recent Labs Lab 03/19/12 2347  LIPASE 102*   No results found for this basename: AMMONIA,  in the last 168 hours CBC:  Recent Labs Lab 03/19/12 2347  03/21/12 1635 03/22/12 0945  WBC 8.2  < > 5.7 5.2  NEUTROABS 4.7  --   --   --   HGB 11.7*  < > 9.8* 9.6*  HCT 36.2*  < > 31.0* 31.0*  MCV 79.0  < > 80.9 82.0  PLT 484*  < > 374 362  < > = values in this interval not displayed. Cardiac Enzymes:  Recent Labs Lab 03/20/12 0425 03/20/12 0951  TROPONINI <0.30 <0.30   BNP: No components found with this basename: POCBNP,  CBG: No results found for this basename: GLUCAP,  in the last 168 hours   Micro Results: No results found for this or any previous visit (from the past 240 hour(s)).  Studies/Results: Dg Chest 2 View  03/20/2012  *RADIOLOGY REPORT*  Clinical Data: Chest pain  CHEST - 2 VIEW  Comparison: 10/27/2011 and prior chest radiographs  Findings: The cardiomediastinal  silhouette is unremarkable. The lungs are clear. There is no evidence of focal airspace disease, pulmonary edema, suspicious pulmonary nodule/mass, pleural effusion, or pneumothorax. No acute bony abnormalities are identified. A remote upper thoracic compression fracture is again noted.  IMPRESSION: No evidence of acute cardiopulmonary disease.   Original Report Authenticated By: Harmon Pier, M.D.     Medications: Scheduled Meds: . aspirin  81 mg Oral Daily  . atorvastatin  40 mg Oral Daily  . buPROPion  300 mg Oral Daily  . folic acid  1 mg Oral Daily  . metoprolol succinate  25 mg Oral Daily  . multivitamin with minerals  1 tablet Oral Daily  . nicotine  7 mg Transdermal Q24H  . pantoprazole   40 mg Oral Daily  . QUEtiapine  150 mg Oral QHS  . sodium chloride  3 mL Intravenous Q12H  . sodium phosphate  1 enema Rectal Once  . thiamine  100 mg Oral Daily      LOS: 3 days   Carder Yin M.D. [facility] 03/22/2012, 2:15 PM Pager: [phone]  If 7PM-7AM, please contact night-coverage [email or web address] Password [email or web address]

## 2012-03-22 NOTE — Progress Notes (Signed)
Clinical Social Work  CSW staffed case with psych MD who agrees to call [facility] and inquire regarding admission. Psych MD to call CSW with final determination. CSW will continue to follow.  [place], [place] [phone]

## 2012-03-22 NOTE — Consult Note (Addendum)
Patient Identification:  [name] Date of Evaluation:  03/22/2012 Reason for Consult:  Referring Provider: Pt says he has been drinking since hsi 20s. He had just had a reconstruction of prior hip replacement and was taking a variety of narcotics Rx for pain. His refills were declined so he decided to drink alcohol. It did not alleviate the pain. He drank beer 12-15 bottles; alt with wine 2-3 bottles or bourbon a 1/5. He became depressed and had to stop drinking. He did not eat for three days. He developed chest pain and wnt to the ED  Past Psychiatric History: Pt has been drinking over many years. He has 3 DUIs and went to [facility] for 30 days to get two charges reduced, had to pay for the eird one. He has been depressed, and some phases of elated mood but never 'manic' He has been on spending sprees. He was diagnosed 2006 as Bipolar I after he had a joint replacement. He takes Wellbutrin XL 300 mg in am and Seroquel SR 200 mg at night. He is gy and has had relationships; not in one now. He has an older brother who vanished after excessive alcoholism 15-20 bottles/day for years, a younger brother who drinks socially and may smoke cannabis without functional disturbance. His mother had Bipolar I DO and committed suicide while in an institution   Past Medical History:     Past Medical History  Diagnosis Date  . MVP (mitral valve prolapse)   . Anxiety   . Hypertension   . Coronary artery disease     s/p PCI to RCA in 2007  . Hyperlipidemia   . Palpitations   . Bipolar disorder   . Depression   . Esophagitis   . Gastritis   . Insomnia   . Seizures   . Pneumonia 2005  . Arthritis        Past Surgical History  Procedure Laterality Date  . Coronary angioplasty  2007    RCA  . Cardiac catheterization  04/2008    EF 65%  . Joint replacement    . Knee arthroscopy, medial patello femoral ligament repair    . Hip arthroplasty  bilateral   . Shoulder arthroscopy  right  . Tonsillectomy     . Total hip revision  02/07/2012    Procedure: TOTAL HIP REVISION;  Surgeon: [name], MD;  Location: [facility] OR;  Service: Orthopedics;  Laterality: Left;  [place] Hip Revision Set    Allergies:  Allergies  Allergen Reactions  . Ambien (Zolpidem Tartrate) Other (See Comments)    Sleep Walking  . Codeine Itching    Current Medications:  Prior to Admission medications   Medication Sig Start Date End Date Taking? Authorizing Provider  aspirin EC 325 MG tablet Take 325 mg by mouth every morning.   Yes Historical Provider, MD  atorvastatin (LIPITOR) 40 MG tablet Take 40 mg by mouth daily.   Yes Peter M Swaziland, MD  buPROPion (WELLBUTRIN XL) 300 MG 24 hr tablet Take 300 mg by mouth daily.    Yes Verne Spurr, PA-C  ibuprofen (ADVIL,MOTRIN) 200 MG tablet Take 800 mg by mouth every 8 (eight) hours as needed for pain.   Yes Historical Provider, MD  metoprolol succinate (TOPROL-XL) 25 MG 24 hr tablet Take 25 mg by mouth daily.   Yes Peter M Swaziland, MD  QUEtiapine (SEROQUEL XR) 50 MG TB24 Take 150 mg by mouth at bedtime. Take 3 tablets (150 mg total) at bed time  Yes Historical Provider, MD    Social History:    reports that he quit smoking about 5 months ago. He does not have any smokeless tobacco history on file. He reports that  drinks alcohol. He reports that he uses illicit drugs (Benzodiazepines) about 7 times per week.   Family History:    Family History  Problem Relation Age of Onset  . Heart disease Neg Hx   . Prostate cancer Father     Mental Status Examination/Evaluation: Objective:  Appearance: Casual  Eye Contact::  Good  Speech:  Clear and Coherent  Volume:  Normal  Mood:  Aprehensive, tremors   Affect:  Appropriate and Congruent  Thought Process:  Coherent, Goal Directed and Intact  Orientation:  Full (Time, Place, and Person)  Thought Content:  Rumination  Suicidal Thoughts:  No  Homicidal Thoughts:  No  Judgement:  Fair  Insight:  Fair   DIAGNOSIS:   AXIS  I  Alcohol Dependence, Bipolar I Disorder, Opioid Abuse  AXIS II  Deferred  AXIS III See medical notes.  AXIS IV housing problems, other psychosocial or environmental problems, problems related to social environment and roommate drinks too much; he wants to move to a more positive environment  AXIS V 41-50 serious symptoms   Assessment/Plan:  Discussed with Dr. Elder Negus, Psych CSW X2, [facility] Assessment [facility] reportedly accepted this pt and then questioned HCT which has been consistently low.  All hemodynamic parameters have been evaluated.  Internal Medicine and  Cardiology have not been concerned.  The outlying hemoccult test has not been sent; the measure to use enema to obtain stool sample is an extraordinary measure at this time and result would not be reported until Mon. or Tues.  This is not cost efficient.  Options to refer pt to other facilities to reinforce his abstinence are also a delay of several days minimum to be admitted.  Dr. Isidoro Donning, internal medicine and cardiology say he is medically stable and wishes to discharge him.  He says he has been drinking so heavily, he is apprehensive about being at home, drinking and using pills again.  He says he only has option to ask roommate to lock up his alcohol, throw away all remaining medications; then return to [facility] on Monday for admission for depression and rehab.  He knows that starting drinking again is self destructive but he has been drinking for so long [+++ family history of alcohol dependence] that he doubts he could stop on his own.   He has no overt suicidal ideation, no psychotic symptoms but is at risk if no in a supervised therapeutic program. He is very depressed and anxious. **call from [facility] Assessment:  Pt has been admitted to [facility]; transfer in process.  RECOMMENDATION:  1.  Pt has capacity 2.  Continue Wellbutrin SL 300 mg in am; Seroquel SR 200 mg at night 3.  Discussed with Psych CSW- she is researching programs for direct transfer or as  soon as possible; She plans to consult with Dr. Jacky Kindle 4.  Pt is to be discharged by Dr. Isidoro Donning unless a transfer may be identified this afternoon.  Reichen Hutzler MD 03/22/2012 10:53 AM

## 2012-03-22 NOTE — Progress Notes (Signed)
Clinical Social Work  CSW reviewed chart which stated patient did not dc yesterday due to hemoglobin drop. CSW spoke with [facility] from [facility] who had concerns yesterday. CSW addressed concerns with attending MD who reports no active bleeding and that patient remained medically stable to dc. CSW discussed case with Minerva Areola who then accepted patient on 03/21/12 at 1500. At 1847, RN wrote a note reporting that MD would observe patient overnight per [facility] request. CSW spoke with attending MD this morning who reports that patient is medically stable and no further treatment for hemoglobin. CSW spoke with Fannie Knee at [facility] who reports that she will share information with admitting team and will contact CSW with determination. CSW will continue to follow.  [place], [place] [phone]

## 2012-03-22 NOTE — Progress Notes (Signed)
Clinical Social Work  CSW spoke with [facility] who accepted patient to bed 301-1 and [place] [phone]. CSW informed patient who is agreeable to dc. CSW coordinated transportation via security. CSW is signing off.  [place], [place] [phone]

## 2012-03-22 NOTE — Progress Notes (Signed)
Clinical Social Work  Charity fundraiser called CSW and reports that after calling report, [facility] reports several admissions and asked for patient to dc at later time tonight. RN reports that [facility] will call him ready to dc. CSW here until 1630 and 2nd shift CSW (Jody [phone]) can be contacted if dc occurs after that time. CSW will continue to follow.  [place], [place] [phone]

## 2012-03-22 NOTE — Progress Notes (Signed)
Adult Psychoeducational Group Note  Date:  03/22/2012 Time:  11:21 PM  Group Topic/Focus:  AA group  Participation Level:  Active  Participation Quality:  Appropriate  Affect:  Appropriate  Cognitive:  Alert  Insight: Appropriate  Engagement in Group:  Engaged  Modes of Intervention:  Discussion  Additional Comments:    Flonnie Hailstone 03/22/2012, 11:21 PM

## 2012-03-22 NOTE — Progress Notes (Signed)
Clinical Social Work  CSW spoke with Dr. Jacky Kindle who reports he spoke with [facility] Dr. Jacky Kindle requests that attending MD contact Dr. Dub Mikes at [facility]. CSW provided attending MD with information who is agreeable to call Dr. Dub Mikes. CSW will continue to follow.  [place], [place] [phone]

## 2012-03-22 NOTE — Tx Team (Signed)
Initial Interdisciplinary Treatment Plan  PATIENT STRENGTHS: (choose at least two) Ability for insight Active sense of humor Average or above average intelligence Capable of independent living Communication skills General fund of knowledge Motivation for treatment/growth  PATIENT STRESSORS: Health problems Substance abuse   PROBLEM LIST: Problem List/Patient Goals Date to be addressed Date deferred Reason deferred Estimated date of resolution   ETOH abuse  03/22/12      Depression       Chronic pain                                           DISCHARGE CRITERIA:  Ability to meet basic life and health needs Adequate post-discharge living arrangements Improved stabilization in mood, thinking, and/or behavior Medical problems require only outpatient monitoring Motivation to continue treatment in a less acute level of care Need for constant or close observation no longer present Reduction of life-threatening or endangering symptoms to within safe limits Safe-care adequate arrangements made Verbal commitment to aftercare and medication compliance  PRELIMINARY DISCHARGE PLAN: Return to previous work or school arrangements  PATIENT/FAMIILY INVOLVEMENT: This treatment plan has been presented to and reviewed with the patient, [name], and/or family member.  The patient and family have been given the opportunity to ask questions and make suggestions.  Fransisca Kaufmann ANN 03/22/2012, 9:02 PM

## 2012-03-22 NOTE — Discharge Summary (Signed)
Physician Discharge Summary  Patient ID: [name] MRN: [id number] DOB/AGE: 08/04/1950 62 y.o.  Admit date: 03/19/2012 Discharge date: 03/22/2012  Primary Care Physician:  Provider Not In System  Primary cardiologist: Dr. Peter Swaziland [facility])  Discharge Diagnoses:    . CAD (coronary artery disease) . HTN (hypertension) . Hyperlipidemia . Polysubstance abuse . Chest pain at rest . Precordial pain . Alcohol dependence . Anemia  Consults:  Cardiology  Psychiatry, Dr. Ferol Luz   Discharge Instructions: 1) Stop Ibuprofen 2) add protonix to his DC meds   Current inpatient medications to be continued at [facility] :  Aspirin 81 mg daily  Lipitor 40 mg daily  Toprol XL 25 mg daily  Nicotine patch 7 mg every 24 hours Protonic 40mg  daily  Discharge Medications:   Medication List    STOP taking these medications       ibuprofen 200 MG tablet  Commonly known as:  ADVIL,MOTRIN      TAKE these medications       aspirin EC 325 MG tablet  Take 325 mg by mouth every morning.     atorvastatin 40 MG tablet  Commonly known as:  LIPITOR  Take 40 mg by mouth daily.     buPROPion 300 MG 24 hr tablet  Commonly known as:  WELLBUTRIN XL  Take 300 mg by mouth daily.     metoprolol succinate 25 MG 24 hr tablet  Commonly known as:  TOPROL-XL  Take 25 mg by mouth daily.     SEROQUEL XR 50 MG Tb24  Generic drug:  QUEtiapine  Take 150 mg by mouth at bedtime. Take 3 tablets (150 mg total) at bed time         Brief H and P: For complete details please refer to admission H and P, but in briefFor complete details please refer to admission H and P, but in brief, Patient is a 62 y.o. male with a PMHx of anxiety, depression, hypertension, coronary artery disease status post PCI in 2007, hyperlipidemia, bipolar disorder, who presents to [facility] for evaluation of acute onset chest pain that began at 3 PM on the day of admission while the patient was resting. Pain is described as a heaviness chest  pain located over mid chest. The pain was rated as a 10/10 in severity at onset and currently rated 4/10, denied associated diaphoresis, nausea, near syncope, palpitations, shortness of breath and syncope. For the pain, the patient tried nothing for pain relief. Patient also mentioned that the main reason that he came to the ER was that he wants to be admitted to behavioral health for detox. He said that he finished his Percocet and Xanax pills yesterday and since then has been drinking alcohol to get rid of the withdrawal symptoms.  Otherwise, the patient denied diaphoresis, nausea, near syncope, palpitations, shortness of breath and syncope   Hospital Course:    Patient is a 62 year old male with history of coronary artery disease, hypertension, hyperlipidemia, mitral valve prolapse who was admitted with chest pain rule out.   Chest pain at rest: Atypical for 3 days, resolved, Known history of coronary artery ds, risk factors of hypertension, hyperlipidemia, nicotine abuse. He was ruled out for acute ACS, cardiac enzymes remained negative so far. [facility] cardiology was consulted and patient was continued on aspirin, beta blocker and statin therapy. Patient had Normal lexiscan within last year 12/2011, no further ischemia w/u was recommended by cardiology at this time. Patient was placed on atorvastatin and should be continued  upon discharge from [facility].   HTN (hypertension) stable   Hyperlipidemia: Continue atorvastatin   Polysubstance abuse with alcohol abuse; Placed on CIWA scale for alcohol,so far stable. He did not have any acute withdrawal symptoms. Psychiatry consultation was obtained due to patient's request for detox. He is currently medically stable and cleared from cardiology standpoint.   Anemia : Asymptomatic, normocytic, patient is not actively bleeding, no hematemesis. Anemia panel has been ordered and still pending, stool occult test was ordered however patient had no bowel movement. He  is ambulating without any difficulty, no chest pain or any shortness of breath or dizziness. I have discussed in detail with psychiatry (Dr Dub Mikes), will follow up on the anemia panel and stool occult test.    01/02/12 Myocardial perfusion study Overall Impression: Normal stress nuclear study.  LV Ejection Fraction: 71%. LV Wall Motion: NL LV Function; NL Wall Motion      Day of Discharge BP 110/74  Pulse 74  Temp(Src) 97.6 F (36.4 C) (Oral)  Resp 18  Ht 5\' 10"  (1.778 m)  Wt 85.821 kg (189 lb 3.2 oz)  BMI 27.15 kg/m2  SpO2 97%  Physical Exam: General: Alert and awake oriented x3 not in any acute distress. HEENT: anicteric sclera, pupils reactive to light and accommodation CVS: S1-S2 clear no murmur rubs or gallops Chest: clear to auscultation bilaterally, no wheezing rales or rhonchi Abdomen: soft nontender, nondistended, normal bowel sounds, no organomegaly Extremities: no cyanosis, clubbing or edema noted bilaterally Neuro: Cranial nerves II-XII intact, no focal neurological deficits   The results of significant diagnostics from this hospitalization (including imaging, microbiology, ancillary and laboratory) are listed below for reference.    LAB RESULTS: Basic Metabolic Panel:  Recent Labs Lab 03/20/12 0425 03/21/12 1635  NA 134* 136  K 3.2* 3.8  CL 99 102  CO2 22 22  GLUCOSE 99 136*  BUN 6 13  CREATININE 0.68 0.92  CALCIUM 9.5 9.4   Liver Function Tests:  Recent Labs Lab 03/19/12 2347  AST 21  ALT 15  ALKPHOS 103  BILITOT 0.2*  PROT 7.8  ALBUMIN 4.3    Recent Labs Lab 03/19/12 2347  LIPASE 102*   No results found for this basename: AMMONIA,  in the last 168 hours CBC:  Recent Labs Lab 03/19/12 2347  03/21/12 1635 03/22/12 0945  WBC 8.2  < > 5.7 5.2  NEUTROABS 4.7  --   --   --   HGB 11.7*  < > 9.8* 9.6*  HCT 36.2*  < > 31.0* 31.0*  MCV 79.0  < > 80.9 82.0  PLT 484*  < > 374 362  < > = values in this interval not displayed. Cardiac  Enzymes:  Recent Labs Lab 03/20/12 0425 03/20/12 0951  TROPONINI <0.30 <0.30   BNP: No components found with this basename: POCBNP,  CBG: No results found for this basename: GLUCAP,  in the last 168 hours  Significant Diagnostic Studies:  Dg Chest 2 View  03/20/2012  *RADIOLOGY REPORT*  Clinical Data: Chest pain  CHEST - 2 VIEW  Comparison: 10/27/2011 and prior chest radiographs  Findings: The cardiomediastinal silhouette is unremarkable. The lungs are clear. There is no evidence of focal airspace disease, pulmonary edema, suspicious pulmonary nodule/mass, pleural effusion, or pneumothorax. No acute bony abnormalities are identified. A remote upper thoracic compression fracture is again noted.  IMPRESSION: No evidence of acute cardiopulmonary disease.   Original Report Authenticated By: Harmon Pier, M.D.      Disposition and  Follow-up:    DISPOSITION: [facility]  DIET: heart healthy   ACTIVITY: as tolerated  TESTS THAT NEED FOLLOW-UP Anemia panel  DISCHARGE FOLLOW-UP F/U with Dr Swaziland in 3weeks  Time spent on Discharge: 31 mins  Signed:   Tameshia Bonneville M.D. [facility] 03/22/2012, 2:13 PM Pager: [phone]

## 2012-03-23 ENCOUNTER — Encounter (HOSPITAL_COMMUNITY): Payer: Self-pay | Admitting: Registered Nurse

## 2012-03-23 DIAGNOSIS — F1910 Other psychoactive substance abuse, uncomplicated: Secondary | ICD-10-CM

## 2012-03-23 DIAGNOSIS — F1010 Alcohol abuse, uncomplicated: Secondary | ICD-10-CM

## 2012-03-23 LAB — CBC WITH DIFFERENTIAL/PLATELET
Eosinophils Absolute: 0.8 10*3/uL — ABNORMAL HIGH (ref 0.0–0.7)
Eosinophils Relative: 11 % — ABNORMAL HIGH (ref 0–5)
Lymphs Abs: 2.8 10*3/uL (ref 0.7–4.0)
MCH: 25.4 pg — ABNORMAL LOW (ref 26.0–34.0)
MCV: 82.7 fL (ref 78.0–100.0)
Platelets: 416 10*3/uL — ABNORMAL HIGH (ref 150–400)
RBC: 4.1 MIL/uL — ABNORMAL LOW (ref 4.22–5.81)

## 2012-03-23 NOTE — [facility] Suicide Risk Assessment (Signed)
Suicide Risk Assessment  Admission Assessment     Nursing information obtained from:    Demographic factors:    Current Mental Status:    Loss Factors:    Historical Factors:    Risk Reduction Factors:     CLINICAL FACTORS:   Bipolar Disorder:   Mixed State Alcohol/Substance Abuse/Dependencies Chronic Pain Previous Psychiatric Diagnoses and Treatments Medical Diagnoses and Treatments/Surgeries  COGNITIVE FEATURES THAT CONTRIBUTE TO RISK:  Polarized thinking    SUICIDE RISK:   Mild:  Suicidal ideation of limited frequency, intensity, duration, and specificity.  There are no identifiable plans, no associated intent, mild dysphoria and related symptoms, good self-control (both objective and subjective assessment), few other risk factors, and identifiable protective factors, including available and accessible social support.  PLAN OF CARE: Patient seen and chart reviewed.  Patient is 62 year old Caucasian male who was admitted after he the last in 2 severe alcohol.  Patient endorse that he has recently had placement and he was given narcotic pain medication but when he ran out he started to use alcohol in with the pain and depression.  He endorse poor sleep irritability and depression.  He has tremors and shakes.  He seeing psychiatrist at [facility] and taking Wellbutrin XL 300 mg daily and Seroquel extended release 150 mg at bedtime.  Patient has been admitted at behavioral Center 2 years ago due to relapse into drinking.  Patient currently motivated to get treatment.  I certify that inpatient services furnished can reasonably be expected to improve the patient's condition.  Zamariah Seaborn T. 03/23/2012, 1:30 PM

## 2012-03-23 NOTE — Progress Notes (Signed)
D.  Pt pleasant and appropriate on approach.  Compliant of some left hip pain status post surgery six weeks ago, as well as constipation.  Pt states he got hooked on pain medication following his hip surgery and when he ran out of the medication he was prescribed he turned to alcohol to cope with the pain.  Pt states he knew he needed help and that is why he came in.  Pt has been interacting appropriately within milieu and was positive for evening AA group tonight.  A.  Support and encouragement offered, medications given as ordered for pain and constipation.  R.  Pt remains safe on unit, sitting in dayroom interacting with peers and watching TV.  No acute distress noted.  Will continue to monitor.

## 2012-03-23 NOTE — Progress Notes (Signed)
Adult Psychoeducational Group Note  Date:  03/23/2012 Time:  10:09 AM  Group Topic/Focus:  Self Inventory Review  Participation Level:  Active  Participation Quality:  Appropriate, Attentive and Sharing  Affect:  Appropriate  Cognitive:  Alert  Insight: Appropriate  Engagement in Group:  Engaged  Modes of Intervention:  Discussion and Support  Additional Comments:  Pt actively participated and shared during group, pt has no questions or concerns at this time.  [name] 03/23/2012, 10:09 AM

## 2012-03-23 NOTE — H&P (Signed)
Psychiatric Admission Assessment Adult  Patient Identification:  [name] Date of Evaluation:  03/23/2012 Chief Complaint:  Alcohol Dependence  Bipolar I Disorder History of Present Illness:: Patient states that he got addicted to pain killers after his hip surgery and when he was unable to get any more medication because the prescriptions had ran out and was unable to get refills he turned to alcohol and knew that he needed help Elements:  Location:  [facility] Unit. Quality:  Affects patient ability to function. Severity:  Addition. Associated Signs/Synptoms: Depression Symptoms:  depressed mood, hopelessness, anxiety, (Hypo) Manic Symptoms:  None noted  Anxiety Symptoms:  None noted Psychotic Symptoms:  None noted PTSD Symptoms: NA  Psychiatric Specialty Exam: Physical Exam  ROS  Blood pressure 137/92, pulse 73, temperature 97.5 F (36.4 C), temperature source Oral, resp. rate 20, height 5' 8.5" (1.74 m), weight 86.864 kg (191 lb 8 oz).Body mass index is 28.69 kg/(m^2).  General Appearance: Casual and Fairly Groomed  Patent attorney::  Good  Speech:  Clear and Coherent and Normal Rate  Volume:  Normal  Mood:  Anxious  Affect:  Appropriate  Thought Process:  Circumstantial and Goal Directed  Orientation:  Full (Time, Place, and Person)  Thought Content:  WDL  Suicidal Thoughts:  No  Homicidal Thoughts:  No  Memory:  Immediate;   Fair Recent;   Fair Remote;   Fair  Judgement:  Fair  Insight:  Good and Present  Psychomotor Activity:  NA  Concentration:  Good  Recall:  Fair  Akathisia:  No  Handed:  Right  AIMS (if indicated):     Assets:  Communication Skills Desire for Improvement Housing Social Support Transportation  Sleep:  Number of Hours: 4.75    Past Psychiatric History: Diagnosis:  Hospitalizations:  Outpatient Care:  Substance Abuse Care:  Self-Mutilation:  Suicidal Attempts:  Violent Behaviors:   Past Medical History:    Past Medical History  Diagnosis Date  . MVP (mitral valve prolapse)   . Anxiety   . Hypertension   . Coronary artery disease     s/p PCI to RCA in 2007  . Hyperlipidemia   . Palpitations   . Bipolar disorder   . Depression   . Esophagitis   . Gastritis   . Insomnia   . Seizures   . Pneumonia 2005  . Arthritis    None. Allergies:   Allergies  Allergen Reactions  . Ambien (Zolpidem Tartrate) Other (See Comments)    Sleep Walking  . Codeine Itching   PTA Medications: Prescriptions prior to admission  Medication Sig Dispense Refill  . aspirin EC 325 MG tablet Take 325 mg by mouth every morning.      Marland Kitchen atorvastatin (LIPITOR) 40 MG tablet Take 40 mg by mouth daily.      Marland Kitchen buPROPion (WELLBUTRIN XL) 300 MG 24 hr tablet Take 300 mg by mouth daily.       . metoprolol succinate (TOPROL-XL) 25 MG 24 hr tablet Take 25 mg by mouth daily.      . QUEtiapine (SEROQUEL XR) 50 MG TB24 Take 150 mg by mouth at bedtime. Take 3 tablets (150 mg total) at bed time        Previous Psychotropic Medications:  Medication/Dose                 Substance Abuse History in the last 12 months:  yes  Consequences of Substance Abuse: Medical Consequences:  injury or death Legal  Consequences:  jail, or arrest Family Consequences:  Family discord DT's: Withdrawal Symptoms:   Diarrhea  Social History:  reports that he quit smoking about 5 months ago. He does not have any smokeless tobacco history on file. He reports that  drinks alcohol. He reports that he uses illicit drugs (Benzodiazepines) about 7 times per week. Additional Social History: Pain Medications: N/A History of alcohol / drug use?: Yes Negative Consequences of Use: Personal relationships Name of Substance 1: ETOH 1 - Age of First Use: pt "doesn't know"                  Current Place of Residence:   Place of Birth:   Family Members: Marital Status:  Single Children:  Sons:  Daughters: Relationships: Education:   Corporate treasurer Problems/Performance: Religious Beliefs/Practices: History of Abuse (Emotional/Phsycial/Sexual) Occupational Experiences; Military History:  None. Legal History: Hobbies/Interests:  Family History:   Family History  Problem Relation Age of Onset  . Heart disease Neg Hx   . Prostate cancer Father     Results for orders placed during the hospital encounter of 03/19/12 (from the past 72 hour(s))  CBC     Status: Abnormal   Collection Time    03/21/12  4:35 PM      Result Value Range   WBC 5.7  4.0 - 10.5 K/uL   RBC 3.83 (*) 4.22 - 5.81 MIL/uL   Hemoglobin 9.8 (*) 13.0 - 17.0 g/dL   HCT 24.4 (*) 01.0 - 27.2 %   MCV 80.9  78.0 - 100.0 fL   MCH 25.6 (*) 26.0 - 34.0 pg   MCHC 31.6  30.0 - 36.0 g/dL   RDW 53.6 (*) 64.4 - 03.4 %   Platelets 374  150 - 400 K/uL  BASIC METABOLIC PANEL     Status: Abnormal   Collection Time    03/21/12  4:35 PM      Result Value Range   Sodium 136  135 - 145 mEq/L   Potassium 3.8  3.5 - 5.1 mEq/L   Chloride 102  96 - 112 mEq/L   CO2 22  19 - 32 mEq/L   Glucose, Bld 136 (*) 70 - 99 mg/dL   BUN 13  6 - 23 mg/dL   Creatinine, Ser 7.42  0.50 - 1.35 mg/dL   Calcium 9.4  8.4 - 59.5 mg/dL   GFR calc non Af Amer 90 (*) >90 mL/min   GFR calc Af Amer >90  >90 mL/min   Comment:            The eGFR has been calculated     using the CKD EPI equation.     This calculation has not been     validated in all clinical     situations.     eGFR's persistently     <90 mL/min signify     possible Chronic Kidney Disease.  CBC     Status: Abnormal   Collection Time    03/22/12  9:45 AM      Result Value Range   WBC 5.2  4.0 - 10.5 K/uL   RBC 3.78 (*) 4.22 - 5.81 MIL/uL   Hemoglobin 9.6 (*) 13.0 - 17.0 g/dL   HCT 63.8 (*) 75.6 - 43.3 %   MCV 82.0  78.0 - 100.0 fL   MCH 25.4 (*) 26.0 - 34.0 pg   MCHC 31.0  30.0 - 36.0 g/dL   RDW 29.5 (*) 18.8 - 41.6 %  Platelets 362  150 - 400 K/uL   Comment: PLATELET COUNT CONFIRMED BY SMEAR   VITAMIN B12     Status: None   Collection Time    03/22/12  9:45 AM      Result Value Range   Vitamin B-12 471  211 - 911 pg/mL  FOLATE     Status: None   Collection Time    03/22/12  9:45 AM      Result Value Range   Folate 14.2     Comment: (NOTE)     Reference Ranges            Deficient:       0.4 - 3.3 ng/mL            Indeterminate:   3.4 - 5.4 ng/mL            Normal:              > 5.4 ng/mL  IRON AND TIBC     Status: Abnormal   Collection Time    03/22/12  9:45 AM      Result Value Range   Iron 17 (*) 42 - 135 ug/dL   TIBC 161  096 - 045 ug/dL   Saturation Ratios 4 (*) 20 - 55 %   UIBC 392  125 - 400 ug/dL  FERRITIN     Status: Abnormal   Collection Time    03/22/12  9:45 AM      Result Value Range   Ferritin 14 (*) 22 - 322 ng/mL  RETICULOCYTES     Status: Abnormal   Collection Time    03/22/12  9:45 AM      Result Value Range   Retic Ct Pct 1.2  0.4 - 3.1 %   RBC. 3.78 (*) 4.22 - 5.81 MIL/uL   Retic Count, Manual 45.4  19.0 - 186.0 K/uL   Psychological Evaluations:  Assessment:   AXIS I:  Substance Abuse and Alcohol abuse AXIS II:  Deferred AXIS III:   Past Medical History  Diagnosis Date  . MVP (mitral valve prolapse)   . Anxiety   . Hypertension   . Coronary artery disease     s/p PCI to RCA in 2007  . Hyperlipidemia   . Palpitations   . Bipolar disorder   . Depression   . Esophagitis   . Gastritis   . Insomnia   . Seizures   . Pneumonia 2005  . Arthritis    AXIS IV:  other psychosocial or environmental problems AXIS V:  51-60 moderate symptoms  Treatment Plan/Recommendations:   1. Admit for crisis management and stabilization. Estimated length of stay is 3 to 5 days. 2. Medication management to reduce current symptoms to base line and improve the   patient's overall level of functioning.  3. Treat health problems as indicated: Neosporin ointment, apply to wound spot to back of left leg bid. 4. Develop treatment plan to decrease risk  of relapse upon discharge and the need for readmission.  5. Psycho-social education regarding relapse prevention and self-care.  6. Health care follow up as needed for medical problems.  7. Review and reinstate any pertinent home medications for other health issues where appropriate.  8.  Call for Consult with Hospitalist for additional specialty patient services as needed  Treatment Plan Summary: Daily contact with patient to assess and evaluate symptoms and progress in treatment Medication management Current Medications:  Current Facility-Administered Medications  Medication Dose Route Frequency Provider  Last Rate Last Dose  . acetaminophen (TYLENOL) tablet 650 mg  650 mg Oral Q6H PRN Shuvon Rankin, NP   650 mg at 03/23/12 0640  . alum & mag hydroxide-simeth (MAALOX/MYLANTA) 200-200-20 MG/5ML suspension 30 mL  30 mL Oral Q4H PRN Shuvon Rankin, NP      . aspirin EC tablet 325 mg  325 mg Oral q morning - 10a Shuvon Rankin, NP   325 mg at 03/23/12 1006  . atorvastatin (LIPITOR) tablet 40 mg  40 mg Oral Daily Shuvon Rankin, NP   40 mg at 03/23/12 0800  . buPROPion (WELLBUTRIN XL) 24 hr tablet 300 mg  300 mg Oral Daily Shuvon Rankin, NP   300 mg at 03/23/12 0800  . chlordiazePOXIDE (LIBRIUM) capsule 25 mg  25 mg Oral Q6H PRN Shuvon Rankin, NP   25 mg at 03/23/12 0801  . magnesium hydroxide (MILK OF MAGNESIA) suspension 30 mL  30 mL Oral Daily PRN Shuvon Rankin, NP      . metoprolol succinate (TOPROL-XL) 24 hr tablet 25 mg  25 mg Oral Daily Shuvon Rankin, NP   25 mg at 03/23/12 0800  . nicotine (NICODERM CQ - dosed in mg/24 hr) patch 7 mg  7 mg Transdermal Daily Rachael Fee, MD   7 mg at 03/22/12 2318  . QUEtiapine (SEROQUEL XR) 24 hr tablet 150 mg  150 mg Oral QHS Shuvon Rankin, NP   150 mg at 03/22/12 2137    Observation Level/Precautions:  15 minute checks  Laboratory:  CBC  Psychotherapy:  Group sessions   Medications:  Monitor add or chang  Consultations:  None at this time  Discharge  Concerns:  Relapse or self harm  Estimated LOS: 7 days  Other:     I certify that inpatient services furnished can reasonably be expected to improve the patient's condition.   Rankin, Shuvon 3/1/201412:59 PM  Mental status examination. Patient is casually dressed and fairly groomed.  He appears very anxious but cooperative.  He maintained fair eye contact.  His his speech is clear and coherent and his thought processes logical and goal-directed.  He has tremors in his hand.  He endorse anxiety and nervousness but denies any active or passive suicidal thoughts or homicidal thoughts.  He denies any auditory or visual hallucination.  He denies any paranoia or any obsessive thoughts.  His attention and concentration is fair.  His psychomotor activity is slightly increased.  His fund of knowledge is adequate.  Is alert and oriented x3.  His insight judgment and impulse control is okay.  Assessment and plan Patient is a 62 year old Caucasian male who is admitted after relapse into drinking.  He was given pain medication due to recent hip surgery but he ran out and started drinking.  Review labs, review of systems, history and symptoms.  We will admit this patient for inpatient treatment.  We will start his psychiatric medication and Librium as needed.  Encourage him to participate in group milieu therapy.  Please see complete treatment plan above.I have examined the patient an agreed with the findings of H&P

## 2012-03-23 NOTE — Progress Notes (Signed)
Adult Psychoeducational Group Note  Date:  03/23/2012 Time:  2:30 PM  Group Topic/Focus:  Healthy Communication:   The focus of this group is to discuss communication, barriers to communication, as well as healthy ways to communicate with others.  Participation Level:  Active  Participation Quality:  Attentive  Affect:  Excited  Cognitive:  Alert and Appropriate  Insight: Appropriate  Engagement in Group:  Engaged and Supportive  Modes of Intervention:  Discussion, Problem-solving and Support  Additional Comments:  Pt showed good insight on topics of discussion, pt actively participated and supported others during group.  Alfonse Spruce 03/23/2012, 2:30 PM

## 2012-03-23 NOTE — Clinical Social Work Note (Signed)
[facility] Group Notes:  (Clinical Social Work)  03/23/2012     10-11AM  Summary of Progress/Problems:   The main focus of today's process group was for the patient to identify ways in which they have in the past sabotaged their own recovery. Motivational Interviewing was utilized to ask the group members what they get out of their substance use, and what they want to change.  The Stages of Change were explained, and members identified where they currently are with regard to stages of change.  The patient expressed that he uses alcohol to relax and escape reality.  He stated his motivation is 10 out of 10 because it took 3 days for him to get here from the medical floor.  He wants to return to going to two AA/NA meetings a week, and feels that he should not go to more than that, because then it becomes a chore that is repetitive and he disengages.  CSW suggested going to support or therapy groups on the other days to address his dual diagnosis and remain fully committed to wellness in both/all areas.  Type of Therapy:  Group Therapy - Process   Participation Level:  Active  Participation Quality:  Attentive, Sharing and Supportive  Affect:  Appropriate  Cognitive:  Appropriate and Oriented  Insight:  Engaged  Engagement in Therapy:  Engaged  Modes of Intervention:  Education, Teacher, English as a foreign language, Exploration, Discussion, Motivational Interviewing   Ambrose Mantle, LCSW 03/23/2012, 12:19 PM

## 2012-03-23 NOTE — Progress Notes (Signed)
D:  Per pt self inventory pt reports sleep fair, appetite improving, energy level normal, ability to pay attention good, rates depression at a 1 out of 10 and hopelessness at a 1 out of 10, c/o some agitation/anxiety this am.      A:  Emotional support provided, Encouraged pt to continue with treatment plan and attend all group activities, q15 min checks maintained for safety prn Librium given, see orders.  R:  Pt is receptive to treatment, participated and shared in group, pleasant and cooperative towards staff and peers.  Pt plans to continue with his physical therapy after discharge and not take narcotics or drink alcohol.  Pt states that he has just been taking tylenol here for pain and it helps to ease the pain.

## 2012-03-23 NOTE — Progress Notes (Signed)
Adult Psychosocial Assessment Update Interdisciplinary Team  [facility] admissions/discharges:  Admissions Discharges  Date:  09/01/11 Date:  09/06/11  Date:  10/10/09 Date:  10/15/09  Date:  01/02/09 Date:  01/07/09  Date:  08/31/08 Date:  09/07/08  Date:  12/14/07 Date:  12/17/07   Date:  12/06/06 Date:  12/10/06   Date:  09/29/06 Date:  10/04/06   Date:  06/23/06 Date:  06/27/06   Date:  09/22/05 Date:  09/29/05   Date:  03/09/04 Date:  03/14/04   Date:  02/11/04 Date:  02/18/04   Date:  12/22/03 Date:  12/27/03   Date:  11/05/03 Date:  11/09/03   Changes since the last Psychosocial Assessment (including adherence to outpatient mental health and/or substance abuse treatment, situational issues contributing to decompensation and/or relapse). Has had hip surgery on his left hip recently.  States he was not in the hospital long enough, compared to his right hip years ago where he stayed in the hospital for 10 days.  He used to eat well, take care of himself, work out every other day at the gym, and swim, but recently he has been eating poorly, not exercising, smoking cigarettes even though he was not a smoker previously.  Had been drinking and taking the narcotic pain medication, but increased drinking to compensate for the narcotic med refill being refused.  Had been going to AA two times a week and thinks that he possibly increase that.    .         Discharge Plan 1. Will you be returning to the same living situation after discharge?   Yes:  XX No:      If no, what is your plan?    Has a straight male roommate, and he is gay, so they get along very well because they do not interfere with each other's love lives.       2. Would you like a referral for services when you are discharged? Yes: XX    If yes, for what services?  No:       This is a 62yo Caucasian male who is hospitalized for detox.  Goes to [facility] months for a med check, is willing to add therapy.   Is not interested in rehab.       Summary and Recommendations (to be completed by the evaluator) This is a 62yo Caucasian male with Bipolar Disorder I and alcoholism, who was admitted for detox and rehabilitation from the medical unit.  He recently had hip replacement surgery, was using the narcotic pain medications but when those were no longer refilled, he turned back to alcohol.  He had been sober from 8/13 when he was last at this facility and 1/14.  There is a family history of Bipolar Disorder and his mother committed suicide.  His older brother has been missing for quite some time now, and they have no idea where he is.  There is a family history of alcoholism as well, with his father and older brother.  He has a roommate, and plans to return to that living situation although he admits his roommate drinks and this could be a problem.  He is gay himself, and his roommate is not, and they get along quite well.  He goes to [facility] for 53-month medication checks, is willing to add therapy.  With 13 hospitalizations in the last 9 years, he may benefit from some enhanced services as well such as a Merchandiser, retail  Team or Assertive Advertising copywriter.  He would benefit from safety monitoring, medication evaluation, psychoeducation, group therapy, and discharge planning to link with ongoing resources.                        Signature:  Sarina Ser, 03/23/2012 9:18 AM

## 2012-03-24 ENCOUNTER — Encounter (HOSPITAL_COMMUNITY): Payer: Self-pay | Admitting: Registered Nurse

## 2012-03-24 NOTE — Progress Notes (Signed)
D-Patient is active on the unit and attending the scheduled groups. He reports sleeping poorly last night due to disruption from roommate. Patient did request medication and took a nap after morning group. Patient reports that his appetite is improving. Rates his depression at 8 and feeling hopeless at 10. Denies SI.  A-Patient is very pleasant during interactions. His only complaint is some mild pain of his left hip. The patient is very engaged in group. He plans to exercise and eat better after he leaves the hospital.  R-Patient remains safe on the unit and is receptive to emotional support.

## 2012-03-24 NOTE — Progress Notes (Signed)
Adult Psychoeducational Group Note  Date:  03/24/2012 Time:  6:53 PM  Group Topic/Focus:  different perspective  Participation Level:  Active  Participation Quality:  Appropriate and Attentive  Affect:  Appropriate  Cognitive:  Alert and Appropriate  Insight: Appropriate and Good  Engagement in Group:  Engaged  Modes of Intervention:  Activity and Education  Additional Comments:  Pt was engaged in discussion about different perspectives; engaged in activity where pts viewed different illusions and guessed what those images were.      Dalia Heading 03/24/2012, 6:53 PM

## 2012-03-24 NOTE — Progress Notes (Signed)
Adult Psychoeducational Group Note  Date:  03/24/2012 Time:  13:15  Group Topic/Focus:  Making Healthy Choices:   The focus of this group is to help patients identify negative/unhealthy choices they were using prior to admission and identify positive/healthier coping strategies to replace them upon discharge.  Participation Level:  Active  Participation Quality:  Appropriate  Affect:  Appropriate  Cognitive:  Alert  Insight: Good  Engagement in Group:  Engaged  Modes of Intervention:  Discussion  Additional Comments:  Patient is very open to exploring healthier choices in his life even in the face of pain or problems. Patient would like to start some from of exercise.   Fransisca Kaufmann ANN 03/24/2012, 2:50 PM

## 2012-03-24 NOTE — Clinical Social Work Note (Signed)
[facility] Group Notes: (Clinical Social Work)   03/24/2012      Type of Therapy:  Group Therapy   Participation Level:  Did Not Attend    Mareida Grossman-Orr, LCSW 03/24/2012, 11:09 AM     

## 2012-03-24 NOTE — Progress Notes (Signed)
D.  Pt pleasant and bright on approach, denies complaints at this time.  Interacting appropriately within the milieu.  Positive for evening AA group.  Still some left hip pain status post his surgery six weeks ago.  A.  Support and encouragement offered, medication given as ordered  R.  Pt in room presently preparing for bed, will continue to monitor.

## 2012-03-24 NOTE — Progress Notes (Signed)
[facility] MD Progress Note  03/24/2012 7:52 AM [name]  MRN:  [id number] Subjective:  Patient states that he is trying to participate in group and pay attention.  States that it is supportive to hear the other stories and get advice. Diagnosis:  Axis I: Alcohol Abuse and Substance Abuse  ADL's:  Intact  Sleep: Fair  Appetite:  Good  Suicidal Ideation:  No Homicidal Ideation:  No AEB (as evidenced by):Patient is participating in group sessions; tolerating medications without side effects; and interested in continuing therapy after discharge.  States that he wants all the help he can get to prevent relapse   Psychiatric Specialty Exam: Review of Systems  Constitutional: Negative.   HENT: Negative.   Eyes: Negative.   Respiratory: Negative.   Cardiovascular: Negative.   Gastrointestinal: Negative.   Genitourinary: Negative.   Musculoskeletal: Negative.   Skin: Negative.   Neurological: Negative.        Patient states that he continues to have the shakes  Endo/Heme/Allergies: Negative.   Psychiatric/Behavioral: Positive for substance abuse (Patient states that he got addicted to the pain killers after surgery and then turned to alcohol). Negative for depression (Patient states that he has not really experienced any depression as he has anxiety), suicidal ideas (Denies), hallucinations and memory loss. The patient is nervous/anxious (rates 5/10). The patient does not have insomnia (Patient statess a little more difficult with the light shinning in room).     Blood pressure 161/107, pulse 73, temperature 97.5 F (36.4 C), temperature source Oral, resp. rate 18, height 5' 8.5" (1.74 m), weight 86.864 kg (191 lb 8 oz).Body mass index is 28.69 kg/(m^2).  General Appearance: Well Groomed  Patent attorney::  Good  Speech:  Clear and Coherent and Normal Rate  Volume:  Normal  Mood:  Anxious  Affect:  Appropriate  Thought Process:  Circumstantial and Goal Directed  Orientation:  Full (Time,  Place, and Person)  Thought Content:  WDL  Suicidal Thoughts:  No  Homicidal Thoughts:  No  Memory:  Immediate;   Good Recent;   Good Remote;   Good  Judgement:  Fair  Insight:  Fair  Psychomotor Activity:  Normal  Concentration:  Good  Recall:  Fair  Akathisia:  No  Handed:  Right  AIMS (if indicated):     Assets:  Communication Skills Desire for Improvement Housing Social Support Transportation Vocational/Educational  Sleep:  Number of Hours: 5   Current Medications: Current Facility-Administered Medications  Medication Dose Route Frequency Provider Last Rate Last Dose  . acetaminophen (TYLENOL) tablet 650 mg  650 mg Oral Q6H PRN Rylann Munford, NP   650 mg at 03/23/12 2139  . alum & mag hydroxide-simeth (MAALOX/MYLANTA) 200-200-20 MG/5ML suspension 30 mL  30 mL Oral Q4H PRN Stephaney Steven, NP      . aspirin EC tablet 325 mg  325 mg Oral q morning - 10a Thien Berka, NP   325 mg at 03/23/12 1006  . atorvastatin (LIPITOR) tablet 40 mg  40 mg Oral Daily Pachia Strum, NP   40 mg at 03/23/12 0800  . buPROPion (WELLBUTRIN XL) 24 hr tablet 300 mg  300 mg Oral Daily Adamarie Izzo, NP   300 mg at 03/23/12 0800  . chlordiazePOXIDE (LIBRIUM) capsule 25 mg  25 mg Oral Q6H PRN Jillyan Plitt, NP   25 mg at 03/23/12 2245  . magnesium hydroxide (MILK OF MAGNESIA) suspension 30 mL  30 mL Oral Daily PRN Livianna Petraglia, NP   30 mL  at 03/23/12 2246  . metoprolol succinate (TOPROL-XL) 24 hr tablet 25 mg  25 mg Oral Daily Assunta Pupo, NP   25 mg at 03/23/12 0800  . nicotine (NICODERM CQ - dosed in mg/24 hr) patch 7 mg  7 mg Transdermal Daily Rachael Fee, MD   7 mg at 03/24/12 0607  . QUEtiapine (SEROQUEL XR) 24 hr tablet 150 mg  150 mg Oral QHS Demari Kropp, NP   150 mg at 03/23/12 2125    Lab Results:  Results for orders placed during the hospital encounter of 03/22/12 (from the past 48 hour(s))  CBC WITH DIFFERENTIAL     Status: Abnormal   Collection Time    03/23/12  7:27 PM       Result Value Range   WBC 7.6  4.0 - 10.5 K/uL   RBC 4.10 (*) 4.22 - 5.81 MIL/uL   Hemoglobin 10.4 (*) 13.0 - 17.0 g/dL   HCT 16.1 (*) 09.6 - 04.5 %   MCV 82.7  78.0 - 100.0 fL   MCH 25.4 (*) 26.0 - 34.0 pg   MCHC 30.7  30.0 - 36.0 g/dL   RDW 40.9 (*) 81.1 - 91.4 %   Platelets 416 (*) 150 - 400 K/uL   Neutrophils Relative 39 (*) 43 - 77 %   Neutro Abs 3.0  1.7 - 7.7 K/uL   Lymphocytes Relative 37  12 - 46 %   Lymphs Abs 2.8  0.7 - 4.0 K/uL   Monocytes Relative 13 (*) 3 - 12 %   Monocytes Absolute 1.0  0.1 - 1.0 K/uL   Eosinophils Relative 11 (*) 0 - 5 %   Eosinophils Absolute 0.8 (*) 0.0 - 0.7 K/uL   Basophils Relative 0  0 - 1 %   Basophils Absolute 0.0  0.0 - 0.1 K/uL    Physical Findings: AIMS: Facial and Oral Movements Muscles of Facial Expression: None, normal Lips and Perioral Area: None, normal Jaw: None, normal Tongue: None, normal,Extremity Movements Upper (arms, wrists, hands, fingers): None, normal Lower (legs, knees, ankles, toes): None, normal, Trunk Movements Neck, shoulders, hips: None, normal, Overall Severity Severity of abnormal movements (highest score from questions above): None, normal Incapacitation due to abnormal movements: None, normal Patient's awareness of abnormal movements (rate only patient's report): No Awareness, Dental Status Current problems with teeth and/or dentures?: No Does patient usually wear dentures?: No  CIWA:  CIWA-Ar Total: 2 COWS:  COWS Total Score: 1  Treatment Plan Summary: Daily contact with patient to assess and evaluate symptoms and progress in treatment Medication management  Plan:  Will continue current treatment plan  Medical Decision Making Problem Points:  Established problem, stable/improving (1), Review of last therapy session (1) and Review of psycho-social stressors (1) Data Points:  Review or order clinical lab tests (1) Review of medication regiment & side effects (2)  I certify that inpatient services  furnished can reasonably be expected to improve the patient's condition.   Denney Shein 03/24/2012, 7:52 AM

## 2012-03-24 NOTE — Progress Notes (Signed)
Adult Psychoeducational Group Note  Date:  03/24/2012 Time:  10:19 AM  Group Topic/Focus:  Self Inventory Review  Participation Level:  Did Not Attend  Participation Quality:  Did not attend  Affect:  Did not attend  Cognitive:  Did not attend  Insight: None  Engagement in Group:  Did not attend  Modes of Intervention:  Did not attend  Additional Comments:    Angela Adam 03/24/2012, 10:19 AM

## 2012-03-25 MED ORDER — HYDROXYZINE HCL 25 MG PO TABS
25.0000 mg | ORAL_TABLET | Freq: Four times a day (QID) | ORAL | Status: DC | PRN
  Administered 2012-03-25 – 2012-03-26 (×3): 25 mg via ORAL
  Filled 2012-03-25: qty 1

## 2012-03-25 NOTE — Progress Notes (Signed)
Patient did attend the evening speaker AA meeting.  

## 2012-03-25 NOTE — Progress Notes (Signed)
[facility] MD Progress Note  03/25/2012 4:55 PM [name]  MRN:  [id number] Subjective:  Still not feeling right. Still feeling somewhat jittery, tremulous. Says he was afraid he could have a seizure with all the alcohol he was drinking, the pain pills and the muscle relaxants he was using. He is having a hard time with his hip. States that he relapsed on alcohol after they dropped the pain medications and the muscle relaxer. He would like to be considered for an assisted living facility. His roommate does not want him back (the roommate drinks for what he thinks it is a good idea not to go back anyway  Diagnosis:  Alcohol Dependence/S/P withdrawal, Anxiety Disorder NOS  ADL's:  Intact  Sleep: Fair  Appetite:  Fair  Suicidal Ideation:  Plan:  denies Intent:  denies Means:  denies Homicidal Ideation:  Plan:  denies Intent:  denies Means:  denies AEB (as evidenced by):  Psychiatric Specialty Exam: Review of Systems  Constitutional: Negative.   HENT: Negative.   Eyes: Negative.   Respiratory: Negative.   Cardiovascular: Negative.   Gastrointestinal: Negative.   Genitourinary: Negative.   Musculoskeletal: Positive for back pain and joint pain.  Skin: Negative.   Neurological: Positive for dizziness and tremors.  Endo/Heme/Allergies: Negative.   Psychiatric/Behavioral: Positive for depression and substance abuse. The patient is nervous/anxious and has insomnia.     Blood pressure 114/72, pulse 81, temperature 98 F (36.7 C), temperature source Oral, resp. rate 16, height 5' 8.5" (1.74 m), weight 86.864 kg (191 lb 8 oz).Body mass index is 28.69 kg/(m^2).  General Appearance: Fairly Groomed  Patent attorney::  Fair  Speech:  Clear and Coherent and Slow  Volume:  Decreased  Mood:  Anxious and worried, in pain  Affect:  anxious  Thought Process:  Coherent and Goal Directed  Orientation:  Full (Time, Place, and Person)  Thought Content:  worries and concerns  Suicidal Thoughts:  No   Homicidal Thoughts:  No  Memory:  Immediate;   Fair Recent;   Fair Remote;   Fair  Judgement:  Fair  Insight:  Present  Psychomotor Activity:  Restlessness  Concentration:  Fair  Recall:  Fair  Akathisia:  No  Handed:  Right  AIMS (if indicated):     Assets:  Desire for Improvement  Sleep:  Number of Hours: 4.25   Current Medications: Current Facility-Administered Medications  Medication Dose Route Frequency Provider Last Rate Last Dose  . acetaminophen (TYLENOL) tablet 650 mg  650 mg Oral Q6H PRN Shuvon Rankin, NP   650 mg at 03/25/12 0756  . alum & mag hydroxide-simeth (MAALOX/MYLANTA) 200-200-20 MG/5ML suspension 30 mL  30 mL Oral Q4H PRN Shuvon Rankin, NP      . aspirin EC tablet 325 mg  325 mg Oral q morning - 10a Shuvon Rankin, NP   325 mg at 03/25/12 1109  . atorvastatin (LIPITOR) tablet 40 mg  40 mg Oral Daily Shuvon Rankin, NP   40 mg at 03/25/12 0755  . buPROPion (WELLBUTRIN XL) 24 hr tablet 300 mg  300 mg Oral Daily Shuvon Rankin, NP   300 mg at 03/25/12 0755  . chlordiazePOXIDE (LIBRIUM) capsule 25 mg  25 mg Oral Q6H PRN Shuvon Rankin, NP   25 mg at 03/24/12 2243  . hydrOXYzine (ATARAX/VISTARIL) tablet 25 mg  25 mg Oral Q6H PRN Sanjuana Kava, NP   25 mg at 03/25/12 1114  . magnesium hydroxide (MILK OF MAGNESIA) suspension 30 mL  30  mL Oral Daily PRN Shuvon Rankin, NP   30 mL at 03/24/12 2243  . metoprolol succinate (TOPROL-XL) 24 hr tablet 25 mg  25 mg Oral Daily Shuvon Rankin, NP   25 mg at 03/25/12 0755  . nicotine (NICODERM CQ - dosed in mg/24 hr) patch 7 mg  7 mg Transdermal Daily Rachael Fee, MD   7 mg at 03/24/12 0607  . QUEtiapine (SEROQUEL XR) 24 hr tablet 150 mg  150 mg Oral QHS Shuvon Rankin, NP   150 mg at 03/24/12 2142    Lab Results:  Results for orders placed during the hospital encounter of 03/22/12 (from the past 48 hour(s))  CBC WITH DIFFERENTIAL     Status: Abnormal   Collection Time    03/23/12  7:27 PM      Result Value Range   WBC 7.6  4.0 -  10.5 K/uL   RBC 4.10 (*) 4.22 - 5.81 MIL/uL   Hemoglobin 10.4 (*) 13.0 - 17.0 g/dL   HCT 28.4 (*) 13.2 - 44.0 %   MCV 82.7  78.0 - 100.0 fL   MCH 25.4 (*) 26.0 - 34.0 pg   MCHC 30.7  30.0 - 36.0 g/dL   RDW 10.2 (*) 72.5 - 36.6 %   Platelets 416 (*) 150 - 400 K/uL   Neutrophils Relative 39 (*) 43 - 77 %   Neutro Abs 3.0  1.7 - 7.7 K/uL   Lymphocytes Relative 37  12 - 46 %   Lymphs Abs 2.8  0.7 - 4.0 K/uL   Monocytes Relative 13 (*) 3 - 12 %   Monocytes Absolute 1.0  0.1 - 1.0 K/uL   Eosinophils Relative 11 (*) 0 - 5 %   Eosinophils Absolute 0.8 (*) 0.0 - 0.7 K/uL   Basophils Relative 0  0 - 1 %   Basophils Absolute 0.0  0.0 - 0.1 K/uL    Physical Findings: AIMS: Facial and Oral Movements Muscles of Facial Expression: None, normal Lips and Perioral Area: None, normal Jaw: None, normal Tongue: None, normal,Extremity Movements Upper (arms, wrists, hands, fingers): None, normal Lower (legs, knees, ankles, toes): None, normal, Trunk Movements Neck, shoulders, hips: None, normal, Overall Severity Severity of abnormal movements (highest score from questions above): None, normal Incapacitation due to abnormal movements: None, normal Patient's awareness of abnormal movements (rate only patient's report): No Awareness, Dental Status Current problems with teeth and/or dentures?: No Does patient usually wear dentures?: No  CIWA:  CIWA-Ar Total: 2 COWS:  COWS Total Score: 1  Treatment Plan Summary: Daily contact with patient to assess and evaluate symptoms and progress in treatment Medication management  Plan: Supportive approach/coping skills/relapse prevention           Complete the Librium Detox           Reassess his present medications  Medical Decision Making Problem Points:  Review of psycho-social stressors (1) Data Points:  Review of new medications or change in dosage (2)  I certify that inpatient services furnished can reasonably be expected to improve the patient's  condition.   LUGO,IRVING A 03/25/2012, 4:55 PM

## 2012-03-25 NOTE — Progress Notes (Signed)
Pt has been up for most groups today and has been interacting appropriately thus far.  He was given vistaril at 114 for his anxiety and tylenol at 0756 which provided him relief.  He rated his depression a 6 hopelessness a 5 and anxiety a 7 on his self-inventory. He denies any S/H ideations or A/V hallucinations.  She has stated,"I don't have a place to live" she plans to talk with the case manager tomorrow.

## 2012-03-25 NOTE — Progress Notes (Signed)
[facility] LCSW Aftercare Discharge Planning Group Note  03/25/2012 8:45 AM  Participation Quality:  Appropriate  Affect: Appropriate  Cognitive: Appropriate  Insight:  Developing  Engagement in Group: Engaged  Modes of Intervention:  Exploration, Dissuccion, Support  Summary of Progress/Problems:  Pt denies both suicidal and homicidal ideation.  On a scale of 1 to 10 with ten being the most ever experienced, the patient rates depression at a 5 and anxiety at a 8. Pt reports some of anxiety is probably related to where he will discharge to once detoxed and he requests referral to assisted living facility.   Clide Dales

## 2012-03-25 NOTE — Plan of Care (Signed)
Problem: Ineffective individual coping Goal: STG: Patient will participate in after care plan Outcome: Not Met (add Reason) Patient reports today he will not be able to return to home where he has lived for years; interested in an ALF  Problem: Alteration in mood & ability to function due to Goal: LTG-Patient demonstrates decreased signs of withdrawal (Patient demonstrates decreased signs of withdrawal to the point the patient is safe to return home and continue treatment in an outpatient setting)  Outcome: Not Met (add Reason) Patient reports still not feeling well due to weakness and tremors.  On a scale of 1 to 10 with ten being the most ever experienced, the patient rates depression at a 5 and anxiety at an 8.

## 2012-03-25 NOTE — Tx Team (Signed)
Interdisciplinary Treatment Plan Update (Adult)  Date: 03/25/2012  Time Reviewed: 9:49 AM   Progress in Treatment: Attending groups: Yes Participating in groups: Yes Taking medication as prescribed:  Yes Tolerating medication:  Yes Family/Significant othe contact made: Not as yet Patient understands diagnosis: Yes Discussing patient identified problems/goals with staff: Yes Medical problems stabilized or resolved:  Yes Denies suicidal/homicidal ideation: Yes Patient has not harmed self or Others: Yes  New problem(s) identified: None Identified  Discharge Plan or Barriers:  CSW is assessing for appropriate referrals. Patient requesting referral to ALF as he reports he cannot return to home where he has been living.    Additional comments: N/A  Reason for Continuation of Hospitalization: Anxiety  Depression Withdrawal symptoms   Estimated length of stay: 3-5 days  For review of initial/current patient goals, please see plan of care.  Attendees: Patient:     Family:     Physician:  Geoffery Lyons 03/25/2012 9:49 AM   Nursing:   Roswell Miners, RN 03/25/2012 9:49 AM   Clinical Social Worker Ronda Fairly 03/25/2012 9:49 AM   Other:  Norval Gable, NP Student 03/25/2012 9:49 AM   Other:   Robbie Louis, RN 03/25/2012 9:49 AM   Other:  Armandina Stammer, NO 03/25/2012 9:49 AM   Other:   03/25/2012 9:49 AM    Scribe for Treatment Team:   Carney Bern, LCSWA  03/25/2012 9:49 AM

## 2012-03-26 MED ORDER — HYDROXYZINE HCL 25 MG PO TABS
25.0000 mg | ORAL_TABLET | ORAL | Status: DC | PRN
  Administered 2012-03-26 – 2012-03-29 (×12): 25 mg via ORAL
  Filled 2012-03-26: qty 30
  Filled 2012-03-26: qty 1

## 2012-03-26 MED ORDER — ACETAMINOPHEN 325 MG PO TABS
650.0000 mg | ORAL_TABLET | ORAL | Status: DC | PRN
  Administered 2012-03-26 – 2012-03-28 (×6): 650 mg via ORAL

## 2012-03-26 NOTE — Progress Notes (Signed)
  03/25/2012  1:15 PM   Type of Therapy:  Group Therapy 1:15 to 2:30 PM  Participation Level:  Appropriate  Participation Quality:  Appropriate  Affect:  Anxious  Cognitive: Developing  Insight:  Developing  Engagement in Therapy: Appropriate  Modes of Intervention:  Clarification, discussion, exploration, and support  Summary of Progress/Problems:  Group discussion focused on what patient's see as their own obstacles to recovery.  Patient shares belief that housing change will be an obstacle as he has lived in same place for years. [name] was later able to process how he has ability to rationalize whatever he wants to [facility] in the moment yet postpones self care to the last moment.  Patient willing to consider the possibilities of Self care pay offs.   [name]

## 2012-03-26 NOTE — Progress Notes (Signed)
Recreation Therapy Notes  Date: 03.04.2014 Time: 3:00pm Location: Art Room      Group Topic/Focus: Coping Skills  Participation Level: Active  Participation Quality: Appropriate  Affect: Euthymic  Cognitive: Appropriate  Additional Comments: Patient were given a copy of "Feed the Positive Dog" by Mendel Ryder. Patient was asked to read the 11 positive activities and pick the one that most appealed to him. Patient was then asked to color a worksheet with the outline of a dog to represent which positive activity they chose. Patient chose Day 1 - Take a Thank You Walk. Patient spoke about his dog and about the joy he used to get from taking her for a walk. Patient stated he could use walks as a coping mechanism post discharge. Patient stated he thought it would be easy to remember to use this coping mechanism when he gets home.   Marykay Lex Blanchfield, LRT/CTRS         [name] 03/26/2012 3:51 PM

## 2012-03-26 NOTE — Progress Notes (Signed)
Patient ID: [name], male   DOB: 1950-03-30, 62 y.o.   MRN: [id number]  D: Pt informed the writer that he'd had left hip surgery 6 wks ago. Stated, "I told the dr that I had an addiction problem". Then pt started naming all the medications he was given post surgery. However, pt stated that he went to the ED due to chest pain and after answering questions was told to come to [facility].  A:  Support and encouragement was offered. 15 min checks continued for safety.  R: Pt remains safe.

## 2012-03-26 NOTE — Progress Notes (Signed)
Pt still having depression and hopelessness a 5 and his anxiety a 3 on his self-inventory. He denies any S/H ideation or A/V hallucinations.  Pt has been medication seeking thorough out the day. He had Dr. Dub Mikes to change pt's tylenol and vistaril to q 4 hours prn and added seroquel 50 mg tid.  See MAR, daily cares and pain to see results.  Pt wants to look into a ALF and case manager is aware.  Unsure at this time what his discharge plan.

## 2012-03-26 NOTE — Progress Notes (Signed)
[facility] MD Progress Note  03/26/2012 7:38 PM HERSEY MACLELLAN  MRN:  [id number] Subjective:  [name] endorses that he is having a hard time with his pain and his anxiety. Asked to increase the frequency of both the Tylenol and the Vistaril. He is concerned about when he gets out of here. He is not going to be able to go back with hie current roommate. He feels he needs to go to an assisted living facility while he recovers fully from the surgery. Concerned about going back to the same situation and continue to use alcohol to medicate.  Diagnosis:  Alcohol Dependence, Anxiety Disorder NOS  ADL's:  Intact  Sleep: Fair  Appetite:  Fair  Suicidal Ideation:  Plan:  Denies Intent:  Denies Means:  Denies Homicidal Ideation:  Plan:  Denies Intent:  Denies Means:  Denies AEB (as evidenced by):  Psychiatric Specialty Exam: Review of Systems  Constitutional: Negative.   HENT: Negative.   Eyes: Negative.   Respiratory: Negative.   Cardiovascular: Negative.   Gastrointestinal: Negative.   Genitourinary: Negative.   Musculoskeletal: Positive for myalgias, back pain and joint pain.  Skin: Negative.   Neurological: Negative.   Endo/Heme/Allergies: Negative.   Psychiatric/Behavioral: Positive for depression and substance abuse. The patient is nervous/anxious.     Blood pressure 119/81, pulse 85, temperature 97.1 F (36.2 C), temperature source Oral, resp. rate 18, height 5' 8.5" (1.74 m), weight 86.864 kg (191 lb 8 oz).Body mass index is 28.69 kg/(m^2).  General Appearance: Fairly Groomed  Patent attorney::  Fair  Speech:  Clear and Coherent and rapid  Volume:  Normal  Mood:  Anxious, Depressed and worried  Affect:  anxious  Thought Process:  Coherent and Goal Directed  Orientation:  Full (Time, Place, and Person)  Thought Content:  worries, concerns  Suicidal Thoughts:  No  Homicidal Thoughts:  No  Memory:  Immediate;   Fair Recent;   Fair Remote;   Fair  Judgement:  Fair  Insight:  Present   Psychomotor Activity:  Restlessness  Concentration:  Fair  Recall:  Fair  Akathisia:  No  Handed:  Right  AIMS (if indicated):     Assets:  Desire for Improvement  Sleep:  Number of Hours: 6   Current Medications: Current Facility-Administered Medications  Medication Dose Route Frequency Provider Last Rate Last Dose  . acetaminophen (TYLENOL) tablet 650 mg  650 mg Oral Q4H PRN Rachael Fee, MD   650 mg at 03/26/12 1730  . alum & mag hydroxide-simeth (MAALOX/MYLANTA) 200-200-20 MG/5ML suspension 30 mL  30 mL Oral Q4H PRN Shuvon Rankin, NP      . aspirin EC tablet 325 mg  325 mg Oral q morning - 10a Shuvon Rankin, NP   325 mg at 03/26/12 1152  . atorvastatin (LIPITOR) tablet 40 mg  40 mg Oral Daily Shuvon Rankin, NP   40 mg at 03/26/12 0810  . buPROPion (WELLBUTRIN XL) 24 hr tablet 300 mg  300 mg Oral Daily Shuvon Rankin, NP   300 mg at 03/26/12 0810  . hydrOXYzine (ATARAX/VISTARIL) tablet 25 mg  25 mg Oral Q4H PRN Rachael Fee, MD   25 mg at 03/26/12 1730  . magnesium hydroxide (MILK OF MAGNESIA) suspension 30 mL  30 mL Oral Daily PRN Shuvon Rankin, NP   30 mL at 03/24/12 2243  . metoprolol succinate (TOPROL-XL) 24 hr tablet 25 mg  25 mg Oral Daily Shuvon Rankin, NP   25 mg at 03/26/12 0810  .  nicotine (NICODERM CQ - dosed in mg/24 hr) patch 7 mg  7 mg Transdermal Daily Rachael Fee, MD   7 mg at 03/26/12 1610  . QUEtiapine (SEROQUEL XR) 24 hr tablet 150 mg  150 mg Oral QHS Shuvon Rankin, NP   150 mg at 03/25/12 2157    Lab Results: No results found for this or any previous visit (from the past 48 hour(s)).  Physical Findings: AIMS: Facial and Oral Movements Muscles of Facial Expression: None, normal Lips and Perioral Area: None, normal Jaw: None, normal Tongue: None, normal,Extremity Movements Upper (arms, wrists, hands, fingers): None, normal Lower (legs, knees, ankles, toes): None, normal, Trunk Movements Neck, shoulders, hips: None, normal, Overall Severity Severity of  abnormal movements (highest score from questions above): None, normal Incapacitation due to abnormal movements: None, normal Patient's awareness of abnormal movements (rate only patient's report): No Awareness, Dental Status Current problems with teeth and/or dentures?: No Does patient usually wear dentures?: No  CIWA:  CIWA-Ar Total: 2 COWS:  COWS Total Score: 1  Treatment Plan Summary: Daily contact with patient to assess and evaluate symptoms and progress in treatment Medication management  Plan: Supportive approach/coping skills/relapse prevention/CBT           Increase the Vistaril to every 4 hours                                   Tylenol 650 to every 4 hours           Explore placement options Medical Decision Making Problem Points:  Review of last therapy session (1) and Review of psycho-social stressors (1) Data Points:  Review of medication regiment & side effects (2)  I certify that inpatient services furnished can reasonably be expected to improve the patient's condition.   LUGO,IRVING A 03/26/2012, 7:38 PM

## 2012-03-27 DIAGNOSIS — F411 Generalized anxiety disorder: Secondary | ICD-10-CM

## 2012-03-27 MED ORDER — QUETIAPINE FUMARATE ER 50 MG PO TB24
50.0000 mg | ORAL_TABLET | Freq: Every day | ORAL | Status: DC
  Administered 2012-03-27 – 2012-03-29 (×3): 50 mg via ORAL
  Filled 2012-03-27: qty 14
  Filled 2012-03-27 (×4): qty 1

## 2012-03-27 MED ORDER — ASPIRIN EC 325 MG PO TBEC
325.0000 mg | DELAYED_RELEASE_TABLET | Freq: Every day | ORAL | Status: DC
  Administered 2012-03-28 – 2012-03-29 (×2): 325 mg via ORAL
  Filled 2012-03-27 (×4): qty 1

## 2012-03-27 MED ORDER — GABAPENTIN 100 MG PO CAPS
100.0000 mg | ORAL_CAPSULE | Freq: Three times a day (TID) | ORAL | Status: DC
  Administered 2012-03-27 – 2012-03-28 (×4): 100 mg via ORAL
  Filled 2012-03-27 (×6): qty 1

## 2012-03-27 NOTE — Progress Notes (Signed)
Recreation Therapy Notes  Date: 03.05.2014  Time: 2:45pm  Location: [address] Day Room   Group Topic/Focus: Animal Assist Activities/Therapy (AAA/T)   Participation Level:  Active   Participation Quality:  Appropriate   Affect:  Euthymic   Cognitive:  Appropriate   Additional Comments: Patient pet and visited with Summer, the dog. Patient fed summer a treat. Patient interacted appropriately with LRT, peers and dog team.   Denise L Blanchfield, LRT/CTRS  Blanchfield, Denise L 03/27/2012 4:11 PM 

## 2012-03-27 NOTE — Progress Notes (Signed)
Adult Psychoeducational Group Note  Date:  03/27/2012 Time:  10:15 PM  Group Topic/Focus:  AA group  Participation Level:  Active  Participation Quality:  Appropriate  Affect:  Appropriate  Cognitive:  Alert  Insight: Appropriate  Engagement in Group:  Improving  Modes of Intervention:  Discussion  Additional Comments:    Flonnie Hailstone 03/27/2012, 10:15 PM

## 2012-03-27 NOTE — Progress Notes (Signed)
Discharge Planning Group (Late Entry)  03/26/2012 8:45 AM  Participation Quality:  Appropriate  Affect: Appropriate  Cognitive: Alert and Oriented  Insight:  Improving  Engagement in Group: Developing  Modes of Intervention: Clarification, exploration and support  Summary of Progress/Problems:  Pt denies both suicidal and homicidal ideation.  On a scale of 1 to 10 with ten being the most ever experienced, the patient rates depression at a 1 and anxiety at a 7. Patient expressed concern over where he will live once discharged as roommate insists he cannot return to stay long term.  Patient reports a friend is looking for new rental and asking roommate if he can return short term.  Clide Dales 03/27/2012 9:10 AM

## 2012-03-27 NOTE — Progress Notes (Signed)
[facility] LCSW Aftercare Discharge Planning Group Note  03/27/2012 11:00 AM Participation Quality:  Appropriate  Affect:  Appropriate  Cognitive:  Alert and Oriented  Insight:  Developing/Improving  Engagement in Group:  Monopolizing  Modes of Intervention:  Exploration, Limit-setting, Problem-solving and Support  Summary of Progress/Problems:  Pt denies both suicidal and homicidal ideation.  On a scale of 1 to 10 with ten being the most ever experienced, the patient rates depression at a 2 and anxiety at an 8.  Patient reports willingness to follow up at [facility] and also reports roommate has agreed to help get his belongings out once of the home.  Currently patient expects friend to pick him up at discharge on Friday and pat will move into extended stay residence near his friends home.   Clide Dales 03/27/2012, 6:33 PM

## 2012-03-27 NOTE — Progress Notes (Signed)
[facility] LCSW Group Therapy  03/27/2012  Type of Therapy:  Group Therapy from 1:15 to 2:30 PM  Participation Level:  Active  Participation Quality:  Intrusive and monopolizing  Affect:  Anxious  Cognitive:  Oriented  Insight:  Developing/Improving  Engagement in Therapy:  Engaged   Modes of Intervention:  Discussion, Exploration, Problem-solving, Limit setting and Support  Summary of Progress/Problems: The focus of this group session was to process how we deal with difficult emotions and share with others the patterns that play out when we are reacting to the emotion verses the situation.  Patients agreed that one of the most difficult emotions to regulate is anger and further processed that anger is usually a reaction to an underlying  emotion. [name] responded to multiple limits as he expressed need to comment on everything that was said in group.    [name] 03/27/2012,7:09 PM

## 2012-03-27 NOTE — Progress Notes (Signed)
Due to a issue that arised 11 am psycho educational group was moved to 8:45 am. This mornings group topic was Personal Development. Staff and patients were able to discuss how to decide and how to make rational decisions in and out of hospital environment. Patients were encouraged to come up with identifying values that pertain to that patient and values that they would like to work on. Staff also encouraged patients to come up with a goal of the day to encourage patients to become a better person and that apply to there daily living skills. Patient was cooperative and interactive in group. 

## 2012-03-27 NOTE — Progress Notes (Signed)
Patient ID: [name], male   DOB: Nov 03, 1950, 62 y.o.   MRN: [id number] [facility] MD Progress Note  03/27/2012 12:39 PM [name]  MRN:  [id number]  Subjective:  [name] endorses that he is having a lot of anxiety today. He states that he has a lot in his mind. Worried that his pain episodes, coupled with his anxiety may drive him back to drinking again after discharge. He says he is worried because his doctor told him that he could get some metal poison from the screw used to join the bones to his hip during his surgery. Requested for an addition of Seroquel XR 50 mg during the day time as it had worked for him in the past to manage his anxiety. Rated anxiety at #8. Feels like his stomach is in knots or coiled up due to increased anxiety".  Diagnosis:  Alcohol Dependence, Anxiety Disorder NOS  ADL's:  Intact  Sleep: Fair  Appetite:  Fair  Suicidal Ideation:  Plan:  Denies Intent:  Denies Means:  Denies Homicidal Ideation:  Plan:  Denies Intent:  Denies Means:  Denies  AEB (as evidenced by): Per patient's reports  Psychiatric Specialty Exam: Review of Systems  Constitutional: Negative.   HENT: Negative.   Eyes: Negative.   Respiratory: Negative.   Cardiovascular: Negative.   Gastrointestinal: Negative.   Genitourinary: Negative.   Musculoskeletal: Positive for myalgias, back pain and joint pain.  Skin: Negative.   Neurological: Negative.   Endo/Heme/Allergies: Negative.   Psychiatric/Behavioral: Positive for depression and substance abuse. The patient is nervous/anxious.     Blood pressure 126/84, pulse 81, temperature 98.3 F (36.8 C), temperature source Oral, resp. rate 16, height 5' 8.5" (1.74 m), weight 86.864 kg (191 lb 8 oz).Body mass index is 28.69 kg/(m^2).  General Appearance: Fairly Groomed  Patent attorney::  Fair  Speech:  Clear and Coherent and rapid  Volume:  Normal  Mood:  Anxious, Depressed and worried  Affect:  anxious  Thought Process:  Coherent and Goal  Directed  Orientation:  Full (Time, Place, and Person)  Thought Content:  worries, concerns  Suicidal Thoughts:  No  Homicidal Thoughts:  No  Memory:  Immediate;   Fair Recent;   Fair Remote;   Fair  Judgement:  Fair  Insight:  Present  Psychomotor Activity:  Restlessness, anxious  Concentration:  Fair  Recall:  Fair  Akathisia:  No  Handed:  Right  AIMS (if indicated):     Assets:  Desire for Improvement  Sleep:  Number of Hours: 4.25   Current Medications: Current Facility-Administered Medications  Medication Dose Route Frequency Provider Last Rate Last Dose  . acetaminophen (TYLENOL) tablet 650 mg  650 mg Oral Q4H PRN Rachael Fee, MD   650 mg at 03/27/12 0800  . alum & mag hydroxide-simeth (MAALOX/MYLANTA) 200-200-20 MG/5ML suspension 30 mL  30 mL Oral Q4H PRN Shuvon Rankin, NP      . [START ON 03/28/2012] aspirin EC tablet 325 mg  325 mg Oral Daily Rachael Fee, MD      . atorvastatin (LIPITOR) tablet 40 mg  40 mg Oral Daily Shuvon Rankin, NP   40 mg at 03/27/12 0755  . buPROPion (WELLBUTRIN XL) 24 hr tablet 300 mg  300 mg Oral Daily Shuvon Rankin, NP   300 mg at 03/27/12 0757  . gabapentin (NEURONTIN) capsule 100 mg  100 mg Oral TID Sanjuana Kava, NP      . hydrOXYzine (ATARAX/VISTARIL) tablet 25 mg  25 mg Oral Q4H PRN Rachael Fee, MD   25 mg at 03/27/12 0800  . magnesium hydroxide (MILK OF MAGNESIA) suspension 30 mL  30 mL Oral Daily PRN Shuvon Rankin, NP   30 mL at 03/24/12 2243  . metoprolol succinate (TOPROL-XL) 24 hr tablet 25 mg  25 mg Oral Daily Shuvon Rankin, NP   25 mg at 03/27/12 0756  . nicotine (NICODERM CQ - dosed in mg/24 hr) patch 7 mg  7 mg Transdermal Daily Rachael Fee, MD   7 mg at 03/27/12 0757  . QUEtiapine (SEROQUEL XR) 24 hr tablet 150 mg  150 mg Oral QHS Shuvon Rankin, NP   150 mg at 03/26/12 2151  . QUEtiapine (SEROQUEL XR) 24 hr tablet 50 mg  50 mg Oral Daily Sanjuana Kava, NP        Lab Results: No results found for this or any previous visit  (from the past 48 hour(s)).  Physical Findings: AIMS: Facial and Oral Movements Muscles of Facial Expression: None, normal Lips and Perioral Area: None, normal Jaw: None, normal Tongue: None, normal,Extremity Movements Upper (arms, wrists, hands, fingers): None, normal Lower (legs, knees, ankles, toes): None, normal, Trunk Movements Neck, shoulders, hips: None, normal, Overall Severity Severity of abnormal movements (highest score from questions above): None, normal Incapacitation due to abnormal movements: None, normal Patient's awareness of abnormal movements (rate only patient's report): No Awareness, Dental Status Current problems with teeth and/or dentures?: No Does patient usually wear dentures?: No  CIWA:  CIWA-Ar Total: 2 COWS:  COWS Total Score: 1  Treatment Plan Summary: Daily contact with patient to assess and evaluate symptoms and progress in treatment Medication management  Plan: Supportive approach/coping skills/relapse prevention. Will initiate Gabapentin 100 mg tid for symptoms of anxiety/pain control. Add Seroquel XR 50 mg daily for anxiety/mood control. Encouraged out of room, participation in group sessions and application of coping skills when distressed. Will continue to monitor response to/adverse effects of medications in use to assure effectiveness. Continue to monitor mood, behavior and interaction with staff and other patients. Continue current plan of care. Explore placement options  Medical Decision Making Problem Points:  Review of last therapy session (1) and Review of psycho-social stressors (1) Data Points:  Review of medication regiment & side effects (2)  I certify that inpatient services furnished can reasonably be expected to improve the patient's condition.   Armandina Stammer I 03/27/2012, 12:39 PM

## 2012-03-27 NOTE — Progress Notes (Signed)
Patient ID: [name], male   DOB: [birth year], 62 y.o.   MRN: [id number]  D: Pt was pleasant and cooperative. Pt's conversation was somewhat superficial, and had to be redirected several times. However, pt did eventually tell the writer that he'd had a pretty good day. Pt is positive for groups and voiced no questions or concerns.   A:  Support and encouragement was offered. 15 min checks continued for safety.  R: Pt remains safe.

## 2012-03-27 NOTE — Progress Notes (Signed)
[facility] LCSW Group Therapy  03/26/2012 1:15 to 2:30 PM   Type of Therapy:  Group Therapy  Participation Level: Appropriate  Participation Quality: Attentive and Sharing  Affect:  Appropriate  Cognitive:  Appropriate  Insight: Good  Engagement in Therapy:  Engaged  Modes of Intervention:  Orientation, Discussion, Exploration, Socialization and Support  Summary of Progress/Problems:Focus of this processing group was feelings about diagnosis; discussion included denial, bargaining, anger, depression, and acceptance to which patients related to in varying degrees.  [name] has had extensive experience with early sobriety and was able to share with others how these feelings can keep [place] stuck or be used for motivation.  Patient also provided examples of coping tools one can use in early sobriety.     [name]

## 2012-03-27 NOTE — Progress Notes (Signed)
Patient ID: [name], male   DOB: 1950/04/11, 62 y.o.   MRN: [id number]  D: Pt was pleasant and cooperative. When asked about his day, he replied that it was a good day. When asked why it was a good day, pt stated that the new NP prescribed him seroquel and neurontin. "You know my stomach feels like its in coils, sometimes." Stated the vistaril "wears off so quick".  Pt voiced no other concerns or complaints.   A:  Support and encouragement was offered. 15 min checks continued for safety.  R: Pt remains safe.

## 2012-03-28 DIAGNOSIS — F329 Major depressive disorder, single episode, unspecified: Secondary | ICD-10-CM

## 2012-03-28 DIAGNOSIS — F411 Generalized anxiety disorder: Secondary | ICD-10-CM

## 2012-03-28 MED ORDER — GABAPENTIN 100 MG PO CAPS
200.0000 mg | ORAL_CAPSULE | Freq: Three times a day (TID) | ORAL | Status: DC
  Administered 2012-03-28 – 2012-03-29 (×3): 200 mg via ORAL
  Filled 2012-03-28: qty 2
  Filled 2012-03-28: qty 42
  Filled 2012-03-28: qty 2
  Filled 2012-03-28: qty 42
  Filled 2012-03-28 (×2): qty 2
  Filled 2012-03-28: qty 42
  Filled 2012-03-28: qty 2

## 2012-03-28 MED ORDER — IBUPROFEN 600 MG PO TABS
600.0000 mg | ORAL_TABLET | Freq: Four times a day (QID) | ORAL | Status: DC | PRN
  Administered 2012-03-28 – 2012-03-29 (×3): 600 mg via ORAL
  Filled 2012-03-28 (×3): qty 1

## 2012-03-28 NOTE — Progress Notes (Signed)
D: Patient appropriate and cooperative with staff and peers. Patient's affect/mood is anxious. Patient complained of left hip pain 7/10; also c/o anxiety 8/10. He reported on the self inventory sheet that his sleep is poor, appetite is good, energy level is low and ability to pay attention is improving. Patient rated depression "1" and feelings of hopelessness "10". He verbalized that he's feeling really anxious about discharging tomorrow and that he has a lot of thoughts racing in his mind.  A: Support and encouragement provided to patient. Scheduled medications administered per MD orders. PRN Tylenol given for pain and Vistaril for anxiety. Maintain Q15 minute checks for safety.  R: Patient receptive. Patient reported decrease in pain and anxiety 3/10. Denies SI/HI/AVH. Patient remains safe.

## 2012-03-28 NOTE — Progress Notes (Signed)
[facility] MD Progress Note  03/28/2012 5:13 PM [name]  MRN:  [id number] Subjective:  [name] is planning to go to an extended stay hotel and then go to an assisted living facility while he recovers fully. He has gotten benefit from the Neurontin. Still with anxiety. Admits he tends to worry and he is catching himself when he does it. Also feels that the Seroquel has helped to stabilize his mood. He is committed to abstinence, and do everything he can to continue to abstain Diagnosis:  Alcohol Dependence, GAD, Depressive Disorder NOS  ADL's:  Intact  Sleep: Fair  Appetite:  Fair  Suicidal Ideation:  Plan:  denies Intent:  denies Means:  denies Homicidal Ideation:  Plan:  denies Intent:  denies Means:  denies AEB (as evidenced by):  Psychiatric Specialty Exam: Review of Systems  Constitutional: Negative.   HENT: Positive for neck pain.   Eyes: Negative.   Respiratory: Negative.   Cardiovascular: Negative.   Gastrointestinal: Negative.   Genitourinary: Negative.   Musculoskeletal: Positive for joint pain.  Skin: Negative.   Neurological: Negative.   Endo/Heme/Allergies: Negative.   Psychiatric/Behavioral: The patient is nervous/anxious.     Blood pressure 121/92, pulse 87, temperature 98 F (36.7 C), temperature source Oral, resp. rate 16, height 5' 8.5" (1.74 m), weight 86.864 kg (191 lb 8 oz).Body mass index is 28.69 kg/(m^2).  General Appearance: Fairly Groomed  Patent attorney::  Fair  Speech:  Clear and Coherent  Volume:  Normal  Mood:  Anxious  Affect:  anxious  Thought Process:  Coherent and Goal Directed  Orientation:  Full (Time, Place, and Person)  Thought Content:  worries, concerns  Suicidal Thoughts:  No  Homicidal Thoughts:  No  Memory:  Immediate;   Fair Recent;   Fair Remote;   Fair  Judgement:  Fair  Insight:  Present  Psychomotor Activity:  Restlessness  Concentration:  Fair  Recall:  Fair  Akathisia:  No  Handed:  Right  AIMS (if indicated):      Assets:  Desire for Improvement  Sleep:  Number of Hours: 3.25   Current Medications: Current Facility-Administered Medications  Medication Dose Route Frequency Provider Last Rate Last Dose  . alum & mag hydroxide-simeth (MAALOX/MYLANTA) 200-200-20 MG/5ML suspension 30 mL  30 mL Oral Q4H PRN Shuvon Rankin, NP      . aspirin EC tablet 325 mg  325 mg Oral Daily Rachael Fee, MD   325 mg at 03/28/12 0826  . atorvastatin (LIPITOR) tablet 40 mg  40 mg Oral Daily Shuvon Rankin, NP   40 mg at 03/28/12 0825  . buPROPion (WELLBUTRIN XL) 24 hr tablet 300 mg  300 mg Oral Daily Shuvon Rankin, NP   300 mg at 03/28/12 0826  . gabapentin (NEURONTIN) capsule 200 mg  200 mg Oral TID Rachael Fee, MD   200 mg at 03/28/12 1707  . hydrOXYzine (ATARAX/VISTARIL) tablet 25 mg  25 mg Oral Q4H PRN Rachael Fee, MD   25 mg at 03/28/12 1602  . ibuprofen (ADVIL,MOTRIN) tablet 600 mg  600 mg Oral Q6H PRN Rachael Fee, MD   600 mg at 03/28/12 1602  . magnesium hydroxide (MILK OF MAGNESIA) suspension 30 mL  30 mL Oral Daily PRN Shuvon Rankin, NP   30 mL at 03/24/12 2243  . metoprolol succinate (TOPROL-XL) 24 hr tablet 25 mg  25 mg Oral Daily Shuvon Rankin, NP   25 mg at 03/28/12 0825  . nicotine (NICODERM CQ -  dosed in mg/24 hr) patch 7 mg  7 mg Transdermal Daily Rachael Fee, MD   7 mg at 03/28/12 4782  . QUEtiapine (SEROQUEL XR) 24 hr tablet 150 mg  150 mg Oral QHS Shuvon Rankin, NP   150 mg at 03/27/12 2233  . QUEtiapine (SEROQUEL XR) 24 hr tablet 50 mg  50 mg Oral Daily Sanjuana Kava, NP   50 mg at 03/28/12 0825    Lab Results: No results found for this or any previous visit (from the past 48 hour(s)).  Physical Findings: AIMS: Facial and Oral Movements Muscles of Facial Expression: None, normal Lips and Perioral Area: None, normal Jaw: None, normal Tongue: None, normal,Extremity Movements Upper (arms, wrists, hands, fingers): None, normal Lower (legs, knees, ankles, toes): None, normal, Trunk  Movements Neck, shoulders, hips: None, normal, Overall Severity Severity of abnormal movements (highest score from questions above): None, normal Incapacitation due to abnormal movements: None, normal Patient's awareness of abnormal movements (rate only patient's report): No Awareness, Dental Status Current problems with teeth and/or dentures?: No Does patient usually wear dentures?: No  CIWA:  CIWA-Ar Total: 2 COWS:  COWS Total Score: 1  Treatment Plan Summary: Daily contact with patient to assess and evaluate symptoms and progress in treatment Medication management  Plan: Supportive approach/coping skills/relapse prevention            Increase the Neurontin to 200 mg TID  Medical Decision Making Problem Points:  Review of psycho-social stressors (1) Data Points:  Review of medication regiment & side effects (2) Review of new medications or change in dosage (2)  I certify that inpatient services furnished can reasonably be expected to improve the patient's condition.   LUGO,IRVING A 03/28/2012, 5:13 PM

## 2012-03-28 NOTE — Progress Notes (Signed)
Adult Psychoeducational Group Note  Date:  03/28/2012 Time:  4:13 PM  Group Topic/Focus:  Overcoming Stress:   The focus of this group is to define stress and help patients assess their triggers.  Participation Level:  Minimal  Participation Quality:  Appropriate  Affect:  Appropriate  Cognitive:  Alert  Insight: Lacking  Engagement in Group:  Limited  Modes of Intervention:  Education and Support  Additional Comments:  Pt participated in group discussion and small group "stress interview." Pt identified that the primary stressor in his life is pain from his recent hip replacement. Pt identified that he will cope with this pain by taking Tylenol. Staff encouraged pt to incorporate mindfulness techniques for dealing with his pain, such as accepting and dwelling with his pain.    Reinaldo Raddle K 03/28/2012, 4:13 PM

## 2012-03-28 NOTE — Care Management Utilization Note (Signed)
   Per State Regulation 482.30  This chart was reviewed for necessity with respect to the patient's Admission/ Duration of stay.  Next review date: 04/01/12  Rudy Luhmann Morrison RN, BSN 

## 2012-03-28 NOTE — Progress Notes (Signed)
[facility] LCSW Group Therapy  03/28/2012   Type of Therapy:  Group Therapy from 1:15 to 2:30 PM  Participation Level:  Appropriate  Participation Quality: Appropriate  Affect:  Appropriate  Cognitive:  Alert and Oriented     Insight: Developing, Improving  Engagement in Therapy: Engaged    Modes of Intervention: Activity,  Exploration and support  Summary of Progress/Problems: Focus of group processing discussion was on balance in life; the components in life which have a negative influence on balance and the components which make for a more balanced life.  Patient shared that for him being in nature creates balance and also shared that he is looking forward to going to gym tomorrow after discharge to exercise as that also helps.  [name] was able to process that he "often glamorizes drinking as opposed to accepting that it creates havoc in my life."  Dyane Dustman, Julious Payer

## 2012-03-28 NOTE — Progress Notes (Signed)
[facility] LCSW Aftercare Discharge Planning Group Note  03/28/2012   Participation Quality: Did Not Attend   Clide Dales

## 2012-03-29 MED ORDER — GABAPENTIN 100 MG PO CAPS: 200.0000 mg | ORAL_CAPSULE | Freq: Three times a day (TID) | ORAL | Status: DC

## 2012-03-29 MED ORDER — ASPIRIN EC 325 MG PO TBEC: 325.0000 mg | DELAYED_RELEASE_TABLET | Freq: Every morning | ORAL | Status: DC

## 2012-03-29 MED ORDER — BUPROPION HCL ER (XL) 300 MG PO TB24: 300.0000 mg | ORAL_TABLET | Freq: Every day | ORAL | Status: DC

## 2012-03-29 MED ORDER — HYDROXYZINE HCL 25 MG PO TABS: 25.0000 mg | ORAL_TABLET | ORAL | Status: DC | PRN

## 2012-03-29 MED ORDER — METOPROLOL SUCCINATE ER 25 MG PO TB24: 25.0000 mg | ORAL_TABLET | Freq: Every day | ORAL | Status: DC

## 2012-03-29 MED ORDER — QUETIAPINE FUMARATE ER 50 MG PO TB24: ORAL_TABLET | ORAL | Status: DC

## 2012-03-29 MED ORDER — ATORVASTATIN CALCIUM 40 MG PO TABS: 40.0000 mg | ORAL_TABLET | Freq: Every day | ORAL | Status: DC

## 2012-03-29 NOTE — Progress Notes (Signed)
Nrsg DC Pt given DC AVS, stated he understood and that he will comply with F/U as scheduled. He denied SI, HI, and / or presence of audit, vis, tactile halluc. He is given sample meds as well as prescriptions for these meds. Belongings from his locker are returned to him and he is escorted to bldg entrance and dc'd home

## 2012-03-29 NOTE — Progress Notes (Signed)
[facility] Adult Case Management Discharge Plan :  Will you be returning to the same living situation after discharge: No. Patient will be staying at [facility] until he locates a suitable apartment as his roommate is not comfortable with him returning.  At discharge, do you have transportation home?:Yes,  a good friend Do you have the ability to pay for your medications:Yes,  affordable cost through [facility].   Release of information consent forms completed and in the chart;  [name].  Patient to Follow up at: Follow-up Information   Follow up with [facility]  On 04/09/2012. (Appointment at [facility] on Tuesday 04/09/12)    Contact information:   [address] [place]  [address] [facility] [phone]      Patient denies SI/HI:   Yes,  denies both    Safety Planning and Suicide Prevention discussed:  Yes,  with patient  Clide Dales 03/29/2012, 5:59 PM

## 2012-03-29 NOTE — Progress Notes (Signed)
D   Pt is alert and oriented  He reports feeling so much better since he came to the hospital and said he has learned a lot about his illness and coping skills   He reports being ready for discharge   Pt is cooperative and appropriate in his interactions with others A   Verbal support given   Medications administered and effectiveness monitored  Q 15 min checks R   Pt safe at present

## 2012-03-29 NOTE — [facility] Suicide Risk Assessment (Signed)
Suicide Risk Assessment  Discharge Assessment     Demographic Factors:  Male, Caucasian and Unemployed  Mental Status Per Nursing Assessment::   On Admission:     Current Mental Status by Physician: In full contact with reality. There are no suicidal ideas, plans or intent. His mood is euthymic. His affect is appropriate. He is willing and motivated to pursue outpatient treatment   Loss Factors: Loss of significant relationship and Decline in physical health  Historical Factors: NA  Risk Reduction Factors:   Positive social support  Continued Clinical Symptoms:  Depression:   Comorbid alcohol abuse/dependence Alcohol/Substance Abuse/Dependencies  Cognitive Features That Contribute To Risk: None identified    Suicide Risk:  Minimal: No identifiable suicidal ideation.  Patients presenting with no risk factors but with morbid ruminations; may be classified as minimal risk based on the severity of the depressive symptoms  Discharge Diagnoses:   AXIS I:  Alcohol Dependence, Anxiety Disorder NOS, Depressive Disorder NOS AXIS II:  Deferred AXIS III:   Past Medical History  Diagnosis Date  . MVP (mitral valve prolapse)   . Anxiety   . Hypertension   . Coronary artery disease     s/p PCI to RCA in 2007  . Hyperlipidemia   . Palpitations   . Bipolar disorder   . Depression   . Esophagitis   . Gastritis   . Insomnia   . Seizures   . Pneumonia 2005  . Arthritis    AXIS IV:  other psychosocial or environmental problems AXIS V:  61-70 mild symptoms  Plan Of Care/Follow-up recommendations:  Activity:  as tolerated Diet:  regular Follow up [facility] Is patient on multiple antipsychotic therapies at discharge:  No   Has Patient had three or more failed trials of antipsychotic monotherapy by history:  No  Recommended Plan for Multiple Antipsychotic Therapies: N/A   LUGO,IRVING A 03/29/2012, 12:00 PM

## 2012-03-29 NOTE — Progress Notes (Signed)
Patient did attend the evening karaoke group.  Patient was engaged, supportive, and participated by singing a song.   

## 2012-03-29 NOTE — Discharge Summary (Signed)
Physician Discharge Summary Note  Patient:  [name] is an 62 y.o., male MRN:  [id number] DOB:  1950-10-25 Patient phone:  [phone] (home)  Patient address:   [address] [place] [address] [place] [address],   Date of Admission:  03/22/2012 Date of Discharge: 03/29/12  Reason for Admission:  Alcohol abuse  Discharge Diagnoses: Active Problems:   Polysubstance abuse   Alcohol dependence  Review of Systems  Constitutional: Negative.   HENT: Negative.   Eyes: Negative.   Respiratory: Positive for hemoptysis.   Cardiovascular: Negative.   Gastrointestinal: Negative.   Genitourinary: Negative.   Musculoskeletal: Positive for myalgias, back pain and joint pain.  Skin: Negative.   Neurological: Negative.   Endo/Heme/Allergies: Negative.   Psychiatric/Behavioral: Positive for depression (Stabilized with medication prior to discharge), hallucinations and substance abuse (Alcoholism). Negative for suicidal ideas and memory loss. The patient is nervous/anxious (Stabilized with medication prior to discharge) and has insomnia (Stabilized with medication prior to discharge.).    Axis Diagnosis:   AXIS I:  Polysubstance abuse, Alcohol dependence AXIS II:  Deferred AXIS III:   Past Medical History  Diagnosis Date  . MVP (mitral valve prolapse)   . Anxiety   . Hypertension   . Coronary artery disease     s/p PCI to RCA in 2007  . Hyperlipidemia   . Palpitations   . Bipolar disorder   . Depression   . Esophagitis   . Gastritis   . Insomnia   . Seizures   . Pneumonia 2005  . Arthritis    AXIS IV:  Substance abuse problems. AXIS V:  64  Level of Care:  OP  Hospital Course:  Patient states that he got addicted to pain killers after his hip surgery and when he was unable to get any more medication because the prescriptions had ran out and was unable to get refills he turned to alcohol and knew that he needed help.   Upon admission in this hospital and after  admission assessment/evaluation, It was determined that Mr. Pasley is intoxicated and will need detoxification treatment to stabilize his system from alcohol intoxication and to combat the withdrawal symptoms of alcohol.  He was started on Librium protocol for his alcohol detoxification. He was also enrolled in group counseling sessions and activities to learn coping skills that should help him to cope better after discharge and to manage his substance abuse for a longer sobriety. He also enrolled/attended AA/NA meetings being offered and held on this unit. He does have some previous and or identifiable medical conditions that required treatment or monitoring. He  received medication management for all those health issues as well. He was monitored closely for any potential problems that may arise as a result of and or during detoxification treatment. Patient tolerated his treatment regimen and detoxification treatment without any significant adverse effects and or reactions presented.  Patient attended treatment team meeting this am and met with the treatment team members. His reason for admission, symptoms, substance abuse issues, response to treatment and discharge plans discussed. Patient endorsed that he is doing well and stable for discharge to pursue the next phase of his substance abuse treatment. He was encouraged to join/attend AA/NA meetings , get a trusted sponsor from the advise of others or from whomever within the AA meetings seems to make sense, and has a proven track record, and will hold him responsible for his sobriety, and both expects and insists on total his abstinence from alcohol. And  for the mean time, he will follow-up psychiatric care on an outpatient basis at [facility] psychiatric clinic here in [place], [place] on 04/09/12 at 02:00 pm. The address, date and time for this appointment provided for patient is writing.  Mr. Rietz is currently being discharged to his home.  Upon discharge, he  adamantly denies suicidal, homicidal ideations, auditory, visual hallucinations, delusional thinking and or withdrawal symptoms. Patient left [facility] with all personal belongings in no apparent distress. He received 4 days worth samples of his discharge medications. Transportation per family.   Consults:  Cardiology  Significant Diagnostic Studies:  labs: CBC with diff, CMP, UDS, Toxicology tests  Discharge Vitals:   Blood pressure 118/84, pulse 79, temperature 97.2 F (36.2 C), temperature source Oral, resp. rate 20, height 5' 8.5" (1.74 m), weight 86.864 kg (191 lb 8 oz). Body mass index is 28.69 kg/(m^2). Lab Results:   No results found for this or any previous visit (from the past 72 hour(s)).  Physical Findings: AIMS: Facial and Oral Movements Muscles of Facial Expression: None, normal Lips and Perioral Area: None, normal Jaw: None, normal Tongue: None, normal,Extremity Movements Upper (arms, wrists, hands, fingers): None, normal Lower (legs, knees, ankles, toes): None, normal, Trunk Movements Neck, shoulders, hips: None, normal, Overall Severity Severity of abnormal movements (highest score from questions above): None, normal Incapacitation due to abnormal movements: None, normal Patient's awareness of abnormal movements (rate only patient's report): No Awareness, Dental Status Current problems with teeth and/or dentures?: No Does patient usually wear dentures?: No  CIWA:  CIWA-Ar Total: 2 COWS:  COWS Total Score: 1  Psychiatric Specialty Exam: See Psychiatric Specialty Exam and Suicide Risk Assessment completed by Attending Physician prior to discharge.  Discharge destination:  Home  Is patient on multiple antipsychotic therapies at discharge:  No   Has Patient had three or more failed trials of antipsychotic monotherapy by history:  No  Recommended Plan for Multiple Antipsychotic Therapies: NA      Discharge Orders   Future Orders Complete By Expires     Diet - low  sodium heart healthy  As directed     Increase activity slowly  As directed         Medication List    TAKE these medications     Indication   aspirin EC 325 MG tablet  Take 1 tablet (325 mg total) by mouth every morning. For blood thinner/heart heart   Indication:  Blood thinner for heart heart     atorvastatin 40 MG tablet  Commonly known as:  LIPITOR  Take 1 tablet (40 mg total) by mouth daily. For cholesterol   Indication:  Inherited Homozygous Hypercholesterolemia     buPROPion 300 MG 24 hr tablet  Commonly known as:  WELLBUTRIN XL  Take 1 tablet (300 mg total) by mouth daily. For depression   Indication:  Major Depressive Disorder     gabapentin 100 MG capsule  Commonly known as:  NEURONTIN  Take 2 capsules (200 mg total) by mouth 3 (three) times daily. For anxiety   Indication:  For anxiety     hydrOXYzine 25 MG tablet  Commonly known as:  ATARAX/VISTARIL  Take 1 tablet (25 mg total) by mouth every 4 (four) hours as needed for anxiety.   Indication:  Anxiety Neurosis, Anxiety associated with Organic Disease     metoprolol succinate 25 MG 24 hr tablet  Commonly known as:  TOPROL-XL  Take 1 tablet (25 mg total) by mouth daily. For blood pressure  Indication:  High Blood Pressure     QUEtiapine 50 MG Tb24  Commonly known as:  SEROQUEL XR  Take 1 tablet (50mg )  every morning and 3 tablets (150mg ) at bedtime for thoughts/mood   Indication:  Manic-Depression       Follow-up Information   Follow up with [facility]  On 04/09/2012. (Appointment at [facility] on Tuesday 04/09/12)    Contact information:   [address] [place]  [address] [facility] [phone]      Follow-up recommendations:  Activity:  As tolerated Other: Diet as recommended by your doctor.  Keep all scheduled follow-up appointments as recommended.   Comments: Take all your medications as prescribed by your mental healthcare Taleyah Hillman. Report any adverse effects and or reactions from your  medicines to your outpatient Shonya Sumida promptly. Patient is instructed and cautioned to not engage in alcohol and or illegal drug use while on prescription medicines. In the event of worsening symptoms, patient is instructed to call the crisis hotline, 911 and or go to the nearest ED for appropriate evaluation and treatment of symptoms. Follow-up with your primary care Jennelle Pinkstaff for your other medical issues, concerns and or health care needs.  Continue to work the relapse prevention plan  Total Discharge Time: Greater than 30 minutes  Signed: Armandina Stammer I 03/29/2012, 3:23 PM

## 2012-04-03 NOTE — Progress Notes (Signed)
Patient Discharge Instructions:  After Visit Summary (AVS):   Faxed to:  04/03/12 Discharge Summary Note:   Faxed to:  04/03/12 Psychiatric Admission Assessment Note:   Faxed to:  04/03/12 Suicide Risk Assessment - Discharge Assessment:   Faxed to:  04/03/12 Faxed/Sent to the Next Level Care provider:  04/03/12 Faxed to [facility] @ [phone]  Jerelene Redden, 04/03/2012, 3:59 PM

## 2012-09-19 ENCOUNTER — Encounter (HOSPITAL_COMMUNITY): Payer: Self-pay | Admitting: Nurse Practitioner

## 2012-09-19 ENCOUNTER — Inpatient Hospital Stay (HOSPITAL_COMMUNITY)
Admission: EM | Admit: 2012-09-19 | Discharge: 2012-09-20 | DRG: 313 | Disposition: A | Discharge status: Home / Self Care | Payer: Medicare Other | Attending: Internal Medicine | Admitting: Internal Medicine

## 2012-09-19 ENCOUNTER — Emergency Department (HOSPITAL_COMMUNITY): Payer: Medicare Other

## 2012-09-19 DIAGNOSIS — F411 Generalized anxiety disorder: Secondary | ICD-10-CM | POA: Diagnosis present

## 2012-09-19 DIAGNOSIS — I498 Other specified cardiac arrhythmias: Secondary | ICD-10-CM | POA: Diagnosis present

## 2012-09-19 DIAGNOSIS — R000 Tachycardia, unspecified: Secondary | ICD-10-CM | POA: Diagnosis present

## 2012-09-19 DIAGNOSIS — E871 Hypo-osmolality and hyponatremia: Secondary | ICD-10-CM

## 2012-09-19 DIAGNOSIS — F17200 Nicotine dependence, unspecified, uncomplicated: Secondary | ICD-10-CM | POA: Diagnosis present

## 2012-09-19 DIAGNOSIS — F329 Major depressive disorder, single episode, unspecified: Secondary | ICD-10-CM | POA: Diagnosis present

## 2012-09-19 DIAGNOSIS — Z7982 Long term (current) use of aspirin: Secondary | ICD-10-CM

## 2012-09-19 DIAGNOSIS — R079 Chest pain, unspecified: Secondary | ICD-10-CM

## 2012-09-19 DIAGNOSIS — I10 Essential (primary) hypertension: Secondary | ICD-10-CM

## 2012-09-19 DIAGNOSIS — E785 Hyperlipidemia, unspecified: Secondary | ICD-10-CM

## 2012-09-19 DIAGNOSIS — D509 Iron deficiency anemia, unspecified: Secondary | ICD-10-CM | POA: Diagnosis present

## 2012-09-19 DIAGNOSIS — I059 Rheumatic mitral valve disease, unspecified: Secondary | ICD-10-CM | POA: Diagnosis present

## 2012-09-19 DIAGNOSIS — Z9861 Coronary angioplasty status: Secondary | ICD-10-CM

## 2012-09-19 DIAGNOSIS — Z79899 Other long term (current) drug therapy: Secondary | ICD-10-CM

## 2012-09-19 DIAGNOSIS — R0789 Other chest pain: Principal | ICD-10-CM | POA: Diagnosis present

## 2012-09-19 DIAGNOSIS — I2510 Atherosclerotic heart disease of native coronary artery without angina pectoris: Secondary | ICD-10-CM

## 2012-09-19 DIAGNOSIS — Z96649 Presence of unspecified artificial hip joint: Secondary | ICD-10-CM

## 2012-09-19 DIAGNOSIS — D649 Anemia, unspecified: Secondary | ICD-10-CM

## 2012-09-19 DIAGNOSIS — F419 Anxiety disorder, unspecified: Secondary | ICD-10-CM | POA: Diagnosis present

## 2012-09-19 DIAGNOSIS — F319 Bipolar disorder, unspecified: Secondary | ICD-10-CM | POA: Diagnosis present

## 2012-09-19 HISTORY — DX: Pure hypercholesterolemia, unspecified: E78.00

## 2012-09-19 LAB — CBC WITH DIFFERENTIAL/PLATELET
Basophils Absolute: 0 10*3/uL (ref 0.0–0.1)
Basophils Relative: 1 % (ref 0–1)
Eosinophils Absolute: 0.4 10*3/uL (ref 0.0–0.7)
Eosinophils Relative: 7 % — ABNORMAL HIGH (ref 0–5)
HCT: 37.5 % — ABNORMAL LOW (ref 39.0–52.0)
Hemoglobin: 12.9 g/dL — ABNORMAL LOW (ref 13.0–17.0)
Lymphocytes Relative: 40 % (ref 12–46)
Lymphs Abs: 2.5 10*3/uL (ref 0.7–4.0)
MCH: 25.5 pg — ABNORMAL LOW (ref 26.0–34.0)
MCHC: 34.4 g/dL (ref 30.0–36.0)
MCV: 74.1 fL — ABNORMAL LOW (ref 78.0–100.0)
Monocytes Absolute: 0.7 10*3/uL (ref 0.1–1.0)
Monocytes Relative: 11 % (ref 3–12)
Neutro Abs: 2.7 10*3/uL (ref 1.7–7.7)
Neutrophils Relative %: 43 % (ref 43–77)
Platelets: 227 10*3/uL (ref 150–400)
RBC: 5.06 MIL/uL (ref 4.22–5.81)
RDW: 15.8 % — ABNORMAL HIGH (ref 11.5–15.5)
WBC: 6.3 10*3/uL (ref 4.0–10.5)

## 2012-09-19 LAB — CREATININE, SERUM
Creatinine, Ser: 0.77 mg/dL (ref 0.50–1.35)
GFR calc non Af Amer: >90 mL/min (ref >90)

## 2012-09-19 LAB — CBC
HCT: 36 % — ABNORMAL LOW (ref 39.0–52.0)
Hemoglobin: 12.2 g/dL — ABNORMAL LOW (ref 13.0–17.0)
MCH: 24.7 pg — ABNORMAL LOW (ref 26.0–34.0)
MCH: 25.3 pg — ABNORMAL LOW (ref 26.0–34.0)
MCHC: 33.2 g/dL (ref 30.0–36.0)
MCV: 74.2 fL — ABNORMAL LOW (ref 78.0–100.0)
Platelets: 285 10*3/uL (ref 150–400)
RBC: 4.83 MIL/uL (ref 4.22–5.81)
RDW: 15.7 % — ABNORMAL HIGH (ref 11.5–15.5)
WBC: 6.6 10*3/uL (ref 4.0–10.5)

## 2012-09-19 LAB — BASIC METABOLIC PANEL
Calcium: 9.3 mg/dL (ref 8.4–10.5)
Chloride: 85 mEq/L — ABNORMAL LOW (ref 96–112)
Creatinine, Ser: 0.67 mg/dL (ref 0.50–1.35)
GFR calc Af Amer: >90 mL/min (ref >90)
GFR calc non Af Amer: >90 mL/min (ref >90)

## 2012-09-19 LAB — POCT I-STAT TROPONIN I: Troponin i, poc: 0.03 ng/mL (ref 0.00–0.08)

## 2012-09-19 LAB — TROPONIN I: Troponin I: <0.3 ng/mL (ref <0.30)

## 2012-09-19 LAB — HEPATIC FUNCTION PANEL
ALT: 26 U/L (ref 0–53)
Albumin: 4.1 g/dL (ref 3.5–5.2)
Alkaline Phosphatase: 78 U/L (ref 39–117)
Total Bilirubin: 0.5 mg/dL (ref 0.3–1.2)
Total Protein: 7 g/dL (ref 6.0–8.3)

## 2012-09-19 LAB — MAGNESIUM: Magnesium: 2.2 mg/dL (ref 1.5–2.5)

## 2012-09-19 LAB — APTT: aPTT: 29 seconds (ref 24–37)

## 2012-09-19 MED ORDER — MORPHINE SULFATE 4 MG/ML IJ SOLN
4.0000 mg | Freq: Once | INTRAMUSCULAR | Status: AC
  Administered 2012-09-19: 4 mg via INTRAVENOUS
  Filled 2012-09-19: qty 1

## 2012-09-19 MED ORDER — PANTOPRAZOLE SODIUM 40 MG PO TBEC
40.0000 mg | DELAYED_RELEASE_TABLET | Freq: Every day | ORAL | Status: DC
  Administered 2012-09-20: 40 mg via ORAL
  Filled 2012-09-19: qty 1

## 2012-09-19 MED ORDER — BUPROPION HCL ER (XL) 300 MG PO TB24
300.0000 mg | ORAL_TABLET | Freq: Every day | ORAL | Status: DC
  Administered 2012-09-20: 300 mg via ORAL
  Filled 2012-09-19 (×2): qty 1

## 2012-09-19 MED ORDER — NITROGLYCERIN 0.4 MG SL SUBL: 0.4000 mg | SUBLINGUAL_TABLET | SUBLINGUAL | Status: DC | PRN

## 2012-09-19 MED ORDER — ENOXAPARIN SODIUM 40 MG/0.4ML ~~LOC~~ SOLN
40.0000 mg | SUBCUTANEOUS | Status: DC
  Administered 2012-09-19: 40 mg via SUBCUTANEOUS
  Filled 2012-09-19 (×2): qty 0.4

## 2012-09-19 MED ORDER — SODIUM CHLORIDE 0.9 % IV BOLUS (SEPSIS)
1000.0000 mL | Freq: Once | INTRAVENOUS | Status: AC
  Administered 2012-09-19: 1000 mL via INTRAVENOUS

## 2012-09-19 MED ORDER — SODIUM CHLORIDE 0.9 % IV SOLN
INTRAVENOUS | Status: DC
  Administered 2012-09-19 – 2012-09-20 (×2): via INTRAVENOUS

## 2012-09-19 MED ORDER — LORAZEPAM 2 MG/ML IJ SOLN
0.5000 mg | Freq: Once | INTRAMUSCULAR | Status: AC
  Administered 2012-09-19: 0.5 mg via INTRAVENOUS
  Filled 2012-09-19: qty 1

## 2012-09-19 MED ORDER — ONDANSETRON HCL 4 MG/2ML IJ SOLN
4.0000 mg | Freq: Once | INTRAMUSCULAR | Status: AC
  Administered 2012-09-19: 4 mg via INTRAVENOUS
  Filled 2012-09-19: qty 2

## 2012-09-19 MED ORDER — GABAPENTIN 100 MG PO CAPS
200.0000 mg | ORAL_CAPSULE | Freq: Three times a day (TID) | ORAL | Status: DC
  Administered 2012-09-19 – 2012-09-20 (×2): 200 mg via ORAL
  Filled 2012-09-19 (×5): qty 2

## 2012-09-19 MED ORDER — ZOLPIDEM TARTRATE 5 MG PO TABS
5.0000 mg | ORAL_TABLET | Freq: Every evening | ORAL | Status: DC | PRN
  Administered 2012-09-19: 5 mg via ORAL
  Filled 2012-09-19: qty 1

## 2012-09-19 MED ORDER — METOPROLOL TARTRATE 25 MG PO TABS: 25.0000 mg | ORAL_TABLET | Freq: Once | ORAL | Status: DC

## 2012-09-19 MED ORDER — SODIUM CHLORIDE 0.9 % IV BOLUS (SEPSIS)
500.0000 mL | Freq: Once | INTRAVENOUS | Status: AC
  Administered 2012-09-19: 500 mL via INTRAVENOUS

## 2012-09-19 MED ORDER — ATORVASTATIN CALCIUM 40 MG PO TABS
40.0000 mg | ORAL_TABLET | Freq: Every day | ORAL | Status: DC
  Administered 2012-09-20: 40 mg via ORAL
  Filled 2012-09-19 (×2): qty 1

## 2012-09-19 MED ORDER — ACETAMINOPHEN 325 MG PO TABS: 650.0000 mg | ORAL_TABLET | ORAL | Status: DC | PRN

## 2012-09-19 MED ORDER — SODIUM CHLORIDE 0.9 % IV BOLUS (SEPSIS): 1000.0000 mL | Freq: Once | INTRAVENOUS | Status: DC

## 2012-09-19 MED ORDER — HYDRALAZINE HCL 20 MG/ML IJ SOLN: 10.0000 mg | Freq: Four times a day (QID) | INTRAMUSCULAR | Status: DC | PRN

## 2012-09-19 MED ORDER — ONDANSETRON HCL 4 MG/2ML IJ SOLN: 4.0000 mg | Freq: Four times a day (QID) | INTRAMUSCULAR | Status: DC | PRN

## 2012-09-19 MED ORDER — HYDROXYZINE HCL 25 MG PO TABS
25.0000 mg | ORAL_TABLET | ORAL | Status: DC | PRN
  Filled 2012-09-19: qty 1

## 2012-09-19 MED ORDER — MORPHINE SULFATE 2 MG/ML IJ SOLN: 2.0000 mg | INTRAMUSCULAR | Status: DC | PRN

## 2012-09-19 MED ORDER — HYDRALAZINE HCL 20 MG/ML IJ SOLN: 10.0000 mg | INTRAMUSCULAR | Status: DC | PRN

## 2012-09-19 MED ORDER — ASPIRIN 81 MG PO CHEW
324.0000 mg | CHEWABLE_TABLET | Freq: Once | ORAL | Status: AC
  Administered 2012-09-19: 324 mg via ORAL
  Filled 2012-09-19: qty 4

## 2012-09-19 MED ORDER — GI COCKTAIL ~~LOC~~
30.0000 mL | Freq: Three times a day (TID) | ORAL | Status: DC | PRN
  Administered 2012-09-19: 30 mL via ORAL
  Filled 2012-09-19: qty 30

## 2012-09-19 MED ORDER — SODIUM CHLORIDE 0.9 % IV BOLUS (SEPSIS): 500.0000 mL | Freq: Once | INTRAVENOUS | Status: DC

## 2012-09-19 MED ORDER — METOPROLOL SUCCINATE ER 25 MG PO TB24
25.0000 mg | ORAL_TABLET | Freq: Every day | ORAL | Status: DC
  Administered 2012-09-19 – 2012-09-20 (×2): 25 mg via ORAL
  Filled 2012-09-19 (×2): qty 1

## 2012-09-19 MED ORDER — QUETIAPINE FUMARATE ER 400 MG PO TB24
400.0000 mg | ORAL_TABLET | Freq: Every day | ORAL | Status: DC
Start: 2012-09-20 — End: 2012-09-20
  Administered 2012-09-20: 400 mg via ORAL
  Filled 2012-09-19 (×2): qty 1

## 2012-09-19 MED ORDER — METOPROLOL TARTRATE 1 MG/ML IV SOLN
5.0000 mg | Freq: Once | INTRAVENOUS | Status: AC
  Administered 2012-09-19: 5 mg via INTRAVENOUS
  Filled 2012-09-19: qty 5

## 2012-09-19 MED ORDER — ASPIRIN EC 325 MG PO TBEC
325.0000 mg | DELAYED_RELEASE_TABLET | Freq: Every morning | ORAL | Status: DC
  Administered 2012-09-20: 325 mg via ORAL
  Filled 2012-09-19: qty 1

## 2012-09-19 MED ORDER — ONDANSETRON HCL 4 MG/2ML IJ SOLN: 4.0000 mg | Freq: Three times a day (TID) | INTRAMUSCULAR | Status: AC | PRN

## 2012-09-19 NOTE — H&P (Signed)
[facility] History and Physical  [name] WUJ:[id number] DOB: 1950/05/07 DOA: 09/19/2012  Referring physician: Dr Beverely Pace PCP: Provider Not In System  Specialists: Cardiology: Dr Peter [place]  Chief Complaint: chest pain  HPI: [name] is a 62 y.o. male  With past medical history of coronary artery disease status post PCI to RCA in 2007, patient is status post lytic CT scan Myoview done in December of 2013 with normal EF and no signs of ischemia, hypertension, anxiety, hyperlipidemia, prior history of tobacco use, bipolar disorder, history of esophagitis, depression who presents to the ED with a one-day history of midsternal chest pain described as a brick oral weight sitting on his chest occurring at rest while watching television. Patient endorses some palpitations, some shortness of breath, some diaphoresis, some nausea, some dry heaves and vomiting. Patient denies any radiation of the chest pain. Patient stated that the chest pain got progressively worse and subsequently presented to the ED. Patient denies any fever, no chills, no cough, no weakness, no constipation. Patient does and does 1 day of diarrhea which has since resolved. Patient also endorses decreased oral intake. Patient was seen in emergency room given some nitroglycerin or morphine as well as some Ativan with improvement in his chest pain. EKG which was done showed a sinus tachycardia. Chest x-ray which was done was negative for any acute infiltrate. D-dimer done was negative. Basic metabolic profile done and a sodium of 121 chloride of 85 bicarbonate of 15 BUN of 5 and a creatinine of 0.67. Personal troponin was negative. CBC had a hemoglobin of 12.9 otherwise was unremarkable. We were consulted to admit the patient for further evaluation and management.  Review of Systems: The patient denies anorexia, fever, weight loss,, vision loss, decreased hearing, hoarseness, chest pain, syncope, dyspnea on exertion, peripheral  edema, balance deficits, hemoptysis, abdominal pain, melena, hematochezia, severe indigestion/heartburn, hematuria, incontinence, genital sores, muscle weakness, suspicious skin lesions, transient blindness, difficulty walking, depression, unusual weight change, abnormal bleeding, enlarged lymph nodes, angioedema, and breast masses.   Past Medical History  Diagnosis Date  . MVP (mitral valve prolapse)   . Anxiety   . Hypertension   . Coronary artery disease     s/p PCI to RCA in 2007  . Hyperlipidemia   . Palpitations   . Bipolar disorder   . Depression   . Esophagitis   . Gastritis   . Insomnia   . Seizures   . Pneumonia 2005  . Arthritis   . Hypercholesteremia    Past Surgical History  Procedure Laterality Date  . Coronary angioplasty  2007    RCA  . Cardiac catheterization  04/2008    EF 65%  . Joint replacement    . Knee arthroscopy, medial patello femoral ligament repair    . Hip arthroplasty  bilateral   . Shoulder arthroscopy  right  . Tonsillectomy    . Total hip revision  02/07/2012    Procedure: TOTAL HIP REVISION;  Surgeon: [name], MD;  Location: [facility] OR;  Service: Orthopedics;  Laterality: Left;  [place] Hip Revision Set   Social History:  reports that he has been smoking.  He does not have any smokeless tobacco history on file. He reports that  drinks alcohol. He reports that he does not use illicit drugs.  Allergies  Allergen Reactions  . Ambien [Zolpidem Tartrate] Other (See Comments)    Sleep Walking  . Codeine Itching    Family History  Problem Relation Age of  Onset  . Heart disease Neg Hx   . Prostate cancer Father     Prior to Admission medications   Medication Sig Start Date End Date Taking? Authorizing Provider  aspirin EC 325 MG tablet Take 1 tablet (325 mg total) by mouth every morning. For blood thinner/heart heart 03/29/12  Yes Sanjuana Kava, NP  atorvastatin (LIPITOR) 40 MG tablet Take 1 tablet (40 mg total) by mouth daily. For  cholesterol 03/29/12  Yes Sanjuana Kava, NP  buPROPion (WELLBUTRIN XL) 300 MG 24 hr tablet Take 1 tablet (300 mg total) by mouth daily. For depression 03/29/12  Yes Sanjuana Kava, NP  gabapentin (NEURONTIN) 100 MG capsule Take 2 capsules (200 mg total) by mouth 3 (three) times daily. For anxiety 03/29/12  Yes Sanjuana Kava, NP  glucosamine-chondroitin 500-400 MG tablet Take 3 tablets by mouth as needed (joint pain).   Yes Historical Provider, MD  hydrOXYzine (ATARAX/VISTARIL) 25 MG tablet Take 1 tablet (25 mg total) by mouth every 4 (four) hours as needed for anxiety. 03/29/12  Yes Sanjuana Kava, NP  LORazepam (ATIVAN) 1 MG tablet Take 1 mg by mouth every 6 (six) hours as needed for anxiety.   Yes Historical Provider, MD  metoprolol succinate (TOPROL-XL) 25 MG 24 hr tablet Take 1 tablet (25 mg total) by mouth daily. For blood pressure 03/29/12  Yes Sanjuana Kava, NP  QUEtiapine (SEROQUEL XR) 400 MG 24 hr tablet Take 400 mg by mouth at bedtime.   Yes Historical Provider, MD   Physical Exam: Filed Vitals:   09/19/12 1811  BP: 109/82  Pulse:   Temp:   Resp: 20     General:  Well-developed well-nourished in no acute cardiopulmonary distress.  Eyes: Pupils equal round and reactive to light and accommodation. Extraocular movements intact.  ENT: Oropharynx is clear, no lesions, no exudates.   Neck: Neck is supple no lymphadenopathy. No JVD.  Cardiovascular: Tachycardic regular rhythm no murmurs rubs or gallops.  Respiratory: Clear to auscultation bilaterally no wheezes no crackles no rhonchi.  Abdomen: Soft, nontender, nondistended, positive bowel sounds.  Skin: No rashes or lesions.  Musculoskeletal: 4/5 BUE strength, 4/5 BLE strength.  Psychiatric: Normal mood. Normal affect. Fair insight. Fair judgment.  Neurologic: Alert and oriented x3. Cranial nerves II through XII are grossly intact. No focal deficits.  Labs on Admission:  Basic Metabolic Panel:  Recent Labs Lab 09/19/12 1517  NA  121*  K 4.0  CL 85*  CO2 15*  GLUCOSE 67*  BUN 5*  CREATININE 0.67  CALCIUM 9.3   Liver Function Tests: No results found for this basename: AST, ALT, ALKPHOS, BILITOT, PROT, ALBUMIN,  in the last 168 hours No results found for this basename: LIPASE, AMYLASE,  in the last 168 hours No results found for this basename: AMMONIA,  in the last 168 hours CBC:  Recent Labs Lab 09/19/12 1517  WBC 6.6  HGB 12.9*  HCT 38.8*  MCV 74.2*  PLT 285   Cardiac Enzymes:  Recent Labs Lab 09/19/12 1533  TROPONINI <0.30    BNP (last 3 results)  Recent Labs  03/19/12 2223  PROBNP 58.3   CBG: No results found for this basename: GLUCAP,  in the last 168 hours  Radiological Exams on Admission: Dg Chest 2 View  09/19/2012   *RADIOLOGY REPORT*  Clinical Data: Chest pain.  CHEST - 2 VIEW  Comparison: Chest x-ray 03/20/2012.  Findings: Lung volumes are low.  No consolidative airspace disease. No pleural  effusions.  No evidence of pulmonary edema.  Heart size is borderline enlarged. The patient is rotated to the right on today's exam, resulting in distortion of the mediastinal contours and reduced diagnostic sensitivity and specificity for mediastinal pathology.  Old healed fracture of the posterolateral aspect of the right eighth rib is again noted.  Status post right shoulder hemiarthroplasty.  IMPRESSION: 1.  No radiographic evidence of acute cardiopulmonary disease.  The appearance of chest is similar to the prior study, as above.   Original Report Authenticated By: Trudie Reed, M.D.    EKG: Independently reviewed. Sinus tachycardia  Assessment/Plan Principal Problem:   Chest pain Active Problems:   CAD (coronary artery disease)   HTN (hypertension)   Hyperlipidemia   Anemia   Sinus tachycardia   Anxiety   Bipolar disorder   Depression   Hyponatremia  #1 chest pain Patient is presenting with midsternal chest pain described as a brick sitting on his chest. Patient does have a  prior history of coronary artery disease status post PCI to the RCA in 2007, hyperlipidemia, hypertension, tobacco abuse. Will admit the patient to telemetry. Mostly, oxygen, nitroglycerin, aspirin. Due to patient's cardiac history we'll consult with cardiology no further evaluation and management.  #2 hyponatremia Questionable etiology. May be secondary to hypovolemic hyponatremia in a patient with decreased oral intake over the past 24 hours as well as loose runny stools. Will check a urine sodium. Check a urine creatinine. Check a urine osmolality. Check a serum osmolality. Check a TSH. Chest x-ray is negative. Will hydrate with IV fluids and follow. If no significant improvement or worsening may consider discontinuing patient's Seroquel and Wellbutrin. Follow for now.  #3 hypertension Patient did not take some of his medications today. We'll resume patient back on his metoprolol. If further blood pressure control is seen and may consider Norvasc or ACE inhibitor. Hydralazine when necessary.  #4 bipolar disorder/depression Continue Seroquel and Wellbutrin. Follow.  #5 anxiety disorder Continue Neurontin and hydroxyzine as previously taken.  #6 prophylaxis PPI for GI prophylaxis. Lovenox for DVT prophylaxis.   Code Status: Full Family Communication: updated patient no family at bedside. Disposition Plan: Admit to telemetry  Time spent: 47 MINS  [facility] Pager [phone]  If 7PM-7AM, please contact night-coverage [email or web address] Password [facility] 09/19/2012, 6:19 PM

## 2012-09-19 NOTE — Consult Note (Signed)
HPI: 62 year old male for evaluation of chest pain. Patient has had previous PCI of his right coronary in 2007. He also has a history of mitral valve prolapse. Last nuclear study in December of 2013 showed an ejection fraction of 71% and normal perfusion. He typically does not have dyspnea on exertion, orthopnea, PND, pedal edema, palpitations, syncope or exertional chest pain. He states he had a close friend die approximately 4 weeks ago. He has felt stressed since that time. He developed substernal chest pain at approximately 2 PM yesterday. The pain was described as a brick sitting on his chest. It did not radiate. There was a pleuritic component. It was not exertional. There was nausea and dyspnea but no diaphoresis. The pain has been continuous for approximately 28 hours. It resolved in the emergency room with morphine and Ativan. Presently pain-free.  Medications Prior to Admission  Medication Sig Dispense Refill  . aspirin EC 325 MG tablet Take 1 tablet (325 mg total) by mouth every morning. For blood thinner/heart heart  30 tablet  0  . atorvastatin (LIPITOR) 40 MG tablet Take 1 tablet (40 mg total) by mouth daily. For cholesterol  30 tablet  0  . buPROPion (WELLBUTRIN XL) 300 MG 24 hr tablet Take 1 tablet (300 mg total) by mouth daily. For depression  30 tablet  0  . gabapentin (NEURONTIN) 100 MG capsule Take 2 capsules (200 mg total) by mouth 3 (three) times daily. For anxiety  180 capsule  0  . glucosamine-chondroitin 500-400 MG tablet Take 3 tablets by mouth as needed (joint pain).      . hydrOXYzine (ATARAX/VISTARIL) 25 MG tablet Take 1 tablet (25 mg total) by mouth every 4 (four) hours as needed for anxiety.  90 tablet  0  . LORazepam (ATIVAN) 1 MG tablet Take 1 mg by mouth every 6 (six) hours as needed for anxiety.      . metoprolol succinate (TOPROL-XL) 25 MG 24 hr tablet Take 1 tablet (25 mg total) by mouth daily. For blood pressure  30 tablet  0  . QUEtiapine (SEROQUEL XR) 400 MG 24  hr tablet Take 400 mg by mouth at bedtime.        Allergies  Allergen Reactions  . Ambien [Zolpidem Tartrate] Other (See Comments)    Sleep Walking  . Codeine Itching    Past Medical History  Diagnosis Date  . MVP (mitral valve prolapse)   . Anxiety   . Hypertension   . Coronary artery disease     s/p PCI to RCA in 2007  . Hyperlipidemia   . Palpitations   . Bipolar disorder   . Depression   . Esophagitis   . Gastritis   . Insomnia   . Seizures   . Pneumonia 2005  . Arthritis   . Hypercholesteremia     Past Surgical History  Procedure Laterality Date  . Coronary angioplasty  2007    RCA  . Cardiac catheterization  04/2008    EF 65%  . Knee arthroscopy, medial patello femoral ligament repair    . Hip arthroplasty  bilateral   . Shoulder arthroscopy  right  . Tonsillectomy    . Total hip revision  02/07/2012    Procedure: TOTAL HIP REVISION;  Surgeon: [name], MD;  Location: [facility] OR;  Service: Orthopedics;  Laterality: Left;  [place] Hip Revision Set  . Right knee surgery      History   Social History  . Marital Status: Single  Spouse Name: N/A    Number of Children: N/A  . Years of Education: N/A   Occupational History  . Not on file.   Social History Main Topics  . Smoking status: Current Some Day Smoker    Last Attempt to Quit: 10/22/2011  . Smokeless tobacco: Not on file  . Alcohol Use: 0.0 oz/week    12-15 Cans of beer per week  . Drug Use: No  . Sexual Activity: Yes   Other Topics Concern  . Not on file   Social History Narrative  . No narrative on file    Family History  Problem Relation Age of Onset  . Heart disease Neg Hx   . Prostate cancer Father     ROS:  no fevers or chills, productive cough, hemoptysis, dysphasia, odynophagia, melena, hematochezia, dysuria, hematuria, rash, seizure activity, orthopnea, PND, pedal edema, claudication. Remaining systems are negative.  Physical Exam:   Blood pressure 166/108, pulse 101,  temperature 98.6 F (37 C), temperature source Oral, resp. rate 20, height 5\' 10"  (1.778 m), weight 191 lb 5.8 oz (86.8 kg), SpO2 99.00%.  General:  Well developed/well nourished in NAD Skin warm/dry Patient not depressed No peripheral clubbing Back-normal HEENT-normal/normal eyelids Neck supple/normal carotid upstroke bilaterally; no bruits; no JVD; no thyromegaly chest - CTA/ normal expansion CV - RRR/normal S1 and S2; no murmurs, rubs or gallops;  PMI nondisplaced Abdomen -NT/ND, no HSM, no mass, + bowel sounds, no bruit 2+ femoral pulses, no bruits Ext-no edema, chords, 2+ DP Neuro-grossly nonfocal  ECG sinus tachycardia, right axis deviation, cannot rule out prior inferior infarct.  Results for orders placed during the hospital encounter of 09/19/12 (from the past 48 hour(s))  CBC     Status: Abnormal   Collection Time    09/19/12  3:17 PM      Result Value Range   WBC 6.6  4.0 - 10.5 K/uL   RBC 5.23  4.22 - 5.81 MIL/uL   Hemoglobin 12.9 (*) 13.0 - 17.0 g/dL   HCT 40.9 (*) 81.1 - 91.4 %   MCV 74.2 (*) 78.0 - 100.0 fL   MCH 24.7 (*) 26.0 - 34.0 pg   MCHC 33.2  30.0 - 36.0 g/dL   RDW 78.2 (*) 95.6 - 21.3 %   Platelets 285  150 - 400 K/uL  BASIC METABOLIC PANEL     Status: Abnormal   Collection Time    09/19/12  3:17 PM      Result Value Range   Sodium 121 (*) 135 - 145 mEq/L   Potassium 4.0  3.5 - 5.1 mEq/L   Chloride 85 (*) 96 - 112 mEq/L   CO2 15 (*) 19 - 32 mEq/L   Glucose, Bld 67 (*) 70 - 99 mg/dL   BUN 5 (*) 6 - 23 mg/dL   Creatinine, Ser 0.86  0.50 - 1.35 mg/dL   Calcium 9.3  8.4 - 57.8 mg/dL   GFR calc non Af Amer >90  >90 mL/min   GFR calc Af Amer >90  >90 mL/min   Comment: (NOTE)     The eGFR has been calculated using the CKD EPI equation.     This calculation has not been validated in all clinical situations.     eGFR's persistently <90 mL/min signify possible Chronic Kidney     Disease.  POCT I-STAT TROPONIN I     Status: None   Collection Time     09/19/12  3:26 PM  Result Value Range   Troponin i, poc 0.03  0.00 - 0.08 ng/mL   Comment 3            Comment: Due to the release kinetics of cTnI,     a negative result within the first hours     of the onset of symptoms does not rule out     myocardial infarction with certainty.     If myocardial infarction is still suspected,     repeat the test at appropriate intervals.  D-DIMER, QUANTITATIVE     Status: None   Collection Time    09/19/12  3:30 PM      Result Value Range   D-Dimer, Quant 0.37  0.00 - 0.48 ug/mL-FEU   Comment:            AT THE INHOUSE ESTABLISHED CUTOFF     VALUE OF 0.48 ug/mL FEU,     THIS ASSAY HAS BEEN DOCUMENTED     IN THE LITERATURE TO HAVE     A SENSITIVITY AND NEGATIVE     PREDICTIVE VALUE OF AT LEAST     98 TO 99%.  THE TEST RESULT     SHOULD BE CORRELATED WITH     AN ASSESSMENT OF THE CLINICAL     PROBABILITY OF DVT / VTE.  TROPONIN I     Status: None   Collection Time    09/19/12  3:33 PM      Result Value Range   Troponin I <0.30  <0.30 ng/mL   Comment:            Due to the release kinetics of cTnI,     a negative result within the first hours     of the onset of symptoms does not rule out     myocardial infarction with certainty.     If myocardial infarction is still suspected,     repeat the test at appropriate intervals.  CBC WITH DIFFERENTIAL     Status: Abnormal   Collection Time    09/19/12  5:12 PM      Result Value Range   WBC 6.3  4.0 - 10.5 K/uL   RBC 5.06  4.22 - 5.81 MIL/uL   Hemoglobin 12.9 (*) 13.0 - 17.0 g/dL   HCT 47.8 (*) 29.5 - 62.1 %   MCV 74.1 (*) 78.0 - 100.0 fL   MCH 25.5 (*) 26.0 - 34.0 pg   MCHC 34.4  30.0 - 36.0 g/dL   RDW 30.8 (*) 65.7 - 84.6 %   Platelets 227  150 - 400 K/uL   Neutrophils Relative % 43  43 - 77 %   Neutro Abs 2.7  1.7 - 7.7 K/uL   Lymphocytes Relative 40  12 - 46 %   Lymphs Abs 2.5  0.7 - 4.0 K/uL   Monocytes Relative 11  3 - 12 %   Monocytes Absolute 0.7  0.1 - 1.0 K/uL    Eosinophils Relative 7 (*) 0 - 5 %   Eosinophils Absolute 0.4  0.0 - 0.7 K/uL   Basophils Relative 1  0 - 1 %   Basophils Absolute 0.0  0.0 - 0.1 K/uL  CG4 I-STAT (LACTIC ACID)     Status: Abnormal   Collection Time    09/19/12  6:28 PM      Result Value Range   Lactic Acid, Venous 2.35 (*) 0.5 - 2.2 mmol/L    Dg Chest 2 View  09/19/2012   *  RADIOLOGY REPORT*  Clinical Data: Chest pain.  CHEST - 2 VIEW  Comparison: Chest x-ray 03/20/2012.  Findings: Lung volumes are low.  No consolidative airspace disease. No pleural effusions.  No evidence of pulmonary edema.  Heart size is borderline enlarged. The patient is rotated to the right on today's exam, resulting in distortion of the mediastinal contours and reduced diagnostic sensitivity and specificity for mediastinal pathology.  Old healed fracture of the posterolateral aspect of the right eighth rib is again noted.  Status post right shoulder hemiarthroplasty.  IMPRESSION: 1.  No radiographic evidence of acute cardiopulmonary disease.  The appearance of chest is similar to the prior study, as above.   Original Report Authenticated By: Trudie Reed, M.D.    Assessment/Plan 1 chest pain-symptoms are not consistent with cardiac pain. They have been continuous for 28 hours and his initial enzymes are negative. Chest pain improved with morphine and Ativan. There was a pleuritic component and his electrocardiogram shows sinus tachycardia, right axis deviation. This raises concern for pulmonary embolus. However his d-dimer is negative. There also appears to be an anxiety component based on recent death of friend. If followup enzymes negative, patient can be discharged tomorrow with outpatient functional study. 2 hyponatremia-etiology unclear. Workup has been initiated by primary care. 3 microcytic anemia-patient will most likely need GI evaluation at some point. I will leave this to primary care. 4 coronary artery disease-continue aspirin and statin. 5  hyperlipidemia-continue statin. 6 anxiety-management per primary care. 7 tobacco abuse-counseled on discontinuing.  Olga Millers MD 09/19/2012, 6:58 PM

## 2012-09-19 NOTE — ED Notes (Signed)
Pt reports CP since last night, intermittent since onset. Nothing relieves or increases the pain but when the pain comes it makes him feel really anxious. Also reports sob and nausea.

## 2012-09-19 NOTE — Progress Notes (Signed)
Report given to night nurse, patient is stable.  D. Zhamir Pirro RN 

## 2012-09-19 NOTE — ED Provider Notes (Signed)
CSN: [id number]     Arrival date & time 09/19/12  1354 History   First MD Initiated Contact with Patient 09/19/12 1452     Chief Complaint  Patient presents with  . Chest Pain   (Consider location/radiation/quality/duration/timing/severity/associated sxs/prior Treatment) Patient is a 62 y.o. male presenting with chest pain. The history is provided by the patient.  Chest Pain Pain location:  Substernal area Pain quality: pressure   Pain radiates to:  Does not radiate Pain radiates to the back: no   Pain severity:  Moderate Onset quality:  Sudden Duration: since last night. Timing:  Constant Progression:  Waxing and waning Chronicity:  New Context: stress ("My friend died from ovarian cancer 4 weeks ago.")   Ineffective treatments:  None tried (did drink 2 beers at lunch today) Associated symptoms: anxiety ("I've been out of Ativan for the past 2 months."), cough (mild), diaphoresis ("off and on"), nausea and shortness of breath   Associated symptoms: no abdominal pain, no dizziness, no fever, no heartburn, no palpitations and not vomiting ("I've been retching")   Risk factors: coronary artery disease, high cholesterol, hypertension and smoking     Past Medical History  Diagnosis Date  . MVP (mitral valve prolapse)   . Anxiety   . Hypertension   . Coronary artery disease     s/p PCI to RCA in 2007  . Hyperlipidemia   . Palpitations   . Bipolar disorder   . Depression   . Esophagitis   . Gastritis   . Insomnia   . Seizures   . Pneumonia 2005  . Arthritis   . Hypercholesteremia    Past Surgical History  Procedure Laterality Date  . Coronary angioplasty  2007    RCA  . Cardiac catheterization  04/2008    EF 65%  . Joint replacement    . Knee arthroscopy, medial patello femoral ligament repair    . Hip arthroplasty  bilateral   . Shoulder arthroscopy  right  . Tonsillectomy    . Total hip revision  02/07/2012    Procedure: TOTAL HIP REVISION;  Surgeon: [name],  MD;  Location: [facility] OR;  Service: Orthopedics;  Laterality: Left;  [place] Hip Revision Set   Family History  Problem Relation Age of Onset  . Heart disease Neg Hx   . Prostate cancer Father    History  Substance Use Topics  . Smoking status: Current Some Day Smoker    Last Attempt to Quit: 10/22/2011  . Smokeless tobacco: Not on file  . Alcohol Use: 0.0 oz/week    12-15 Cans of beer per week    Review of Systems  Constitutional: Positive for diaphoresis ("off and on"). Negative for fever.  Respiratory: Positive for cough (mild) and shortness of breath.   Cardiovascular: Positive for chest pain. Negative for palpitations.  Gastrointestinal: Positive for nausea. Negative for heartburn, vomiting ("I've been retching") and abdominal pain.  Neurological: Negative for dizziness.    Allergies  Ambien and Codeine  Home Medications   Current Outpatient Rx  Name  Route  Sig  Dispense  Refill  . aspirin EC 325 MG tablet   Oral   Take 1 tablet (325 mg total) by mouth every morning. For blood thinner/heart heart   30 tablet   0   . atorvastatin (LIPITOR) 40 MG tablet   Oral   Take 1 tablet (40 mg total) by mouth daily. For cholesterol   30 tablet   0   . buPROPion [place]  XL) 300 MG 24 hr tablet   Oral   Take 1 tablet (300 mg total) by mouth daily. For depression   30 tablet   0   . gabapentin (NEURONTIN) 100 MG capsule   Oral   Take 2 capsules (200 mg total) by mouth 3 (three) times daily. For anxiety   180 capsule   0   . hydrOXYzine (ATARAX/VISTARIL) 25 MG tablet   Oral   Take 1 tablet (25 mg total) by mouth every 4 (four) hours as needed for anxiety.   90 tablet   0   . metoprolol succinate (TOPROL-XL) 25 MG 24 hr tablet   Oral   Take 1 tablet (25 mg total) by mouth daily. For blood pressure   30 tablet   0   . QUEtiapine (SEROQUEL XR) 50 MG TB24      Take 1 tablet (50mg )  every morning and 3 tablets (150mg ) at bedtime for thoughts/mood   120 each    0    BP 156/107  Pulse 117  Temp(Src) 98.5 F (36.9 C) (Oral)  Resp 18  Ht 5\' 10"  (1.778 m)  Wt 185 lb (83.915 kg)  BMI 26.54 kg/m2  SpO2 96% Physical Exam  Vitals reviewed. Constitutional: He is oriented to person, place, and time. He appears well-developed and well-nourished. No distress.  HENT:  Head: Normocephalic.  Right Ear: External ear normal.  Left Ear: External ear normal.  Nose: Nose normal.  Mouth/Throat: Oropharynx is clear and moist. No oropharyngeal exudate.  Eyes: Conjunctivae and EOM are normal.  Neck: Normal range of motion. Neck supple.  Cardiovascular: Regular rhythm, normal heart sounds and intact distal pulses.  Exam reveals no gallop and no friction rub.   No murmur heard. tachycardic  Pulmonary/Chest: Effort normal and breath sounds normal. No respiratory distress.  Crackles in bases  Abdominal: Soft. Bowel sounds are normal. He exhibits no distension. There is no tenderness.  Musculoskeletal: Normal range of motion. He exhibits no edema and no tenderness.  Neurological: He is alert and oriented to person, place, and time. No cranial nerve deficit.  Skin: Skin is warm and dry.  Psychiatric: He has a normal mood and affect.    ED Course  Procedures (including critical care time) Labs Review Labs Reviewed  CBC - Abnormal; Notable for the following:    Hemoglobin 12.9 (*)    HCT 38.8 (*)    MCV 74.2 (*)    MCH 24.7 (*)    RDW 15.7 (*)    All other components within normal limits  BASIC METABOLIC PANEL - Abnormal; Notable for the following:    Sodium 121 (*)    Chloride 85 (*)    CO2 15 (*)    Glucose, Bld 67 (*)    BUN 5 (*)    All other components within normal limits  TROPONIN I  D-DIMER, QUANTITATIVE  POCT I-STAT TROPONIN I   Imaging Review Dg Chest 2 View  09/19/2012   *RADIOLOGY REPORT*  Clinical Data: Chest pain.  CHEST - 2 VIEW  Comparison: Chest x-ray 03/20/2012.  Findings: Lung volumes are low.  No consolidative airspace  disease. No pleural effusions.  No evidence of pulmonary edema.  Heart size is borderline enlarged. The patient is rotated to the right on today's exam, resulting in distortion of the mediastinal contours and reduced diagnostic sensitivity and specificity for mediastinal pathology.  Old healed fracture of the posterolateral aspect of the right eighth rib is again noted.  Status post  right shoulder hemiarthroplasty.  IMPRESSION: 1.  No radiographic evidence of acute cardiopulmonary disease.  The appearance of chest is similar to the prior study, as above.   Original Report Authenticated By: Trudie Reed, M.D.    MDM   15 y M with PMH of CAD s/p RCA PCI in 2007 (takes 325 mg ASA most days), bipolar, depression, current smoker, some days drinker (2 beers today), here with CP that started last night while on the internet, no strenuous activity, described as pressure, waxes and wanes, 10/10 at times, +diaphoresis, +nausea.  Tachycardic, afebrile, hypertensive.  NAD.  Crackles in lung bases.  No edema.    Diff Dx: ACS, PE, PNA, anxiety.  Story is concerning for ACS.  CBC, BMP, trop, d-dimer, CXR.   Date: 09/19/2012  Rate: 115  Rhythm: sinus tachycardia  QRS Axis: right  Intervals: normal and QTc 462  ST/T Wave abnormalities: normal  Conduction Disutrbances:none  Narrative Interpretation: Sinus tach, inferior infarct noted previously  Old EKG Reviewed: changes noted and rate increased to 115 from 103 from EKG 03/20/12  4:56 PM Medicine consulted for admission for hyponatremia.  NS bolus given.  Pain has essentially resolved.  Will give 5 mg IV Metoprolol for HTN and give his home dose of PO metoprolol.  Clinical Impression: 1. Hyponatremia   2. Chest pain     Disposition: Admit  Condition: Fair   I have discussed the results, Dx and Tx plan. They understand and agree with plan for admission.  Exam unchanged at admission.   Pt seen in conjunction with Dr. Rhunette Croft.  Reine Just. Beverely Pace,  MD Emergency Medicine PGY-III [phone]   Oleh Genin, MD 09/20/12 [phone]

## 2012-09-20 DIAGNOSIS — I2510 Atherosclerotic heart disease of native coronary artery without angina pectoris: Secondary | ICD-10-CM

## 2012-09-20 DIAGNOSIS — D649 Anemia, unspecified: Secondary | ICD-10-CM

## 2012-09-20 LAB — BASIC METABOLIC PANEL
Chloride: 100 mEq/L (ref 96–112)
GFR calc Af Amer: >90 mL/min (ref >90)
GFR calc non Af Amer: >90 mL/min (ref >90)
Glucose, Bld: 86 mg/dL (ref 70–99)
Potassium: 4.1 mEq/L (ref 3.5–5.1)
Sodium: 132 mEq/L — ABNORMAL LOW (ref 135–145)

## 2012-09-20 LAB — CBC
HCT: 36.6 % — ABNORMAL LOW (ref 39.0–52.0)
Hemoglobin: 12 g/dL — ABNORMAL LOW (ref 13.0–17.0)
MCHC: 32.8 g/dL (ref 30.0–36.0)
RBC: 4.82 MIL/uL (ref 4.22–5.81)
WBC: 4.2 10*3/uL (ref 4.0–10.5)

## 2012-09-20 LAB — RAPID URINE DRUG SCREEN, HOSP PERFORMED
Barbiturates: NOT DETECTED
Tetrahydrocannabinol: NOT DETECTED

## 2012-09-20 LAB — LIPID PANEL
Cholesterol: 172 mg/dL (ref 0–200)
HDL: 50 mg/dL (ref >39)
Triglycerides: 122 mg/dL (ref <150)

## 2012-09-20 LAB — URINALYSIS, ROUTINE W REFLEX MICROSCOPIC
Bilirubin Urine: NEGATIVE
Hgb urine dipstick: NEGATIVE
Specific Gravity, Urine: 1.008 (ref 1.005–1.030)
pH: 6.5 (ref 5.0–8.0)

## 2012-09-20 LAB — OSMOLALITY, URINE: Osmolality, Ur: 246 mOsm/kg — ABNORMAL LOW (ref 390–1090)

## 2012-09-20 LAB — OSMOLALITY: Osmolality: 263 mOsm/kg — ABNORMAL LOW (ref 275–300)

## 2012-09-20 LAB — TROPONIN I: Troponin I: <0.3 ng/mL (ref <0.30)

## 2012-09-20 MED ORDER — NITROGLYCERIN 0.4 MG SL SUBL: 0.4000 mg | SUBLINGUAL_TABLET | SUBLINGUAL | Status: DC | PRN

## 2012-09-20 NOTE — Progress Notes (Signed)
F/u appt placed in epic dc section. Zharia Conrow PA-C

## 2012-09-20 NOTE — Progress Notes (Signed)
Utilization review completed. Quita Mcgrory, RN, BSN. 

## 2012-09-20 NOTE — Progress Notes (Signed)
Went over discharge instructions with the patient. Patient had no additional questions or concerns related to discharge. IV was D/C'D. Patient stable. Patient discharged home with significant other.

## 2012-09-20 NOTE — Progress Notes (Signed)
TELEMETRY: Reviewed telemetry pt in NSR: Filed Vitals:   09/19/12 1840 09/19/12 2104 09/20/12 0100 09/20/12 0509  BP: 166/108 139/94 126/85 134/58  Pulse: 101 85 87 88  Temp: 98.6 F (37 C) 97.7 F (36.5 C) 97.7 F (36.5 C) 97.5 F (36.4 C)  TempSrc: Oral Oral Oral Oral  Resp: 20 18 16 16   Height: 5\' 10"  (1.778 m)     Weight: 191 lb 5.8 oz (86.8 kg)   192 lb 12.8 oz (87.454 kg)  SpO2: 99% 98% 96% 96%    Intake/Output Summary (Last 24 hours) at 09/20/12 1025 Last data filed at 09/20/12 0817  Gross per 24 hour  Intake 1566.25 ml  Output   2400 ml  Net -833.75 ml    SUBJECTIVE Feels much better. Now without chest pain.  LABS: Basic Metabolic Panel:  Recent Labs  21/30/86 1517 09/19/12 2047 09/20/12 0632  NA 121*  --  132*  K 4.0  --  4.1  CL 85*  --  100  CO2 15*  --  18*  GLUCOSE 67*  --  86  BUN 5*  --  6  CREATININE 0.67 0.77 0.77  CALCIUM 9.3  --  8.4  MG  --  2.2  --    Liver Function Tests:  Recent Labs  09/19/12 1712  AST 43*  ALT 26  ALKPHOS 78  BILITOT 0.5  PROT 7.0  ALBUMIN 4.1   No results found for this basename: LIPASE, AMYLASE,  in the last 72 hours CBC:  Recent Labs  09/19/12 1712 09/19/12 2047 09/20/12 0632  WBC 6.3 5.8 4.2  NEUTROABS 2.7  --   --   HGB 12.9* 12.2* 12.0*  HCT 37.5* 36.0* 36.6*  MCV 74.1* 74.5* 75.9*  PLT 227 243 221   Cardiac Enzymes:  Recent Labs  09/19/12 1533 09/19/12 2224 09/20/12 0430  TROPONINI <0.30 <0.30 <0.30   BNP: No components found with this basename: POCBNP,  D-Dimer:  Recent Labs  09/19/12 1530  DDIMER 0.37   Hemoglobin A1C: No results found for this basename: HGBA1C,  in the last 72 hours Fasting Lipid Panel:  Recent Labs  09/20/12 0632  CHOL 172  HDL 50  LDLCALC 98  TRIG 122  CHOLHDL 3.4   Thyroid Function Tests:  Recent Labs  09/19/12 1712  TSH 1.348   Anemia Panel: No results found for this basename: VITAMINB12, FOLATE, FERRITIN, TIBC, IRON, RETICCTPCT,   in the last 72 hours  Radiology/Studies:  Dg Chest 2 View  09/19/2012   *RADIOLOGY REPORT*  Clinical Data: Chest pain.  CHEST - 2 VIEW  Comparison: Chest x-ray 03/20/2012.  Findings: Lung volumes are low.  No consolidative airspace disease. No pleural effusions.  No evidence of pulmonary edema.  Heart size is borderline enlarged. The patient is rotated to the right on today's exam, resulting in distortion of the mediastinal contours and reduced diagnostic sensitivity and specificity for mediastinal pathology.  Old healed fracture of the posterolateral aspect of the right eighth rib is again noted.  Status post right shoulder hemiarthroplasty.  IMPRESSION: 1.  No radiographic evidence of acute cardiopulmonary disease.  The appearance of chest is similar to the prior study, as above.   Original Report Authenticated By: Trudie Reed, M.D.    PHYSICAL EXAM General: Well developed, well nourished, in no acute distress. Head: Normocephalic, atraumatic, sclera non-icteric, no xanthomas, nares are without discharge. Neck: Negative for carotid bruits. JVD not elevated. Lungs: Clear bilaterally to auscultation without wheezes,  rales, or rhonchi. Breathing is unlabored. Heart: RRR S1 S2 without murmurs, rubs, or gallops.  Abdomen: Soft, non-tender, non-distended with normoactive bowel sounds. No hepatomegaly. No rebound/guarding. No obvious abdominal masses. Msk:  Strength and tone appears normal for age. Extremities: No clubbing, cyanosis or edema.  Distal pedal pulses are 2+ and equal bilaterally. Neuro: Alert and oriented X 3. Moves all extremities spontaneously. Psych:  Responds to questions appropriately with a normal affect.  ASSESSMENT AND PLAN: 1. Chest pain. I agree with Dr. Jens Som that this does not sound cardiac. Enzymes are normal. No Ecg changes. Myoview in 12/13 was normal. I would recommend DC on prior medications. We will arrange follow up in our office in 2-3 weeks. I don't think he  needs further cardiac work up unless he has ongoing pain. 2. Hyponatremia ? Spurious lab. Sodium increased from 121-132 in one day. 3. Anxiety 4. Dyslipidemia 5. Tobacco abuse. States he is only smoking socially. Recommend complete cessation.  Principal Problem:   Chest pain Active Problems:   CAD (coronary artery disease)   HTN (hypertension)   Hyperlipidemia   Anemia   Sinus tachycardia   Anxiety   Bipolar disorder   Depression   Hyponatremia    Signed, Peter [place] MD,FACC 09/20/2012 10:33 AM

## 2012-09-20 NOTE — Progress Notes (Signed)
Patient refused Troponin Lab draw, will continue to monitor patient. Lorretta Harp RN

## 2012-09-20 NOTE — Discharge Summary (Signed)
Physician Discharge Summary  [name] VWU:[id number] DOB: 03/24/1950 DOA: 09/19/2012  PCP: Provider Not In System  Admit date: 09/19/2012 Discharge date: 09/20/2012  Time spent: 60 minutes  Recommendations for Outpatient Follow-up:  1. Followup with cardiology, Tereso Newcomer, PA on 10/08/2012. 2. Followup with PCP 1 week post discharge. On follow up a basic metabolic profile will need to be obtained to follow up on electrolytes and renal function. If the patient has not had anemia workup done will likely benefit from one. If patient has not had a screening colonoscopy done will likely benefit from one as he has a microcytic anemia.  Discharge Diagnoses:  Principal Problem:   Chest pain Active Problems:   CAD (coronary artery disease)   HTN (hypertension)   Hyperlipidemia   Anemia   Sinus tachycardia   Anxiety   Bipolar disorder   Depression   Hyponatremia   Discharge Condition: Stable and improved  Diet recommendation: Heart healthy  Filed Weights   09/19/12 1359 09/19/12 1840 09/20/12 0509  Weight: 83.915 kg (185 lb) 86.8 kg (191 lb 5.8 oz) 87.454 kg (192 lb 12.8 oz)    History of present illness:  [name] is a 62 y.o. male  With past medical history of coronary artery disease status post PCI to RCA in 2007, patient is status post lytic CT scan Myoview done in December of 2013 with normal EF and no signs of ischemia, hypertension, anxiety, hyperlipidemia, prior history of tobacco use, bipolar disorder, history of esophagitis, depression who presents to the ED with a one-day history of midsternal chest pain described as a brick oral weight sitting on his chest occurring at rest while watching television. Patient endorses some palpitations, some shortness of breath, some diaphoresis, some nausea, some dry heaves and vomiting. Patient denies any radiation of the chest pain. Patient stated that the chest pain got progressively worse and subsequently presented to the ED. Patient  denies any fever, no chills, no cough, no weakness, no constipation. Patient does and does 1 day of diarrhea which has since resolved. Patient also endorses decreased oral intake.  Patient was seen in emergency room given some nitroglycerin or morphine as well as some Ativan with improvement in his chest pain. EKG which was done showed a sinus tachycardia. Chest x-ray which was done was negative for any acute infiltrate. D-dimer done was negative. Basic metabolic profile done and a sodium of 121 chloride of 85 bicarbonate of 15 BUN of 5 and a creatinine of 0.67. Personal troponin was negative. CBC had a hemoglobin of 12.9 otherwise was unremarkable.  We were consulted to admit the patient for further evaluation and management   Hospital Course:  #1. chest pain Patient had presented admitting chest pain which he described as bricks sitting on his chest. Due to patient's prior cardiac history of PCI to RCA 2007, this likely hypertension hyperlipidemia tobacco abuse was admitted to telemetry for chest pain rule out. Patient was placed on oxygen nitroglycerin morphine sulfate and aspirin. Patient had a prior Myoview stress test done in December 2013 which was negative. This was followed as this has been resolved with nitroglycerin and morphine in the ED. Cardiology consultation was obtained the patient was seen in consultation by Dr. Jens Som on 09/19/2012. Per cardiology that this is his pain was not consistent with cardiac pain as it had been continuous for 24 hours an initial set of enzymes were negative. EKG which showed a sinus tachycardia with right axis deviation however d-dimer which was  done was negative. Cardiac enzymes were cycled which were negative x3. It was felt cardiac workup was needed and presumably discharged home in stable and improved condition to followup with cardiology as outpatient.  #2 hyponatremia This was admitted with a hyponatremia with a sodium of 121. Patient however did state  he had some nausea and some emesis as well as a day of diarrhea. He was this may be likely secondary to dehydration. CXR was negative. Urine sodium came back at 14 urine creatinine was 53.45. Urine osmolality was 246. Patient was hydrated with IV fluids with significant improvement hyponatremia. In no distress patient's sodium was 132. Presented to followup with PCP as outpatient.  #3 microcytic anemia Patient was noted to have a slight anemia with a hemoglobin of 12 MCV was 75.9. Patient had no overt GI bleed. Additionally to followup with PCP as outpatient. If patient has not had a screening colonoscopy done by likely benefit from one.  #4 hypertension On admission patient was noted to be hypotensive the patient had not taking all of his blood pressure medications. Patient was resumed back on his home regimen with significant improvement in his hypotension. Additionally the sagittal in stable condition.  The rest of patient's chronic medical issues remained stable throughout the hospitalization the patient will be discharged in stable and improved condition.    Procedures:  None  Consultations:  Cardiology: Dr Jens Som 09/19/12  Discharge Exam: Filed Vitals:   09/20/12 0509  BP: 134/58  Pulse: 88  Temp: 97.5 F (36.4 C)  Resp: 16    General: NAD Cardiovascular: RRR Respiratory: CTAB  Discharge Instructions      Discharge Orders   Future Appointments Provider Department Dept Phone   10/08/2012 12:10 PM Beatrice Lecher, PA-C [facility] Main Office [place]) [phone]   Future Orders Complete By Expires   Diet - low sodium heart healthy  As directed    Discharge instructions  As directed    Comments:     Follow up with Dr Swaziland as scheduled.   Increase activity slowly  As directed        Medication List         aspirin EC 325 MG tablet  Take 1 tablet (325 mg total) by mouth every morning. For blood thinner/heart heart     atorvastatin 40 MG tablet   Commonly known as:  LIPITOR  Take 1 tablet (40 mg total) by mouth daily. For cholesterol     buPROPion 300 MG 24 hr tablet  Commonly known as:  WELLBUTRIN XL  Take 1 tablet (300 mg total) by mouth daily. For depression     gabapentin 100 MG capsule  Commonly known as:  NEURONTIN  Take 2 capsules (200 mg total) by mouth 3 (three) times daily. For anxiety     glucosamine-chondroitin 500-400 MG tablet  Take 3 tablets by mouth as needed (joint pain).     hydrOXYzine 25 MG tablet  Commonly known as:  ATARAX/VISTARIL  Take 1 tablet (25 mg total) by mouth every 4 (four) hours as needed for anxiety.     LORazepam 1 MG tablet  Commonly known as:  ATIVAN  Take 1 mg by mouth every 6 (six) hours as needed for anxiety.     metoprolol succinate 25 MG 24 hr tablet  Commonly known as:  TOPROL-XL  Take 1 tablet (25 mg total) by mouth daily. For blood pressure     nitroGLYCERIN 0.4 MG SL tablet  Commonly known as:  NITROSTAT  Place 1 tablet (0.4 mg total) under the tongue every 5 (five) minutes as needed for chest pain (CP or SOB).     QUEtiapine 400 MG 24 hr tablet  Commonly known as:  SEROQUEL XR  Take 400 mg by mouth at bedtime.       Allergies  Allergen Reactions  . Ambien [Zolpidem Tartrate] Other (See Comments)    Sleep Walking  . Codeine Itching   Follow-up Information   Follow up with Tereso Newcomer, PA-C. ([facility] - 10/08/12 at 12:05pm)    Specialty:  Physician Assistant   Contact information:   [address] [place] [address] [phone]       Schedule an appointment as soon as possible for a visit in 1 week to follow up. (f/u with PCP in 1-2 weeks)        The results of significant diagnostics from this hospitalization (including imaging, microbiology, ancillary and laboratory) are listed below for reference.    Significant Diagnostic Studies: Dg Chest 2 View  09/19/2012   *RADIOLOGY REPORT*  Clinical Data: Chest pain.  CHEST - 2  VIEW  Comparison: Chest x-ray 03/20/2012.  Findings: Lung volumes are low.  No consolidative airspace disease. No pleural effusions.  No evidence of pulmonary edema.  Heart size is borderline enlarged. The patient is rotated to the right on today's exam, resulting in distortion of the mediastinal contours and reduced diagnostic sensitivity and specificity for mediastinal pathology.  Old healed fracture of the posterolateral aspect of the right eighth rib is again noted.  Status post right shoulder hemiarthroplasty.  IMPRESSION: 1.  No radiographic evidence of acute cardiopulmonary disease.  The appearance of chest is similar to the prior study, as above.   Original Report Authenticated By: Trudie Reed, M.D.    Microbiology: No results found for this or any previous visit (from the past 240 hour(s)).   Labs: Basic Metabolic Panel:  Recent Labs Lab 09/19/12 1517 09/19/12 2047 09/20/12 0632  NA 121*  --  132*  K 4.0  --  4.1  CL 85*  --  100  CO2 15*  --  18*  GLUCOSE 67*  --  86  BUN 5*  --  6  CREATININE 0.67 0.77 0.77  CALCIUM 9.3  --  8.4  MG  --  2.2  --    Liver Function Tests:  Recent Labs Lab 09/19/12 1712  AST 43*  ALT 26  ALKPHOS 78  BILITOT 0.5  PROT 7.0  ALBUMIN 4.1   No results found for this basename: LIPASE, AMYLASE,  in the last 168 hours No results found for this basename: AMMONIA,  in the last 168 hours CBC:  Recent Labs Lab 09/19/12 1517 09/19/12 1712 09/19/12 2047 09/20/12 0632  WBC 6.6 6.3 5.8 4.2  NEUTROABS  --  2.7  --   --   HGB 12.9* 12.9* 12.2* 12.0*  HCT 38.8* 37.5* 36.0* 36.6*  MCV 74.2* 74.1* 74.5* 75.9*  PLT 285 227 243 221   Cardiac Enzymes:  Recent Labs Lab 09/19/12 1533 09/19/12 2224 09/20/12 0430  TROPONINI <0.30 <0.30 <0.30   BNP: BNP (last 3 results)  Recent Labs  03/19/12 2223  PROBNP 58.3   CBG: No results found for this basename: GLUCAP,  in the last 168 hours     Signed:  Jabbar Palmero  [facility] 09/20/2012, 2:03 PM

## 2012-09-20 NOTE — Progress Notes (Signed)
Cosign for [facility] assessment, I/O, med administration, care plan/education etc.  Lorretta Harp RN

## 2012-09-20 NOTE — Progress Notes (Signed)
List of PCPs given to pt to make appointment.

## 2012-09-20 NOTE — Progress Notes (Signed)
Pt refused to have ECHO done. Talked to pt about benefits and what diagnostics can be found with this test.  Text page to DR Janee Morn to make aware.

## 2012-09-21 LAB — URINE CULTURE
Colony Count: NO GROWTH
Culture: NO GROWTH

## 2012-09-24 NOTE — ED Provider Notes (Signed)
I performed a history and physical examination of  [name] and discussed his management with Dr. Beverely Pace. I agree with the history, physical, assessment, and plan of care, with the following exceptions: None I was present for the following procedures: None  Time Spent in Critical Care of the patient: None  Time spent in discussions with the patient and family: 10 minutes  Pt with hx of CAD comes in with chest pain, concerning for Cardiac in etiology. Exam benign. Admit for ACS workup.  Kendrea Cerritos   Derwood Kaplan, MD 09/24/12 [phone]

## 2012-10-08 ENCOUNTER — Encounter: Payer: Self-pay | Admitting: Physician Assistant

## 2012-10-08 ENCOUNTER — Ambulatory Visit (INDEPENDENT_AMBULATORY_CARE_PROVIDER_SITE_OTHER): Payer: Medicare Other | Admitting: Physician Assistant

## 2012-10-08 VITALS — BP 163/98 | HR 101 | Ht 70.0 in | Wt 198.0 lb

## 2012-10-08 DIAGNOSIS — F419 Anxiety disorder, unspecified: Secondary | ICD-10-CM

## 2012-10-08 DIAGNOSIS — I2510 Atherosclerotic heart disease of native coronary artery without angina pectoris: Secondary | ICD-10-CM

## 2012-10-08 DIAGNOSIS — E785 Hyperlipidemia, unspecified: Secondary | ICD-10-CM

## 2012-10-08 DIAGNOSIS — I10 Essential (primary) hypertension: Secondary | ICD-10-CM

## 2012-10-08 DIAGNOSIS — F411 Generalized anxiety disorder: Secondary | ICD-10-CM

## 2012-10-08 MED ORDER — METOPROLOL SUCCINATE ER 25 MG PO TB24: 50.0000 mg | ORAL_TABLET | Freq: Every day | ORAL | Status: DC

## 2012-10-08 NOTE — Patient Instructions (Addendum)
PLEASE FOLLOW UP WITH DR. Swaziland 01/02/13 @ 2:45  INCREASE TOPROL TO 50 MG DAILY; A REFILL HAS BEEN SENT IN TODAY FOR YOU TO [facility]

## 2012-10-08 NOTE — Progress Notes (Signed)
[address] [place], [place]  [address] Phone: [phone] Fax:  [phone]  Date:  10/08/2012   ID:  [name], DOB 08/07/1950, MRN [id number]  PCP:  Provider Not In System  Cardiologist:  Dr. Peter Swaziland    History of Present Illness: [name] is a 62 y.o. male who returns for follow up after recent admission to the hospital with chest pain.  He has a hx of CAD, s/p angioplasty of the RCA in 2007, MVP, HTN, HL. Last LHC 04/2008:  LM with irregularities, LAD 50%, mid LAD 50%, proximal D1 80-90%, proximal OM1 30-40%, proximal OM2 50%, PLB branch 30-40%, site of prior angioplasty in the RCA widely patent, EF 65%.  It was felt that the bulk of the disease was within small branches that are not suitable for intervention. Continued medical therapy was recommended at that time.  Echo 08/16/09: Mild LVH, EF 55-60%, no normal wall motion, grade 1 diastolic dysfunction.   Nuclear study a 12/2011: EF 71% and normal perfusion.    He was admitted 8/28-8/29 for chest pain. He was seen by Dr. Jens Som in consultation. His symptoms are not felt to be consistent with cardiac ischemia. Enzymes were negative. D-dimer was negative. Consideration was given towards pursuing outpatient nuclear study. However, the patient was seen in follow up by his primary cardiologist, Dr. Swaziland.  It was not felt that further outpatient workup was necessary.  Of note, he did have hyponatremia with a sodium of 121. Urine sodium was 14. Urine osmolality 246. He was hydrated with improvement in his sodium level. He was also noted to have microcytic anemia. Follow up with primary care recommended.  The patient denies chest pain, shortness of breath, syncope, orthopnea, PND or significant pedal edema. He works out at [employer] several times a week without significant problems.  Labs (8/14):   Na 121=>132, K 4.1, Cr 0.77, ALT 26, LDL 98, Hgb 12 (MCV 75.9),  TSH 1.348  Wt Readings from Last 3 Encounters:  10/08/12  198 lb (89.812 kg)  09/20/12 192 lb 12.8 oz (87.454 kg)  03/22/12 191 lb 8 oz (86.864 kg)     Past Medical History  Diagnosis Date  . MVP (mitral valve prolapse)   . Anxiety   . Hypertension   . Hyperlipidemia   . Palpitations   . Bipolar disorder   . Depression   . Esophagitis   . Gastritis   . Insomnia   . Seizures   . Pneumonia 2005  . Arthritis   . Hypercholesteremia   . Coronary artery disease     a.  s/p PCI to RCA in 2007;  b.  Last LHC 04/2008:  LM with irregularities, LAD 50%, mid LAD 50%, proximal D1 80-90%, proximal OM1 30-40%, proximal OM2 50%, PLB branch 30-40%, site of prior angioplasty in the RCA widely patent, EF 65%=>Med Rx;  c. Nuclear study a 12/2011: EF 71% and normal perfusion.    Marland Kitchen History of echocardiogram     Echo 08/16/09: Mild LVH, EF 55-60%, no normal wall motion, grade 1 diastolic dysfunction    Current Outpatient Prescriptions  Medication Sig Dispense Refill  . aspirin EC 325 MG tablet Take 1 tablet (325 mg total) by mouth every morning. For blood thinner/heart heart  30 tablet  0  . atorvastatin (LIPITOR) 40 MG tablet Take 1 tablet (40 mg total) by mouth daily. For cholesterol  30 tablet  0  . buPROPion (WELLBUTRIN XL) 300 MG  24 hr tablet Take 1 tablet (300 mg total) by mouth daily. For depression  30 tablet  0  . LORazepam (ATIVAN) 1 MG tablet Take 1 mg by mouth every 6 (six) hours as needed for anxiety.      . metoprolol succinate (TOPROL-XL) 25 MG 24 hr tablet Take 1 tablet (25 mg total) by mouth daily. For blood pressure  30 tablet  0  . nitroGLYCERIN (NITROSTAT) 0.4 MG SL tablet Place 1 tablet (0.4 mg total) under the tongue every 5 (five) minutes as needed for chest pain (CP or SOB).  15 tablet  0  . QUEtiapine (SEROQUEL XR) 400 MG 24 hr tablet Take 400 mg by mouth at bedtime.       No current facility-administered medications for this visit.    Allergies:    Allergies  Allergen Reactions  . Ambien [Zolpidem Tartrate] Other (See Comments)      Sleep Walking  . Codeine Itching    Social History:  The patient  reports that he has been smoking.  He does not have any smokeless tobacco history on file. He reports that  drinks alcohol. He reports that he does not use illicit drugs.   ROS:  Please see the history of present illness.     All other systems reviewed and negative.   PHYSICAL EXAM: VS:  BP 163/98  Pulse 101  Ht 5\' 10"  (1.778 m)  Wt 198 lb (89.812 kg)  BMI 28.41 kg/m2 Well nourished, well developed, in no acute distress HEENT: normal Neck: no JVD Cardiac:  normal S1, S2; RRR; no murmur Lungs:  clear to auscultation bilaterally, no wheezing, rhonchi or rales Abd: soft, nontender, no hepatomegaly Ext: no edema Skin: warm and dry Neuro:  CNs 2-12 intact, no focal abnormalities noted  EKG:  Sinus tachycardia, HR 101, normal axis, inferior Q waves     ASSESSMENT AND PLAN:  1. CAD: No further chest pain. Symptoms are likely related to anxiety. He exercises several times a week without chest symptoms. No further workup indicated. Continue aspirin and statin. 2. Hypertension: Uncontrolled. Heart rate somewhat elevated today as well. Increase Toprol XL to 50 mg daily. 3. Hyperlipidemia: Continue statin. 4. Anxiety: Patient has requested refills on Ativan today. I explained him that we do not refill this medication in cardiology. He tells me that Dr. Swaziland as filled it for him in the past. I will defer decision on refills to Dr. Elvis Coil discretion. Otherwise, he should follow up with his psychiatrist. 5. Disposition: Follow up with Dr. Swaziland in 3 months.  Signed, Tereso Newcomer, PA-C  10/08/2012 12:50 PM

## 2012-10-19 ENCOUNTER — Inpatient Hospital Stay (HOSPITAL_COMMUNITY)
Admission: AD | Admit: 2012-10-19 | Discharge: 2012-10-24 | DRG: 897 | Disposition: A | Discharge status: Home / Self Care | Payer: Medicare Other | Source: Intra-hospital | Attending: Psychiatry | Admitting: Psychiatry

## 2012-10-19 ENCOUNTER — Encounter (HOSPITAL_COMMUNITY): Payer: Self-pay

## 2012-10-19 ENCOUNTER — Emergency Department (EMERGENCY_DEPARTMENT_HOSPITAL)
Admission: EM | Admit: 2012-10-19 | Discharge: 2012-10-19 | Disposition: A | Discharge status: Other Acute Inpatient Hospital | Payer: Medicare Other | Source: Home / Self Care

## 2012-10-19 DIAGNOSIS — F411 Generalized anxiety disorder: Secondary | ICD-10-CM | POA: Diagnosis present

## 2012-10-19 DIAGNOSIS — F3130 Bipolar disorder, current episode depressed, mild or moderate severity, unspecified: Secondary | ICD-10-CM | POA: Insufficient documentation

## 2012-10-19 DIAGNOSIS — F1010 Alcohol abuse, uncomplicated: Secondary | ICD-10-CM

## 2012-10-19 DIAGNOSIS — F1910 Other psychoactive substance abuse, uncomplicated: Secondary | ICD-10-CM

## 2012-10-19 DIAGNOSIS — Z8669 Personal history of other diseases of the nervous system and sense organs: Secondary | ICD-10-CM | POA: Insufficient documentation

## 2012-10-19 DIAGNOSIS — F319 Bipolar disorder, unspecified: Secondary | ICD-10-CM

## 2012-10-19 DIAGNOSIS — Z9861 Coronary angioplasty status: Secondary | ICD-10-CM | POA: Insufficient documentation

## 2012-10-19 DIAGNOSIS — F17200 Nicotine dependence, unspecified, uncomplicated: Secondary | ICD-10-CM | POA: Insufficient documentation

## 2012-10-19 DIAGNOSIS — F329 Major depressive disorder, single episode, unspecified: Secondary | ICD-10-CM

## 2012-10-19 DIAGNOSIS — E785 Hyperlipidemia, unspecified: Secondary | ICD-10-CM | POA: Insufficient documentation

## 2012-10-19 DIAGNOSIS — I2510 Atherosclerotic heart disease of native coronary artery without angina pectoris: Secondary | ICD-10-CM | POA: Diagnosis present

## 2012-10-19 DIAGNOSIS — E7800 Pure hypercholesterolemia, unspecified: Secondary | ICD-10-CM | POA: Insufficient documentation

## 2012-10-19 DIAGNOSIS — Z79899 Other long term (current) drug therapy: Secondary | ICD-10-CM

## 2012-10-19 DIAGNOSIS — F419 Anxiety disorder, unspecified: Secondary | ICD-10-CM

## 2012-10-19 DIAGNOSIS — Z9119 Patient's noncompliance with other medical treatment and regimen: Secondary | ICD-10-CM | POA: Insufficient documentation

## 2012-10-19 DIAGNOSIS — R450 Nervousness: Secondary | ICD-10-CM | POA: Insufficient documentation

## 2012-10-19 DIAGNOSIS — I10 Essential (primary) hypertension: Secondary | ICD-10-CM | POA: Insufficient documentation

## 2012-10-19 DIAGNOSIS — Z91199 Patient's noncompliance with other medical treatment and regimen due to unspecified reason: Secondary | ICD-10-CM | POA: Insufficient documentation

## 2012-10-19 DIAGNOSIS — Z8701 Personal history of pneumonia (recurrent): Secondary | ICD-10-CM | POA: Insufficient documentation

## 2012-10-19 DIAGNOSIS — F1020 Alcohol dependence, uncomplicated: Secondary | ICD-10-CM | POA: Insufficient documentation

## 2012-10-19 DIAGNOSIS — Z7982 Long term (current) use of aspirin: Secondary | ICD-10-CM | POA: Insufficient documentation

## 2012-10-19 DIAGNOSIS — Z8719 Personal history of other diseases of the digestive system: Secondary | ICD-10-CM | POA: Insufficient documentation

## 2012-10-19 DIAGNOSIS — M129 Arthropathy, unspecified: Secondary | ICD-10-CM | POA: Insufficient documentation

## 2012-10-19 LAB — COMPREHENSIVE METABOLIC PANEL
Alkaline Phosphatase: 96 U/L (ref 39–117)
BUN: 13 mg/dL (ref 6–23)
Chloride: 97 mEq/L (ref 96–112)
Creatinine, Ser: 0.99 mg/dL (ref 0.50–1.35)
GFR calc Af Amer: >90 mL/min (ref >90)
GFR calc non Af Amer: 87 mL/min — ABNORMAL LOW (ref >90)
Glucose, Bld: 119 mg/dL — ABNORMAL HIGH (ref 70–99)
Potassium: 3.6 mEq/L (ref 3.5–5.1)
Total Bilirubin: 0.2 mg/dL — ABNORMAL LOW (ref 0.3–1.2)

## 2012-10-19 LAB — CBC
HCT: 36.7 % — ABNORMAL LOW (ref 39.0–52.0)
Hemoglobin: 11.9 g/dL — ABNORMAL LOW (ref 13.0–17.0)
MCH: 24.8 pg — ABNORMAL LOW (ref 26.0–34.0)
MCV: 76.6 fL — ABNORMAL LOW (ref 78.0–100.0)
Platelets: 265 10*3/uL (ref 150–400)
RDW: 18 % — ABNORMAL HIGH (ref 11.5–15.5)
WBC: 5.4 10*3/uL (ref 4.0–10.5)

## 2012-10-19 LAB — RAPID URINE DRUG SCREEN, HOSP PERFORMED
Cocaine: NOT DETECTED
Opiates: NOT DETECTED
Tetrahydrocannabinol: NOT DETECTED

## 2012-10-19 LAB — ETHANOL: Alcohol, Ethyl (B): <11 mg/dL (ref 0–11)

## 2012-10-19 MED ORDER — THIAMINE HCL 100 MG/ML IJ SOLN: 100.0000 mg | Freq: Every day | INTRAMUSCULAR | Status: DC

## 2012-10-19 MED ORDER — FOLIC ACID 1 MG PO TABS
1.0000 mg | ORAL_TABLET | Freq: Every day | ORAL | Status: DC
  Administered 2012-10-19: 1 mg via ORAL
  Filled 2012-10-19: qty 1

## 2012-10-19 MED ORDER — ATORVASTATIN CALCIUM 40 MG PO TABS
40.0000 mg | ORAL_TABLET | Freq: Every day | ORAL | Status: DC
  Administered 2012-10-19: 40 mg via ORAL
  Filled 2012-10-19 (×2): qty 1

## 2012-10-19 MED ORDER — CHLORDIAZEPOXIDE HCL 25 MG PO CAPS: 25.0000 mg | ORAL_CAPSULE | ORAL | Status: DC

## 2012-10-19 MED ORDER — CHLORDIAZEPOXIDE HCL 25 MG PO CAPS
25.0000 mg | ORAL_CAPSULE | Freq: Once | ORAL | Status: DC
  Administered 2012-10-19: 25 mg via ORAL
  Filled 2012-10-19: qty 1

## 2012-10-19 MED ORDER — ADULT MULTIVITAMIN W/MINERALS CH
1.0000 | ORAL_TABLET | Freq: Every day | ORAL | Status: DC
  Administered 2012-10-19: 1 via ORAL
  Filled 2012-10-19: qty 1

## 2012-10-19 MED ORDER — ONDANSETRON 4 MG PO TBDP: 4.0000 mg | ORAL_TABLET | Freq: Four times a day (QID) | ORAL | Status: DC | PRN

## 2012-10-19 MED ORDER — LORAZEPAM 1 MG PO TABS
0.0000 mg | ORAL_TABLET | Freq: Two times a day (BID) | ORAL | Status: DC
Start: 2012-10-21 — End: 2012-10-19

## 2012-10-19 MED ORDER — LORAZEPAM 1 MG PO TABS: 0.0000 mg | ORAL_TABLET | Freq: Four times a day (QID) | ORAL | Status: DC

## 2012-10-19 MED ORDER — CLONIDINE HCL 0.1 MG PO TABS
0.1000 mg | ORAL_TABLET | Freq: Once | ORAL | Status: AC
  Administered 2012-10-19: 0.1 mg via ORAL
  Filled 2012-10-19: qty 1

## 2012-10-19 MED ORDER — CHLORDIAZEPOXIDE HCL 25 MG PO CAPS: 25.0000 mg | ORAL_CAPSULE | Freq: Every day | ORAL | Status: DC

## 2012-10-19 MED ORDER — ADULT MULTIVITAMIN W/MINERALS CH: 1.0000 | ORAL_TABLET | Freq: Every day | ORAL | Status: DC

## 2012-10-19 MED ORDER — NICOTINE 21 MG/24HR TD PT24
21.0000 mg | MEDICATED_PATCH | Freq: Every day | TRANSDERMAL | Status: DC
  Administered 2012-10-19: 21 mg via TRANSDERMAL
  Filled 2012-10-19: qty 1

## 2012-10-19 MED ORDER — VITAMIN B-1 100 MG PO TABS
100.0000 mg | ORAL_TABLET | Freq: Every day | ORAL | Status: DC
  Administered 2012-10-19: 15:00:00 100 mg via ORAL
  Filled 2012-10-19: qty 1

## 2012-10-19 MED ORDER — CHLORDIAZEPOXIDE HCL 25 MG PO CAPS: 25.0000 mg | ORAL_CAPSULE | Freq: Four times a day (QID) | ORAL | Status: DC | PRN

## 2012-10-19 MED ORDER — METOPROLOL SUCCINATE ER 50 MG PO TB24
50.0000 mg | ORAL_TABLET | Freq: Every day | ORAL | Status: DC
  Administered 2012-10-19: 50 mg via ORAL
  Filled 2012-10-19 (×2): qty 1

## 2012-10-19 MED ORDER — CHLORDIAZEPOXIDE HCL 25 MG PO CAPS
25.0000 mg | ORAL_CAPSULE | Freq: Four times a day (QID) | ORAL | Status: DC
  Administered 2012-10-19 (×3): 25 mg via ORAL
  Filled 2012-10-19 (×3): qty 1

## 2012-10-19 MED ORDER — LOPERAMIDE HCL 2 MG PO CAPS: 2.0000 mg | ORAL_CAPSULE | ORAL | Status: DC | PRN

## 2012-10-19 MED ORDER — CHLORDIAZEPOXIDE HCL 25 MG PO CAPS: 25.0000 mg | ORAL_CAPSULE | Freq: Three times a day (TID) | ORAL | Status: DC

## 2012-10-19 MED ORDER — HYDROXYZINE HCL 25 MG PO TABS
25.0000 mg | ORAL_TABLET | Freq: Four times a day (QID) | ORAL | Status: DC | PRN
  Administered 2012-10-19: 25 mg via ORAL
  Filled 2012-10-19: qty 1

## 2012-10-19 NOTE — BH Assessment (Signed)
Assessment Note  [name] is an 62 y.o. male.   Pt consuming alcohol daily for 6 weeks averaging about 240 oz per day.  Pt reports stressor is his new room mate who is a male and he is living in her house since June.  Pt CIWA appears to be a 4.    Pt denies suicidal or homicidal thought, intent or means.  Pt denies AVH.  Pt reports he has been to [facility] multiple times and it is where he prefers to go.  Due to pt have Medicare [facility] and [facility] will not accept.    Pt reports multiple stressors.  Pt brother has been missing for 3 years.  Pt reports "I suffer from long term depression.  Dr. Dub Mikes knows me and knows my story.  I need help and I want to go to Behavioral Health."  Pt made fair eye contact, Ox3, speech clear, affect flat and appears depressed.  Pt not getting enough sleep 4 hrs per day.    Pt last at [facility] in January for depression.  Pt is cooperative and requesting assistance with depression, mood issues and detox.  Pt is currently keeping apts with [facility] and goes there for med mgt every 3 months.    Axis I: Major Depression, Recurrent severe, Mood Disorder NOS and Alcohol Abuse Axis II: Deferred Axis III:  Past Medical History  Diagnosis Date  . MVP (mitral valve prolapse)   . Anxiety   . Hypertension   . Hyperlipidemia   . Palpitations   . Bipolar disorder   . Depression   . Esophagitis   . Gastritis   . Insomnia   . Seizures   . Pneumonia 2005  . Arthritis   . Hypercholesteremia   . Coronary artery disease     a.  s/p PCI to RCA in 2007;  b.  Last LHC 04/2008:  LM with irregularities, LAD 50%, mid LAD 50%, proximal D1 80-90%, proximal OM1 30-40%, proximal OM2 50%, PLB branch 30-40%, site of prior angioplasty in the RCA widely patent, EF 65%=>Med Rx;  c. Nuclear study a 12/2011: EF 71% and normal perfusion.    Marland Kitchen History of echocardiogram     Echo 08/16/09: Mild LVH, EF 55-60%, no normal wall motion, grade 1 diastolic dysfunction   Axis IV: housing problems, other  psychosocial or environmental problems, problems related to social environment and problems with primary support group Axis V: 41-50 serious symptoms  Past Medical History:  Past Medical History  Diagnosis Date  . MVP (mitral valve prolapse)   . Anxiety   . Hypertension   . Hyperlipidemia   . Palpitations   . Bipolar disorder   . Depression   . Esophagitis   . Gastritis   . Insomnia   . Seizures   . Pneumonia 2005  . Arthritis   . Hypercholesteremia   . Coronary artery disease     a.  s/p PCI to RCA in 2007;  b.  Last LHC 04/2008:  LM with irregularities, LAD 50%, mid LAD 50%, proximal D1 80-90%, proximal OM1 30-40%, proximal OM2 50%, PLB branch 30-40%, site of prior angioplasty in the RCA widely patent, EF 65%=>Med Rx;  c. Nuclear study a 12/2011: EF 71% and normal perfusion.    Marland Kitchen History of echocardiogram     Echo 08/16/09: Mild LVH, EF 55-60%, no normal wall motion, grade 1 diastolic dysfunction    Past Surgical History  Procedure Laterality Date  . Coronary angioplasty  2007  RCA  . Cardiac catheterization  04/2008    EF 65%  . Knee arthroscopy, medial patello femoral ligament repair    . Hip arthroplasty  bilateral   . Shoulder arthroscopy  right  . Tonsillectomy    . Total hip revision  02/07/2012    Procedure: TOTAL HIP REVISION;  Surgeon: [name], MD;  Location: [facility] OR;  Service: Orthopedics;  Laterality: Left;  [place] Hip Revision Set  . Right knee surgery      Family History:  Family History  Problem Relation Age of Onset  . Heart disease Neg Hx   . Prostate cancer Father     Social History:  reports that he has been smoking Cigarettes.  He has been smoking about 0.50 packs per day. He does not have any smokeless tobacco history on file. He reports that he drinks about 14.4 ounces of alcohol per week. He reports that he does not use illicit drugs.  Additional Social History:  Alcohol / Drug Use Pain Medications: na Prescriptions: na Over the  Counter: na History of alcohol / drug use?: Yes Substance #1 Name of Substance 1: alcohol 1 - Age of First Use: 20s 1 - Amount (size/oz): 240oz 1 - Frequency: daily 1 - Duration: 6 weeks 1 - Last Use / Amount: 10-19-12  CIWA: CIWA-Ar BP: 144/96 mmHg Pulse Rate: 101 Nausea and Vomiting: no nausea and no vomiting Tactile Disturbances: none Tremor: no tremor Auditory Disturbances: not present Paroxysmal Sweats: no sweat visible Visual Disturbances: not present Anxiety: two Headache, Fullness in Head: very mild Agitation: somewhat more than normal activity Orientation and Clouding of Sensorium: oriented and can do serial additions CIWA-Ar Total: 4 COWS:    Allergies:  Allergies  Allergen Reactions  . Ambien [Zolpidem Tartrate] Other (See Comments)    Sleep Walking  . Codeine Itching    Home Medications:  (Not in a hospital admission)  OB/GYN Status:  No LMP for male patient.  General Assessment Data Location of Assessment: [facility] ED ACT Assessment: Yes Is this a Tele or Face-to-Face Assessment?: Face-to-Face Is this an Initial Assessment or a Re-assessment for this encounter?: Initial Assessment Living Arrangements: Other (Comment) (new room mate as of June) Can pt return to current living arrangement?: Yes Admission Status: Voluntary Is patient capable of signing voluntary admission?: Yes Transfer from: Acute Hospital Referral Source: MD  Medical Screening Exam [facility] Walk-in ONLY) Medical Exam completed: Yes  [facility] Crisis Care Plan Living Arrangements: Other (Comment) (new room mate as of June)  Education Status Is patient currently in school?: No  Risk to self Suicidal Ideation: No Suicidal Intent: No Is patient at risk for suicide?: No Suicidal Plan?: No Access to Means: No What has been your use of drugs/alcohol within the last 12 months?: alcohol Previous Attempts/Gestures: No How many times?: 0 Other Self Harm Risks: na Triggers for Past Attempts: None  known Intentional Self Injurious Behavior: None Family Suicide History: No Recent stressful life event(s): Conflict (Comment) (with room mate) Persecutory voices/beliefs?: No Depression: Yes Depression Symptoms: Tearfulness;Isolating;Fatigue;Guilt;Loss of interest in usual pleasures;Feeling worthless/self pity;Feeling angry/irritable Substance abuse history and/or treatment for substance abuse?: Yes Suicide prevention information given to non-admitted patients: Not applicable  Risk to Others Homicidal Ideation: No Thoughts of Harm to Others: No Current Homicidal Intent: No Current Homicidal Plan: No Access to Homicidal Means: No Identified Victim: na History of harm to others?: No Assessment of Violence: None Noted Violent Behavior Description: cooperative Does patient have access to weapons?: No Criminal  Charges Pending?: No Does patient have a court date: No  Psychosis Hallucinations: None noted Delusions: None noted  Mental Status Report Appear/Hygiene: Disheveled Eye Contact: Fair Motor Activity: Unremarkable Speech: Logical/coherent;Soft Level of Consciousness: Alert Mood: Depressed;Labile;Worthless, low self-esteem Affect: Anxious;Appropriate to circumstance;Depressed Anxiety Level: Moderate Thought Processes: Coherent Judgement: Unimpaired Orientation: Person;Place;Situation Obsessive Compulsive Thoughts/Behaviors: None  Cognitive Functioning Concentration: Decreased Memory: Recent Intact;Remote Intact IQ: Average Insight: Poor Impulse Control: Poor Appetite: Fair Weight Loss: 0 Weight Gain: 0 Sleep: Decreased Total Hours of Sleep: 4 Vegetative Symptoms: None  ADLScreening [facility] Assessment Services) Patient's cognitive ability adequate to safely complete daily activities?: Yes Patient able to express need for assistance with ADLs?: Yes Independently performs ADLs?: Yes (appropriate for developmental age)  Prior Inpatient Therapy Prior Inpatient  Therapy: Yes Prior Therapy Dates: January 2014 Prior Therapy Facilty/Provider(s): [facility] Reason for Treatment: depression  Prior Outpatient Therapy Prior Outpatient Therapy: Yes Prior Therapy Dates: 2014 Prior Therapy Facilty/Provider(s): [facility] Reason for Treatment: med mgt  ADL Screening (condition at time of admission) Patient's cognitive ability adequate to safely complete daily activities?: Yes Is the patient deaf or have difficulty hearing?: No Does the patient have difficulty seeing, even when wearing glasses/contacts?: No Does the patient have difficulty concentrating, remembering, or making decisions?: No Patient able to express need for assistance with ADLs?: Yes Does the patient have difficulty dressing or bathing?: No Independently performs ADLs?: Yes (appropriate for developmental age) Does the patient have difficulty walking or climbing stairs?: No Weakness of Legs: None Weakness of Arms/Hands: None  Home Assistive Devices/Equipment Home Assistive Devices/Equipment: None  Therapy Consults (therapy consults require a physician order) PT Evaluation Needed: No OT Evalulation Needed: No SLP Evaluation Needed: No Abuse/Neglect Assessment (Assessment to be complete while patient is alone) Physical Abuse: Denies Verbal Abuse: Denies Sexual Abuse: Denies Exploitation of patient/patient's resources: Denies Self-Neglect: Denies Values / Beliefs Cultural Requests During Hospitalization: None Spiritual Requests During Hospitalization: None Consults Spiritual Care Consult Needed: No Social Work Consult Needed: No Merchant navy officer (For Healthcare) Advance Directive: Patient does not have advance directive Pre-existing out of facility DNR order (yellow form or pink MOST form): No    Additional Information 1:1 In Past 12 Months?: No CIRT Risk: No Elopement Risk: No Does patient have medical clearance?: No (lab work still pending)     Disposition:   Disposition Initial Assessment Completed for this Encounter: Yes Disposition of Patient: Inpatient treatment program Type of inpatient treatment program: Adult  On Site Evaluation by:   Reviewed with Physician:    Titus Mould, Eppie Gibson 10/19/2012 1:13 PM

## 2012-10-19 NOTE — ED Notes (Addendum)
Pt requesting detox from [facility].  Pt normally drinks equivalent of 24 beers/day.  His last drink was at 10am today, had 2 "ice beers, which is equivalent to 4 regular beers"  Denies SI/HI.  States he'd like to go to [facility] for detox as he has been through this before.  Pt is calm, pleasant and cooperative.  Denies pain but states he's starting to shake a little bit.

## 2012-10-19 NOTE — ED Notes (Signed)
OK to transfer to [facility]  Dr Freida Busman

## 2012-10-19 NOTE — ED Notes (Signed)
Give librium 25mg  now  [facility] Dr Freida Busman

## 2012-10-19 NOTE — ED Notes (Signed)
Patient's watch and ring sent home with patient's friend.

## 2012-10-19 NOTE — Consult Note (Signed)
[facility] Face-to-Face Psychiatry Consult   Reason for Consult:  Evaluation for inpatient treatment alcohol detox and depression Referring Physician:  EDP  [name] is an 62 y.o. male.  Assessment: AXIS I:  Alcohol Abuse, Anxiety Disorder NOS and Depressive Disorder NOS AXIS II:  Deferred AXIS III:   Past Medical History  Diagnosis Date  . MVP (mitral valve prolapse)   . Anxiety   . Hypertension   . Hyperlipidemia   . Palpitations   . Bipolar disorder   . Depression   . Esophagitis   . Gastritis   . Insomnia   . Seizures   . Pneumonia 2005  . Arthritis   . Hypercholesteremia   . Coronary artery disease     a.  s/p PCI to RCA in 2007;  b.  Last LHC 04/2008:  LM with irregularities, LAD 50%, mid LAD 50%, proximal D1 80-90%, proximal OM1 30-40%, proximal OM2 50%, PLB branch 30-40%, site of prior angioplasty in the RCA widely patent, EF 65%=>Med Rx;  c. Nuclear study a 12/2011: EF 71% and normal perfusion.    Marland Kitchen History of echocardiogram     Echo 08/16/09: Mild LVH, EF 55-60%, no normal wall motion, grade 1 diastolic dysfunction   AXIS IV:  other psychosocial or environmental problems, problems related to social environment and problems with primary support group AXIS V:  51-60 moderate symptoms  Plan:  No evidence of imminent risk to self or others at present.   Inpatient detox  Subjective:   [name] is a 62 y.o. male   HPI:  Patient presents to [facility] requesting assistance with detox from alcohol.  Patient states that he is drinking 24 beers a day (Icehouse). Patient states that he tried to detox at home but after 2-3 hours he would start to shake and would have to drink another been to stop the shakes.  Patient denies suicidal ideation, homicidal ideation, psychosis, and paranoia. Patient states that he is depressed and has outpatient services through [facility] and he goes every 3 months for medication management but is requesting outpatient rehab services this time.   HPI Elements:    Location:  [facility] ED. Quality:  Affecting patient mentally and physically. Severity:  Patient drinking 24 beers aday.  Past Psychiatric History: Past Medical History  Diagnosis Date  . MVP (mitral valve prolapse)   . Anxiety   . Hypertension   . Hyperlipidemia   . Palpitations   . Bipolar disorder   . Depression   . Esophagitis   . Gastritis   . Insomnia   . Seizures   . Pneumonia 2005  . Arthritis   . Hypercholesteremia   . Coronary artery disease     a.  s/p PCI to RCA in 2007;  b.  Last LHC 04/2008:  LM with irregularities, LAD 50%, mid LAD 50%, proximal D1 80-90%, proximal OM1 30-40%, proximal OM2 50%, PLB branch 30-40%, site of prior angioplasty in the RCA widely patent, EF 65%=>Med Rx;  c. Nuclear study a 12/2011: EF 71% and normal perfusion.    Marland Kitchen History of echocardiogram     Echo 08/16/09: Mild LVH, EF 55-60%, no normal wall motion, grade 1 diastolic dysfunction    reports that he has been smoking Cigarettes.  He has been smoking about 0.50 packs per day. He does not have any smokeless tobacco history on file. He reports that he drinks about 14.4 ounces of alcohol per week. He reports that he does not use  illicit drugs. Family History  Problem Relation Age of Onset  . Heart disease Neg Hx   . Prostate cancer Father    Family History Substance Abuse: No Family Supports: No Living Arrangements: Other (Comment) (new room mate as of June) Can pt return to current living arrangement?: Yes Abuse/Neglect [facility]) Physical Abuse: Denies Verbal Abuse: Denies Sexual Abuse: Denies Allergies:   Allergies  Allergen Reactions  . Ambien [Zolpidem Tartrate] Other (See Comments)    Sleep Walking  . Codeine Itching    ACT Assessment Complete:  Yes:    Educational Status    Risk to Self: Risk to self Suicidal Ideation: No Suicidal Intent: No Is patient at risk for suicide?: No Suicidal Plan?: No Access to Means: No What has been your use of drugs/alcohol  within the last 12 months?: alcohol Previous Attempts/Gestures: No How many times?: 0 Other Self Harm Risks: na Triggers for Past Attempts: None known Intentional Self Injurious Behavior: None Family Suicide History: No Recent stressful life event(s): Conflict (Comment) (with room mate) Persecutory voices/beliefs?: No Depression: Yes Depression Symptoms: Tearfulness;Isolating;Fatigue;Guilt;Loss of interest in usual pleasures;Feeling worthless/self pity;Feeling angry/irritable Substance abuse history and/or treatment for substance abuse?: Yes Suicide prevention information given to non-admitted patients: Not applicable  Risk to Others: Risk to Others Homicidal Ideation: No Thoughts of Harm to Others: No Current Homicidal Intent: No Current Homicidal Plan: No Access to Homicidal Means: No Identified Victim: na History of harm to others?: No Assessment of Violence: None Noted Violent Behavior Description: cooperative Does patient have access to weapons?: No Criminal Charges Pending?: No Does patient have a court date: No  Abuse: Abuse/Neglect Assessment (Assessment to be complete while patient is alone) Physical Abuse: Denies Verbal Abuse: Denies Sexual Abuse: Denies Exploitation of patient/patient's resources: Denies Self-Neglect: Denies  Prior Inpatient Therapy: Prior Inpatient Therapy Prior Inpatient Therapy: Yes Prior Therapy Dates: January 2014 Prior Therapy Facilty/Provider(s): [facility] Reason for Treatment: depression  Prior Outpatient Therapy: Prior Outpatient Therapy Prior Outpatient Therapy: Yes Prior Therapy Dates: 2014 Prior Therapy Facilty/Provider(s): [facility] Reason for Treatment: med mgt  Additional Information: Additional Information 1:1 In Past 12 Months?: No CIRT Risk: No Elopement Risk: No Does patient have medical clearance?: No (lab work still pending)                  Objective: Blood pressure 152/96, pulse 93, temperature 98.5 F (36.9  C), temperature source Oral, resp. rate 17, SpO2 95.00%.There is no weight on file to calculate BMI. Results for orders placed during the hospital encounter of 10/19/12 (from the past 72 hour(s))  CBC     Status: Abnormal   Collection Time    10/19/12 12:30 PM      Result Value Range   WBC 5.4  4.0 - 10.5 K/uL   RBC 4.79  4.22 - 5.81 MIL/uL   Hemoglobin 11.9 (*) 13.0 - 17.0 g/dL   HCT 16.1 (*) 09.6 - 04.5 %   MCV 76.6 (*) 78.0 - 100.0 fL   MCH 24.8 (*) 26.0 - 34.0 pg   MCHC 32.4  30.0 - 36.0 g/dL   RDW 40.9 (*) 81.1 - 91.4 %   Platelets 265  150 - 400 K/uL  COMPREHENSIVE METABOLIC PANEL     Status: Abnormal   Collection Time    10/19/12 12:30 PM      Result Value Range   Sodium 132 (*) 135 - 145 mEq/L   Potassium 3.6  3.5 - 5.1 mEq/L   Chloride 97  96 -  112 mEq/L   CO2 20  19 - 32 mEq/L   Glucose, Bld 119 (*) 70 - 99 mg/dL   BUN 13  6 - 23 mg/dL   Creatinine, Ser 7.82  0.50 - 1.35 mg/dL   Calcium 9.9  8.4 - 95.6 mg/dL   Total Protein 7.1  6.0 - 8.3 g/dL   Albumin 3.9  3.5 - 5.2 g/dL   AST 26  0 - 37 U/L   ALT 27  0 - 53 U/L   Alkaline Phosphatase 96  39 - 117 U/L   Total Bilirubin 0.2 (*) 0.3 - 1.2 mg/dL   GFR calc non Af Amer 87 (*) >90 mL/min   GFR calc Af Amer >90  >90 mL/min   Comment: (NOTE)     The eGFR has been calculated using the CKD EPI equation.     This calculation has not been validated in all clinical situations.     eGFR's persistently <90 mL/min signify possible Chronic Kidney     Disease.  ETHANOL     Status: None   Collection Time    10/19/12 12:30 PM      Result Value Range   Alcohol, Ethyl (B) <11  0 - 11 mg/dL   Comment:            LOWEST DETECTABLE LIMIT FOR     SERUM ALCOHOL IS 11 mg/dL     FOR MEDICAL PURPOSES ONLY   Current Facility-Administered Medications  Medication Dose Route Frequency Provider Last Rate Last Dose  . folic acid (FOLVITE) tablet 1 mg  1 mg Oral Daily Suzi Roots, MD      . LORazepam (ATIVAN) tablet 0-4 mg  0-4 mg  Oral Q6H Suzi Roots, MD       Followed by  . [START ON 10/21/2012] LORazepam (ATIVAN) tablet 0-4 mg  0-4 mg Oral Q12H Suzi Roots, MD      . multivitamin with minerals tablet 1 tablet  1 tablet Oral Daily Suzi Roots, MD      . thiamine (VITAMIN B-1) tablet 100 mg  100 mg Oral Daily Suzi Roots, MD       Or  . thiamine (B-1) injection 100 mg  100 mg Intravenous Daily Suzi Roots, MD       Current Outpatient Prescriptions  Medication Sig Dispense Refill  . aspirin EC 325 MG tablet Take 1 tablet (325 mg total) by mouth every morning. For blood thinner/heart heart  30 tablet  0  . atorvastatin (LIPITOR) 40 MG tablet Take 1 tablet (40 mg total) by mouth daily. For cholesterol  30 tablet  0  . buPROPion (WELLBUTRIN SR) 150 MG 12 hr tablet Take 300 mg by mouth at bedtime.      Marland Kitchen LORazepam (ATIVAN) 1 MG tablet Take 1 mg by mouth every 6 (six) hours as needed for anxiety.      . metoprolol succinate (TOPROL-XL) 25 MG 24 hr tablet Take 2 tablets (50 mg total) by mouth daily. For blood pressure  60 tablet  6  . nitroGLYCERIN (NITROSTAT) 0.4 MG SL tablet Place 1 tablet (0.4 mg total) under the tongue every 5 (five) minutes as needed for chest pain (CP or SOB).  15 tablet  0  . QUEtiapine (SEROQUEL XR) 50 MG TB24 24 hr tablet Take 50-200 mg by mouth 2 (two) times daily. 1 tab in am, 4 tab in pm        Psychiatric Specialty Exam:  Blood pressure 152/96, pulse 93, temperature 98.5 F (36.9 C), temperature source Oral, resp. rate 17, SpO2 95.00%.There is no weight on file to calculate BMI.  General Appearance: Casual and Fairly Groomed  Eye Contact::  Good  Speech:  Blocked and Normal Rate  Volume:  Normal  Mood:  Depressed  Affect:  Depressed  Thought Process:  Circumstantial and Goal Directed  Orientation:  Full (Time, Place, and Person)  Thought Content:  Rumination  Suicidal Thoughts:  No  Homicidal Thoughts:  No  Memory:  Immediate;   Good Recent;   Good Remote;   Good   Judgement:  Fair  Insight:  Fair  Psychomotor Activity:  Tremor  Concentration:  Fair  Recall:  Good  Akathisia:  No  Handed:  Right  AIMS (if indicated):     Assets:  Communication Skills Desire for Improvement Housing Social Support Transportation  Sleep:      Treatment Plan Summary: Daily contact with patient to assess and evaluate symptoms and progress in treatment Medication management Inpatient treatment  Disposition:  Inpatient treatment; Patient accepted to [facility] 300 hall pending bed availability.  If no bed available please seek placement else where.   1. Admit for crisis management and stabilization.  2. Review and initiate  medications pertinent to patient illness and treatment.  3. Medication management to reduce current symptoms to base line and improve the         patient's overall level of functioning.   Start Librium protocol and home medications.     Assunta Found, FNP-BC 10/19/2012 2:16 PM  I agreed with the findings, treatment and disposition plan of this patient. Kathryne Sharper, MD

## 2012-10-19 NOTE — BH Assessment (Signed)
Patient accepted by Assunta Found, NP to [facility]. Attending MD Dub Mikes, bed assigned #302-2.

## 2012-10-19 NOTE — ED Notes (Signed)
Pt has been accepted to [facility] room 302-2 per [facility]

## 2012-10-19 NOTE — ED Notes (Signed)
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  Pt also reported that he has not had it in 3 days because he ran oput and was afraid to drive to the pharmacy

## 2012-10-19 NOTE — ED Notes (Signed)
Julieanne Cotton contacted --transport delayed until BP is decreased.

## 2012-10-19 NOTE — ED Notes (Signed)
Up to the bathroom 

## 2012-10-19 NOTE — ED Notes (Signed)
Patient denies any current pain, denies any current withdrawal symptoms, denies suicidality, homicidality, or auditory or visual hallucinations. Patient presents pleasant and cooperative.

## 2012-10-19 NOTE — ED Provider Notes (Signed)
CSN: [id number]     Arrival date & time 10/19/12  1154 History   First MD Initiated Contact with Patient 10/19/12 1203     Chief complaint: alcohol abuse   (Consider location/radiation/quality/duration/timing/severity/associated sxs/prior Treatment) The history is provided by the patient.  pt states hx depression and etoh abuse, here today stating has been drinking daily, more heavily, for the past 2 months, approximately 12 beers/day, and is looking for detox/rehab. Also notes intermittent feelings of anxiety and depression. No si. Hx same. States compliant w normal meds. Is eating and drinking normally.  When stops drinking feels shaky, but denies hx dts, szs, or complicated etoh withdrawal.  States otherwise physical health at baseline.     Past Medical History  Diagnosis Date  . MVP (mitral valve prolapse)   . Anxiety   . Hypertension   . Hyperlipidemia   . Palpitations   . Bipolar disorder   . Depression   . Esophagitis   . Gastritis   . Insomnia   . Seizures   . Pneumonia 2005  . Arthritis   . Hypercholesteremia   . Coronary artery disease     a.  s/p PCI to RCA in 2007;  b.  Last LHC 04/2008:  LM with irregularities, LAD 50%, mid LAD 50%, proximal D1 80-90%, proximal OM1 30-40%, proximal OM2 50%, PLB branch 30-40%, site of prior angioplasty in the RCA widely patent, EF 65%=>Med Rx;  c. Nuclear study a 12/2011: EF 71% and normal perfusion.    Marland Kitchen History of echocardiogram     Echo 08/16/09: Mild LVH, EF 55-60%, no normal wall motion, grade 1 diastolic dysfunction   Past Surgical History  Procedure Laterality Date  . Coronary angioplasty  2007    RCA  . Cardiac catheterization  04/2008    EF 65%  . Knee arthroscopy, medial patello femoral ligament repair    . Hip arthroplasty  bilateral   . Shoulder arthroscopy  right  . Tonsillectomy    . Total hip revision  02/07/2012    Procedure: TOTAL HIP REVISION;  Surgeon: [name], MD;  Location: [facility] OR;  Service: Orthopedics;   Laterality: Left;  [place] Hip Revision Set  . Right knee surgery     Family History  Problem Relation Age of Onset  . Heart disease Neg Hx   . Prostate cancer Father    History  Substance Use Topics  . Smoking status: Light Tobacco Smoker    Last Attempt to Quit: 10/22/2011  . Smokeless tobacco: Not on file  . Alcohol Use: 0.0 oz/week    12-15 Cans of beer per week    Review of Systems  Constitutional: Negative for fever.  HENT: Negative for neck pain.   Eyes: Negative for redness.  Respiratory: Negative for cough and shortness of breath.   Cardiovascular: Negative for chest pain.  Gastrointestinal: Negative for abdominal pain.  Genitourinary: Negative for flank pain.  Musculoskeletal: Negative for back pain.  Skin: Negative for rash.  Neurological: Negative for headaches.  Hematological: Does not bruise/bleed easily.  Psychiatric/Behavioral: The patient is nervous/anxious.     Allergies  Ambien and Codeine  Home Medications   Current Outpatient Rx  Name  Route  Sig  Dispense  Refill  . aspirin EC 325 MG tablet   Oral   Take 1 tablet (325 mg total) by mouth every morning. For blood thinner/heart heart   30 tablet   0   . atorvastatin (LIPITOR) 40 MG tablet   Oral  Take 1 tablet (40 mg total) by mouth daily. For cholesterol   30 tablet   0   . buPROPion (WELLBUTRIN XL) 300 MG 24 hr tablet   Oral   Take 1 tablet (300 mg total) by mouth daily. For depression   30 tablet   0   . LORazepam (ATIVAN) 1 MG tablet   Oral   Take 1 mg by mouth every 6 (six) hours as needed for anxiety.         . metoprolol succinate (TOPROL-XL) 25 MG 24 hr tablet   Oral   Take 2 tablets (50 mg total) by mouth daily. For blood pressure   60 tablet   6   . nitroGLYCERIN (NITROSTAT) 0.4 MG SL tablet   Sublingual   Place 1 tablet (0.4 mg total) under the tongue every 5 (five) minutes as needed for chest pain (CP or SOB).   15 tablet   0   . QUEtiapine (SEROQUEL XR) 400  MG 24 hr tablet   Oral   Take 400 mg by mouth at bedtime.          BP 144/96  Pulse 101  Temp(Src) 98.2 F (36.8 C) (Oral)  Resp 16  SpO2 98% Physical Exam  Nursing note and vitals reviewed. Constitutional: He is oriented to person, place, and time. He appears well-developed and well-nourished. No distress.  HENT:  Head: Atraumatic.  Eyes: Conjunctivae are normal. No scleral icterus.  Neck: Neck supple. No tracheal deviation present.  Cardiovascular: Normal rate.   Pulmonary/Chest: Effort normal and breath sounds normal. No accessory muscle usage. No respiratory distress.  Abdominal: Soft. He exhibits no distension. There is no tenderness.  Musculoskeletal: Normal range of motion. He exhibits no edema.  Neurological: He is alert and oriented to person, place, and time.  Skin: Skin is warm and dry. He is not diaphoretic.  Psychiatric:  Mildly anxious.     ED Course  Procedures (including critical care time)  Results for orders placed during the hospital encounter of 10/19/12  CBC      Result Value Range   WBC 5.4  4.0 - 10.5 K/uL   RBC 4.79  4.22 - 5.81 MIL/uL   Hemoglobin 11.9 (*) 13.0 - 17.0 g/dL   HCT 19.1 (*) 47.8 - 29.5 %   MCV 76.6 (*) 78.0 - 100.0 fL   MCH 24.8 (*) 26.0 - 34.0 pg   MCHC 32.4  30.0 - 36.0 g/dL   RDW 62.1 (*) 30.8 - 65.7 %   Platelets 265  150 - 400 K/uL  COMPREHENSIVE METABOLIC PANEL      Result Value Range   Sodium 132 (*) 135 - 145 mEq/L   Potassium 3.6  3.5 - 5.1 mEq/L   Chloride 97  96 - 112 mEq/L   CO2 20  19 - 32 mEq/L   Glucose, Bld 119 (*) 70 - 99 mg/dL   BUN 13  6 - 23 mg/dL   Creatinine, Ser 8.46  0.50 - 1.35 mg/dL   Calcium 9.9  8.4 - 96.2 mg/dL   Total Protein 7.1  6.0 - 8.3 g/dL   Albumin 3.9  3.5 - 5.2 g/dL   AST 26  0 - 37 U/L   ALT 27  0 - 53 U/L   Alkaline Phosphatase 96  39 - 117 U/L   Total Bilirubin 0.2 (*) 0.3 - 1.2 mg/dL   GFR calc non Af Amer 87 (*) >90 mL/min   GFR calc Af Amer >  90  >90 mL/min  ETHANOL       Result Value Range   Alcohol, Ethyl (B) <11  0 - 11 mg/dL     MDM  Labs. Psych team called.   Reviewed nursing notes and prior charts for additional history.   Recheck pt alert, content, no distress.  Psych team evaluation and placement pending.     Suzi Roots, MD 10/19/12 1341

## 2012-10-19 NOTE — ED Notes (Signed)
Shuvon NP into see 

## 2012-10-20 ENCOUNTER — Encounter (HOSPITAL_COMMUNITY): Payer: Self-pay

## 2012-10-20 DIAGNOSIS — F330 Major depressive disorder, recurrent, mild: Secondary | ICD-10-CM

## 2012-10-20 DIAGNOSIS — F1010 Alcohol abuse, uncomplicated: Secondary | ICD-10-CM

## 2012-10-20 DIAGNOSIS — F1994 Other psychoactive substance use, unspecified with psychoactive substance-induced mood disorder: Secondary | ICD-10-CM

## 2012-10-20 MED ORDER — CHLORDIAZEPOXIDE HCL 25 MG PO CAPS
25.0000 mg | ORAL_CAPSULE | Freq: Every day | ORAL | Status: AC
  Administered 2012-10-23: 25 mg via ORAL
  Filled 2012-10-20: qty 1

## 2012-10-20 MED ORDER — BUPROPION HCL ER (XL) 300 MG PO TB24
300.0000 mg | ORAL_TABLET | Freq: Every day | ORAL | Status: DC
  Administered 2012-10-20 – 2012-10-24 (×5): 300 mg via ORAL
  Filled 2012-10-20 (×7): qty 1

## 2012-10-20 MED ORDER — GABAPENTIN 300 MG PO CAPS
300.0000 mg | ORAL_CAPSULE | Freq: Three times a day (TID) | ORAL | Status: DC
  Administered 2012-10-20 – 2012-10-24 (×11): 300 mg via ORAL
  Filled 2012-10-20 (×18): qty 1

## 2012-10-20 MED ORDER — MIRTAZAPINE 15 MG PO TABS
15.0000 mg | ORAL_TABLET | Freq: Every day | ORAL | Status: DC
  Administered 2012-10-20 – 2012-10-21 (×2): 15 mg via ORAL
  Filled 2012-10-20 (×3): qty 1

## 2012-10-20 MED ORDER — FOLIC ACID 1 MG PO TABS
1.0000 mg | ORAL_TABLET | Freq: Every day | ORAL | Status: DC
  Administered 2012-10-20 – 2012-10-24 (×5): 1 mg via ORAL
  Filled 2012-10-20 (×7): qty 1

## 2012-10-20 MED ORDER — ONDANSETRON 4 MG PO TBDP: 4.0000 mg | ORAL_TABLET | Freq: Four times a day (QID) | ORAL | Status: AC | PRN

## 2012-10-20 MED ORDER — HYDROXYZINE HCL 25 MG PO TABS
25.0000 mg | ORAL_TABLET | Freq: Four times a day (QID) | ORAL | Status: AC | PRN
  Administered 2012-10-20: 25 mg via ORAL
  Filled 2012-10-20: qty 1

## 2012-10-20 MED ORDER — CHLORDIAZEPOXIDE HCL 25 MG PO CAPS
25.0000 mg | ORAL_CAPSULE | ORAL | Status: AC
  Administered 2012-10-22 (×2): 25 mg via ORAL
  Filled 2012-10-20 (×2): qty 1

## 2012-10-20 MED ORDER — CHLORDIAZEPOXIDE HCL 25 MG PO CAPS
25.0000 mg | ORAL_CAPSULE | Freq: Four times a day (QID) | ORAL | Status: AC
  Administered 2012-10-20 (×4): 25 mg via ORAL
  Filled 2012-10-20 (×3): qty 1

## 2012-10-20 MED ORDER — THIAMINE HCL 100 MG/ML IJ SOLN
INTRAMUSCULAR | Status: AC
  Administered 2012-10-20: 200 mg
  Filled 2012-10-20: qty 2

## 2012-10-20 MED ORDER — VITAMIN B-1 100 MG PO TABS
100.0000 mg | ORAL_TABLET | Freq: Every day | ORAL | Status: DC
  Administered 2012-10-20 – 2012-10-24 (×5): 100 mg via ORAL
  Filled 2012-10-20 (×7): qty 1

## 2012-10-20 MED ORDER — ADULT MULTIVITAMIN W/MINERALS CH: 1.0000 | ORAL_TABLET | Freq: Every day | ORAL | Status: DC

## 2012-10-20 MED ORDER — ACETAMINOPHEN 325 MG PO TABS
650.0000 mg | ORAL_TABLET | Freq: Four times a day (QID) | ORAL | Status: DC | PRN
  Administered 2012-10-20 – 2012-10-23 (×2): 650 mg via ORAL

## 2012-10-20 MED ORDER — CHLORDIAZEPOXIDE HCL 25 MG PO CAPS
25.0000 mg | ORAL_CAPSULE | Freq: Once | ORAL | Status: AC
  Administered 2012-10-20: 25 mg via ORAL
  Filled 2012-10-20: qty 1

## 2012-10-20 MED ORDER — ADULT MULTIVITAMIN W/MINERALS CH
1.0000 | ORAL_TABLET | Freq: Every day | ORAL | Status: DC
  Administered 2012-10-20 – 2012-10-24 (×5): 1 via ORAL
  Filled 2012-10-20 (×7): qty 1

## 2012-10-20 MED ORDER — ALUM & MAG HYDROXIDE-SIMETH 200-200-20 MG/5ML PO SUSP: 30.0000 mL | ORAL | Status: DC | PRN

## 2012-10-20 MED ORDER — CHLORDIAZEPOXIDE HCL 25 MG PO CAPS
25.0000 mg | ORAL_CAPSULE | Freq: Four times a day (QID) | ORAL | Status: AC | PRN
  Administered 2012-10-21: 25 mg via ORAL
  Filled 2012-10-20 (×3): qty 1

## 2012-10-20 MED ORDER — MAGNESIUM HYDROXIDE 400 MG/5ML PO SUSP: 30.0000 mL | Freq: Every day | ORAL | Status: DC | PRN

## 2012-10-20 MED ORDER — THIAMINE HCL 100 MG/ML IJ SOLN: 100.0000 mg | Freq: Every day | INTRAMUSCULAR | Status: DC

## 2012-10-20 MED ORDER — LOPERAMIDE HCL 2 MG PO CAPS: 2.0000 mg | ORAL_CAPSULE | ORAL | Status: AC | PRN

## 2012-10-20 MED ORDER — CHLORDIAZEPOXIDE HCL 25 MG PO CAPS
25.0000 mg | ORAL_CAPSULE | Freq: Three times a day (TID) | ORAL | Status: AC
  Administered 2012-10-21 (×3): 25 mg via ORAL
  Filled 2012-10-20 (×3): qty 1

## 2012-10-20 MED ORDER — QUETIAPINE FUMARATE ER 200 MG PO TB24
200.0000 mg | ORAL_TABLET | Freq: Every day | ORAL | Status: DC
  Administered 2012-10-20 – 2012-10-23 (×4): 200 mg via ORAL
  Filled 2012-10-20 (×6): qty 1

## 2012-10-20 MED ORDER — NICOTINE 21 MG/24HR TD PT24
21.0000 mg | MEDICATED_PATCH | Freq: Every day | TRANSDERMAL | Status: DC
  Administered 2012-10-20 – 2012-10-24 (×5): 21 mg via TRANSDERMAL
  Filled 2012-10-20 (×7): qty 1

## 2012-10-20 MED ORDER — QUETIAPINE FUMARATE ER 200 MG PO TB24
200.0000 mg | ORAL_TABLET | Freq: Every day | ORAL | Status: DC
  Filled 2012-10-20: qty 1

## 2012-10-20 NOTE — [facility] Group Notes (Signed)
[facility] Group Notes: (Clinical Social Work)   10/20/2012      Type of Therapy:  Group Therapy   Participation Level:  Did Not Attend    Ambrose Mantle, LCSW 10/20/2012, 12:28 PM

## 2012-10-20 NOTE — Progress Notes (Signed)
Patient ID: [name], male   DOB: 10/05/1950, 62 y.o.   MRN: [id number]  D: Pt has been appropriate on the unit today, pt has been happy all day and has attended all groups and engaged in treatment. Pt reported very little withdrawal symptoms, and has taken schedule medication without any problems. Pt reported being negative SI/HI, no AH/VH noted. A: 15 minute checks continued for patient safety. R: Pts safety maintained.

## 2012-10-20 NOTE — Progress Notes (Signed)
Patient admitted voluntarily for ETOH detox. Patient reports that he has been drinking 24 (icehouse) beers daily. Patient reports that whenever he would try to stop drinking on his own at home he would have shakes really bad. Patient voiced no other complaints, denies si/hi/a/v hallucinations. Patient was pleasant and cooperative during admission, oriented to unit. Safety maintained with 15 min checks, will continue to monitor. Patient received snacks, fluids and scheduled meds.

## 2012-10-20 NOTE — Tx Team (Signed)
Initial Interdisciplinary Treatment Plan  PATIENT STRENGTHS: (choose at least two) Ability for insight Active sense of humor Motivation for treatment/growth  PATIENT STRESSORS: Substance abuse   PROBLEM LIST: Problem List/Patient Goals Date to be addressed Date deferred Reason deferred Estimated date of resolution  ETOH detox                                                       DISCHARGE CRITERIA:  Ability to meet basic life and health needs Adequate post-discharge living arrangements Verbal commitment to aftercare and medication compliance Withdrawal symptoms are absent or subacute and managed without 24-hour nursing intervention  PRELIMINARY DISCHARGE PLAN: Return to previous living arrangement  PATIENT/FAMIILY INVOLVEMENT: This treatment plan has been presented to and reviewed with the patient, [name], and/or family member.  The patient and family have been given the opportunity to ask questions and make suggestions.  Floyce Stakes 10/20/2012, 1:59 AM

## 2012-10-20 NOTE — [facility] Suicide Risk Assessment (Signed)
Suicide Risk Assessment  Admission Assessment     Nursing information obtained from:  Patient Demographic factors:  Male;Gay, lesbian, or bisexual orientation Current Mental Status:  NA Loss Factors:  NA Historical Factors:  Family history of suicide;Family history of mental illness or substance abuse Risk Reduction Factors:  Positive social support;Positive therapeutic relationship;Positive coping skills or problem solving skills  CLINICAL FACTORS:   Depression:   Anhedonia Severe Alcohol/Substance Abuse/Dependencies  COGNITIVE FEATURES THAT CONTRIBUTE TO RISK:  Polarized thinking    SUICIDE RISK:   Minimal: No identifiable suicidal ideation.  Patients presenting with no risk factors but with morbid ruminations; may be classified as minimal risk based on the severity of the depressive symptoms  PLAN OF CARE: Observation Level/Precautions: Detox   Laboratory: Per emergency department   Psychotherapy: Attend groups   Medications: Librium detox taper, Seroquel XR 200 mg at bedtime, Wellbutrin XL 300 mg daily, Neurontin 300 mg 3 times daily, Remeron 15 mg at bedtime.   Consultations: None   Discharge Concerns: Risk for relapse   Estimated LOS: 3-5 days   Other:      I certify that inpatient services furnished can reasonably be expected to improve the patient's condition.  Latesha Chesney 10/20/2012, 1:25 PM

## 2012-10-20 NOTE — H&P (Signed)
Psychiatric Admission Assessment Adult  Patient Identification:  [name] Date of Evaluation:  10/20/2012 Chief Complaint:  Alcohol dependence History of Present Illness:: Alton is a 62 year old single, white male who presented to the emergency department with complaints of alcohol withdrawal, and requesting detox. He reports he has been drinking 12-24 years daily for the past 6 weeks. He is a patient at [facility], and reports that he is treated there for depression. He was last at [facility]. in March of 2014 for similar reasons. He reports that he has been compliant with his medications since leaving the hospital in March. He is currently denying any suicidal or homicidal ideation or auditory or visual hallucinations. Elements:  Location:  [facility] adult inpatient unit. Quality:  Affects patient's ability to maintain normal social relationships. Severity:  Drives patient to drink excessively. Timing:  Intermittent. Duration:  Many years. Context:  In all aspects. Associated Signs/Synptoms: Depression Symptoms:  Patient is currently denying any symptoms of depression (Hypo) Manic Symptoms:  Patient is currently denying any symptoms of mania Anxiety Symptoms:  Patient is currently denying any symptoms of anxiety Psychotic Symptoms:  Patient denies any symptoms of psychosis PTSD Symptoms: Patient denies a traumatic exposure  Psychiatric Specialty Exam: Physical Exam  Constitutional: He is oriented to person, place, and time. He appears well-developed and well-nourished.  HENT:  Head: Normocephalic and atraumatic.  Eyes: Conjunctivae are normal. Pupils are equal, round, and reactive to light.  Neck: Normal range of motion.  Musculoskeletal: Normal range of motion.  Neurological: He is alert and oriented to person, place, and time.    Review of Systems  Constitutional: Negative.   HENT: Negative.   Eyes: Negative.   Respiratory: Negative.   Cardiovascular: Negative.    Gastrointestinal: Negative.   Genitourinary: Negative.   Skin: Negative.   Neurological: Negative.   Endo/Heme/Allergies: Negative.   Psychiatric/Behavioral: Positive for depression and substance abuse. The patient is nervous/anxious and has insomnia.     Blood pressure 138/93, pulse 71, temperature 97.1 F (36.2 C), temperature source Oral, resp. rate 23, height 5\' 10"  (1.778 m), weight 86.637 kg (191 lb).Body mass index is 27.41 kg/(m^2).  General Appearance: Disheveled  Eye Contact::  Good  Speech:  Clear and Coherent  Volume:  Normal  Mood:  Anxious  Affect:  Congruent  Thought Process:  Tangential  Orientation:  Full (Time, Place, and Person)  Thought Content:  Rumination  Suicidal Thoughts:  No  Homicidal Thoughts:  No  Memory:  Immediate;   Good Recent;   Good Remote;   Good  Judgement:  Impaired  Insight:  Fair  Psychomotor Activity:  Normal  Concentration:  Good  Recall:  Good  Akathisia:  No  Handed:    AIMS (if indicated):     Assets:  Communication Skills Desire for Improvement Financial Resources/Insurance Housing Leisure Time Resilience Social Support  Sleep:  Number of Hours: 2.5    Past Psychiatric History: Diagnosis:  Hospitalizations:  Outpatient Care:  Substance Abuse Care:  Self-Mutilation:  Suicidal Attempts:  Violent Behaviors:   Past Medical History:   Past Medical History  Diagnosis Date  . MVP (mitral valve prolapse)   . Anxiety   . Hypertension   . Hyperlipidemia   . Palpitations   . Bipolar disorder   . Depression   . Esophagitis   . Gastritis   . Insomnia   . Seizures   . Pneumonia 2005  . Arthritis   . Hypercholesteremia   . Coronary  artery disease     a.  s/p PCI to RCA in 2007;  b.  Last LHC 04/2008:  LM with irregularities, LAD 50%, mid LAD 50%, proximal D1 80-90%, proximal OM1 30-40%, proximal OM2 50%, PLB branch 30-40%, site of prior angioplasty in the RCA widely patent, EF 65%=>Med Rx;  c. Nuclear study a 12/2011:  EF 71% and normal perfusion.    Marland Kitchen History of echocardiogram     Echo 08/16/09: Mild LVH, EF 55-60%, no normal wall motion, grade 1 diastolic dysfunction   Cardiac History:  Coronary artery disease Allergies:   Allergies  Allergen Reactions  . Ambien [Zolpidem Tartrate] Other (See Comments)    Sleep Walking  . Codeine Itching   PTA Medications: Prescriptions prior to admission  Medication Sig Dispense Refill  . aspirin EC 325 MG tablet Take 1 tablet (325 mg total) by mouth every morning. For blood thinner/heart heart  30 tablet  0  . atorvastatin (LIPITOR) 40 MG tablet Take 1 tablet (40 mg total) by mouth daily. For cholesterol  30 tablet  0  . buPROPion (WELLBUTRIN SR) 150 MG 12 hr tablet Take 300 mg by mouth at bedtime.      Marland Kitchen LORazepam (ATIVAN) 1 MG tablet Take 1 mg by mouth every 6 (six) hours as needed for anxiety.      . metoprolol succinate (TOPROL-XL) 25 MG 24 hr tablet Take 2 tablets (50 mg total) by mouth daily. For blood pressure  60 tablet  6  . nitroGLYCERIN (NITROSTAT) 0.4 MG SL tablet Place 1 tablet (0.4 mg total) under the tongue every 5 (five) minutes as needed for chest pain (CP or SOB).  15 tablet  0  . QUEtiapine (SEROQUEL XR) 50 MG TB24 24 hr tablet Take 50-200 mg by mouth 2 (two) times daily. 1 tab in am, 4 tab in pm        Previous Psychotropic Medications:  Medication/Dose                 Substance Abuse History in the last 12 months:  yes  Consequences of Substance Abuse: Withdrawal Symptoms:   Headaches Nausea Tremors  Social History:  reports that he has been smoking Cigarettes.  He has a 3.75 pack-year smoking history. He does not have any smokeless tobacco history on file. He reports that he drinks about 14.4 ounces of alcohol per week. He reports that he does not use illicit drugs. Additional Social History:       Family History:   Family History  Problem Relation Age of Onset  . Heart disease Neg Hx   . Prostate cancer Father      Results for orders placed during the hospital encounter of 10/19/12 (from the past 72 hour(s))  CBC     Status: Abnormal   Collection Time    10/19/12 12:30 PM      Result Value Range   WBC 5.4  4.0 - 10.5 K/uL   RBC 4.79  4.22 - 5.81 MIL/uL   Hemoglobin 11.9 (*) 13.0 - 17.0 g/dL   HCT 16.1 (*) 09.6 - 04.5 %   MCV 76.6 (*) 78.0 - 100.0 fL   MCH 24.8 (*) 26.0 - 34.0 pg   MCHC 32.4  30.0 - 36.0 g/dL   RDW 40.9 (*) 81.1 - 91.4 %   Platelets 265  150 - 400 K/uL  COMPREHENSIVE METABOLIC PANEL     Status: Abnormal   Collection Time    10/19/12 12:30 PM  Result Value Range   Sodium 132 (*) 135 - 145 mEq/L   Potassium 3.6  3.5 - 5.1 mEq/L   Chloride 97  96 - 112 mEq/L   CO2 20  19 - 32 mEq/L   Glucose, Bld 119 (*) 70 - 99 mg/dL   BUN 13  6 - 23 mg/dL   Creatinine, Ser 1.61  0.50 - 1.35 mg/dL   Calcium 9.9  8.4 - 09.6 mg/dL   Total Protein 7.1  6.0 - 8.3 g/dL   Albumin 3.9  3.5 - 5.2 g/dL   AST 26  0 - 37 U/L   ALT 27  0 - 53 U/L   Alkaline Phosphatase 96  39 - 117 U/L   Total Bilirubin 0.2 (*) 0.3 - 1.2 mg/dL   GFR calc non Af Amer 87 (*) >90 mL/min   GFR calc Af Amer >90  >90 mL/min   Comment: (NOTE)     The eGFR has been calculated using the CKD EPI equation.     This calculation has not been validated in all clinical situations.     eGFR's persistently <90 mL/min signify possible Chronic Kidney     Disease.  ETHANOL     Status: None   Collection Time    10/19/12 12:30 PM      Result Value Range   Alcohol, Ethyl (B) <11  0 - 11 mg/dL   Comment:            LOWEST DETECTABLE LIMIT FOR     SERUM ALCOHOL IS 11 mg/dL     FOR MEDICAL PURPOSES ONLY  URINE RAPID DRUG SCREEN (HOSP PERFORMED)     Status: None   Collection Time    10/19/12  3:11 PM      Result Value Range   Opiates NONE DETECTED  NONE DETECTED   Cocaine NONE DETECTED  NONE DETECTED   Benzodiazepines NONE DETECTED  NONE DETECTED   Amphetamines NONE DETECTED  NONE DETECTED   Tetrahydrocannabinol NONE  DETECTED  NONE DETECTED   Barbiturates NONE DETECTED  NONE DETECTED   Comment:            DRUG SCREEN FOR MEDICAL PURPOSES     ONLY.  IF CONFIRMATION IS NEEDED     FOR ANY PURPOSE, NOTIFY LAB     WITHIN 5 DAYS.                LOWEST DETECTABLE LIMITS     FOR URINE DRUG SCREEN     Drug Class       Cutoff (ng/mL)     Amphetamine      1000     Barbiturate      200     Benzodiazepine   200     Tricyclics       300     Opiates          300     Cocaine          300     THC              50   Psychological Evaluations:  Assessment:   DSM5:  Schizophrenia Disorders:   Obsessive-Compulsive Disorders:   Trauma-Stressor Disorders:   Substance/Addictive Disorders:  Alcohol Related Disorder - Severe (303.90) and Alcohol Withdrawal without Perceptual Disturbances (F10.239) Depressive Disorders:  Major Depressive Disorder - Mild (296.21)  AXIS I:  Alcohol Abuse, Major Depression, Recurrent mild and Substance Induced Mood Disorder AXIS II:  Deferred AXIS  III:   Past Medical History  Diagnosis Date  . MVP (mitral valve prolapse)   . Anxiety   . Hypertension   . Hyperlipidemia   . Palpitations   . Bipolar disorder   . Depression   . Esophagitis   . Gastritis   . Insomnia   . Seizures   . Pneumonia 2005  . Arthritis   . Hypercholesteremia   . Coronary artery disease     a.  s/p PCI to RCA in 2007;  b.  Last LHC 04/2008:  LM with irregularities, LAD 50%, mid LAD 50%, proximal D1 80-90%, proximal OM1 30-40%, proximal OM2 50%, PLB branch 30-40%, site of prior angioplasty in the RCA widely patent, EF 65%=>Med Rx;  c. Nuclear study a 12/2011: EF 71% and normal perfusion.    Marland Kitchen History of echocardiogram     Echo 08/16/09: Mild LVH, EF 55-60%, no normal wall motion, grade 1 diastolic dysfunction   AXIS IV:  housing problems, problems related to social environment and problems with primary support group AXIS V:  41-50 serious symptoms  Treatment Plan/Recommendations:  Admit to the adult  inpatient unit where he will attend groups to gain insight and improve coping mechanisms. Librium detox taper. Continue home medications except Sonata. Increase Neurontin to 300 mg 3 times daily. Try Remeron for onset of sleep.  Treatment Plan Summary: Daily contact with patient to assess and evaluate symptoms and progress in treatment Medication management Current Medications:  Current Facility-Administered Medications  Medication Dose Route Frequency Provider Last Rate Last Dose  . acetaminophen (TYLENOL) tablet 650 mg  650 mg Oral Q6H PRN Shuvon Rankin, NP      . alum & mag hydroxide-simeth (MAALOX/MYLANTA) 200-200-20 MG/5ML suspension 30 mL  30 mL Oral Q4H PRN Shuvon Rankin, NP      . buPROPion (WELLBUTRIN XL) 24 hr tablet 300 mg  300 mg Oral Daily Jorje Guild, PA-C      . chlordiazePOXIDE (LIBRIUM) capsule 25 mg  25 mg Oral QID Shuvon Rankin, NP       Followed by  . [START ON 10/21/2012] chlordiazePOXIDE (LIBRIUM) capsule 25 mg  25 mg Oral TID Shuvon Rankin, NP       Followed by  . [START ON 10/22/2012] chlordiazePOXIDE (LIBRIUM) capsule 25 mg  25 mg Oral BH-qamhs Shuvon Rankin, NP       Followed by  . [START ON 10/23/2012] chlordiazePOXIDE (LIBRIUM) capsule 25 mg  25 mg Oral Daily Shuvon Rankin, NP      . chlordiazePOXIDE (LIBRIUM) capsule 25 mg  25 mg Oral Q6H PRN Shuvon Rankin, NP      . folic acid (FOLVITE) tablet 1 mg  1 mg Oral Daily Shuvon Rankin, NP      . gabapentin (NEURONTIN) capsule 300 mg  300 mg Oral TID Jorje Guild, PA-C      . hydrOXYzine (ATARAX/VISTARIL) tablet 25 mg  25 mg Oral Q6H PRN Shuvon Rankin, NP   25 mg at 10/20/12 0315  . loperamide (IMODIUM) capsule 2-4 mg  2-4 mg Oral PRN Shuvon Rankin, NP      . magnesium hydroxide (MILK OF MAGNESIA) suspension 30 mL  30 mL Oral Daily PRN Shuvon Rankin, NP      . mirtazapine (REMERON) tablet 15 mg  15 mg Oral QHS Jorje Guild, PA-C      . multivitamin with minerals tablet 1 tablet  1 tablet Oral Daily Shuvon Rankin, NP      .  nicotine (NICODERM CQ - dosed  in mg/24 hours) patch 21 mg  21 mg Transdermal Daily Shuvon Rankin, NP      . ondansetron (ZOFRAN-ODT) disintegrating tablet 4 mg  4 mg Oral Q6H PRN Shuvon Rankin, NP      . QUEtiapine (SEROQUEL XR) 24 hr tablet 200 mg  200 mg Oral Daily Jorje Guild, PA-C      . thiamine (VITAMIN B-1) tablet 100 mg  100 mg Oral Daily Shuvon Rankin, NP       Or  . thiamine (B-1) injection 100 mg  100 mg Intravenous Daily Shuvon Rankin, NP        Observation Level/Precautions:  Detox  Laboratory:  Per emergency department  Psychotherapy:  Attend groups   Medications:  Librium detox taper, Seroquel XR 200 mg at bedtime, Wellbutrin XL 300 mg daily, Neurontin 300 mg 3 times daily, Remeron 15 mg at bedtime.   Consultations:  None   Discharge Concerns:  Risk for relapse   Estimated LOS: 3-5 days   Other:     I certify that inpatient services furnished can reasonably be expected to improve the patient's condition.   WATT,ALAN 9/28/20148:53 AM  I have examined the patient, reviewed the note and agree with the above findings and plan.  Jacqulyn Cane, M.D.  10/20/2012 2:34 PM

## 2012-10-21 DIAGNOSIS — F39 Unspecified mood [affective] disorder: Secondary | ICD-10-CM

## 2012-10-21 NOTE — Progress Notes (Signed)
D-pt reports poor sleeping, good appetite, normal energy level, c/o tremors from withdrawing A-not drink alcohol and start working out at the gym R-safety maintained on unit

## 2012-10-21 NOTE — Progress Notes (Signed)
[facility] MD Progress Note  10/21/2012 5:41 PM [name]  MRN:  [id number] Subjective:  [name] endorses several stressors in his life. He is staying in the house with a male who is eccentric and is bringing a lot of stress into his life. He is still dealing with the hip prothesis issues But he states he was coping  and one day accepted a [place]. States he does not like liquor so he thought he could handle it. Days passed and he feels it triggered something in his brain and he want back to his drinking. He was having severe withdrawal trough the night and he had to wake up and start drinking to curb the withdrawal. Diagnosis:   DSM5: Schizophrenia Disorders:   Obsessive-Compulsive Disorders:   Trauma-Stressor Disorders:   Substance/Addictive Disorders:  Alcohol Related Disorder - Severe (303.90) Depressive Disorders:  Major Depressive Disorder - Moderate (296.22)  Axis I: Mood Disorder NOS  ADL's:  Intact  Sleep: Fair  Appetite:  Fair  Suicidal Ideation:  Plan:  denies Intent:  denies Means:  denies Homicidal Ideation:  Plan:  denies Intent:  denies Means:  denies AEB (as evidenced by):  Psychiatric Specialty Exam: Review of Systems  Constitutional: Negative.   HENT: Negative.   Eyes: Negative.   Respiratory: Negative.   Cardiovascular: Negative.   Gastrointestinal: Negative.   Genitourinary: Negative.   Musculoskeletal: Positive for joint pain.  Skin: Negative.   Psychiatric/Behavioral: Positive for substance abuse. The patient is nervous/anxious.     Blood pressure 132/91, pulse 73, temperature 97.5 F (36.4 C), temperature source Oral, resp. rate 16, height 5\' 10"  (1.778 m), weight 86.637 kg (191 lb).Body mass index is 27.41 kg/(m^2).  General Appearance: Fairly Groomed  Patent attorney::  Fair  Speech:  Clear and Coherent  Volume:  Normal  Mood:  Anxious and worried  Affect:  Appropriate  Thought Process:  Coherent and Goal Directed  Orientation:  Full (Time, Place,  and Person)  Thought Content:  worries, concerns, regrets for having started drinking again, plna of finding another place to live  Suicidal Thoughts:  No  Homicidal Thoughts:  No  Memory:  Immediate;   Fair Recent;   Fair Remote;   Fair  Judgement:  Fair  Insight:  Present  Psychomotor Activity:  Restlessness  Concentration:  Fair  Recall:  Fair  Akathisia:  No  Handed:    AIMS (if indicated):     Assets:  Desire for Improvement  Sleep:  Number of Hours: 5.25   Current Medications: Current Facility-Administered Medications  Medication Dose Route Frequency Provider Last Rate Last Dose  . acetaminophen (TYLENOL) tablet 650 mg  650 mg Oral Q6H PRN Shuvon Rankin, NP   650 mg at 10/20/12 1104  . alum & mag hydroxide-simeth (MAALOX/MYLANTA) 200-200-20 MG/5ML suspension 30 mL  30 mL Oral Q4H PRN Shuvon Rankin, NP      . buPROPion (WELLBUTRIN XL) 24 hr tablet 300 mg  300 mg Oral Daily Jorje Guild, PA-C   300 mg at 10/21/12 4132  . [START ON 10/22/2012] chlordiazePOXIDE (LIBRIUM) capsule 25 mg  25 mg Oral BH-qamhs Shuvon Rankin, NP       Followed by  . [START ON 10/23/2012] chlordiazePOXIDE (LIBRIUM) capsule 25 mg  25 mg Oral Daily Shuvon Rankin, NP      . chlordiazePOXIDE (LIBRIUM) capsule 25 mg  25 mg Oral Q6H PRN Shuvon Rankin, NP   25 mg at 10/21/12 0926  . folic acid (FOLVITE) tablet 1 mg  1 mg Oral Daily Shuvon Rankin, NP   1 mg at 10/21/12 1914  . gabapentin (NEURONTIN) capsule 300 mg  300 mg Oral TID Jorje Guild, PA-C   300 mg at 10/21/12 1702  . hydrOXYzine (ATARAX/VISTARIL) tablet 25 mg  25 mg Oral Q6H PRN Shuvon Rankin, NP   25 mg at 10/20/12 0315  . loperamide (IMODIUM) capsule 2-4 mg  2-4 mg Oral PRN Shuvon Rankin, NP      . magnesium hydroxide (MILK OF MAGNESIA) suspension 30 mL  30 mL Oral Daily PRN Shuvon Rankin, NP      . mirtazapine (REMERON) tablet 15 mg  15 mg Oral QHS Jorje Guild, PA-C   15 mg at 10/20/12 2127  . multivitamin with minerals tablet 1 tablet  1 tablet Oral Daily  Shuvon Rankin, NP   1 tablet at 10/21/12 7829  . nicotine (NICODERM CQ - dosed in mg/24 hours) patch 21 mg  21 mg Transdermal Daily Shuvon Rankin, NP   21 mg at 10/21/12 0823  . ondansetron (ZOFRAN-ODT) disintegrating tablet 4 mg  4 mg Oral Q6H PRN Shuvon Rankin, NP      . QUEtiapine (SEROQUEL XR) 24 hr tablet 200 mg  200 mg Oral QHS Jorje Guild, PA-C   200 mg at 10/20/12 2127  . thiamine (VITAMIN B-1) tablet 100 mg  100 mg Oral Daily Shuvon Rankin, NP   100 mg at 10/21/12 5621   Or  . thiamine (B-1) injection 100 mg  100 mg Intravenous Daily Shuvon Rankin, NP        Lab Results: No results found for this or any previous visit (from the past 48 hour(s)).  Physical Findings: AIMS: Facial and Oral Movements Muscles of Facial Expression: None, normal Lips and Perioral Area: None, normal Jaw: None, normal Tongue: None, normal,Extremity Movements Upper (arms, wrists, hands, fingers): None, normal Lower (legs, knees, ankles, toes): None, normal, Trunk Movements Neck, shoulders, hips: None, normal, Overall Severity Severity of abnormal movements (highest score from questions above): None, normal Incapacitation due to abnormal movements: None, normal, Dental Status Current problems with teeth and/or dentures?: No Does patient usually wear dentures?: No  CIWA:  CIWA-Ar Total: 3 COWS:     Treatment Plan Summary: Daily contact with patient to assess and evaluate symptoms and progress in treatment Medication management  Plan: Supportive approach/coping skills/relapse prevention           Complete the detox           Reassess and address the comorbidities  Medical Decision Making Problem Points:  Review of psycho-social stressors (1) Data Points:  Review of medication regiment & side effects (2)  I certify that inpatient services furnished can reasonably be expected to improve the patient's condition.   Eden Rho A 10/21/2012, 5:41 PM

## 2012-10-21 NOTE — [facility] Counselor (Signed)
Adult Comprehensive Assessment  Patient ID: NOLIN GRELL, male   DOB: March 21, 1950, 62 y.o.   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 Mostly from AA. Substance abuse: 12-24 pack of beer daily for past two months. Increased alcohol consumption due to stressors-hip replacement issue/lawsuit/roomate problems.  Bereavement / Loss: none identified.   Living/Environment/Situation:  Living Arrangements: Non-relatives/Friends Living conditions (as described by patient or guardian): comfortable; "beautiful house." I live with a male friend. She is a Psychologist, clinical. She is very eccentric though. I may be moving out sometime soon." How long has patient lived in current situation?: 6 months  What is atmosphere in current home: Comfortable;Supportive;Chaotic  Family History:  Marital status: Single (I'm gay but I'm single. ) Does patient have children?: No  Childhood History:  By whom was/is the patient raised?: Both parents Additional childhood history information: Parents were married throughout my childhood. I had a beautiful, wonderful childhood. My parents divorced when I was 84. Description of patient's relationship with caregiver when they were a child: My mother was manic depressive but she was a lovely woman. She was a Sales executive. My father and I were very close too. He was a Public relations account executive.  Patient's description of current  relationship with people who raised him/her: Both parents have been deceased "for some time now." Does patient have siblings?: Yes Number of Siblings: 2 Description of patient's current relationship with siblings: My younger brother and I are very close. He lives in [place]. My older brother and I are pretty close too, but it's a different type of relationship.  Did patient suffer any verbal/emotional/physical/sexual abuse as a child?: No Did patient suffer from severe childhood neglect?: No Has patient ever been sexually abused/assaulted/raped as an adolescent or adult?: No Was the patient ever a victim of a crime or a disaster?: No Witnessed domestic violence?: No Has patient been effected by domestic violence as an adult?: No  Education:  Highest grade of school patient has completed: BS in Audiological scientist. I'm a semi-retired IT trainer.  Currently a student?: No Learning disability?: No  Employment/Work Situation:   Employment situation:  (retired/semi retired. Some Auditing work done [employer] ) Patient's job has been impacted by current illness: No What is the longest time patient has a held a job?: 19 years Where was the patient employed at that time?: IT trainer at [employer].  Has patient ever been in the [employer]?: No Has patient ever served in combat?: No  Financial Resources:   Financial resources: Medicare;Receives SSDI Does patient have a Lawyer or guardian?: No  Alcohol/Substance Abuse:   What has been your use of drugs/alcohol within the last 12 months?: 12-24 beers daily for past two months. I think the stress of having hip problems and my living situation caused the increase in drinking. no drug use identified.  If attempted suicide, did drugs/alcohol play a role in this?: No Alcohol/Substance Abuse Treatment Hx: Past Tx, Inpatient If yes, describe treatment: [facility] times total.  [facility] for 30 days 2013.  Has alcohol/substance abuse ever caused legal problems?:  No  Social Support System:   Patient's Community Support System: Good Describe Community Support System: I have a terrific support system. I have very supportive friends, including AA friends Type of faith/religion: Catholic  How does patient's faith help to cope with current illness?: Prayer/church/I feel revitalized when I talk to God.   Leisure/Recreation:   Leisure and Hobbies: Oil painting and landscaping  Strengths/Needs:   What things does the patient do well?: Friendly, outgoing, insightful, loving In what areas does patient struggle / problems for patient: mood problems; alcoholism; I have temper problems when I drink; "short fuse."  Discharge Plan:   Does patient have access to transportation?: Yes (car and license) Will patient be returning to same living situation after discharge?: Yes (home ) Currently receiving community mental health services: Yes (From Whom) [facility] for med management) If no, would patient like referral for services when discharged?: Yes (What county[place] Medical sales representative) Does patient have financial barriers related to discharge medications?: No (Medicare)  Summary/Recommendations:    Pt is 62 year old male living in [place], [place]. Pt presents to [facility] for ETOH detox and medication stabilization. Pt has history of bipolar disorder and alcohol dependence. He reports that he had been living with a male friends for the past 6 months and had begun drinking 12-24 beers daily for past two months due to increased stress relating to his living situation and a lawsuit stemming from hip surgery. Pt is retired and receives disability/medicare but does part time work as Environmental health practitioner. He plans to return home and follow up o/p with [facility] for med management. He plans to return to AA (twice weekly) and possibly group therapy at [facility]. He is not interested in getting referral for SA IOP or therapy at this time. Recommendation for pt include: therapeutic milieu, encourage group  participation and attendance, librium taper for withdrawals, medication management for mood stabilization, and development of comprehensive mental wellness/sobreity plan.   Smart, Research scientist (physical sciences). 10/21/2012

## 2012-10-21 NOTE — [facility] Group Notes (Signed)
[facility] LCSW Aftercare Discharge Planning Group Note   10/21/2012 9:14 AM  Participation Quality:  DID NOT ATTEND   Smart, Herbert Seta

## 2012-10-21 NOTE — Progress Notes (Signed)
Patient has been up and active on the unit, attended group this evening and has voiced no complaints.Patient reports detox going fine with periods of feeling anxious off and on throughout the day. Writer informed patient of prns available if needed. Patient currently denies having pain, -si/hi/a/v hall. Support and encouragement offered, safety maintained on unit, will continue to monitor.

## 2012-10-21 NOTE — [facility] Group Notes (Signed)
[facility] LCSW Group Therapy  10/21/2012 3:50 PM  Type of Therapy:  Group Therapy  Participation Level:  Active  Participation Quality:  Attentive  Affect:  Appropriate  Cognitive:  Alert and Oriented  Insight:  Engaged  Engagement in Therapy:  Engaged  Modes of Intervention:  Discussion, Education, Exploration, Socialization and Support  Summary of Progress/Problems: Today's Topic: Overcoming Obstacles. Pt identified obstacles faced currently and processed barriers involved in overcoming these obstacles. Pt identified steps necessary for overcoming these obstacles and explored motivation (internal and external) for facing these difficulties head on. Pt further identified one area of concern in their lives and chose a skill of focus pulled from their "toolbox." Tameem was attentive and engaged throughout today's therapy group. Elward identified "dealing with my roommate and her eccentricities, and making the decision to completely stop drinking" as his two main obstacles that he is currently facing. Mckennon shared with the group his difficulty in going to a grocery store or gas station, seeing alcohol, and not purchasing it. "my willpower is weak. I can drink a few beers per day for months without it getting out of hand. Then one day, its 24 beers and then here I am in detox." Jarl was encouraged to explore the lesson(s) he learned during this latest relapse. He shows progress in the group setting and improving insight AEB his ability to actively participate in the group setting and identify what he learned during this relapse. "I can't be around alcohol or even have one beer. And if I'm being honest with myself, if I stop drinking, my roommate issues will dissipate. It's my drinking and my attitude when I'm drinking that causes fights between my roommate and I."    Smart, Markitta Ausburn 10/21/2012, 3:50 PM

## 2012-10-21 NOTE — Tx Team (Signed)
Interdisciplinary Treatment Plan Update (Adult)  Date: 10/21/2012   Time Reviewed: 10:56 AM  Progress in Treatment:  Attending groups: No.  Participating in groups:  No.  Taking medication as prescribed: Yes  Tolerating medication: Yes  Family/Significant othe contact made: No. SPE not required for this pt.   Patient understands diagnosis: Yes, AEB seeking treatment for ETOH detox and depression.  Discussing patient identified problems/goals with staff: Yes  Medical problems stabilized or resolved: Yes  Denies suicidal/homicidal ideation: Yes, during admission and self report.  Patient has not harmed self or Others: Yes  New problem(s) identified: n/a  Discharge Plan or Barriers: Pt not attending d/c planning group. CSW assessing for appropriate referrals. ([place] resident[place]Medicare[place] Additional comments: [name] is a 62 year old single, white male who presented to the emergency department with complaints of alcohol withdrawal, and requesting detox. He reports he has been drinking 12-24 years daily for the past 6 weeks. He is a patient at [facility], and reports that he is treated there for depression. He was last at [facility] in March of 2014 for similar reasons. He reports that he has been compliant with his medications since leaving the hospital in March. He is currently denying any suicidal or homicidal ideation or auditory or visual hallucinations. Reason for Continuation of Hospitalization: Librium taper-withdrawals Medication management Mood stabilization Estimated length of stay: 2-3 days  For review of initial/current patient goals, please see plan of care.  Attendees:  Patient:    Family:    Physician: Geoffery Lyons MD 10/21/2012 10:55 AM   Nursing: Sue Lush RN 10/21/2012 10:55 AM   Clinical Social Worker Conley Pawling Smart, LCSWA  10/21/2012 10:55 AM   Other: Chandra Batch. PA. 10/21/2012 10:55 AM   Other:    Other: Darden Dates Nurse CM  10/21/2012 10:55 AM   Other:    Scribe for Treatment  Team:  [employer] LCSWA 10/21/2012 10:55 AM

## 2012-10-21 NOTE — [facility] Suicide Risk Assessment (Signed)
[facility] INPATIENT: Family/Significant Other Suicide Prevention Education  Suicide Prevention Education:  Education Completed; No one has been identified by the patient as the family member/significant other with whom the patient will be residing, and identified as the person(s) who will aid the patient in the event of a mental health crisis (suicidal ideations/suicide attempt).   Pt did not c/o SI at admission, nor have they endorsed SI during their stay here. SPE not required. SPI pamphlet provided to pt and he was encouraged to share information with his support network.   [employer], LCSWA 10/21/2012 11:22 AM

## 2012-10-21 NOTE — Progress Notes (Signed)
Patient ID: [name], male   DOB: [birth year], 62 y.o.   MRN: [id number] PER STATE REGULATIONS 482.30  THIS CHART WAS REVIEWED FOR MEDICAL NECESSITY WITH RESPECT TO THE PATIENT'S ADMISSION/ DURATION OF STAY.  NEXT REVIEW DATE: 10/22/2012  Willa Rough, RN, BSN CASE MANAGER

## 2012-10-22 MED ORDER — MIRTAZAPINE 30 MG PO TABS
30.0000 mg | ORAL_TABLET | Freq: Every day | ORAL | Status: DC
  Administered 2012-10-22 – 2012-10-23 (×2): 30 mg via ORAL
  Filled 2012-10-22 (×4): qty 1

## 2012-10-22 NOTE — Progress Notes (Signed)
D:  Per pt self inventory pt reports sleeping fair, appetite good, energy level normal, ability to pay attention good, rates depression at a 0 out of 10 and hopelessness at a 1 out of 10, denies SI/HI/AVH, pt c/o some tremors, not going to groups, denies pain at this time.    A:  Emotional support provided, Encouraged pt to continue with treatment plan and attend all group activities, q15 min checks maintained for safety.  R:  Pt is calm and cooperative, pleasant with staff and other pt's, states that his mood is better today than yesterday.

## 2012-10-22 NOTE — Progress Notes (Signed)
Patient ID: [name], male   DOB: 20-Jun-1950, 62 y.o.   MRN: [id number] PER STATE REGULATIONS 482.30  THIS CHART WAS REVIEWED FOR MEDICAL NECESSITY WITH RESPECT TO THE PATIENT'S ADMISSION/ DURATION OF STAY.  NEXT REVIEW DATE: 10/26/2012  Willa Rough, RN, BSN CASE MANAGER

## 2012-10-22 NOTE — [facility] Group Notes (Signed)
[facility] LCSW Group Therapy  10/22/2012 1:42 PM  Type of Therapy:  Group Therapy  Participation Level:  Active  Participation Quality:  Attentive  Affect:  Appropriate  Cognitive:  Alert and Oriented  Insight:  Engaged  Engagement in Therapy:  Engaged  Modes of Intervention:  Discussion, Education, Exploration, Socialization and Support  Summary of Progress/Problems: MHA Speaker came to talk about his personal journey with substance abuse and addiction. The pt processed ways by which to relate to the speaker. MHA speaker provided handouts and educational information pertaining to groups and services offered by the [facility]. [name] was attentive and engaged throughout today's group. He actively listened as the speaker shared his personal story with MI and SA. [name] presented with pleasant mood and calm affect.    Smart, [name] 10/22/2012, 1:42 PM

## 2012-10-22 NOTE — Progress Notes (Signed)
Pt. Was in the milieu interacting with his peers.  Pt. Did experience some mild anxiety this PM.  It was within the time range for his scheduled librium.  Writer gave the pt. His librium and offered support.    Pt. Reports that it helped, he was laughing and telling stories as he had been doing previously. NO complaints of pain or discomfort noted.  Pt. Denies SI and HI.

## 2012-10-22 NOTE — Progress Notes (Signed)
[facility] MD Progress Note  10/22/2012 5:09 PM [name]  MRN:  [id number] Subjective:  [name] continues to detox. He re affirms that it had gotten to be really bad. He had been  withdrawing trough the night and got it under control in the morning when he drank. He states he does not want to get to that point again. He had quit going to the gym, going to meetings, going to Mass. States that he knows what he needs to do to reagin sobriety but that he needs help with the medical detox Diagnosis:   DSM5: Schizophrenia Disorders:   Obsessive-Compulsive Disorders:   Trauma-Stressor Disorders:   Substance/Addictive Disorders:  Alcohol Related Disorder - Severe (303.90) Depressive Disorders:  Major Depressive Disorder - Moderate (296.22)  Axis I: Substance Induced Mood Disorder  ADL's:  Intact  Sleep: Fair  Appetite:  Fair  Suicidal Ideation:  Plan:  denies Intent:  denies Means:  denies Homicidal Ideation:  Plan:  denies Intent:  denies Means:  denies AEB (as evidenced by):  Psychiatric Specialty Exam: Review of Systems  Constitutional: Negative.   HENT: Negative.   Eyes: Negative.   Respiratory: Negative.   Cardiovascular: Negative.   Gastrointestinal: Negative.   Genitourinary: Negative.   Musculoskeletal: Positive for back pain and joint pain.  Skin: Negative.   Neurological: Negative.   Endo/Heme/Allergies: Negative.   Psychiatric/Behavioral: Positive for depression and substance abuse. The patient is nervous/anxious.     Blood pressure 145/88, pulse 92, temperature 97.4 F (36.3 C), temperature source Oral, resp. rate 20, height 5\' 10"  (1.778 m), weight 86.637 kg (191 lb).Body mass index is 27.41 kg/(m^2).  General Appearance: Fairly Groomed  Patent attorney::  Fair  Speech:  Clear and Coherent  Volume:  Normal  Mood:  Anxious  Affect:  anxious  Thought Process:  Circumstantial and Coherent  Orientation:  Full (Time, Place, and Person)  Thought Content:  worries, concerns   Suicidal Thoughts:  No  Homicidal Thoughts:  No  Memory:  Immediate;   Fair Recent;   Fair Remote;   Fair  Judgement:  Fair  Insight:  Present  Psychomotor Activity:  Restlessness  Concentration:  Fair  Recall:  Fair  Akathisia:  No  Handed:    AIMS (if indicated):     Assets:  Desire for Improvement Social Support  Sleep:  Number of Hours: 5.25   Current Medications: Current Facility-Administered Medications  Medication Dose Route Frequency Provider Last Rate Last Dose  . acetaminophen (TYLENOL) tablet 650 mg  650 mg Oral Q6H PRN Shuvon Rankin, NP   650 mg at 10/20/12 1104  . alum & mag hydroxide-simeth (MAALOX/MYLANTA) 200-200-20 MG/5ML suspension 30 mL  30 mL Oral Q4H PRN Shuvon Rankin, NP      . buPROPion (WELLBUTRIN XL) 24 hr tablet 300 mg  300 mg Oral Daily Jorje Guild, PA-C   300 mg at 10/22/12 0757  . chlordiazePOXIDE (LIBRIUM) capsule 25 mg  25 mg Oral BH-qamhs Shuvon Rankin, NP   25 mg at 10/22/12 0756   Followed by  . [START ON 10/23/2012] chlordiazePOXIDE (LIBRIUM) capsule 25 mg  25 mg Oral Daily Shuvon Rankin, NP      . chlordiazePOXIDE (LIBRIUM) capsule 25 mg  25 mg Oral Q6H PRN Shuvon Rankin, NP   25 mg at 10/21/12 0926  . folic acid (FOLVITE) tablet 1 mg  1 mg Oral Daily Shuvon Rankin, NP   1 mg at 10/22/12 0757  . gabapentin (NEURONTIN) capsule 300 mg  300  mg Oral TID Jorje Guild, PA-C   300 mg at 10/22/12 1117  . hydrOXYzine (ATARAX/VISTARIL) tablet 25 mg  25 mg Oral Q6H PRN Shuvon Rankin, NP   25 mg at 10/20/12 0315  . loperamide (IMODIUM) capsule 2-4 mg  2-4 mg Oral PRN Shuvon Rankin, NP      . magnesium hydroxide (MILK OF MAGNESIA) suspension 30 mL  30 mL Oral Daily PRN Shuvon Rankin, NP      . mirtazapine (REMERON) tablet 30 mg  30 mg Oral QHS Rachael Fee, MD      . multivitamin with minerals tablet 1 tablet  1 tablet Oral Daily Shuvon Rankin, NP   1 tablet at 10/22/12 0757  . nicotine (NICODERM CQ - dosed in mg/24 hours) patch 21 mg  21 mg Transdermal Daily  Shuvon Rankin, NP   21 mg at 10/22/12 0757  . ondansetron (ZOFRAN-ODT) disintegrating tablet 4 mg  4 mg Oral Q6H PRN Shuvon Rankin, NP      . QUEtiapine (SEROQUEL XR) 24 hr tablet 200 mg  200 mg Oral QHS Jorje Guild, PA-C   200 mg at 10/21/12 2149  . thiamine (VITAMIN B-1) tablet 100 mg  100 mg Oral Daily Shuvon Rankin, NP   100 mg at 10/22/12 0757   Or  . thiamine (B-1) injection 100 mg  100 mg Intravenous Daily Shuvon Rankin, NP        Lab Results: No results found for this or any previous visit (from the past 48 hour(s)).  Physical Findings: AIMS: Facial and Oral Movements Muscles of Facial Expression: None, normal Lips and Perioral Area: None, normal Jaw: None, normal Tongue: None, normal,Extremity Movements Upper (arms, wrists, hands, fingers): None, normal Lower (legs, knees, ankles, toes): None, normal, Trunk Movements Neck, shoulders, hips: None, normal, Overall Severity Severity of abnormal movements (highest score from questions above): None, normal Incapacitation due to abnormal movements: None, normal Patient's awareness of abnormal movements (rate only patient's report): No Awareness, Dental Status Current problems with teeth and/or dentures?: No Does patient usually wear dentures?: No  CIWA:  CIWA-Ar Total: 3 COWS:     Treatment Plan Summary: Daily contact with patient to assess and evaluate symptoms and progress in treatment Medication management  Plan: Supportive approach/coping skills/relapse prevention           Complete the detox  Medical Decision Making Problem Points:  Review of psycho-social stressors (1) Data Points:  Review of medication regiment & side effects (2)  I certify that inpatient services furnished can reasonably be expected to improve the patient's condition.   [name] A 10/22/2012, 5:09 PM

## 2012-10-22 NOTE — Progress Notes (Signed)
The focus of this group is to educate the patient on the purpose and policies of crisis stabilization and provide a format to answer questions about their admission.  The group details unit policies and expectations of patients while admitted. Patient did not attend the morning group. 

## 2012-10-22 NOTE — Progress Notes (Signed)
Recreation Therapy Notes  Date: 09.30.2014 Time: 3:00pm Location: [address] Dayroom  Group Topic: Communication  Goal Area(s) Addresses:  Patient will effectively communicate verbally with group members. Patient will effectively communicate non-verbally with group members.  Patient will identify benefit of healthy communication.   Behavioral Response: Did not attend  [name], LRT/CTRS  [name] 10/22/2012 4:59 PM

## 2012-10-22 NOTE — Progress Notes (Signed)
Adult Psychoeducational Group Note  Date:  10/22/2012 Time:  11:42 PM  Group Topic/Focus:  Wrap-Up Group:   The focus of this group is to help patients review their daily goal of treatment and discuss progress on daily workbooks.  Participation Level:  Active  Participation Quality:  Appropriate and Sharing  Affect:  Appropriate  Cognitive:  Appropriate  Insight: Appropriate  Engagement in Group:  Engaged  Modes of Intervention:  Discussion, Education, Socialization and Support  Additional Comments:  Pt stated that he was in the hospital for alcohol. Pt informed the group that he is friendly and helpful to others. Pt stated that he enjoys oil painting.   Laural Benes, Henry Demeritt 10/22/2012, 11:42 PM

## 2012-10-22 NOTE — Progress Notes (Signed)
Pt. Denies SI and HI at this time.   Pt had no complaints of pain or discomfort.  Pt. States he has had a lot of stressors in his life recently.  Part from his landlady complaining about some of the things he does and part from a law suit for his metal hip implant.  Pt. States he has actually been drinking 12 beers a day but they are ice beers that are equivalent to 24 regular beers.  Pt. Has a history of anxiety and depression.  Pt. Knows he needs to get off the alcohol completely.  Pt. States his Father use to keep him straight because he would call him and tell him when he had had enough to drink but his Father has passed and he started drinking more.  Pt. Has been sober for 3 yrs., and thought he could handle one drink.  Pt. Now realizes that he cannot drink at all and is going to find a new place to live at discharge from [facility].  Pt. States his Mother OD'd and he has a Brother that he has not been able to reach in 55yrs. That has a diagnosis of bipolar D/O.  The pt. Does not want to follow this path and states he knows what he needs to do.

## 2012-10-22 NOTE — Progress Notes (Signed)
Recreation Therapy Notes  Date: 09.30.2014 Time: 2:30pm Location: [address] Dayroom  Group Topic: Software engineer Activities (AAA)  Behavioral Response: Did not attend  [employer], LRT/CTRS  Alexie Samson L 10/22/2012 4:03 PM

## 2012-10-23 DIAGNOSIS — F411 Generalized anxiety disorder: Secondary | ICD-10-CM

## 2012-10-23 MED ORDER — CLONIDINE HCL 0.1 MG PO TABS
0.1000 mg | ORAL_TABLET | Freq: Two times a day (BID) | ORAL | Status: DC
  Administered 2012-10-23 – 2012-10-24 (×2): 0.1 mg via ORAL
  Filled 2012-10-23 (×6): qty 1

## 2012-10-23 MED ORDER — HYDROXYZINE HCL 25 MG PO TABS: 25.0000 mg | ORAL_TABLET | Freq: Four times a day (QID) | ORAL | Status: DC | PRN

## 2012-10-23 MED ORDER — HYDROXYZINE HCL 25 MG PO TABS
25.0000 mg | ORAL_TABLET | Freq: Four times a day (QID) | ORAL | Status: DC | PRN
Start: 2012-10-23 — End: 2012-10-24
  Administered 2012-10-24: 25 mg via ORAL
  Filled 2012-10-23: qty 16
  Filled 2012-10-23: qty 1
  Filled 2012-10-23: qty 24

## 2012-10-23 NOTE — Progress Notes (Signed)
Patient ID: [name], male   DOB: [birth year], 62 y.o.   MRN: [id number] D: pt. Visible on the unit in dayroom watching TV and interacting. Pt. Reports "I'm going home tomorrow" Pt. Notes that he's been a patient here before and being here is always been helpful. "the staff is wonderful and Dr. Dub Mikes is the tops" "I'm getting out of a bad living situation and that's helped a lot" A: Writer introduced self to client and encouraged him to remain active in his recovery and attend AA meeting, refraining from old friends. Writer encouraged group. Staff will monitor q71min for safety. R: Pt. Is safe on the unit and attended group.

## 2012-10-23 NOTE — BHH Group Notes (Signed)
[facility] LCSW Group Therapy  10/23/2012 2:52 PM  Type of Therapy:  Group Therapy  Participation Level:  Did Not Attend  Smart, Rivaan Kendall 10/23/2012, 2:52 PM

## 2012-10-23 NOTE — BHH Group Notes (Signed)
[facility] LCSW Aftercare Discharge Planning Group Note   10/23/2012 10:36 AM  Participation Quality:  DID NOT ATTEND   Smart, Herbert Seta

## 2012-10-23 NOTE — Progress Notes (Signed)
Pt at times appears somewhat anxious. He did go outdoors with the other pts. Pt is requesting visteral for when he awakens in the night to help him go back to sleep. Pt stated he has been here many times before. He does contract for safety. No Si or HI and does contract for safety.

## 2012-10-23 NOTE — Progress Notes (Signed)
[facility] MD Progress Note  10/23/2012 5:22 PM [name]  MRN:  [id number] Subjective:  Sleep is still an issue. Woke up at 3 AM. Otherwise he states he is starting to feel better. He has found another place to live and feels this is a better situation, less stressful for him. He is going to continue to work on his recovery. Will be going back to the GYM, AA, and Mass. He is awaiting the settlement money for the hip prothesis dysfunction. Diagnosis:   DSM5: Schizophrenia Disorders:   Obsessive-Compulsive Disorders:   Trauma-Stressor Disorders:   Substance/Addictive Disorders:  Alcohol Related Disorder - Severe (303.90) Depressive Disorders:  Major Depressive Disorder - Mild (296.21)  Axis I: Anxiety Disorder NOS and Mood Disorder NOS  ADL's:  Intact  Sleep: Poor  Appetite:  Fair  Suicidal Ideation:  Plan:  denies Intent:  denies Means:  denies Homicidal Ideation:  Plan:  denies Intent:  denies Means:  denies AEB (as evidenced by):  Psychiatric Specialty Exam: Review of Systems  Constitutional: Negative.   HENT: Negative.   Eyes: Negative.   Respiratory: Negative.   Cardiovascular: Negative.   Gastrointestinal: Negative.   Genitourinary: Negative.   Musculoskeletal: Positive for back pain and joint pain.  Skin: Negative.   Neurological: Negative.   Endo/Heme/Allergies: Negative.   Psychiatric/Behavioral: Positive for substance abuse. The patient is nervous/anxious and has insomnia.     Blood pressure 137/96, pulse 101, temperature 97.5 F (36.4 C), temperature source Oral, resp. rate 16, height 5\' 10"  (1.778 m), weight 86.637 kg (191 lb).Body mass index is 27.41 kg/(m^2).  General Appearance: Fairly Groomed  Patent attorney::  Fair  Speech:  Clear and Coherent  Volume:  Normal  Mood:  Anxious  Affect:  Appropriate  Thought Process:  Coherent and Goal Directed  Orientation:  Full (Time, Place, and Person)  Thought Content:  worries, concerns, symptoms  Suicidal Thoughts:   No  Homicidal Thoughts:  No  Memory:  Immediate;   Fair Recent;   Fair Remote;   Fair  Judgement:  Fair  Insight:  Present  Psychomotor Activity:  Restlessness  Concentration:  Fair  Recall:  Fair  Akathisia:  No  Handed:    AIMS (if indicated):     Assets:  Desire for Improvement Financial Resources/Insurance Housing Social Support  Sleep:  Number of Hours: 3.75   Current Medications: Current Facility-Administered Medications  Medication Dose Route Frequency Provider Last Rate Last Dose  . acetaminophen (TYLENOL) tablet 650 mg  650 mg Oral Q6H PRN Shuvon Rankin, NP   650 mg at 10/23/12 1031  . alum & mag hydroxide-simeth (MAALOX/MYLANTA) 200-200-20 MG/5ML suspension 30 mL  30 mL Oral Q4H PRN Shuvon Rankin, NP      . buPROPion (WELLBUTRIN XL) 24 hr tablet 300 mg  300 mg Oral Daily Jorje Guild, PA-C   300 mg at 10/23/12 0752  . folic acid (FOLVITE) tablet 1 mg  1 mg Oral Daily Shuvon Rankin, NP   1 mg at 10/23/12 0752  . gabapentin (NEURONTIN) capsule 300 mg  300 mg Oral TID Jorje Guild, PA-C   300 mg at 10/23/12 1652  . hydrOXYzine (ATARAX/VISTARIL) tablet 25 mg  25 mg Oral Q6H PRN Sanjuana Kava, NP      . magnesium hydroxide (MILK OF MAGNESIA) suspension 30 mL  30 mL Oral Daily PRN Shuvon Rankin, NP      . mirtazapine (REMERON) tablet 30 mg  30 mg Oral QHS Rachael Fee, MD  30 mg at 10/22/12 2228  . multivitamin with minerals tablet 1 tablet  1 tablet Oral Daily Shuvon Rankin, NP   1 tablet at 10/23/12 0752  . nicotine (NICODERM CQ - dosed in mg/24 hours) patch 21 mg  21 mg Transdermal Daily Shuvon Rankin, NP   21 mg at 10/23/12 0751  . QUEtiapine (SEROQUEL XR) 24 hr tablet 200 mg  200 mg Oral QHS Jorje Guild, PA-C   200 mg at 10/22/12 2229  . thiamine (VITAMIN B-1) tablet 100 mg  100 mg Oral Daily Shuvon Rankin, NP   100 mg at 10/23/12 4540   Or  . thiamine (B-1) injection 100 mg  100 mg Intravenous Daily Shuvon Rankin, NP        Lab Results: No results found for this or any  previous visit (from the past 48 hour(s)).  Physical Findings: AIMS: Facial and Oral Movements Muscles of Facial Expression: None, normal Lips and Perioral Area: None, normal Jaw: None, normal Tongue: None, normal,Extremity Movements Upper (arms, wrists, hands, fingers): None, normal Lower (legs, knees, ankles, toes): None, normal, Trunk Movements Neck, shoulders, hips: None, normal, Overall Severity Severity of abnormal movements (highest score from questions above): None, normal Incapacitation due to abnormal movements: None, normal Patient's awareness of abnormal movements (rate only patient's report): No Awareness, Dental Status Current problems with teeth and/or dentures?: No Does patient usually wear dentures?: No  CIWA:  CIWA-Ar Total: 2 COWS:     Treatment Plan Summary: Daily contact with patient to assess and evaluate symptoms and progress in treatment Medication management  Plan: Supportive approach/coping skills/relapse prevention           Will resume the vistaril PRN that he was taking at night if he was to wake up  Medical Decision Making Problem Points:  Review of psycho-social stressors (1) Data Points:  Review of medication regiment & side effects (2) Review of new medications or change in dosage (2)  I certify that inpatient services furnished can reasonably be expected to improve the patient's condition.   Keishia Ground A 10/23/2012, 5:22 PM

## 2012-10-23 NOTE — Progress Notes (Signed)
Pt attended Group

## 2012-10-23 NOTE — Progress Notes (Signed)
Patient ID: [name], male   DOB: 1950/07/23, 62 y.o.   MRN: [id number] He has been up and to groups interacting with peers and staff.  Self inventory: depression 0, hopelessness 10, denies w/d symptoms and SI.

## 2012-10-24 DIAGNOSIS — F3130 Bipolar disorder, current episode depressed, mild or moderate severity, unspecified: Secondary | ICD-10-CM

## 2012-10-24 DIAGNOSIS — F1020 Alcohol dependence, uncomplicated: Principal | ICD-10-CM

## 2012-10-24 MED ORDER — BUPROPION HCL ER (XL) 300 MG PO TB24: 300.0000 mg | ORAL_TABLET | Freq: Every day | ORAL | Status: AC

## 2012-10-24 MED ORDER — NITROGLYCERIN 0.4 MG SL SUBL
0.4000 mg | SUBLINGUAL_TABLET | SUBLINGUAL | Status: AC | PRN
Start: 2012-10-24 — End: ?

## 2012-10-24 MED ORDER — ATORVASTATIN CALCIUM 40 MG PO TABS: 40.0000 mg | ORAL_TABLET | Freq: Every day | ORAL | Status: AC

## 2012-10-24 MED ORDER — QUETIAPINE FUMARATE ER 200 MG PO TB24: 200.0000 mg | ORAL_TABLET | Freq: Every day | ORAL | Status: DC

## 2012-10-24 MED ORDER — CLONIDINE HCL 0.1 MG PO TABS: 0.1000 mg | ORAL_TABLET | Freq: Two times a day (BID) | ORAL | Status: DC

## 2012-10-24 MED ORDER — MIRTAZAPINE 30 MG PO TABS: 30.0000 mg | ORAL_TABLET | Freq: Every day | ORAL | Status: DC

## 2012-10-24 MED ORDER — ASPIRIN EC 325 MG PO TBEC: 325.0000 mg | DELAYED_RELEASE_TABLET | Freq: Every morning | ORAL | Status: DC

## 2012-10-24 MED ORDER — HYDROXYZINE HCL 25 MG PO TABS: 25.0000 mg | ORAL_TABLET | Freq: Four times a day (QID) | ORAL | Status: DC | PRN

## 2012-10-24 MED ORDER — GABAPENTIN 300 MG PO CAPS: 300.0000 mg | ORAL_CAPSULE | Freq: Three times a day (TID) | ORAL | Status: DC

## 2012-10-24 NOTE — Discharge Summary (Signed)
Physician Discharge Summary Note  Patient:  [name] is an 62 y.o., male MRN:  [id number] DOB:  Sep 21, 1950 Patient phone:  [phone] (home)  Patient address:   [address] [place] [address] [place] [address],   Date of Admission:  10/19/2012 Date of Discharge: 10/24/12  Reason for Admission:  Alcohol detox  Discharge Diagnoses: Active Problems:   * No active hospital problems. *  Review of Systems  Constitutional: Negative.   HENT: Negative.   Eyes: Negative.   Respiratory: Negative.   Cardiovascular: Negative.   Gastrointestinal: Negative.   Genitourinary: Negative.   Musculoskeletal: Negative.   Skin: Negative.   Neurological: Negative.   Endo/Heme/Allergies: Negative.   Psychiatric/Behavioral: Positive for depression (Satbilized with medication prior to discharge) and substance abuse (Polysubstance dependence). Negative for suicidal ideas, hallucinations and memory loss. The patient is nervous/anxious (Stabilized with medication prior to discharge) and has insomnia (Stabilized with medication prior to discharge).     DSM5: Schizophrenia Disorders:  NA Obsessive-Compulsive Disorders:  NA Trauma-Stressor Disorders:  NA Substance/Addictive Disorders:  Alcohol dependence Depressive Disorders:  Bipolar affective disorder, depressed  Axis Diagnosis:  AXIS I:  Alcohol dependence, Bipolar affective disorder, depressed AXIS II:  Deferred AXIS III:   Past Medical History  Diagnosis Date  . MVP (mitral valve prolapse)   . Anxiety   . Hypertension   . Hyperlipidemia   . Palpitations   . Bipolar disorder   . Depression   . Esophagitis   . Gastritis   . Insomnia   . Seizures   . Pneumonia 2005  . Arthritis   . Hypercholesteremia   . Coronary artery disease     a.  s/p PCI to RCA in 2007;  b.  Last LHC 04/2008:  LM with irregularities, LAD 50%, mid LAD 50%, proximal D1 80-90%, proximal OM1 30-40%, proximal OM2 50%, PLB branch 30-40%, site of prior angioplasty  in the RCA widely patent, EF 65%=>Med Rx;  c. Nuclear study a 12/2011: EF 71% and normal perfusion.    Marland Kitchen History of echocardiogram     Echo 08/16/09: Mild LVH, EF 55-60%, no normal wall motion, grade 1 diastolic dysfunction   AXIS IV:  other psychosocial or environmental problems and Chronic alcoholism, chronic mental illness AXIS V:  63  Level of Care:  OP  Hospital Course:  [name] is a 62 year old single, white male who presented to the emergency department with complaints of alcohol withdrawal, and requesting detox. He reports he has been drinking 12-24 years daily for the past 6 weeks. He is a patient at [facility], and reports that he is treated there for depression. He was last at [facility]. in March of 2014 for similar reasons. He reports that he has been compliant with his medications since leaving the hospital in March. He is currently denying any suicidal or homicidal ideation or auditory or visual hallucinations.  After admission assessment/evaluation, it was determined that [name] will need detoxification treatment protocol to re-stabilize his systems of alcohol intoxications. Based on his reports upon admission, he was drinking 12-24 bottles of beer daily. Although his toxicology reports indicated blood alcohol levels of <11, he did receive Librium detoxification treatment protocols to combat any withdrawal symptoms of alcohol that he may present. He was also enrolled in group counseling sessions/activities where he was counseled, taught and learned coping skills that should help him cope better after discharge and manage his substance dependence issues for a much longer sobriety. [name] also participated in the  AA/NA meetings being offered and held on this unit. He also received medication management and monitoring his other previously existing medical conditions. He tolerated his treatment regimen without any significant adverse effects and or reactions presented.  Patient attended treatment  team meeting this am and met with treatment team staff. His reason for admission, present symptoms, treatment plan, response to treatment plans and discharge plans left. [name] endorsed that he is doing well and stable enough to pursue psychiatric treatment on an outpatient basis. It was then agreed upon that he will follow-up care at the [facility] psychiatric clinic here in [place], [place] between the hours of 08-09:00 am. He has been informed that this is a walk-in appointment. The address, date, time for this appointment and contact information for the [facility] clinic provided for patient in writing.  Upon discharge, patient adamantly denies any suicidal, homicidal ideations, delusions, paranoia, auditory, visual hallucinations and or withdrawal symptoms. He was provided with 4 days worth supply samples of his [facility] d/c medications. He left [facility] with all personal belongings in no apparent distress. transportation per friend.  Consults:  psychiatry  Significant Diagnostic Studies:  labs: CBC with diff, CMP, UDS, Toxicology tests, U/A  Discharge Vitals:   Blood pressure 127/90, pulse 97, temperature 97.6 F (36.4 C), temperature source Oral, resp. rate 20, height 5\' 10"  (1.778 m), weight 86.637 kg (191 lb). Body mass index is 27.41 kg/(m^2). Lab Results:   No results found for this or any previous visit (from the past 72 hour(s)).  Physical Findings: AIMS: Facial and Oral Movements Muscles of Facial Expression: None, normal Lips and Perioral Area: None, normal Jaw: None, normal Tongue: None, normal,Extremity Movements Upper (arms, wrists, hands, fingers): None, normal Lower (legs, knees, ankles, toes): None, normal, Trunk Movements Neck, shoulders, hips: None, normal, Overall Severity Severity of abnormal movements (highest score from questions above): None, normal Incapacitation due to abnormal movements: None, normal Patient's awareness of abnormal movements (rate only patient's report): No  Awareness, Dental Status Current problems with teeth and/or dentures?: No Does patient usually wear dentures?: No  CIWA:  CIWA-Ar Total: 1 COWS:     Psychiatric Specialty Exam: See Psychiatric Specialty Exam and Suicide Risk Assessment completed by Attending Physician prior to discharge.  Discharge destination:  Home  Is patient on multiple antipsychotic therapies at discharge:  No   Has Patient had three or more failed trials of antipsychotic monotherapy by history:  No  Recommended Plan for Multiple Antipsychotic Therapies: NA       Future Appointments Provider Department Dept Phone   01/02/2013 2:45 PM Peter M Swaziland, MD [facility] [place] Office [phone]       Medication List    STOP taking these medications       buPROPion 150 MG 12 hr tablet  Commonly known as:  WELLBUTRIN SR  Replaced by:  buPROPion 300 MG 24 hr tablet     LORazepam 1 MG tablet  Commonly known as:  ATIVAN     metoprolol succinate 25 MG 24 hr tablet  Commonly known as:  TOPROL-XL      TAKE these medications     Indication   aspirin EC 325 MG tablet  Take 1 tablet (325 mg total) by mouth every morning. For blood thinner for heart health   Indication:  Blood thinner for heart heart     atorvastatin 40 MG tablet  Commonly known as:  LIPITOR  Take 1 tablet (40 mg total) by mouth daily. For cholesterol  Indication:  Inherited Heterozygous Hypercholesterolemia, Inherited Homozygous Hypercholesterolemia     buPROPion 300 MG 24 hr tablet  Commonly known as:  WELLBUTRIN XL  Take 1 tablet (300 mg total) by mouth daily. For depression   Indication:  Major Depressive Disorder     cloNIDine 0.1 MG tablet  Commonly known as:  CATAPRES  Take 1 tablet (0.1 mg total) by mouth 2 (two) times daily. For high blood pressure control   Indication:  High Blood Pressure     gabapentin 300 MG capsule  Commonly known as:  NEURONTIN  Take 1 capsule (300 mg total) by mouth 3 (three) times daily. For  anxiety/pain management   Indication:  Pain, Anxiety     hydrOXYzine 25 MG tablet  Commonly known as:  ATARAX/VISTARIL  Take 1 tablet (25 mg total) by mouth every 6 (six) hours as needed for anxiety.   Indication:  Anxiety associated with Organic Disease     mirtazapine 30 MG tablet  Commonly known as:  REMERON  Take 1 tablet (30 mg total) by mouth at bedtime. For depression/sleep   Indication:  Trouble Sleeping, Major Depressive Disorder     nitroGLYCERIN 0.4 MG SL tablet  Commonly known as:  NITROSTAT  Place 1 tablet (0.4 mg total) under the tongue every 5 (five) minutes as needed for chest pain (CP or SOB).   Indication:  Acute Angina Pectoris     QUEtiapine 200 MG 24 hr tablet  Commonly known as:  SEROQUEL XR  Take 1 tablet (200 mg total) by mouth at bedtime. For mood control   Indication:  Mood control       Follow-up Information   Follow up with [facility]. (Walk in between 8am-9am for hospital followup/medication management. )    Contact information:   [address], [place] [address] Phone: [phone]    Follow-up recommendations: Activity:  As tolerated Diet: As recommended by your primary care doctor. Keep all scheduled follow-up appointments as recommended.  Continue to work your relapse prevention plan Comments: Take all your medications as prescribed by your mental healthcare provider. Report any adverse effects and or reactions from your medicines to your outpatient provider promptly. Patient is instructed and cautioned to not engage in alcohol and or illegal drug use while on prescription medicines. In the event of worsening symptoms, patient is instructed to call the crisis hotline, 911 and or go to the nearest ED for appropriate evaluation and treatment of symptoms. Follow-up with your primary care provider for your other medical issues, concerns and or health care needs.   Total Discharge Time:  Greater than 30 minutes.  Signed: Sanjuana Kava,  PMHNP-BC 10/25/2012, 2:16 PM Agree with assessment and plan Madie Reno A. Dub Mikes, M.D.

## 2012-10-24 NOTE — Progress Notes (Signed)
Recreation Therapy Notes  Date: 10.02.2014 Time: 2:30.2014 Location: 300 Hall Dayroom  Group Topic: Software engineer Activities (AAA)  Behavioral Response: Engaged, Attentive, Appropriate  Affect: Euthymic  Clinical Observations/Feedback: Dog Team: [employer] Patient interacted appropriately with peer, dog team, LRT and MHT.   [name], LRT/CTRS  [name] 10/24/2012 5:21 PM

## 2012-10-24 NOTE — Progress Notes (Signed)
Patient ID: [name], male   DOB: 1950-09-15, 62 y.o.   MRN: [id number] Pt did not attend 11am psychoeducational group, he was in bed during group.

## 2012-10-24 NOTE — [facility] Suicide Risk Assessment (Signed)
Suicide Risk Assessment  Discharge Assessment     Demographic Factors:  Male and Caucasian  Mental Status Per Nursing Assessment::   On Admission:  NA  Current Mental Status by Physician: In full contact with reality. There are no suicidal ideas, plans or intent. His mood is euthymic, his affect is appropriate. He is not exhibiting any S/S of withdrawal. He is planning to get back to AA, the Gym, [place].   Loss Factors: Decline in physical health  Historical Factors: NA  Risk Reduction Factors:   Positive social support  Continued Clinical Symptoms:  Depression:   Comorbid alcohol abuse/dependence Alcohol/Substance Abuse/Dependencies  Cognitive Features That Contribute To Risk:  Polarized thinking Thought constriction (tunnel vision)    Suicide Risk:  Minimal: No identifiable suicidal ideation.  Patients presenting with no risk factors but with morbid ruminations; may be classified as minimal risk based on the severity of the depressive symptoms  Discharge Diagnoses:   AXIS I:  Alcohol Dependence, Depressive Disorder NOS AXIS II:  Deferred AXIS III:   Past Medical History  Diagnosis Date  . MVP (mitral valve prolapse)   . Anxiety   . Hypertension   . Hyperlipidemia   . Palpitations   . Bipolar disorder   . Depression   . Esophagitis   . Gastritis   . Insomnia   . Seizures   . Pneumonia 2005  . Arthritis   . Hypercholesteremia   . Coronary artery disease     a.  s/p PCI to RCA in 2007;  b.  Last LHC 04/2008:  LM with irregularities, LAD 50%, mid LAD 50%, proximal D1 80-90%, proximal OM1 30-40%, proximal OM2 50%, PLB branch 30-40%, site of prior angioplasty in the RCA widely patent, EF 65%=>Med Rx;  c. Nuclear study a 12/2011: EF 71% and normal perfusion.    Marland Kitchen History of echocardiogram     Echo 08/16/09: Mild LVH, EF 55-60%, no normal wall motion, grade 1 diastolic dysfunction   AXIS IV:  other psychosocial or environmental problems AXIS V:  61-70 mild  symptoms  Plan Of Care/Follow-up recommendations:  Activity:  as tolerated Diet:  regular Follow up outpatient basis/AA Is patient on multiple antipsychotic therapies at discharge:  No   Has Patient had three or more failed trials of antipsychotic monotherapy by history:  No  Recommended Plan for Multiple Antipsychotic Therapies: NA  Latyra Jaye A 10/24/2012, 12:05 PM

## 2012-10-24 NOTE — Progress Notes (Signed)
Patient ID: [name], male   DOB: [birth year], 62 y.o.   MRN: [id number] Nursing d/c note- Patient is cooperative during d/c process.  Denies SI or s/s of etoh withdrawal.  States treatment goals were met.  Verbalized understanding of all d/c instructions.  All belongings returned and escorted to lobby to care of friend.

## 2012-10-24 NOTE — [facility] Group Notes (Signed)
[facility] LCSW Group Therapy  10/24/2012  1:15 PM   Type of Therapy:  Group Therapy  Participation Level:  Active  Participation Quality:  Appropriate and Attentive  Affect:  Appropriate, Calm  Cognitive:  Alert and Appropriate  Insight:  Developing/Improving and Engaged  Engagement in Therapy:  Developing/Improving and Engaged  Modes of Intervention:  Clarification, Confrontation, Discussion, Education, Exploration, Limit-setting, Orientation, Problem-solving, Rapport Building, Dance movement psychotherapist, Socialization and Support  Summary of Progress/Problems: The topic for group was balance in life.  Today's group focused on defining balance in one's own words, identifying things that can knock one off balance, and exploring healthy ways to maintain balance in life. Group members were asked to provide an example of a time when they felt off balance, describe how they handled that situation,and process healthier ways to regain balance in the future. Group members were asked to share the most important tool for maintaining balance that they learned while at [facility] and how they plan to apply this method after discharge.  Pt shared that his life feels unbalance when dealing with his physical disabilities.  Pt left group and didn't return after sharing.    Reyes Ivan, LCSWA 10/24/2012 3:05 PM

## 2012-10-25 NOTE — Clinical Social Work Note (Signed)
Pt was not scheduled for d/c on 10/2. He d/ced Thursday evening and plans to follow up at [facility] for med management and assessment for therapy services. His friend transported him home at d/c.

## 2012-10-25 NOTE — Progress Notes (Signed)
Recreation Therapy Notes  Date: 10.02.2014 Time: 3:00pm Location: 300 Hall Dayroom   Group Topic: Communication, Team Building, Problem Solving  Goal Area(s) Addresses:  Patient will effectively work with peers towards shared goal.  Patient will identify skill used to make activity successful.  Patient will identify how skills used during activity can be used to reach post d/c goals.   Behavioral Response:  Appropriate  Intervention: Problem Solving Activity  Activity: Landing Pad. In teams patients were given 12 plastic drinking straws and a length of masking tape. Using the materials provided patients were asked to build a landing pad to catch a golf ball dropped from approximately 6 feet in the air.   Education: Special educational needs teacher, Team Work, Building control surveyor.   Education Outcome: Needs additional education.   Clinical Observations/Feedback: Patient attended group, but did not participate in group activity. Patient left group at approximately 3:10pm, stating he need to use the restroom, patient returned at approximately 3:25pm.   [name], LRT/CTRS  [name] 10/25/2012 8:42 AM

## 2012-10-25 NOTE — Progress Notes (Signed)
[facility] Adult Case Management Discharge Plan :  Will you be returning to the same living situation after discharge: Yes,  home with friend At discharge, do you have transportation home?:Yes,  friend Do you have the ability to pay for your medications:Yes,  Medicare  Release of information consent forms completed and in the chart;  [name].  Patient to Follow up at: [facility] [address], [place]  Walk in Between 8am-9am Monday through Friday for hospital followup and medication management/assessment for therapy services.    Patient denies SI/HI:   Yes,  during admission, self report, and group.    Safety Planning and Suicide Prevention discussed:  Yes,  SPE not required for this pt. SPI pamphlet provided to pt and he was encouraged to share information with support network.   Smart, Noretta Frier 10/25/2012, 9:23 AM

## 2012-10-29 NOTE — Progress Notes (Signed)
Patient Discharge Instructions:  After Visit Summary (AVS):   Faxed to:  10/29/12 Discharge Summary Note:   Faxed to:  10/29/12 Psychiatric Admission Assessment Note:   Faxed to:  10/29/12 Suicide Risk Assessment - Discharge Assessment:   Faxed to:  10/29/12 Faxed/Sent to the Next Level Care provider:  10/29/12 Faxed to [facility] @ [phone]  Jerelene Redden, 10/29/2012, 2:05 PM

## 2013-01-02 ENCOUNTER — Ambulatory Visit: Payer: Self-pay | Admitting: Cardiology

## 2013-01-07 ENCOUNTER — Encounter: Payer: Self-pay | Admitting: Physician Assistant

## 2013-01-07 ENCOUNTER — Ambulatory Visit (INDEPENDENT_AMBULATORY_CARE_PROVIDER_SITE_OTHER): Payer: Medicare Other | Admitting: Physician Assistant

## 2013-01-07 VITALS — BP 126/78 | HR 84 | Ht 70.0 in

## 2013-01-07 DIAGNOSIS — I2510 Atherosclerotic heart disease of native coronary artery without angina pectoris: Secondary | ICD-10-CM

## 2013-01-07 DIAGNOSIS — E785 Hyperlipidemia, unspecified: Secondary | ICD-10-CM

## 2013-01-07 DIAGNOSIS — I10 Essential (primary) hypertension: Secondary | ICD-10-CM

## 2013-01-07 NOTE — Progress Notes (Signed)
[address] [place], [place]  [address] Phone: [phone] Fax:  [phone]  Date:  01/07/2013   ID:  [name], DOB 01/23/1951, MRN [id number]  PCP:  Provider Not In System  Cardiologist:  Dr. Peter Swaziland    History of Present Illness: [name] is a 62 y.o. male with a hx of CAD, s/p angioplasty of the RCA in 2007, MVP, HTN, HL. Last LHC 04/2008:  LM with irregularities, LAD 50%, mid LAD 50%, proximal D1 80-90%, proximal OM1 30-40%, proximal OM2 50%, PLB branch 30-40%, site of prior angioplasty in the RCA widely patent, EF 65%.  It was felt that the bulk of the disease was within small branches that are not suitable for intervention. Continued medical therapy was recommended at that time.  Echo 08/16/09: Mild LVH, EF 55-60%, no normal wall motion, grade 1 diastolic dysfunction.   Nuclear study a 12/2011: EF 71% and normal perfusion.    He was admitted 08/2012 with chest pain. Enzymes were negative. No further workup was recommended.  He did have hyponatremia with a Na of 121. UNa was 14. U osmo 246. He was hydrated with improvement in his sodium level. He was also noted to have microcytic anemia. Follow up with primary care recommended.  He was last seen in the office 09/2012.  Since then, he quit smoking.  He is exercising almost every day.  He feels really good.  The patient denies chest pain, shortness of breath, syncope, orthopnea, PND or significant pedal edema.   Recent Labs: 09/20/2012: HDL 50; LDL (calc) 98  10/19/2012: ALT 27; Creatinine 0.99; Potassium 3.6 03/19/2012: Pro B Natriuretic peptide (BNP) 58.3  09/19/2012: TSH 1.348  10/19/2012: Hemoglobin 11.9*    Wt Readings from Last 3 Encounters:  10/20/12 191 lb (86.637 kg)  10/08/12 198 lb (89.812 kg)  09/20/12 192 lb 12.8 oz (87.454 kg)     Past Medical History  Diagnosis Date  . MVP (mitral valve prolapse)   . Anxiety   . Hypertension   . Hyperlipidemia   . Palpitations   . Bipolar disorder   .  Depression   . Esophagitis   . Gastritis   . Insomnia   . Seizures   . Pneumonia 2005  . Arthritis   . Hypercholesteremia   . Coronary artery disease     a.  s/p PCI to RCA in 2007;  b.  Last LHC 04/2008:  LM with irregularities, LAD 50%, mid LAD 50%, proximal D1 80-90%, proximal OM1 30-40%, proximal OM2 50%, PLB branch 30-40%, site of prior angioplasty in the RCA widely patent, EF 65%=>Med Rx;  c. Nuclear study a 12/2011: EF 71% and normal perfusion.    Marland Kitchen History of echocardiogram     Echo 08/16/09: Mild LVH, EF 55-60%, no normal wall motion, grade 1 diastolic dysfunction    Current Outpatient Prescriptions  Medication Sig Dispense Refill  . aspirin EC 325 MG tablet Take 1 tablet (325 mg total) by mouth every morning. For blood thinner for heart health  30 tablet  0  . atorvastatin (LIPITOR) 40 MG tablet Take 1 tablet (40 mg total) by mouth daily. For cholesterol  30 tablet  0  . buPROPion (WELLBUTRIN XL) 300 MG 24 hr tablet Take 1 tablet (300 mg total) by mouth daily. For depression  30 tablet  0  . cloNIDine (CATAPRES) 0.1 MG tablet Take 1 tablet (0.1 mg total) by mouth 2 (two) times daily. For high blood pressure  control  60 tablet  0  . gabapentin (NEURONTIN) 300 MG capsule Take 1 capsule (300 mg total) by mouth 3 (three) times daily. For anxiety/pain management  90 capsule  0  . hydrOXYzine (ATARAX/VISTARIL) 25 MG tablet Take 1 tablet (25 mg total) by mouth every 6 (six) hours as needed for anxiety.  60 tablet  0  . mirtazapine (REMERON) 30 MG tablet Take 1 tablet (30 mg total) by mouth at bedtime. For depression/sleep  30 tablet  0  . nitroGLYCERIN (NITROSTAT) 0.4 MG SL tablet Place 1 tablet (0.4 mg total) under the tongue every 5 (five) minutes as needed for chest pain (CP or SOB).  15 tablet  0  . QUEtiapine (SEROQUEL XR) 200 MG 24 hr tablet Take 1 tablet (200 mg total) by mouth at bedtime. For mood control  30 tablet  0   No current facility-administered medications for this visit.      Allergies:    Allergies  Allergen Reactions  . Ambien [Zolpidem Tartrate] Other (See Comments)    Sleep Walking  . Codeine Itching    Social History:  The patient  reports that he quit smoking about 2 months ago. His smoking use included Cigarettes. He has a 3.75 pack-year smoking history. He does not have any smokeless tobacco history on file. He reports that he drinks about 14.4 ounces of alcohol per week. He reports that he does not use illicit drugs.   ROS:  Please see the history of present illness.     All other systems reviewed and negative.   PHYSICAL EXAM: VS:  BP 126/78  Pulse 84  Ht 5\' 10"  (1.778 m) Well nourished, well developed, in no acute distress HEENT: normal Neck: no JVD Cardiac:  normal S1, S2; RRR; no murmur Lungs:  clear to auscultation bilaterally, no wheezing, rhonchi or rales Abd: soft, nontender, no hepatomegaly Ext: no edema Skin: warm and dry Neuro:  CNs 2-12 intact, no focal abnormalities noted  EKG:   NSR, HR 84, rightward axis, inferior Q waves, no significant change when compared to prior tracings  ASSESSMENT AND PLAN:  1. CAD:  No angina.  Continue ASA and statin.  2. Hypertension:  Controlled.  He is off of beta blocker for unclear reasons.  BP controlled on Clonidine. He has never had an MI and is not having angina.  He can remain off of beta blocker.   3. Hyperlipidemia:  Continue statin. 4. Disposition: Follow up with Dr. Swaziland in 6 mos.  Signed, Tereso Newcomer, PA-C  01/07/2013 4:09 PM

## 2013-01-07 NOTE — Patient Instructions (Signed)
Your physician wants you to follow-up in:   6 MONTHS WITH DR JORDAN  You will receive a reminder letter in the mail two months in advance. If you don't receive a letter, please call our office to schedule the follow-up appointment. Your physician recommends that you continue on your current medications as directed. Please refer to the Current Medication list given to you today.  

## 2013-01-11 ENCOUNTER — Encounter (HOSPITAL_COMMUNITY): Payer: Self-pay | Admitting: Emergency Medicine

## 2013-01-11 ENCOUNTER — Emergency Department (HOSPITAL_COMMUNITY)
Admission: EM | Admit: 2013-01-11 | Discharge: 2013-01-11 | Disposition: A | Discharge status: Home / Self Care | Payer: Medicare Other | Attending: Emergency Medicine | Admitting: Emergency Medicine

## 2013-01-11 ENCOUNTER — Emergency Department (HOSPITAL_COMMUNITY): Payer: Medicare Other

## 2013-01-11 DIAGNOSIS — Z8701 Personal history of pneumonia (recurrent): Secondary | ICD-10-CM | POA: Insufficient documentation

## 2013-01-11 DIAGNOSIS — I2510 Atherosclerotic heart disease of native coronary artery without angina pectoris: Secondary | ICD-10-CM | POA: Insufficient documentation

## 2013-01-11 DIAGNOSIS — M129 Arthropathy, unspecified: Secondary | ICD-10-CM | POA: Insufficient documentation

## 2013-01-11 DIAGNOSIS — Z7982 Long term (current) use of aspirin: Secondary | ICD-10-CM | POA: Insufficient documentation

## 2013-01-11 DIAGNOSIS — F319 Bipolar disorder, unspecified: Secondary | ICD-10-CM | POA: Insufficient documentation

## 2013-01-11 DIAGNOSIS — Z87891 Personal history of nicotine dependence: Secondary | ICD-10-CM | POA: Insufficient documentation

## 2013-01-11 DIAGNOSIS — G40909 Epilepsy, unspecified, not intractable, without status epilepticus: Secondary | ICD-10-CM | POA: Insufficient documentation

## 2013-01-11 DIAGNOSIS — R109 Unspecified abdominal pain: Secondary | ICD-10-CM

## 2013-01-11 DIAGNOSIS — G4700 Insomnia, unspecified: Secondary | ICD-10-CM | POA: Insufficient documentation

## 2013-01-11 DIAGNOSIS — K7689 Other specified diseases of liver: Secondary | ICD-10-CM | POA: Insufficient documentation

## 2013-01-11 DIAGNOSIS — E7800 Pure hypercholesterolemia, unspecified: Secondary | ICD-10-CM | POA: Insufficient documentation

## 2013-01-11 DIAGNOSIS — Z9861 Coronary angioplasty status: Secondary | ICD-10-CM | POA: Insufficient documentation

## 2013-01-11 DIAGNOSIS — E785 Hyperlipidemia, unspecified: Secondary | ICD-10-CM | POA: Insufficient documentation

## 2013-01-11 DIAGNOSIS — F411 Generalized anxiety disorder: Secondary | ICD-10-CM | POA: Insufficient documentation

## 2013-01-11 DIAGNOSIS — I10 Essential (primary) hypertension: Secondary | ICD-10-CM | POA: Insufficient documentation

## 2013-01-11 DIAGNOSIS — K769 Liver disease, unspecified: Secondary | ICD-10-CM

## 2013-01-11 DIAGNOSIS — Z79899 Other long term (current) drug therapy: Secondary | ICD-10-CM | POA: Insufficient documentation

## 2013-01-11 DIAGNOSIS — I059 Rheumatic mitral valve disease, unspecified: Secondary | ICD-10-CM | POA: Insufficient documentation

## 2013-01-11 LAB — URINE MICROSCOPIC-ADD ON

## 2013-01-11 LAB — CBC WITH DIFFERENTIAL/PLATELET
Basophils Absolute: 0 10*3/uL (ref 0.0–0.1)
Basophils Relative: 0 % (ref 0–1)
Eosinophils Relative: 7 % — ABNORMAL HIGH (ref 0–5)
HCT: 39.3 % (ref 39.0–52.0)
Hemoglobin: 12.7 g/dL — ABNORMAL LOW (ref 13.0–17.0)
Lymphocytes Relative: 42 % (ref 12–46)
Lymphs Abs: 2.3 10*3/uL (ref 0.7–4.0)
MCHC: 32.3 g/dL (ref 30.0–36.0)
MCV: 79.4 fL (ref 78.0–100.0)
Monocytes Absolute: 0.6 10*3/uL (ref 0.1–1.0)
Neutro Abs: 2.2 10*3/uL (ref 1.7–7.7)
Neutrophils Relative %: 40 % — ABNORMAL LOW (ref 43–77)
Platelets: 295 10*3/uL (ref 150–400)
RBC: 4.95 MIL/uL (ref 4.22–5.81)
RDW: 17.2 % — ABNORMAL HIGH (ref 11.5–15.5)
WBC: 5.4 10*3/uL (ref 4.0–10.5)

## 2013-01-11 LAB — COMPREHENSIVE METABOLIC PANEL
ALT: 10 U/L (ref 0–53)
AST: 20 U/L (ref 0–37)
Albumin: 4.3 g/dL (ref 3.5–5.2)
Calcium: 9.7 mg/dL (ref 8.4–10.5)
Creatinine, Ser: 0.98 mg/dL (ref 0.50–1.35)
Glucose, Bld: 78 mg/dL (ref 70–99)
Potassium: 4.3 mEq/L (ref 3.5–5.1)
Sodium: 132 mEq/L — ABNORMAL LOW (ref 135–145)

## 2013-01-11 LAB — URINALYSIS, ROUTINE W REFLEX MICROSCOPIC
Glucose, UA: NEGATIVE mg/dL
Hgb urine dipstick: NEGATIVE
Specific Gravity, Urine: 1.012 (ref 1.005–1.030)
Urobilinogen, UA: 0.2 mg/dL (ref 0.0–1.0)
pH: 6.5 (ref 5.0–8.0)

## 2013-01-11 MED ORDER — SODIUM CHLORIDE 0.9 % IV BOLUS (SEPSIS): 500.0000 mL | Freq: Once | INTRAVENOUS | Status: DC

## 2013-01-11 MED ORDER — KETOROLAC TROMETHAMINE 60 MG/2ML IM SOLN
60.0000 mg | Freq: Once | INTRAMUSCULAR | Status: AC
  Administered 2013-01-11: 60 mg via INTRAMUSCULAR
  Filled 2013-01-11: qty 2

## 2013-01-11 MED ORDER — KETOROLAC TROMETHAMINE 30 MG/ML IJ SOLN
30.0000 mg | Freq: Once | INTRAMUSCULAR | Status: DC
  Filled 2013-01-11: qty 1

## 2013-01-11 MED ORDER — DIAZEPAM 5 MG PO TABS: 5.0000 mg | ORAL_TABLET | Freq: Two times a day (BID) | ORAL | Status: DC

## 2013-01-11 NOTE — ED Provider Notes (Signed)
CSN: [id number]     Arrival date & time 01/11/13  1121 History   First MD Initiated Contact with Patient 01/11/13 1203     Chief Complaint  Patient presents with  . Flank Pain   (Consider location/radiation/quality/duration/timing/severity/associated sxs/prior Treatment) The history is provided by the patient. No language interpreter was used.  [name] is a 62 y/o M with PMHx of MVP, anxiety, HTN, palpitations, Bipolar disorder, insmnia, pneumonia, arthritis, gastritis, HLD, seizures, CAD s/p PCI to RCA in 2007, cardiac cath performed in 04/2008 with EF of 65%, presenting to the ED with left flank pain that started 3 days ago. Patient reported that the pain is localized to the left side described as a shooting pain, throbbing pain without radiation to the abdomen. Reported that the pain worsens with walking and rolling over on to the left side. Reported that he has been using Ibuprofen for pain control. Denied dysuria, hematuria, fever, chills, chest pain, shortness of breath, difficulty breathing, abdominal pain, nausea, vomiting, diarrhea. Denied history of kidney stones.  PCP none   Past Medical History  Diagnosis Date  . MVP (mitral valve prolapse)   . Anxiety   . Hypertension   . Hyperlipidemia   . Palpitations   . Bipolar disorder   . Depression   . Esophagitis   . Gastritis   . Insomnia   . Seizures   . Pneumonia 2005  . Arthritis   . Hypercholesteremia   . Coronary artery disease     a.  s/p PCI to RCA in 2007;  b.  Last LHC 04/2008:  LM with irregularities, LAD 50%, mid LAD 50%, proximal D1 80-90%, proximal OM1 30-40%, proximal OM2 50%, PLB branch 30-40%, site of prior angioplasty in the RCA widely patent, EF 65%=>Med Rx;  c. Nuclear study a 12/2011: EF 71% and normal perfusion.    Marland Kitchen History of echocardiogram     Echo 08/16/09: Mild LVH, EF 55-60%, no normal wall motion, grade 1 diastolic dysfunction   Past Surgical History  Procedure Laterality Date  . Coronary  angioplasty  2007    RCA  . Cardiac catheterization  04/2008    EF 65%  . Knee arthroscopy, medial patello femoral ligament repair    . Hip arthroplasty  bilateral   . Shoulder arthroscopy  right  . Tonsillectomy    . Total hip revision  02/07/2012    Procedure: TOTAL HIP REVISION;  Surgeon: [name], MD;  Location: [facility] OR;  Service: Orthopedics;  Laterality: Left;  [place] Hip Revision Set  . Right knee surgery     Family History  Problem Relation Age of Onset  . Heart disease Neg Hx   . Prostate cancer Father    History  Substance Use Topics  . Smoking status: Former Smoker -- 0.25 packs/day for 15 years    Types: Cigarettes    Quit date: 10/19/2012  . Smokeless tobacco: Not on file  . Alcohol Use: No    Review of Systems  Constitutional: Negative for fever and chills.  Respiratory: Negative for chest tightness and shortness of breath.   Cardiovascular: Negative for chest pain.  Gastrointestinal: Negative for nausea, vomiting, abdominal pain and diarrhea.  Genitourinary: Positive for flank pain (left sided). Negative for hematuria.  Neurological: Negative for dizziness and weakness.  All other systems reviewed and are negative.    Allergies  Ambien and Codeine  Home Medications   Current Outpatient Rx  Name  Route  Sig  Dispense  Refill  . aspirin EC 325 MG tablet   Oral   Take 1 tablet (325 mg total) by mouth every morning. For blood thinner for heart health   30 tablet   0   . atorvastatin (LIPITOR) 40 MG tablet   Oral   Take 1 tablet (40 mg total) by mouth daily. For cholesterol   30 tablet   0   . buPROPion (WELLBUTRIN XL) 300 MG 24 hr tablet   Oral   Take 1 tablet (300 mg total) by mouth daily. For depression   30 tablet   0   . hydrOXYzine (ATARAX/VISTARIL) 25 MG tablet   Oral   Take 1 tablet (25 mg total) by mouth every 6 (six) hours as needed for anxiety.   60 tablet   0   . metoprolol succinate (TOPROL-XL) 25 MG 24 hr tablet   Oral    Take 25 mg by mouth daily.         . mirtazapine (REMERON) 30 MG tablet   Oral   Take 1 tablet (30 mg total) by mouth at bedtime. For depression/sleep   30 tablet   0   . nitroGLYCERIN (NITROSTAT) 0.4 MG SL tablet   Sublingual   Place 1 tablet (0.4 mg total) under the tongue every 5 (five) minutes as needed for chest pain (CP or SOB).   15 tablet   0   . QUEtiapine (SEROQUEL XR) 50 MG TB24 24 hr tablet   Oral   Take 200 mg by mouth at bedtime.         . diazepam (VALIUM) 5 MG tablet   Oral   Take 1 tablet (5 mg total) by mouth 2 (two) times daily.   10 tablet   0    BP 158/108  Pulse 74  Temp(Src) 98 F (36.7 C) (Oral)  Resp 16  SpO2 99% Physical Exam  Nursing note and vitals reviewed. Constitutional: He is oriented to person, place, and time. He appears well-developed and well-nourished. No distress.  HENT:  Head: Normocephalic and atraumatic.  Neck: Normal range of motion. Neck supple.  Cardiovascular: Normal rate, regular rhythm and normal heart sounds.  Exam reveals no friction rub.   No murmur heard. Pulses:      Radial pulses are 2+ on the right side, and 2+ on the left side.       Dorsalis pedis pulses are 2+ on the right side, and 2+ on the left side.  Pulmonary/Chest: Effort normal and breath sounds normal. No respiratory distress. He has no wheezes. He has no rales.  Abdominal: Soft. Bowel sounds are normal. There is no tenderness. There is no guarding.  CVA tenderness to the left   Musculoskeletal: Normal range of motion.  Full ROM to upper and lower extremities without difficulty noted, negative ataxia noted  Neurological: He is alert and oriented to person, place, and time. He exhibits normal muscle tone. Coordination normal.  Skin: Skin is warm and dry. No rash noted. He is not diaphoretic. No erythema.  Psychiatric: He has a normal mood and affect. His behavior is normal. Thought content normal.    ED Course  Procedures (including critical care  time)  RN reported that she was unable to get an IV started on this patient - recommended toradol to be administered IM.   3:29 PM This provider re-assessed patient. Reported that he is feeling good. Patient ate subway and drinking [employer]. Patient reported that the Toradol helped a little bit, but  still has pain. Discussed labs and imaging in great detail with patient. Discussed with patient new findings on the CT scan. Discussed with patient plan for discharge.   Results for orders placed during the hospital encounter of 01/11/13  CBC WITH DIFFERENTIAL      Result Value Range   WBC 5.4  4.0 - 10.5 K/uL   RBC 4.95  4.22 - 5.81 MIL/uL   Hemoglobin 12.7 (*) 13.0 - 17.0 g/dL   HCT 40.9  81.1 - 91.4 %   MCV 79.4  78.0 - 100.0 fL   MCH 25.7 (*) 26.0 - 34.0 pg   MCHC 32.3  30.0 - 36.0 g/dL   RDW 78.2 (*) 95.6 - 21.3 %   Platelets 295  150 - 400 K/uL   Neutrophils Relative % 40 (*) 43 - 77 %   Neutro Abs 2.2  1.7 - 7.7 K/uL   Lymphocytes Relative 42  12 - 46 %   Lymphs Abs 2.3  0.7 - 4.0 K/uL   Monocytes Relative 11  3 - 12 %   Monocytes Absolute 0.6  0.1 - 1.0 K/uL   Eosinophils Relative 7 (*) 0 - 5 %   Eosinophils Absolute 0.4  0.0 - 0.7 K/uL   Basophils Relative 0  0 - 1 %   Basophils Absolute 0.0  0.0 - 0.1 K/uL  COMPREHENSIVE METABOLIC PANEL      Result Value Range   Sodium 132 (*) 135 - 145 mEq/L   Potassium 4.3  3.5 - 5.1 mEq/L   Chloride 98  96 - 112 mEq/L   CO2 20  19 - 32 mEq/L   Glucose, Bld 78  70 - 99 mg/dL   BUN 12  6 - 23 mg/dL   Creatinine, Ser 0.86  0.50 - 1.35 mg/dL   Calcium 9.7  8.4 - 57.8 mg/dL   Total Protein 7.6  6.0 - 8.3 g/dL   Albumin 4.3  3.5 - 5.2 g/dL   AST 20  0 - 37 U/L   ALT 10  0 - 53 U/L   Alkaline Phosphatase 74  39 - 117 U/L   Total Bilirubin 0.3  0.3 - 1.2 mg/dL   GFR calc non Af Amer 87 (*) >90 mL/min   GFR calc Af Amer >90  >90 mL/min  URINALYSIS, ROUTINE W REFLEX MICROSCOPIC      Result Value Range   Color, Urine YELLOW  YELLOW    APPearance CLEAR  CLEAR   Specific Gravity, Urine 1.012  1.005 - 1.030   pH 6.5  5.0 - 8.0   Glucose, UA NEGATIVE  NEGATIVE mg/dL   Hgb urine dipstick NEGATIVE  NEGATIVE   Bilirubin Urine NEGATIVE  NEGATIVE   Ketones, ur NEGATIVE  NEGATIVE mg/dL   Protein, ur NEGATIVE  NEGATIVE mg/dL   Urobilinogen, UA 0.2  0.0 - 1.0 mg/dL   Nitrite NEGATIVE  NEGATIVE   Leukocytes, UA TRACE (*) NEGATIVE  URINE MICROSCOPIC-ADD ON      Result Value Range   WBC, UA 0-2  <3 WBC/hpf   Urine-Other LESS THAN 10 mL OF URINE SUBMITTED     Ct Abdomen Pelvis Wo Contrast  01/11/2013   CLINICAL DATA:  Abdominal pain (left greater than right).  EXAM: CT ABDOMEN AND PELVIS WITHOUT CONTRAST  TECHNIQUE: Multidetector CT imaging of the abdomen and pelvis was performed following the standard protocol without intravenous contrast.  COMPARISON:  CT of the abdomen and pelvis 08/13/2009.  FINDINGS: Lung Bases: Atherosclerosis of  the right coronary artery.  Abdomen/Pelvis: Image 36 of series 2 demonstrates a 2 mm calcification in the lower pole left renal collecting system, however, this is favored to be an atherosclerotic calcification and rather than a nonobstructive calculus. No additional potential calculi are identified within the collecting system of either kidney, along the course of the visualized portions of either ureter, or within the lumen of the urinary bladder. No hydroureteronephrosis to suggest urinary tract obstruction at this time. Please note, that this study is limited by extensive beam hardening artifact from the patient's bilateral total hip arthroplasties, which obscures significant portions of the low anatomic pelvis. There are multiple renal lesions bilaterally, the largest of which measures 2.5 cm extending exophytically off the upper pole of the right kidney, measuring 37 HU. Several the smaller lesions are low attenuation.  Several small low-attenuation lesions are noted throughout the liver. These are  incompletely characterized on today's non contrast CT examination, but do appear to be new compared to 08/13/2009, with the largest lesion measuring only 11 mm in segment 2 of the liver (image 15 of series 2). The appearance of the gallbladder, pancreas, spleen and bilateral adrenal glands is unremarkable. Normal appendix. No significant volume of ascites. No pneumoperitoneum. No pathologic distention of small bowel. There are a few scattered colonic diverticulae, without surrounding inflammatory changes to suggest an acute diverticulitis at this time. No definite lymphadenopathy identified within the abdomen or pelvis. Atherosclerosis throughout the abdominal and pelvic vasculature, without evidence of aneurysm.  Musculoskeletal: Bilateral total hip arthroplasties are noted. There are no aggressive appearing lytic or blastic lesions noted in the visualized portions of the skeleton.  IMPRESSION: 1. No definite acute findings in the abdomen or pelvis to account for the patient's symptoms. There is a 2 mm calcification associated with the lower pole of the left kidney, however, this is favored to represent a vascular calcification rather than a tiny nonobstructive calculus. 2. Multiple incompletely characterized renal lesions which appear similar in size to prior CT scan 08/13/2009, favored to represent a combination of simple cysts and proteinaceous or hemorrhagic cysts. However, in addition, there are multiple new ill-defined low-attenuation hepatic lesions which were not visualized on that prior CT scan. If there is clinical concern for metastatic disease to the liver or other pathology, these could be definitively characterized with MRI of the liver with and without IV gadolinium if indicated. 3. Normal appendix. 4. Atherosclerosis, including right coronary artery disease. 5. Colonic diverticulosis without evidence to suggest acute diverticulitis at this time.   Electronically Signed   By: Trudie Reed M.D.    On: 01/11/2013 14:46   Labs Review Labs Reviewed  CBC WITH DIFFERENTIAL - Abnormal; Notable for the following:    Hemoglobin 12.7 (*)    MCH 25.7 (*)    RDW 17.2 (*)    Neutrophils Relative % 40 (*)    Eosinophils Relative 7 (*)    All other components within normal limits  COMPREHENSIVE METABOLIC PANEL - Abnormal; Notable for the following:    Sodium 132 (*)    GFR calc non Af Amer 87 (*)    All other components within normal limits  URINALYSIS, ROUTINE W REFLEX MICROSCOPIC - Abnormal; Notable for the following:    Leukocytes, UA TRACE (*)    All other components within normal limits  URINE MICROSCOPIC-ADD ON   Imaging Review Ct Abdomen Pelvis Wo Contrast  01/11/2013   CLINICAL DATA:  Abdominal pain (left greater than right).  EXAM: CT ABDOMEN  AND PELVIS WITHOUT CONTRAST  TECHNIQUE: Multidetector CT imaging of the abdomen and pelvis was performed following the standard protocol without intravenous contrast.  COMPARISON:  CT of the abdomen and pelvis 08/13/2009.  FINDINGS: Lung Bases: Atherosclerosis of the right coronary artery.  Abdomen/Pelvis: Image 36 of series 2 demonstrates a 2 mm calcification in the lower pole left renal collecting system, however, this is favored to be an atherosclerotic calcification and rather than a nonobstructive calculus. No additional potential calculi are identified within the collecting system of either kidney, along the course of the visualized portions of either ureter, or within the lumen of the urinary bladder. No hydroureteronephrosis to suggest urinary tract obstruction at this time. Please note, that this study is limited by extensive beam hardening artifact from the patient's bilateral total hip arthroplasties, which obscures significant portions of the low anatomic pelvis. There are multiple renal lesions bilaterally, the largest of which measures 2.5 cm extending exophytically off the upper pole of the right kidney, measuring 37 HU. Several the  smaller lesions are low attenuation.  Several small low-attenuation lesions are noted throughout the liver. These are incompletely characterized on today's non contrast CT examination, but do appear to be new compared to 08/13/2009, with the largest lesion measuring only 11 mm in segment 2 of the liver (image 15 of series 2). The appearance of the gallbladder, pancreas, spleen and bilateral adrenal glands is unremarkable. Normal appendix. No significant volume of ascites. No pneumoperitoneum. No pathologic distention of small bowel. There are a few scattered colonic diverticulae, without surrounding inflammatory changes to suggest an acute diverticulitis at this time. No definite lymphadenopathy identified within the abdomen or pelvis. Atherosclerosis throughout the abdominal and pelvic vasculature, without evidence of aneurysm.  Musculoskeletal: Bilateral total hip arthroplasties are noted. There are no aggressive appearing lytic or blastic lesions noted in the visualized portions of the skeleton.  IMPRESSION: 1. No definite acute findings in the abdomen or pelvis to account for the patient's symptoms. There is a 2 mm calcification associated with the lower pole of the left kidney, however, this is favored to represent a vascular calcification rather than a tiny nonobstructive calculus. 2. Multiple incompletely characterized renal lesions which appear similar in size to prior CT scan 08/13/2009, favored to represent a combination of simple cysts and proteinaceous or hemorrhagic cysts. However, in addition, there are multiple new ill-defined low-attenuation hepatic lesions which were not visualized on that prior CT scan. If there is clinical concern for metastatic disease to the liver or other pathology, these could be definitively characterized with MRI of the liver with and without IV gadolinium if indicated. 3. Normal appendix. 4. Atherosclerosis, including right coronary artery disease. 5. Colonic diverticulosis  without evidence to suggest acute diverticulitis at this time.   Electronically Signed   By: Trudie Reed M.D.   On: 01/11/2013 14:46    EKG Interpretation   None       MDM   1. Liver lesion   2. Flank pain     Medications  ketorolac (TORADOL) injection 60 mg (60 mg Intramuscular Given 01/11/13 1434)   Filed Vitals:   01/11/13 1139 01/11/13 1508  BP: 134/93 158/108  Pulse: 78 74  Temp: 98 F (36.7 C)   TempSrc: Oral   Resp: 16 16  SpO2: 98% 99%    Patient presenting to the ED with left flank pain that started 3 days ago.  Alert and oriented. GCS 15. Heart rate and rhythm normal. Lungs clear to auscultation to  upper and lower lobes. BS normoactive in all 4 quadrants, soft, non-tender. Negative acute abdomen, negative peritoneal signs. Positive left CVA tenderness.  CBC negative findings. CMP hyponatremia noted of 132 - negative elevation in transaminases or bilirubin. UA negative for Hgb, trace of leukocytes noted. CT abdomen and pelvis without contrast with no acute abnormalities noted  - 2 mm calcification associated to the lower pole of the left kidney, favored to be a vascular calcification rather than calculus. Simple cysts noted on the CT scan to the kidneys. New lesions to the liver identified - concern for possible metastatic disease, recommended MRI to be performed. Normal appendix. Colonic diverticulosis without findings consistent with diverticulitis.  Patient stable, afebrile. Doubt UTI. Doubt pyelonephritis. Negative findings of nephrolithiasis.New liver lesions noted on the CT scan - discussed with patient that he needs to get this re-assessed and MRI to be performed to rule out benign/malignant. Definitive etiology of flank pain unknown - possible muscle strain since pain with palpation and motion. Discharged patient with muscle relaxers, reported that when he took Tramadol and Valium at home he was able to relax and sleep. Referred patient to PCP, general surgery,  and Urology. Discussed with patient to rest and stay hydrated. Discussed with patient to closely monitor symptoms and if symptoms are to worsen or change to report back to the ED - strict return instructions given.  Patient agreed to plan of care, understood, all questions answered.   Raymon Mutton, PA-C 01/11/13 1714  Raymon Mutton, PA-C 01/11/13 1734

## 2013-01-11 NOTE — ED Notes (Addendum)
Discharge orders given to patient and explained; pt verbalized understanding instrucitions.  Pt also given prescription for valium.  Pt rates pain at 8/10; pt reports this as tolerable and plans to go home and get some sleep.
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
## 2013-01-11 NOTE — ED Notes (Signed)
Patient attempted to void, was not successful. Patient with urinal at bedside for re-attempt.

## 2013-01-11 NOTE — ED Notes (Signed)
Patient transported to CT 

## 2013-01-11 NOTE — ED Notes (Signed)
Patient eating a subway sandwich and large iced tea. Was unable to tolerate IV start. Informed Marissa and orders changed to IM Toradol and D/C IV

## 2013-01-11 NOTE — ED Notes (Signed)
Patient is here from home. Patient c/o of flank back pain that has been going on for 4 days. Patient denies N/V, burning on urination at this time. Patient states he took one of his friends Valium and it made it feel better for as couple of hours. Patient is appropriate for age,

## 2013-01-14 NOTE — ED Provider Notes (Signed)
Medical screening examination/treatment/procedure(s) were performed by non-physician practitioner and as supervising physician I was immediately available for consultation/collaboration.    Celene Kras, MD 01/14/13 [phone]

## 2013-08-09 ENCOUNTER — Encounter (HOSPITAL_COMMUNITY): Payer: Self-pay | Admitting: Emergency Medicine

## 2013-08-09 ENCOUNTER — Emergency Department (HOSPITAL_COMMUNITY)
Admission: EM | Admit: 2013-08-09 | Discharge: 2013-08-09 | Disposition: A | Discharge status: Home / Self Care | Payer: Medicare Other | Attending: Emergency Medicine | Admitting: Emergency Medicine

## 2013-08-09 ENCOUNTER — Emergency Department (HOSPITAL_COMMUNITY): Payer: Medicare Other

## 2013-08-09 DIAGNOSIS — I2510 Atherosclerotic heart disease of native coronary artery without angina pectoris: Secondary | ICD-10-CM | POA: Insufficient documentation

## 2013-08-09 DIAGNOSIS — Z87891 Personal history of nicotine dependence: Secondary | ICD-10-CM | POA: Insufficient documentation

## 2013-08-09 DIAGNOSIS — Z8701 Personal history of pneumonia (recurrent): Secondary | ICD-10-CM | POA: Diagnosis not present

## 2013-08-09 DIAGNOSIS — Z9889 Other specified postprocedural states: Secondary | ICD-10-CM | POA: Insufficient documentation

## 2013-08-09 DIAGNOSIS — F419 Anxiety disorder, unspecified: Secondary | ICD-10-CM

## 2013-08-09 DIAGNOSIS — M129 Arthropathy, unspecified: Secondary | ICD-10-CM | POA: Diagnosis not present

## 2013-08-09 DIAGNOSIS — I498 Other specified cardiac arrhythmias: Secondary | ICD-10-CM | POA: Diagnosis not present

## 2013-08-09 DIAGNOSIS — G40909 Epilepsy, unspecified, not intractable, without status epilepticus: Secondary | ICD-10-CM | POA: Insufficient documentation

## 2013-08-09 DIAGNOSIS — I10 Essential (primary) hypertension: Secondary | ICD-10-CM | POA: Diagnosis not present

## 2013-08-09 DIAGNOSIS — Z9861 Coronary angioplasty status: Secondary | ICD-10-CM | POA: Diagnosis not present

## 2013-08-09 DIAGNOSIS — E7800 Pure hypercholesterolemia, unspecified: Secondary | ICD-10-CM | POA: Insufficient documentation

## 2013-08-09 DIAGNOSIS — R000 Tachycardia, unspecified: Secondary | ICD-10-CM

## 2013-08-09 DIAGNOSIS — Z8719 Personal history of other diseases of the digestive system: Secondary | ICD-10-CM | POA: Insufficient documentation

## 2013-08-09 DIAGNOSIS — I059 Rheumatic mitral valve disease, unspecified: Secondary | ICD-10-CM | POA: Insufficient documentation

## 2013-08-09 DIAGNOSIS — Z7982 Long term (current) use of aspirin: Secondary | ICD-10-CM | POA: Insufficient documentation

## 2013-08-09 DIAGNOSIS — F319 Bipolar disorder, unspecified: Secondary | ICD-10-CM | POA: Diagnosis not present

## 2013-08-09 DIAGNOSIS — R079 Chest pain, unspecified: Secondary | ICD-10-CM | POA: Insufficient documentation

## 2013-08-09 DIAGNOSIS — F411 Generalized anxiety disorder: Secondary | ICD-10-CM | POA: Diagnosis not present

## 2013-08-09 DIAGNOSIS — F1010 Alcohol abuse, uncomplicated: Secondary | ICD-10-CM | POA: Insufficient documentation

## 2013-08-09 DIAGNOSIS — Z79899 Other long term (current) drug therapy: Secondary | ICD-10-CM | POA: Diagnosis not present

## 2013-08-09 LAB — I-STAT CHEM 8, ED
CALCIUM ION: 1.12 mmol/L — AB (ref 1.13–1.30)
Chloride: 104 mEq/L (ref 96–112)
Creatinine, Ser: 1.3 mg/dL (ref 0.50–1.35)
GLUCOSE: 101 mg/dL — AB (ref 70–99)
HCT: 40 % (ref 39.0–52.0)
Hemoglobin: 13.6 g/dL (ref 13.0–17.0)
Potassium: 4.3 mEq/L (ref 3.7–5.3)
Sodium: 136 mEq/L — ABNORMAL LOW (ref 137–147)
TCO2: 20 mmol/L (ref 0–100)

## 2013-08-09 LAB — COMPREHENSIVE METABOLIC PANEL
ALBUMIN: 3.6 g/dL (ref 3.5–5.2)
ALK PHOS: 92 U/L (ref 39–117)
ALT: 39 U/L (ref 0–53)
AST: 51 U/L — AB (ref 0–37)
Anion gap: 17 — ABNORMAL HIGH (ref 5–15)
BUN: 5 mg/dL — ABNORMAL LOW (ref 6–23)
CHLORIDE: 98 meq/L (ref 96–112)
CO2: 22 mEq/L (ref 19–32)
Calcium: 8.5 mg/dL (ref 8.4–10.5)
Creatinine, Ser: 0.88 mg/dL (ref 0.50–1.35)
GFR calc Af Amer: >90 mL/min (ref >90)
Glucose, Bld: 101 mg/dL — ABNORMAL HIGH (ref 70–99)
Potassium: 4.4 mEq/L (ref 3.7–5.3)
SODIUM: 137 meq/L (ref 137–147)
Total Protein: 6.6 g/dL (ref 6.0–8.3)

## 2013-08-09 LAB — CBC
HEMATOCRIT: 35.7 % — AB (ref 39.0–52.0)
Hemoglobin: 11.7 g/dL — ABNORMAL LOW (ref 13.0–17.0)
MCH: 26.6 pg (ref 26.0–34.0)
MCHC: 32.8 g/dL (ref 30.0–36.0)
MCV: 81.1 fL (ref 78.0–100.0)
PLATELETS: 205 10*3/uL (ref 150–400)
RBC: 4.4 MIL/uL (ref 4.22–5.81)
RDW: 15.7 % — ABNORMAL HIGH (ref 11.5–15.5)
WBC: 7.4 10*3/uL (ref 4.0–10.5)

## 2013-08-09 LAB — LIPASE, BLOOD: Lipase: 75 U/L — ABNORMAL HIGH (ref 11–59)

## 2013-08-09 LAB — TROPONIN I

## 2013-08-09 LAB — ETHANOL: Alcohol, Ethyl (B): 305 mg/dL — ABNORMAL HIGH (ref 0–11)

## 2013-08-09 MED ORDER — IPRATROPIUM-ALBUTEROL 0.5-2.5 (3) MG/3ML IN SOLN
3.0000 mL | Freq: Once | RESPIRATORY_TRACT | Status: AC
  Administered 2013-08-09: 3 mL via RESPIRATORY_TRACT
  Filled 2013-08-09: qty 3

## 2013-08-09 MED ORDER — LORAZEPAM 2 MG/ML IJ SOLN
1.0000 mg | Freq: Once | INTRAMUSCULAR | Status: AC
  Administered 2013-08-09: 1 mg via INTRAVENOUS
  Filled 2013-08-09: qty 1

## 2013-08-09 MED ORDER — IOHEXOL 350 MG/ML SOLN
100.0000 mL | Freq: Once | INTRAVENOUS | Status: AC | PRN
  Administered 2013-08-09: 100 mL via INTRAVENOUS

## 2013-08-09 NOTE — Discharge Instructions (Signed)
Your workup today has not shown a specific cause for your pain.  It is recommended that you cut back or stop drinking alcohol altogether.  This may be contributing to your pain.  Followup with your Dr. for recheck in 2-3 days.  Return to the emergency room for worsening condition or new concerning symptoms.   Chest Pain Observation It is often hard to give a specific diagnosis for the cause of chest pain. Among other possibilities your symptoms might be caused by inadequate oxygen delivery to your heart (angina). Angina that is not treated or evaluated can lead to a heart attack (myocardial infarction) or death. Blood tests, electrocardiograms, and X-rays may have been done to help determine a possible cause of your chest pain. After evaluation and observation, your health care provider has determined that it is unlikely your pain was caused by an unstable condition that requires hospitalization. However, a full evaluation of your pain may need to be completed, with additional diagnostic testing as directed. It is very important to keep your follow-up appointments. Not keeping your follow-up appointments could result in permanent heart damage, disability, or death. If there is any problem keeping your follow-up appointments, you must call your health care provider. HOME CARE INSTRUCTIONS  Due to the slight chance that your pain could be angina, it is important to follow your health care provider's treatment plan and also maintain a healthy lifestyle:  Maintain or work toward achieving a healthy weight.  Stay physically active and exercise regularly.  Decrease your salt intake.  Eat a balanced, healthy diet. Talk to a dietitian to learn about heart-healthy foods.  Increase your fiber intake by including whole grains, vegetables, fruits, and nuts in your diet.  Avoid situations that cause stress, anger, or depression.  Take medicines as advised by your health care provider. Report any side effects  to your health care provider. Do not stop medicines or adjust the dosages on your own.  Quit smoking. Do not use nicotine patches or gum until you check with your health care provider.  Keep your blood pressure, blood sugar, and cholesterol levels within normal limits.  Limit alcohol intake to no more than 1 drink per day for women who are not pregnant and 2 drinks per day for men.  Do not abuse drugs. SEEK IMMEDIATE MEDICAL CARE IF: You have severe chest pain or pressure which may include symptoms such as:  You feel pain or pressure in your arms, neck, jaw, or back.  You have severe back or abdominal pain, feel sick to your stomach (nauseous), or throw up (vomit).  You are sweating profusely.  You are having a fast or irregular heartbeat.  You feel short of breath while at rest.  You notice increasing shortness of breath during rest, sleep, or with activity.  You have chest pain that does not get better after rest or after taking your usual medicine.  You wake from sleep with chest pain.  You are unable to sleep because you cannot breathe.  You develop a frequent cough or you are coughing up blood.  You feel dizzy, faint, or experience extreme fatigue.  You develop severe weakness, dizziness, fainting, or chills. Any of these symptoms may represent a serious problem that is an emergency. Do not wait to see if the symptoms will go away. Call your local emergency services (911 in the [place] Do not drive yourself to the hospital. MAKE SURE YOU:  Understand these instructions.  Will watch your condition.  Will  get help right away if you are not doing well or get worse. Document Released: 02/11/2010 Document Revised: 01/14/2013 Document Reviewed: 07/11/2012 [facility] Patient Information 2015 [place], [place]. This information is not intended to replace advice given to you by your health care provider. Make sure you discuss any questions you have with your health care  provider.  Pain of Unknown Etiology (Pain Without a Known Cause) You have come to your caregiver because of pain. Pain can occur in any part of the body. Often there is not a definite cause. If your laboratory (blood or urine) work was normal and X-rays or other studies were normal, your caregiver may treat you without knowing the cause of the pain. An example of this is the headache. Most headaches are diagnosed by taking a history. This means your caregiver asks you questions about your headaches. Your caregiver determines a treatment based on your answers. Usually testing done for headaches is normal. Often testing is not done unless there is no response to medications. Regardless of where your pain is located today, you can be given medications to make you comfortable. If no physical cause of pain can be found, most cases of pain will gradually leave as suddenly as they came.  If you have a painful condition and no reason can be found for the pain, it is important that you follow up with your caregiver. If the pain becomes worse or does not go away, it may be necessary to repeat tests and look further for a possible cause.  Only take over-the-counter or prescription medicines for pain, discomfort, or fever as directed by your caregiver.  For the protection of your privacy, test results cannot be given over the phone. Make sure you receive the results of your test. Ask how these results are to be obtained if you have not been informed. It is your responsibility to obtain your test results.  You may continue all activities unless the activities cause more pain. When the pain lessens, it is important to gradually resume normal activities. Resume activities by beginning slowly and gradually increasing the intensity and duration of the activities or exercise. During periods of severe pain, bed rest may be helpful. Lie or sit in any position that is comfortable.  Ice used for acute (sudden) conditions may  be effective. Use a large plastic bag filled with ice and wrapped in a towel. This may provide pain relief.  See your caregiver for continued problems. Your caregiver can help or refer you for exercises or physical therapy if necessary. If you were given medications for your condition, do not drive, operate machinery or power tools, or sign legal documents for 24 hours. Do not drink alcohol, take sleeping pills, or take other medications that may interfere with treatment. See your caregiver immediately if you have pain that is becoming worse and not relieved by medications. Document Released: 10/04/2000 Document Revised: 10/30/2012 Document Reviewed: 01/09/2005 [facility] Patient Information 2015 [place], [place]. This information is not intended to replace advice given to you by your health care provider. Make sure you discuss any questions you have with your health care provider.  Alcohol Use Disorder Alcohol use disorder is a mental disorder. It is not a one-time incident of heavy drinking. Alcohol use disorder is the excessive and uncontrollable use of alcohol over time that leads to problems with functioning in one or more areas of daily living. People with this disorder risk harming themselves and others when they drink to excess. Alcohol use disorder  also can cause other mental disorders, such as mood and anxiety disorders, and serious physical problems. People with alcohol use disorder often misuse other drugs.  Alcohol use disorder is common and widespread. Some people with this disorder drink alcohol to cope with or escape from negative life events. Others drink to relieve chronic pain or symptoms of mental illness. People with a family history of alcohol use disorder are at higher risk of losing control and using alcohol to excess.  SYMPTOMS  Signs and symptoms of alcohol use disorder may include the following:   Consumption ofalcohol inlarger amounts or over a longer period of time than  intended.  Multiple unsuccessful attempts to cutdown or control alcohol use.   A great deal of time spent obtaining alcohol, using alcohol, or recovering from the effects of alcohol (hangover).  A strong desire or urge to use alcohol (cravings).   Continued use of alcohol despite problems at work, school, or home because of alcohol use.   Continued use of alcohol despite problems in relationships because of alcohol use.  Continued use of alcohol in situations when it is physically hazardous, such as driving a car.  Continued use of alcohol despite awareness of a physical or psychological problem that is likely related to alcohol use. Physical problems related to alcohol use can involve the brain, heart, liver, stomach, and intestines. Psychological problems related to alcohol use include intoxication, depression, anxiety, psychosis, delirium, and dementia.   The need for increased amounts of alcohol to achieve the same desired effect, or a decreased effect from the consumption of the same amount of alcohol (tolerance).  Withdrawal symptoms upon reducing or stopping alcohol use, or alcohol use to reduce or avoid withdrawal symptoms. Withdrawal symptoms include:  Racing heart.  Hand tremor.  Difficulty sleeping.  Nausea.  Vomiting.  Hallucinations.  Restlessness.  Seizures. DIAGNOSIS Alcohol use disorder is diagnosed through an assessment by your caregiver. Your caregiver may start by asking three or four questions to screen for excessive or problematic alcohol use. To confirm a diagnosis of alcohol use disorder, at least two symptoms (see SYMPTOMS) must be present within a 20-month period. The severity of alcohol use disorder depends on the number of symptoms:  Mild--two or three.  Moderate--four or five.  Severe--six or more. Your caregiver may perform a physical exam or use results from lab tests to see if you have physical problems resulting from alcohol use. Your  caregiver may refer you to a mental health professional for evaluation. TREATMENT  Some people with alcohol use disorder are able to reduce their alcohol use to low-risk levels. Some people with alcohol use disorder need to quit drinking alcohol. When necessary, mental health professionals with specialized training in substance use treatment can help. Your caregiver can help you decide how severe your alcohol use disorder is and what type of treatment you need. The following forms of treatment are available:   Detoxification. Detoxification involves the use of prescription medication to prevent alcohol withdrawal symptoms in the first week after quitting. This is important for people with a history of symptoms of withdrawal and for heavy drinkers who are likely to have withdrawal symptoms. Alcohol withdrawal can be dangerous and, in severe cases, cause death. Detoxification is usually provided in a hospital or in-patient substance use treatment facility.  Counseling or talk therapy. Talk therapy is provided by substance use treatment counselors. It addresses the reasons people use alcohol and ways to keep them from drinking again. The goals of talk  therapy are to help people with alcohol use disorder find healthy activities and ways to cope with life stress, to identify and avoid triggers for alcohol use, and to handle cravings, which can cause relapse.  Medication.Different medications can help treat alcohol use disorder through the following actions:  Decrease alcohol cravings.  Decrease the positive reward response felt from alcohol use.  Produce an uncomfortable physical reaction when alcohol is used (aversion therapy).  Support groups. Support groups are run by people who have quit drinking. They provide emotional support, advice, and guidance. These forms of treatment are often combined. Some people with alcohol use disorder benefit from intensive combination treatment provided by specialized  substance use treatment centers. Both inpatient and outpatient treatment programs are available. Document Released: 02/17/2004 Document Revised: 09/11/2012 Document Reviewed: 04/18/2012 [facility] Patient Information 2015 [place], [place]. This information is not intended to replace advice given to you by your health care provider. Make sure you discuss any questions you have with your health care provider.

## 2013-08-09 NOTE — ED Notes (Signed)
Pt is aware of need for urine sample.  He has been unable to do so.  Urinal at bedside. Pt encouraged to provide sample asap.

## 2013-08-09 NOTE — ED Notes (Signed)
Walked into pt room to discharge him, blood splattered around bed.  Pt heard the dr tell him he was going to be discharged home which prompted him to remove all his IV's, remove monitor and climb out of the bed.  Uncooperative with obtaining another set of VS.

## 2013-08-09 NOTE — ED Provider Notes (Signed)
CSN: [id number]     Arrival date & time 08/09/13  0058 History   First MD Initiated Contact with Patient 08/09/13 0133     Chief Complaint  Patient presents with  . Chest Pain     (Consider location/radiation/quality/duration/timing/severity/associated sxs/prior Treatment) HPI 63 year old male presents to emergency department from home with complaint of chest pain ongoing for the last 3 days.  Chest pain as central.  It is been near constant.  It is associated with shortness of breath heaviness.  No radiation no diaphoresis.  Patient reports pain is somewhat like his previous angina.  Patient has history of stent to RCA in 2007.  Per report, heart cath in 2010 with diffuse disease and marginal vessels.  Patient also has history of anxiety.  He reports that he and his boyfriend broke up 3 months ago and since that time he has been more anxious.  He reports that he has started smoking again.  He denies any fever or chills.  He does report shortness of breath.  He denies any orthopnea or PND.  He denies any leg swelling.  Denies any prolonged immobilization.  No prior history of PE or DVT. Past Medical History  Diagnosis Date  . MVP (mitral valve prolapse)   . Anxiety   . Hypertension   . Hyperlipidemia   . Palpitations   . Bipolar disorder   . Depression   . Esophagitis   . Gastritis   . Insomnia   . Seizures   . Pneumonia 2005  . Arthritis   . Hypercholesteremia   . Coronary artery disease     a.  s/p PCI to RCA in 2007;  b.  Last LHC 04/2008:  LM with irregularities, LAD 50%, mid LAD 50%, proximal D1 80-90%, proximal OM1 30-40%, proximal OM2 50%, PLB branch 30-40%, site of prior angioplasty in the RCA widely patent, EF 65%=>Med Rx;  c. Nuclear study a 12/2011: EF 71% and normal perfusion.    Marland Kitchen. History of echocardiogram     Echo 08/16/09: Mild LVH, EF 55-60%, no normal wall motion, grade 1 diastolic dysfunction   Past Surgical History  Procedure Laterality Date  . Coronary angioplasty   2007    RCA  . Cardiac catheterization  04/2008    EF 65%  . Knee arthroscopy, medial patello femoral ligament repair    . Hip arthroplasty  bilateral   . Shoulder arthroscopy  right  . Tonsillectomy    . Total hip revision  02/07/2012    Procedure: TOTAL HIP REVISION;  Surgeon: Nestor LewandowskyFrank J Rowan, MD;  Location: [facility] OR;  Service: Orthopedics;  Laterality: Left;  [place] Hip Revision Set  . Right knee surgery     Family History  Problem Relation Age of Onset  . Heart disease Neg Hx   . Prostate cancer Father    History  Substance Use Topics  . Smoking status: Former Smoker -- 0.25 packs/day for 15 years    Types: Cigarettes    Quit date: 10/19/2012  . Smokeless tobacco: Not on file  . Alcohol Use: No    Review of Systems   See History of Present Illness; otherwise all other systems are reviewed and negative  Allergies  Ambien and Codeine  Home Medications   Prior to Admission medications   Medication Sig Start Date End Date Taking? Authorizing Provider  aspirin EC 325 MG tablet Take 1 tablet (325 mg total) by mouth every morning. For blood thinner for heart health 10/24/12  Yes Nicole KindredAgnes  I Nwoko, NP  atorvastatin (LIPITOR) 40 MG tablet Take 1 tablet (40 mg total) by mouth daily. For cholesterol 10/24/12  Yes Sanjuana Kava, NP  buPROPion (WELLBUTRIN XL) 300 MG 24 hr tablet Take 1 tablet (300 mg total) by mouth daily. For depression 10/24/12  Yes Sanjuana Kava, NP  hydrOXYzine (ATARAX/VISTARIL) 25 MG tablet Take 1 tablet (25 mg total) by mouth every 6 (six) hours as needed for anxiety. 10/24/12  Yes Sanjuana Kava, NP  LORazepam (ATIVAN) 1 MG tablet Take 1 mg by mouth 2 (two) times daily.   Yes Historical Provider, MD  metoprolol succinate (TOPROL-XL) 25 MG 24 hr tablet Take 25 mg by mouth daily.   Yes Historical Provider, MD  mirtazapine (REMERON) 30 MG tablet Take 1 tablet (30 mg total) by mouth at bedtime. For depression/sleep 10/24/12  Yes Sanjuana Kava, NP  QUEtiapine (SEROQUEL XR) 50  MG TB24 24 hr tablet Take 200 mg by mouth at bedtime.   Yes Historical Provider, MD  nitroGLYCERIN (NITROSTAT) 0.4 MG SL tablet Place 1 tablet (0.4 mg total) under the tongue every 5 (five) minutes as needed for chest pain (CP or SOB). 10/24/12   Sanjuana Kava, NP   BP 129/67  Temp(Src) 98 F (36.7 C) (Oral)  Resp 21  Ht 5\' 10"  (1.778 m)  Wt 195 lb (88.451 kg)  BMI 27.98 kg/m2  SpO2 94% Physical Exam  Nursing note and vitals reviewed. Constitutional: He is oriented to person, place, and time. He appears well-developed and well-nourished.  HENT:  Head: Normocephalic and atraumatic.  Nose: Nose normal.  Mouth/Throat: Oropharynx is clear and moist.  Eyes: Conjunctivae and EOM are normal. Pupils are equal, round, and reactive to light.  Neck: Normal range of motion. Neck supple. No JVD present. No tracheal deviation present. No thyromegaly present.  Cardiovascular: Normal rate, regular rhythm, normal heart sounds and intact distal pulses.  Exam reveals no gallop and no friction rub.   No murmur heard. Tachycardia  Pulmonary/Chest: Effort normal. No stridor. No respiratory distress. He has no wheezes. He has no rales. He exhibits no tenderness.  Patient has poor air movement bilaterally  Abdominal: Soft. Bowel sounds are normal. He exhibits no distension and no mass. There is no tenderness. There is no rebound and no guarding.  Musculoskeletal: Normal range of motion. He exhibits no edema and no tenderness.  Lymphadenopathy:    He has no cervical adenopathy.  Neurological: He is alert and oriented to person, place, and time. He exhibits normal muscle tone. Coordination normal.  Skin: Skin is warm and dry. No rash noted. No erythema. No pallor.  Psychiatric: His behavior is normal. Judgment and thought content normal.  Anxiety    ED Course  Procedures (including critical care time) Labs Review Labs Reviewed  CBC - Abnormal; Notable for the following:    Hemoglobin 11.7 (*)    HCT  35.7 (*)    RDW 15.7 (*)    All other components within normal limits  COMPREHENSIVE METABOLIC PANEL - Abnormal; Notable for the following:    Glucose, Bld 101 (*)    BUN 5 (*)    AST 51 (*)    Total Bilirubin <0.2 (*)    Anion gap 17 (*)    All other components within normal limits  LIPASE, BLOOD - Abnormal; Notable for the following:    Lipase 75 (*)    All other components within normal limits  ETHANOL - Abnormal; Notable for the following:  Alcohol, Ethyl (B) 305 (*)    All other components within normal limits  I-STAT CHEM 8, ED - Abnormal; Notable for the following:    Sodium 136 (*)    BUN <3 (*)    Glucose, Bld 101 (*)    Calcium, Ion 1.12 (*)    All other components within normal limits  TROPONIN I  URINE RAPID DRUG SCREEN (HOSP PERFORMED)    Imaging Review Ct Angio Chest Pe W/cm &/or Wo Cm  08/09/2013   CLINICAL DATA:  Three days of chest pain, shortness of breath and tachycardia.  EXAM: CT ANGIOGRAPHY CHEST WITH CONTRAST  TECHNIQUE: Multidetector CT imaging of the chest was performed using the standard protocol during bolus administration of intravenous contrast. Multiplanar CT image reconstructions and MIPs were obtained to evaluate the vascular anatomy.  CONTRAST:  OMNIPAQUE IOHEXOL 350 MG/ML SOLN  COMPARISON:  Chest radiograph August 09, 2013 and chest radiograph September 19, 2012  FINDINGS: Suboptimal pulmonary arterial contrast opacification (181 Hounsfield units, optimum is 250 Hounsfield units or greater). Main pulmonary artery is not enlarged. No pulmonary arterial filling defects to at least the segmental branches, limited assessment for subsegmental emboli due to bolus timing.  The heart is mildly enlarged, pericardium is unremarkable. Ascending aorta is 4 cm in transaxial dimension. Aorta is normal in caliber with mild calcific atherosclerosis of aortic arch. Eccentric intimal thickening resultant 50% narrowing of the origin of the left subclavian artery. No  lymphadenopathy by CT size criteria.  Heterogeneous lung attenuation. Pulmonary venous congestion. Dependent atelectasis. No pleural effusions or focal consolidations. Tracheobronchial tree is patent and midline. No pneumothorax.  Partially imaged exophytic presumed proteinaceous cyst measuring 18 mm arising from upper pole of right kidney. Stable single level moderate upper thoracic compression fracture. Scattered Schmorl's nodes.  Review of the MIP images confirms the above findings.  IMPRESSION: Suboptimal pulmonary arterial bolus timing, without convincing evidence of pulmonary embolism to at least the segmental branches.  Cardiomegaly, pulmonary vascular congestion. Heterogeneous lung attenuation suggest pulmonary edema without focal consolidation or pleural effusion.  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   Electronically Signed   By: Awilda Metro   On: 08/09/2013 06:26   Dg Chest Portable 1 View  08/09/2013   CLINICAL DATA:  Chest pain.  EXAM: PORTABLE CHEST - 1 VIEW  COMPARISON:  Chest radiograph from 09/19/2012  FINDINGS: The lungs are well-aerated and clear. There is no evidence of focal opacification, pleural effusion or pneumothorax.  The cardiomediastinal silhouette is borderline normal in size. No acute osseous abnormalities are seen. A right shoulder arthroplasty is partially imaged.  IMPRESSION: No acute cardiopulmonary process seen.   Electronically Signed   By: Roanna Raider M.D.   On: 08/09/2013 01:38     EKG Interpretation   Date/Time:  Saturday August 09 2013 01:08:52 EDT Ventricular Rate:  110 PR Interval:  194 QRS Duration: 98 QT Interval:  343 QTC Calculation: 464 R Axis:   -65 Text Interpretation:  Sinus or ectopic atrial tachycardia Probable  inferior infarct, old Since last tracing rate faster Confirmed by Teyanna Thielman   MD, Maahir Horst ([id number]) on 08/09/2013 1:37:37 AM      MDM   Final  diagnoses:  Chest pain, unspecified chest pain type  Sinus tachycardia  Anxiety  Alcohol abuse    63 year old male with 3 days of chest pain history of coronary disease.  Patient also has history of anxiety and  polysubstance abuse.  Patient is tachycardic, and is having some dyspnea.  I am concern about PE.  Plan for CT anterior chest.  Labs sent.  EKG with sinus tachycardia without ischemia.    Olivia Mackie, MD 08/09/13 [phone]

## 2013-08-09 NOTE — ED Notes (Signed)
Pt has experienced CP x 72 hrs; describes center of chest, heaviness, does not radiate.  Hx angina.

## 2013-08-11 ENCOUNTER — Emergency Department (HOSPITAL_COMMUNITY)
Admission: EM | Admit: 2013-08-11 | Discharge: 2013-08-12 | Disposition: A | Discharge status: Other Acute Inpatient Hospital | Payer: Medicare Other | Source: Home / Self Care | Attending: Emergency Medicine | Admitting: Emergency Medicine

## 2013-08-11 ENCOUNTER — Encounter (HOSPITAL_COMMUNITY): Payer: Self-pay | Admitting: Emergency Medicine

## 2013-08-11 DIAGNOSIS — F10230 Alcohol dependence with withdrawal, uncomplicated: Secondary | ICD-10-CM

## 2013-08-11 DIAGNOSIS — F1020 Alcohol dependence, uncomplicated: Secondary | ICD-10-CM | POA: Diagnosis not present

## 2013-08-11 LAB — COMPREHENSIVE METABOLIC PANEL
ALBUMIN: 3.9 g/dL (ref 3.5–5.2)
ALK PHOS: 105 U/L (ref 39–117)
ALT: 31 U/L (ref 0–53)
ANION GAP: 20 — AB (ref 5–15)
AST: 42 U/L — ABNORMAL HIGH (ref 0–37)
BILIRUBIN TOTAL: 0.3 mg/dL (ref 0.3–1.2)
BUN: 5 mg/dL — AB (ref 6–23)
CO2: 17 mEq/L — ABNORMAL LOW (ref 19–32)
CREATININE: 0.86 mg/dL (ref 0.50–1.35)
Calcium: 9.3 mg/dL (ref 8.4–10.5)
Chloride: 97 mEq/L (ref 96–112)
GFR calc non Af Amer: >90 mL/min (ref >90)
GLUCOSE: 90 mg/dL (ref 70–99)
POTASSIUM: 4.7 meq/L (ref 3.7–5.3)
Sodium: 134 mEq/L — ABNORMAL LOW (ref 137–147)
Total Protein: 7.5 g/dL (ref 6.0–8.3)

## 2013-08-11 LAB — CBC WITH DIFFERENTIAL/PLATELET
BASOS PCT: 0 % (ref 0–1)
Basophils Absolute: 0 10*3/uL (ref 0.0–0.1)
Eosinophils Absolute: 0.5 10*3/uL (ref 0.0–0.7)
Eosinophils Relative: 8 % — ABNORMAL HIGH (ref 0–5)
HEMATOCRIT: 37.6 % — AB (ref 39.0–52.0)
HEMOGLOBIN: 12.3 g/dL — AB (ref 13.0–17.0)
LYMPHS ABS: 2.1 10*3/uL (ref 0.7–4.0)
Lymphocytes Relative: 34 % (ref 12–46)
MCH: 26.7 pg (ref 26.0–34.0)
MCHC: 32.7 g/dL (ref 30.0–36.0)
MCV: 81.6 fL (ref 78.0–100.0)
MONO ABS: 0.6 10*3/uL (ref 0.1–1.0)
MONOS PCT: 9 % (ref 3–12)
NEUTROS ABS: 3 10*3/uL (ref 1.7–7.7)
Neutrophils Relative %: 49 % (ref 43–77)
Platelets: 186 10*3/uL (ref 150–400)
RBC: 4.61 MIL/uL (ref 4.22–5.81)
RDW: 16 % — ABNORMAL HIGH (ref 11.5–15.5)
WBC: 6.2 10*3/uL (ref 4.0–10.5)

## 2013-08-11 LAB — BASIC METABOLIC PANEL
Anion gap: 13 (ref 5–15)
BUN: 7 mg/dL (ref 6–23)
CALCIUM: 8.4 mg/dL (ref 8.4–10.5)
CHLORIDE: 103 meq/L (ref 96–112)
CO2: 21 mEq/L (ref 19–32)
Creatinine, Ser: 1.17 mg/dL (ref 0.50–1.35)
GFR calc Af Amer: 76 mL/min — ABNORMAL LOW (ref >90)
GFR calc non Af Amer: 66 mL/min — ABNORMAL LOW (ref >90)
Glucose, Bld: 85 mg/dL (ref 70–99)
Potassium: 4.2 mEq/L (ref 3.7–5.3)
Sodium: 137 mEq/L (ref 137–147)

## 2013-08-11 LAB — RAPID URINE DRUG SCREEN, HOSP PERFORMED
Amphetamines: NOT DETECTED
Barbiturates: NOT DETECTED
Benzodiazepines: NOT DETECTED
Cocaine: NOT DETECTED
OPIATES: NOT DETECTED
TETRAHYDROCANNABINOL: NOT DETECTED

## 2013-08-11 LAB — ETHANOL: ALCOHOL ETHYL (B): 142 mg/dL — AB (ref 0–11)

## 2013-08-11 LAB — ACETAMINOPHEN LEVEL: Acetaminophen (Tylenol), Serum: <15 ug/mL (ref 10–30)

## 2013-08-11 LAB — SALICYLATE LEVEL: Salicylate Lvl: <2 mg/dL — ABNORMAL LOW (ref 2.8–20.0)

## 2013-08-11 MED ORDER — LORAZEPAM 1 MG PO TABS
2.0000 mg | ORAL_TABLET | Freq: Once | ORAL | Status: AC
  Administered 2013-08-11: 2 mg via ORAL
  Filled 2013-08-11: qty 2

## 2013-08-11 MED ORDER — QUETIAPINE FUMARATE ER 200 MG PO TB24
200.0000 mg | ORAL_TABLET | Freq: Every day | ORAL | Status: DC
  Administered 2013-08-11: 200 mg via ORAL
  Filled 2013-08-11: qty 1

## 2013-08-11 MED ORDER — ATORVASTATIN CALCIUM 40 MG PO TABS
40.0000 mg | ORAL_TABLET | Freq: Every day | ORAL | Status: DC
  Administered 2013-08-11: 40 mg via ORAL
  Filled 2013-08-11: qty 1

## 2013-08-11 MED ORDER — LORAZEPAM 1 MG PO TABS: 0.0000 mg | ORAL_TABLET | Freq: Two times a day (BID) | ORAL | Status: DC

## 2013-08-11 MED ORDER — ASPIRIN EC 325 MG PO TBEC: 325.0000 mg | DELAYED_RELEASE_TABLET | Freq: Every morning | ORAL | Status: DC

## 2013-08-11 MED ORDER — LORAZEPAM 1 MG PO TABS
1.0000 mg | ORAL_TABLET | Freq: Once | ORAL | Status: AC
  Administered 2013-08-11: 1 mg via ORAL
  Filled 2013-08-11: qty 1

## 2013-08-11 MED ORDER — LORAZEPAM 1 MG PO TABS
0.0000 mg | ORAL_TABLET | Freq: Four times a day (QID) | ORAL | Status: DC
  Administered 2013-08-11: 1 mg via ORAL
  Administered 2013-08-11 (×2): 2 mg via ORAL
  Filled 2013-08-11: qty 1
  Filled 2013-08-11 (×2): qty 2

## 2013-08-11 MED ORDER — BUPROPION HCL ER (XL) 300 MG PO TB24
300.0000 mg | ORAL_TABLET | Freq: Every day | ORAL | Status: DC
  Administered 2013-08-11: 300 mg via ORAL
  Filled 2013-08-11: qty 1

## 2013-08-11 MED ORDER — VITAMIN B-1 100 MG PO TABS
100.0000 mg | ORAL_TABLET | Freq: Every day | ORAL | Status: DC
  Administered 2013-08-11: 100 mg via ORAL
  Filled 2013-08-11: qty 1

## 2013-08-11 MED ORDER — SODIUM CHLORIDE 0.9 % IV BOLUS (SEPSIS): 1000.0000 mL | Freq: Once | INTRAVENOUS | Status: DC

## 2013-08-11 MED ORDER — SODIUM CHLORIDE 0.9 % IV BOLUS (SEPSIS)
2000.0000 mL | Freq: Once | INTRAVENOUS | Status: AC
  Administered 2013-08-11: 2000 mL via INTRAVENOUS

## 2013-08-11 MED ORDER — THIAMINE HCL 100 MG/ML IJ SOLN: 100.0000 mg | Freq: Every day | INTRAMUSCULAR | Status: DC

## 2013-08-11 MED ORDER — METOPROLOL SUCCINATE ER 25 MG PO TB24
25.0000 mg | ORAL_TABLET | Freq: Every day | ORAL | Status: DC
  Administered 2013-08-11: 25 mg via ORAL
  Filled 2013-08-11: qty 1

## 2013-08-11 MED ORDER — HYDROXYZINE HCL 25 MG PO TABS
25.0000 mg | ORAL_TABLET | Freq: Four times a day (QID) | ORAL | Status: DC | PRN
  Administered 2013-08-11: 25 mg via ORAL
  Filled 2013-08-11: qty 1

## 2013-08-11 NOTE — ED Notes (Signed)
[facility]row[facility]

## 2013-08-11 NOTE — ED Provider Notes (Signed)
CSN: [id number]     Arrival date & time 08/11/13  1205 History   First MD Initiated Contact with Patient 08/11/13 1332     Chief Complaint  Patient presents with  . detox     ETOH     (Consider location/radiation/quality/duration/timing/severity/associated sxs/prior Treatment) The history is provided by the patient.  [name] is a 63 y.o. male hx of anxiety, HTN, HL here with alcohol dependence. He has been binge drinking for the last 2 weeks. Drinks about 24 beers daily along with wine. He wants to quit. Has hx of alcohol withdrawal and had alcohol withdrawal seizure once. Was here 2 days ago for chest pain and was ruled out in the ED. Denies further chest pain. Last drink was 10 AM. Denies other ingestions. States that he is feeling shaky now.    Past Medical History  Diagnosis Date  . MVP (mitral valve prolapse)   . Anxiety   . Hypertension   . Hyperlipidemia   . Palpitations   . Bipolar disorder   . Depression   . Esophagitis   . Gastritis   . Insomnia   . Seizures   . Pneumonia 2005  . Arthritis   . Hypercholesteremia   . Coronary artery disease     a.  s/p PCI to RCA in 2007;  b.  Last LHC 04/2008:  LM with irregularities, LAD 50%, mid LAD 50%, proximal D1 80-90%, proximal OM1 30-40%, proximal OM2 50%, PLB branch 30-40%, site of prior angioplasty in the RCA widely patent, EF 65%=>Med Rx;  c. Nuclear study a 12/2011: EF 71% and normal perfusion.    Marland Kitchen History of echocardiogram     Echo 08/16/09: Mild LVH, EF 55-60%, no normal wall motion, grade 1 diastolic dysfunction   Past Surgical History  Procedure Laterality Date  . Coronary angioplasty  2007    RCA  . Cardiac catheterization  04/2008    EF 65%  . Knee arthroscopy, medial patello femoral ligament repair    . Hip arthroplasty  bilateral   . Shoulder arthroscopy  right  . Tonsillectomy    . Total hip revision  02/07/2012    Procedure: TOTAL HIP REVISION;  Surgeon: [name], MD;  Location: [facility] OR;  Service:  Orthopedics;  Laterality: Left;  [place] Hip Revision Set  . Right knee surgery     Family History  Problem Relation Age of Onset  . Heart disease Neg Hx   . Prostate cancer Father    History  Substance Use Topics  . Smoking status: Former Smoker -- 0.25 packs/day for 15 years    Types: Cigarettes    Quit date: 10/19/2012  . Smokeless tobacco: Not on file  . Alcohol Use: 0.0 oz/week    Review of Systems  Neurological:       Tremors   All other systems reviewed and are negative.     Allergies  Ambien and Codeine  Home Medications   Prior to Admission medications   Medication Sig Start Date End Date Taking? Authorizing Provider  aspirin EC 325 MG tablet Take 1 tablet (325 mg total) by mouth every morning. For blood thinner for heart health 10/24/12  Yes Sanjuana Kava, NP  atorvastatin (LIPITOR) 40 MG tablet Take 1 tablet (40 mg total) by mouth daily. For cholesterol 10/24/12  Yes Sanjuana Kava, NP  buPROPion (WELLBUTRIN XL) 300 MG 24 hr tablet Take 1 tablet (300 mg total) by mouth daily. For depression 10/24/12  Yes  Sanjuana KavaAgnes I Nwoko, NP  hydrOXYzine (ATARAX/VISTARIL) 25 MG tablet Take 1 tablet (25 mg total) by mouth every 6 (six) hours as needed for anxiety. 10/24/12  Yes Sanjuana KavaAgnes I Nwoko, NP  LORazepam (ATIVAN) 1 MG tablet Take 1 mg by mouth 2 (two) times daily.   Yes Historical Provider, MD  metoprolol succinate (TOPROL-XL) 25 MG 24 hr tablet Take 25 mg by mouth daily.   Yes Historical Provider, MD  nitroGLYCERIN (NITROSTAT) 0.4 MG SL tablet Place 1 tablet (0.4 mg total) under the tongue every 5 (five) minutes as needed for chest pain (CP or SOB). 10/24/12  Yes Sanjuana KavaAgnes I Nwoko, NP  QUEtiapine (SEROQUEL XR) 50 MG TB24 24 hr tablet Take 200 mg by mouth at bedtime.   Yes Historical Provider, MD   BP 146/98  Pulse 104  Temp(Src) 98.3 F (36.8 C) (Oral)  Resp 18  SpO2 95% Physical Exam  Nursing note and vitals reviewed. Constitutional: He is oriented to person, place, and time.   Smells of alcohol.   HENT:  Head: Normocephalic.  Mouth/Throat: Oropharynx is clear and moist.  Eyes: Conjunctivae and EOM are normal. Pupils are equal, round, and reactive to light.  Neck: Normal range of motion. Neck supple.  Cardiovascular: Regular rhythm and normal heart sounds.   Borderline tachy   Pulmonary/Chest: Effort normal. No respiratory distress. He has no wheezes. He has no rales.  Abdominal: Soft. Bowel sounds are normal. He exhibits no distension. There is no tenderness. There is no rebound and no guarding.  Musculoskeletal: Normal range of motion. He exhibits no edema and no tenderness.  Neurological: He is alert and oriented to person, place, and time.  Nl strength throughout. CN 2-12 intact. Some resting tremors.   Skin: Skin is warm and dry.  Psychiatric: He has a normal mood and affect. His behavior is normal. Judgment and thought content normal.    ED Course  Procedures (including critical care time) Labs Review Labs Reviewed  CBC WITH DIFFERENTIAL - Abnormal; Notable for the following:    Hemoglobin 12.3 (*)    HCT 37.6 (*)    RDW 16.0 (*)    Eosinophils Relative 8 (*)    All other components within normal limits  COMPREHENSIVE METABOLIC PANEL - Abnormal; Notable for the following:    Sodium 134 (*)    CO2 17 (*)    BUN 5 (*)    AST 42 (*)    Anion gap 20 (*)    All other components within normal limits  ETHANOL - Abnormal; Notable for the following:    Alcohol, Ethyl (B) 142 (*)    All other components within normal limits  SALICYLATE LEVEL - Abnormal; Notable for the following:    Salicylate Lvl <2.0 (*)    All other components within normal limits  ACETAMINOPHEN LEVEL  URINE RAPID DRUG SCREEN (HOSP PERFORMED)    Imaging Review No results found.   EKG Interpretation None      MDM   Final diagnoses:  None   [name] is a 63 y.o. male here with alcohol dependence. He is alert, showing mild withdrawal symptoms. ETOH was 142 but he is  likely much higher at baseline. On CMP, bicarb 17 with AG 20. I think likely from dehydration. He denies other ingestions and I doubt toxic alcohol ingestion. Will get NS 2L bolus and repeat BMP then can medically clear. Will start on CIWA and start home meds. Signed out to Dr. Criss AlvineGoldston in the ED.  Richardean Canal, MD 08/11/13 [phone]

## 2013-08-11 NOTE — BH Assessment (Signed)
Assessment Note  [name] is an 63 y.o. male. Patient was brought into the ED by a friend because of the need for alcohol detox.  Patient reports an 8 month period of sobriety until 3 weeks ago.  For the last past 3 weeks he reports drinking a case beer daily.  Patient reporting current withdrawal symptoms such as shakes, tremors, increased anxiety, sensitivity to light, and increased depression.  Patient currently denies SI/HI, hallucinations, and other self-injurious behaviors.  Patient reports his current stressors are ending of relationship, anticipated settlement, older brother missing for 3 years, and possible need for medical treatment.  Patient denies history of DT, alcohol related seizures, or blackouts.    CSW ran patient with Catha Nottingham, NP it is recommended for inpt detox once medically cleared.  Per Tresa Endo, [facility] patient accepted to [facility] 300-1 once medically cleared.     Axis I: Alcohol Dependence, Mood D/o NOS Axis II: Deferred Axis III:  Past Medical History  Diagnosis Date  . MVP (mitral valve prolapse)   . Anxiety   . Hypertension   . Hyperlipidemia   . Palpitations   . Bipolar disorder   . Depression   . Esophagitis   . Gastritis   . Insomnia   . Seizures   . Pneumonia 2005  . Arthritis   . Hypercholesteremia   . Coronary artery disease     a.  s/p PCI to RCA in 2007;  b.  Last LHC 04/2008:  LM with irregularities, LAD 50%, mid LAD 50%, proximal D1 80-90%, proximal OM1 30-40%, proximal OM2 50%, PLB branch 30-40%, site of prior angioplasty in the RCA widely patent, EF 65%=>Med Rx;  c. Nuclear study a 12/2011: EF 71% and normal perfusion.    Marland Kitchen History of echocardiogram     Echo 08/16/09: Mild LVH, EF 55-60%, no normal wall motion, grade 1 diastolic dysfunction   Axis IV: other psychosocial or environmental problems, problems related to social environment and problems with primary support group Axis V: 41-50 serious symptoms  Past Medical History:  Past Medical History   Diagnosis Date  . MVP (mitral valve prolapse)   . Anxiety   . Hypertension   . Hyperlipidemia   . Palpitations   . Bipolar disorder   . Depression   . Esophagitis   . Gastritis   . Insomnia   . Seizures   . Pneumonia 2005  . Arthritis   . Hypercholesteremia   . Coronary artery disease     a.  s/p PCI to RCA in 2007;  b.  Last LHC 04/2008:  LM with irregularities, LAD 50%, mid LAD 50%, proximal D1 80-90%, proximal OM1 30-40%, proximal OM2 50%, PLB branch 30-40%, site of prior angioplasty in the RCA widely patent, EF 65%=>Med Rx;  c. Nuclear study a 12/2011: EF 71% and normal perfusion.    Marland Kitchen History of echocardiogram     Echo 08/16/09: Mild LVH, EF 55-60%, no normal wall motion, grade 1 diastolic dysfunction    Past Surgical History  Procedure Laterality Date  . Coronary angioplasty  2007    RCA  . Cardiac catheterization  04/2008    EF 65%  . Knee arthroscopy, medial patello femoral ligament repair    . Hip arthroplasty  bilateral   . Shoulder arthroscopy  right  . Tonsillectomy    . Total hip revision  02/07/2012    Procedure: TOTAL HIP REVISION;  Surgeon: [name], MD;  Location: [facility] OR;  Service: Orthopedics;  Laterality:  Left;  [place] Hip Revision Set  . Right knee surgery      Family History:  Family History  Problem Relation Age of Onset  . Heart disease Neg Hx   . Prostate cancer Father     Social History:  reports that he quit smoking about 9 months ago. His smoking use included Cigarettes. He has a 3.75 pack-year smoking history. He does not have any smokeless tobacco history on file. He reports that he drinks alcohol. He reports that he does not use illicit drugs.  Additional Social History:     CIWA: CIWA-Ar BP: 155/88 mmHg Pulse Rate: 102 Nausea and Vomiting: no nausea and no vomiting Tactile Disturbances: very mild itching, pins and needles, burning or numbness Tremor: five Auditory Disturbances: not present Paroxysmal Sweats: three Visual  Disturbances: not present Anxiety: two Headache, Fullness in Head: none present Agitation: normal activity Orientation and Clouding of Sensorium: oriented and can do serial additions CIWA-Ar Total: 11 COWS:    Allergies:  Allergies  Allergen Reactions  . Ambien [Zolpidem Tartrate] Other (See Comments)    Sleep Walking  . Codeine Itching    Home Medications:  (Not in a hospital admission)  OB/GYN Status:  No LMP for male patient.  General Assessment Data Location of Assessment: [facility] ED ACT Assessment: Yes Is this a Tele or Face-to-Face Assessment?: Face-to-Face Is this an Initial Assessment or a Re-assessment for this encounter?: Initial Assessment Living Arrangements: Alone Can pt return to current living arrangement?: Yes Admission Status: Voluntary Is patient capable of signing voluntary admission?: Yes Transfer from: Home Referral Source: Self/Family/Friend  Medical Screening Exam [facility] Walk-in ONLY) Medical Exam completed: [facility]  [facility] Living Arrangements: Alone Name of Psychiatrist: [facility]  Education Status Is patient currently in school?: No  Risk to self Suicidal Ideation: No-Not Currently/Within Last 6 Months Suicidal Intent: No-Not Currently/Within Last 6 Months Is patient at risk for suicide?: No Suicidal Plan?: No-Not Currently/Within Last 6 Months Access to Means: No What has been your use of drugs/alcohol within the last 12 months?: Alcohol Previous Attempts/Gestures: No Intentional Self Injurious Behavior: None Family Suicide History: No Recent stressful life event(s): Conflict (Comment);Loss (Comment);Legal Issues Persecutory voices/beliefs?: No Depression: Yes Depression Symptoms: Isolating;Fatigue;Loss of interest in usual pleasures Substance abuse history and/or treatment for substance abuse?: Yes  Risk to Others Homicidal Ideation: No-Not Currently/Within Last 6 Months Thoughts of Harm to Others: No-Not Currently  Present/Within Last 6 Months Current Homicidal Intent: No-Not Currently/Within Last 6 Months Current Homicidal Plan: No-Not Currently/Within Last 6 Months Access to Homicidal Means: No History of harm to others?: No Assessment of Violence: None Noted Does patient have access to weapons?: No Does patient have a court date: No  Psychosis Hallucinations: None noted Delusions: None noted  Mental Status Report Appear/Hygiene: In hospital gown Eye Contact: Good Motor Activity: Freedom of movement Speech: Logical/coherent Level of Consciousness: Alert Mood: Anxious Affect: Depressed Anxiety Level: Moderate Thought Processes: Coherent Judgement: Unimpaired Orientation: Person;Place;Situation;Time Obsessive Compulsive Thoughts/Behaviors: None  Cognitive Functioning Concentration: Fair Memory: Recent Intact;Remote Intact IQ: Average Insight: Good Impulse Control: Good Appetite: Good Sleep: No Change Vegetative Symptoms: None  ADLScreening [facility] Assessment Services) Patient's cognitive ability adequate to safely complete daily activities?: Yes Patient able to express need for assistance with ADLs?: Yes Independently performs ADLs?: Yes (appropriate for developmental age)  Prior Inpatient Therapy Prior Inpatient Therapy: Yes [facility] Reason for Treatment: detox  Prior Outpatient Therapy Prior Outpatient Therapy: Yes Prior Therapy Facilty[facility]Provider([facility] Reason for Treatment: Alcohol  ADL Screening (condition at time of admission) Patient's cognitive ability adequate to safely complete daily activities?: Yes Patient able to express need for assistance with ADLs?: Yes Independently performs ADLs?: Yes (appropriate for developmental age)         Values / Beliefs Cultural Requests During Hospitalization: None Spiritual Requests During Hospitalization: None        Additional Information 1:1 In Past 12 Months?:  No CIRT Risk: No Elopement Risk: No Does patient have medical clearance?: Yes     Disposition:  Disposition Initial Assessment Completed for this Encounter: Yes Disposition of Patient: Inpatient treatment program Type of inpatient treatment program: Adult  On Site Evaluation by:   Reviewed with Physician:    Maryelizabeth Rowan A 08/11/2013 6:02 PM

## 2013-08-11 NOTE — ED Notes (Signed)
Pt states that he needs detox from alcohol. He has been a drinking binge for about 14 days.

## 2013-08-11 NOTE — ED Notes (Signed)
One bag of belongings placed in locker 26.

## 2013-08-11 NOTE — ED Provider Notes (Signed)
Patient accepted to behavioral health. Accepting physician is Dr. Dub MikesLugo.  [name] CamelScott T Ishia Tenorio, MD 08/11/13 2136

## 2013-08-11 NOTE — ED Notes (Signed)
Pt belongings moved to locker 30 

## 2013-08-11 NOTE — Progress Notes (Signed)
CSW and patient reviewed the voluntary paperwork and he willingly signed for admission.  All documentation was given to the nursing staff to complete the transfer process.     Maryelizabeth Rowanressa Cherine Drumgoole, MSW, [place] Terrace[place]CSWA, 08/11/2013 Evening Clinical Social Worker [phone]

## 2013-08-11 NOTE — ED Provider Notes (Signed)
7:15 PM BMP has improved, gap has closed. Medically cleared  [name] T Kaelani Kendrick, MD 08/11/13 [phone]

## 2013-08-12 ENCOUNTER — Encounter (HOSPITAL_COMMUNITY): Payer: Self-pay

## 2013-08-12 ENCOUNTER — Inpatient Hospital Stay (HOSPITAL_COMMUNITY)
Admission: EM | Admit: 2013-08-12 | Discharge: 2013-08-15 | DRG: 897 | Disposition: A | Discharge status: Home / Self Care | Payer: Medicare Other | Source: Intra-hospital | Attending: Psychiatry | Admitting: Psychiatry

## 2013-08-12 DIAGNOSIS — E7800 Pure hypercholesterolemia, unspecified: Secondary | ICD-10-CM | POA: Diagnosis present

## 2013-08-12 DIAGNOSIS — K219 Gastro-esophageal reflux disease without esophagitis: Secondary | ICD-10-CM | POA: Diagnosis present

## 2013-08-12 DIAGNOSIS — I059 Rheumatic mitral valve disease, unspecified: Secondary | ICD-10-CM | POA: Diagnosis present

## 2013-08-12 DIAGNOSIS — F419 Anxiety disorder, unspecified: Secondary | ICD-10-CM

## 2013-08-12 DIAGNOSIS — Z7982 Long term (current) use of aspirin: Secondary | ICD-10-CM

## 2013-08-12 DIAGNOSIS — F10230 Alcohol dependence with withdrawal, uncomplicated: Secondary | ICD-10-CM

## 2013-08-12 DIAGNOSIS — Z79899 Other long term (current) drug therapy: Secondary | ICD-10-CM

## 2013-08-12 DIAGNOSIS — F32A Depression, unspecified: Secondary | ICD-10-CM

## 2013-08-12 DIAGNOSIS — F17200 Nicotine dependence, unspecified, uncomplicated: Secondary | ICD-10-CM | POA: Diagnosis present

## 2013-08-12 DIAGNOSIS — G4700 Insomnia, unspecified: Secondary | ICD-10-CM | POA: Diagnosis present

## 2013-08-12 DIAGNOSIS — I2510 Atherosclerotic heart disease of native coronary artery without angina pectoris: Secondary | ICD-10-CM | POA: Diagnosis present

## 2013-08-12 DIAGNOSIS — Z8659 Personal history of other mental and behavioral disorders: Secondary | ICD-10-CM

## 2013-08-12 DIAGNOSIS — M129 Arthropathy, unspecified: Secondary | ICD-10-CM | POA: Diagnosis present

## 2013-08-12 DIAGNOSIS — F411 Generalized anxiety disorder: Secondary | ICD-10-CM | POA: Diagnosis present

## 2013-08-12 DIAGNOSIS — F1014 Alcohol abuse with alcohol-induced mood disorder: Secondary | ICD-10-CM | POA: Diagnosis present

## 2013-08-12 DIAGNOSIS — F332 Major depressive disorder, recurrent severe without psychotic features: Secondary | ICD-10-CM | POA: Diagnosis present

## 2013-08-12 DIAGNOSIS — Z8042 Family history of malignant neoplasm of prostate: Secondary | ICD-10-CM | POA: Diagnosis not present

## 2013-08-12 DIAGNOSIS — E785 Hyperlipidemia, unspecified: Secondary | ICD-10-CM | POA: Diagnosis present

## 2013-08-12 DIAGNOSIS — Z9861 Coronary angioplasty status: Secondary | ICD-10-CM | POA: Diagnosis not present

## 2013-08-12 DIAGNOSIS — F319 Bipolar disorder, unspecified: Secondary | ICD-10-CM | POA: Diagnosis present

## 2013-08-12 DIAGNOSIS — F1020 Alcohol dependence, uncomplicated: Secondary | ICD-10-CM | POA: Diagnosis present

## 2013-08-12 DIAGNOSIS — F329 Major depressive disorder, single episode, unspecified: Secondary | ICD-10-CM

## 2013-08-12 DIAGNOSIS — I10 Essential (primary) hypertension: Secondary | ICD-10-CM | POA: Diagnosis present

## 2013-08-12 DIAGNOSIS — F3173 Bipolar disorder, in partial remission, most recent episode manic: Secondary | ICD-10-CM

## 2013-08-12 MED ORDER — CHLORDIAZEPOXIDE HCL 25 MG PO CAPS
25.0000 mg | ORAL_CAPSULE | ORAL | Status: AC
  Administered 2013-08-14 (×2): 25 mg via ORAL
  Filled 2013-08-12 (×2): qty 1

## 2013-08-12 MED ORDER — ATORVASTATIN CALCIUM 40 MG PO TABS
40.0000 mg | ORAL_TABLET | Freq: Every day | ORAL | Status: DC
  Administered 2013-08-12 – 2013-08-15 (×4): 40 mg via ORAL
  Filled 2013-08-12 (×7): qty 1

## 2013-08-12 MED ORDER — ONDANSETRON 4 MG PO TBDP: 4.0000 mg | ORAL_TABLET | Freq: Four times a day (QID) | ORAL | Status: AC | PRN

## 2013-08-12 MED ORDER — HYDROXYZINE HCL 25 MG PO TABS
25.0000 mg | ORAL_TABLET | Freq: Four times a day (QID) | ORAL | Status: AC | PRN
  Administered 2013-08-14 (×2): 25 mg via ORAL
  Filled 2013-08-12 (×2): qty 1

## 2013-08-12 MED ORDER — ADULT MULTIVITAMIN W/MINERALS CH
1.0000 | ORAL_TABLET | Freq: Every day | ORAL | Status: DC
  Administered 2013-08-12 – 2013-08-15 (×4): 1 via ORAL
  Filled 2013-08-12 (×7): qty 1

## 2013-08-12 MED ORDER — CHLORDIAZEPOXIDE HCL 25 MG PO CAPS
25.0000 mg | ORAL_CAPSULE | Freq: Four times a day (QID) | ORAL | Status: AC | PRN
  Administered 2013-08-14: 25 mg via ORAL
  Filled 2013-08-12: qty 1

## 2013-08-12 MED ORDER — CHLORDIAZEPOXIDE HCL 25 MG PO CAPS
25.0000 mg | ORAL_CAPSULE | Freq: Three times a day (TID) | ORAL | Status: AC
  Administered 2013-08-13 (×3): 25 mg via ORAL
  Filled 2013-08-12 (×5): qty 1

## 2013-08-12 MED ORDER — CHLORDIAZEPOXIDE HCL 25 MG PO CAPS
25.0000 mg | ORAL_CAPSULE | Freq: Every day | ORAL | Status: AC
  Administered 2013-08-15: 25 mg via ORAL
  Filled 2013-08-12: qty 1

## 2013-08-12 MED ORDER — BUPROPION HCL ER (XL) 300 MG PO TB24
300.0000 mg | ORAL_TABLET | Freq: Every day | ORAL | Status: DC
  Administered 2013-08-12 – 2013-08-15 (×4): 300 mg via ORAL
  Filled 2013-08-12 (×7): qty 1

## 2013-08-12 MED ORDER — LOPERAMIDE HCL 2 MG PO CAPS
2.0000 mg | ORAL_CAPSULE | ORAL | Status: AC | PRN
  Administered 2013-08-14: 4 mg via ORAL
  Filled 2013-08-12: qty 2

## 2013-08-12 MED ORDER — ASPIRIN EC 325 MG PO TBEC
325.0000 mg | DELAYED_RELEASE_TABLET | Freq: Every morning | ORAL | Status: DC
  Administered 2013-08-12 – 2013-08-15 (×4): 325 mg via ORAL
  Filled 2013-08-12 (×9): qty 1

## 2013-08-12 MED ORDER — THIAMINE HCL 100 MG/ML IJ SOLN
100.0000 mg | Freq: Once | INTRAMUSCULAR | Status: AC
  Administered 2013-08-12: 100 mg via INTRAMUSCULAR
  Filled 2013-08-12: qty 1

## 2013-08-12 MED ORDER — METOPROLOL SUCCINATE ER 25 MG PO TB24
25.0000 mg | ORAL_TABLET | Freq: Every day | ORAL | Status: DC
  Administered 2013-08-12 – 2013-08-15 (×4): 25 mg via ORAL
  Filled 2013-08-12 (×8): qty 1

## 2013-08-12 MED ORDER — VITAMIN B-1 100 MG PO TABS
100.0000 mg | ORAL_TABLET | Freq: Every day | ORAL | Status: DC
  Administered 2013-08-13: 100 mg via ORAL
  Filled 2013-08-12 (×5): qty 1

## 2013-08-12 MED ORDER — CHLORDIAZEPOXIDE HCL 25 MG PO CAPS
25.0000 mg | ORAL_CAPSULE | Freq: Four times a day (QID) | ORAL | Status: AC
  Administered 2013-08-12 (×4): 25 mg via ORAL
  Filled 2013-08-12 (×3): qty 1

## 2013-08-12 MED ORDER — QUETIAPINE FUMARATE ER 200 MG PO TB24
200.0000 mg | ORAL_TABLET | Freq: Every day | ORAL | Status: DC
  Administered 2013-08-12: 200 mg via ORAL
  Filled 2013-08-12 (×3): qty 1

## 2013-08-12 MED ORDER — ALUM & MAG HYDROXIDE-SIMETH 200-200-20 MG/5ML PO SUSP
30.0000 mL | ORAL | Status: DC | PRN
  Administered 2013-08-13 – 2013-08-14 (×2): 30 mL via ORAL

## 2013-08-12 MED ORDER — NITROGLYCERIN 0.4 MG SL SUBL: 0.4000 mg | SUBLINGUAL_TABLET | SUBLINGUAL | Status: DC | PRN

## 2013-08-12 MED ORDER — ACETAMINOPHEN 325 MG PO TABS: 650.0000 mg | ORAL_TABLET | Freq: Four times a day (QID) | ORAL | Status: DC | PRN

## 2013-08-12 MED ORDER — MAGNESIUM HYDROXIDE 400 MG/5ML PO SUSP: 30.0000 mL | Freq: Every day | ORAL | Status: DC | PRN

## 2013-08-12 NOTE — Tx Team (Signed)
Initial Interdisciplinary Treatment Plan  PATIENT STRENGTHS: (choose at least two) Ability for insight Active sense of humor Average or above average intelligence Capable of independent living Communication skills Financial means General fund of knowledge Motivation for treatment/growth  PATIENT STRESSORS: Substance abuse   PROBLEM LIST: Problem List/Patient Goals Date to be addressed Date deferred Reason deferred Estimated date of resolution  Substance Abuse                                                       DISCHARGE CRITERIA:  Improved stabilization in mood, thinking, and/or behavior  PRELIMINARY DISCHARGE PLAN: Outpatient therapy  PATIENT/FAMIILY INVOLVEMENT: This treatment plan has been presented to and reviewed with the patient, Kandis BanDavid M Millirons, and/or family member.  The patient and family have been given the opportunity to ask questions and make suggestions.  Gretta ArabHerbin, Ezreal Turay [facility] 08/12/2013, 1:23 AM

## 2013-08-12 NOTE — [facility] Group Notes (Signed)
[facility] LCSW Group Therapy  08/12/2013 1:48 PM  Type of Therapy:  Group Therapy  Participation Level:  Did Not Attend-pt in room resting.  Smart, Fujiko Picazo LCSWA 08/12/2013, 1:48 PM

## 2013-08-12 NOTE — [facility] Suicide Risk Assessment (Signed)
[facility] INPATIENT: Family/Significant Other Suicide Prevention Education  Suicide Prevention Education:  Education Completed; No one has been identified by the patient as the family member/significant other with whom the patient will be residing, and identified as the person(s) who will aid the patient in the event of a mental health crisis (suicidal ideations/suicide attempt).   Pt did not c/o SI at admission, nor have they endorsed SI during their stay here. SPE not required. SPI pamphlet provided to pt and he was encouraged to share information with support network, ask questions, and talk about any concerns relating to SPE.  The Sherwin-WilliamsHeather Smart, LCSWA 08/12/2013 9:02 AM

## 2013-08-12 NOTE — Progress Notes (Signed)
The focus of this group is to educate the patient on the purpose and policies of crisis stabilization and provide a format to answer questions about their admission.  The group details unit policies and expectations of patients while admitted.  Patient did not attend 0900 nurse education orientation group this morning.  Patient stayed in room.  

## 2013-08-12 NOTE — Progress Notes (Signed)
Patient ID: [name], male   DOB: [birth year], 63 y.o.   MRN: [id number]  D: Pt informed the writer that after his last visit [facility] Stated he was there 3 wks and was sober from then til 3 wks ago. However, throughout the conversation pt continued to ask pt about her personal life, and had to be redirected. Pt was more guarded with the writer than in the past.  A:  Support and encouragement was offered. 15 min checks continued for safety.  R: Pt remains safe.
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
## 2013-08-12 NOTE — [facility] Suicide Risk Assessment (Signed)
Suicide Risk Assessment  Admission Assessment     Nursing information obtained from:  Patient Demographic factors:  Male;Caucasian;Gay, lesbian, or bisexual orientation;Living alone;Unemployed Current Mental Status:    Loss Factors:  Loss of significant relationship Historical Factors:  Family history of suicide;Family history of mental illness or substance abuse Risk Reduction Factors:  Religious beliefs about death;Positive social support;Positive therapeutic relationship Total Time spent with patient: 45 minutes  CLINICAL FACTORS:   Depression:   Comorbid alcohol abuse/dependence Alcohol/Substance Abuse/Dependencies  Psychiatric Specialty Exam:     Blood pressure 141/103, pulse 83, temperature 98.1 F (36.7 C), temperature source Oral, resp. rate 20, height 5\' 9"  (1.753 m), weight 90.719 kg (200 lb), SpO2 98.00%.Body mass index is 29.52 kg/(m^2).  General Appearance: Disheveled  Eye SolicitorContact::  Fair  Speech:  Clear and Coherent  Volume:  Normal  Mood:  Anxious, Depressed and worried  Affect:  Depressed  Thought Process:  Coherent and Goal Directed  Orientation:  Full (Time, Place, and Person)  Thought Content:  symptoms, worries, concerns  Suicidal Thoughts:  No  Homicidal Thoughts:  No  Memory:  Immediate;   Fair Recent;   Fair Remote;   Fair  Judgement:  Fair  Insight:  Fair  Psychomotor Activity:  Restlessness  Concentration:  Fair  Recall:  FiservFair  Fund of Knowledge:N/A  Language: Fair  Akathisia:  No  Handed:    AIMS (if indicated):     Assets:  Desire for Improvement  Sleep:  Number of Hours: 4   Musculoskeletal: Strength & Muscle Tone: within normal limits Gait & Station: normal Patient leans: N/A  COGNITIVE FEATURES THAT CONTRIBUTE TO RISK:  Closed-mindedness Polarized thinking Thought constriction (tunnel vision)    SUICIDE RISK:   Moderate:  PLAN OF CARE: Supportive approach/coping skills/relapse prevention                               Librium  detox                               Reassess and address the co morbidities  I certify that inpatient services furnished can reasonably be expected to improve the patient's condition.  [name] A 08/12/2013, 7:15 PM

## 2013-08-12 NOTE — Progress Notes (Signed)
Adult Psychoeducational Group Note  Date:  08/12/2013 Time:  10:28 PM  Group Topic/Focus:  Wrap-Up Group:   The focus of this group is to help patients review their daily goal of treatment and discuss progress on daily workbooks.  Participation Level:  Active  Participation Quality:  Appropriate  Affect:  Appropriate  Cognitive:  Alert and Oriented  Insight: Appropriate  Engagement in Group:  Developing/Improving  Modes of Intervention:  Exploration, Problem-solving and Support  Additional Comments:  Patient stated that he was here due to 2 weeks of binge drinking. Patient stated that he learned from his recovery group that he needs to go to more of his AA meetings. Patient stated that he feels a lot better on today.   Yazir Koerber, Randal Bubaerri Lee 08/12/2013, 10:28 PM

## 2013-08-12 NOTE — H&P (Addendum)
Psychiatric Admission Assessment Adult  Patient Identification:  [name]  Date of Evaluation:  08/12/2013  Chief Complaint:  Alcohol Abuse  History of Present Illness: [name] is 63 year old Caucasian male. [facility]. He reports, "A friend took me to the hospital yesterday. I have been drinking excessively x 3 weeks. My drinking is progressively worsening. I drink a case of beer daily. Alcohol relaxes me so I don't stay anxious. I had 8 months sobriety until I had a relationship break up about 3 weeks ago. The bad feeling from the break-up led me to relapse. Anther reason that I stay anxious is because I'm going to be getting a settlement in August, about $250, 000 from a medical malpractice law suit.  I want to make sure to spend this money wisely and properly. I feel tremulous, sweaty, shaky. But, I'm not really depressed. I would like to remain on my current medication regimen".   Elements:  Location:  Alcohol dependence. Quality:  Cravings, shakes, daiphoresis, blurry vision, light sensivity. Severity:  Severe, I have been drinking a case of beer daily x 3 weeks. Timing:  I relapsed 3 weeks ago, my drinking is worsening. Duration:  Chronic, been drinking since 20. Context:  "I have anxiety, I get anxious very easliy. alcohol calms me, recent relationship break-up.  Associated Signs/Synptoms:  Depression Symptoms:  feelings of worthlessness/guilt, anxiety, insomnia, loss of energy/fatigue,  (Hypo) Manic Symptoms:  Impulsivity, Irritable Mood,  Anxiety Symptoms:  Excessive Worry,  Psychotic Symptoms:  Denies  PTSD Symptoms: None  Psychiatric Specialty Exam: Physical Exam  Constitutional: He is oriented to person, place, and time. He appears well-developed.  HENT:  Head: Normocephalic.  Eyes: Pupils are equal, round, and reactive to light.  Neck: Normal range of motion.  Cardiovascular:  Elevated blood pressure  Respiratory: Effort normal and  breath sounds normal.  GI: Soft.  Musculoskeletal: Normal range of motion.  Neurological: He is alert and oriented to person, place, and time.  Skin: Skin is warm and dry.    Review of Systems  Constitutional: Positive for chills, malaise/fatigue and diaphoresis.  HENT: Negative.   Eyes: Positive for blurred vision and photophobia.  Respiratory: Negative.   Cardiovascular: Negative.        Elevated blood pressure  Gastrointestinal: Positive for nausea, abdominal pain and diarrhea.  Genitourinary: Negative.   Musculoskeletal: Positive for myalgias.  Skin: Negative.   Neurological: Positive for dizziness, tremors and weakness.  Endo/Heme/Allergies: Negative.   Psychiatric/Behavioral: Positive for depression and substance abuse (Alcoholism, ). Negative for suicidal ideas, hallucinations and memory loss. The patient is nervous/anxious and has insomnia.     Blood pressure 141/103, pulse 83, temperature 98.1 F (36.7 C), temperature source Oral, resp. rate 20, height _0  (1.753 m), weight 90.719 kg (200 lb), SpO2 98.00%.Body mass index is 29.52 kg/(m^2).  General Appearance: Casual and Fairly Groomed  Eye Contact::  Good  Speech:  Clear and Coherent  Volume:  Normal  Mood:  Anxious and mildly depressed  Affect:  Congruent  Thought Process:  Coherent and Intact  Orientation:  Full (Time, Place, and Person)  Thought Content:  Rumination  Suicidal Thoughts:  No  Homicidal Thoughts:  No  Memory:  Immediate;   Good Recent;   Good Remote;   Good  Judgement:  Fair  Insight:  Fair  Psychomotor Activity:  Restlessness and Tremor  Concentration:  Fair  Recall:  Good  Fund of Knowledge:Fair  Language: Good  Akathisia:  No  Handed:  Right  AIMS (if indicated):     Assets:  Communication Skills Desire for Improvement  Sleep:  Number of Hours: 4   Musculoskeletal: Strength & Muscle Tone: within normal limits Gait & Station: normal Patient leans: N/A  Past Psychiatric  History: Diagnosis: Alcohol dependence  Hospitalizations: [facility] adult unit  Outpatient Care: [facility]  Substance Abuse Care: None reported  Self-Mutilation: Denies  Suicidal Attempts: Denies  Violent Behaviors: NA   Past Medical History:   Past Medical History  Diagnosis Date  . MVP (mitral valve prolapse)   . Anxiety   . Hypertension   . Hyperlipidemia   . Palpitations   . Bipolar disorder   . Depression   . Esophagitis   . Gastritis   . Insomnia   . Seizures   . Pneumonia 2005  . Arthritis   . Hypercholesteremia   . Coronary artery disease     a.  s/p PCI to RCA in 2007;  b.  Last LHC 04/2008:  LM with irregularities, LAD 50%, mid LAD 50%, proximal D1 80-90%, proximal OM1 30-40%, proximal OM2 50%, PLB branch 30-40%, site of prior angioplasty in the RCA widely patent, EF 65%=>Med Rx;  c. Nuclear study a 12/2011: EF 71% and normal perfusion.    Marland Kitchen History of echocardiogram     Echo 08/16/09: Mild LVH, EF 55-60%, no normal wall motion, grade 1 diastolic dysfunction   Seizure History:  HX of Cardiac History:  Hypercholesterolemia, CAD  Allergies:   Allergies  Allergen Reactions  . Ambien [Zolpidem Tartrate] Other (See Comments)    Sleep Walking  . Codeine Itching   PTA Medications: Prescriptions prior to admission  Medication Sig Dispense Refill  . aspirin EC 325 MG tablet Take 1 tablet (325 mg total) by mouth every morning. For blood thinner for heart health  30 tablet  0  . atorvastatin (LIPITOR) 40 MG tablet Take 1 tablet (40 mg total) by mouth daily. For cholesterol  30 tablet  0  . buPROPion (WELLBUTRIN XL) 300 MG 24 hr tablet Take 1 tablet (300 mg total) by mouth daily. For depression  30 tablet  0  . hydrOXYzine (ATARAX/VISTARIL) 25 MG tablet Take 1 tablet (25 mg total) by mouth every 6 (six) hours as needed for anxiety.  60 tablet  0  . LORazepam (ATIVAN) 1 MG tablet Take 1 mg by mouth 2 (two) times daily.      . metoprolol succinate (TOPROL-XL) 25 MG 24 hr tablet  Take 25 mg by mouth daily.      . nitroGLYCERIN (NITROSTAT) 0.4 MG SL tablet Place 1 tablet (0.4 mg total) under the tongue every 5 (five) minutes as needed for chest pain (CP or SOB).  15 tablet  0  . QUEtiapine (SEROQUEL XR) 50 MG TB24 24 hr tablet Take 200 mg by mouth at bedtime.        Previous Psychotropic Medications:  Medication/Dose  See medication lists               Substance Abuse History in the last 12 months:  Yes.    Consequences of Substance Abuse: Medical Consequences:  Liver damage, Possible death by overdose Legal Consequences:  Arrests, jail time, Loss of driving privilege. Family Consequences:  Family discord, divorce and or separation.  Social History:  reports that he quit smoking today. His smoking use included Cigarettes. He has a 22.5 pack-year smoking history. He does not have any smokeless tobacco history on file. He reports  that he drinks alcohol. He reports that he does not use illicit drugs. 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  Legal History: Denies any pending legal charges  Hobbies/Interests: NA  Family History:   Family History  Problem Relation Age of Onset  . Heart disease Neg Hx   . Prostate cancer Father     Results for orders placed during the hospital encounter of 08/11/13 (from the past 72 hour(s))  CBC WITH DIFFERENTIAL     Status: Abnormal   Collection Time    08/11/13  1:53 PM      Result Value Ref Range   WBC 6.2  4.0 - 10.5 K/uL   RBC 4.61  4.22 - 5.81 MIL/uL   Hemoglobin 12.3 (*) 13.0 - 17.0 g/dL   HCT  37.6 (*) 39.0 - 52.0 %   MCV 81.6  78.0 - 100.0 fL   MCH 26.7  26.0 - 34.0 pg   MCHC 32.7  30.0 - 36.0 g/dL   RDW 16.0 (*) 11.5 - 15.5 %   Platelets 186  150 - 400 K/uL   Neutrophils Relative % 49  43 - 77 %   Neutro Abs 3.0  1.7 - 7.7 K/uL   Lymphocytes Relative 34  12 - 46 %   Lymphs Abs 2.1  0.7 - 4.0 K/uL   Monocytes Relative 9  3 - 12 %   Monocytes Absolute 0.6  0.1 - 1.0 K/uL   Eosinophils Relative 8 (*) 0 - 5 %   Eosinophils Absolute 0.5  0.0 - 0.7 K/uL   Basophils Relative 0  0 - 1 %   Basophils Absolute 0.0  0.0 - 0.1 K/uL  COMPREHENSIVE METABOLIC PANEL     Status: Abnormal   Collection Time    08/11/13  1:53 PM      Result Value Ref Range   Sodium 134 (*) 137 - 147 mEq/L   Potassium 4.7  3.7 - 5.3 mEq/L   Chloride 97  96 - 112 mEq/L   CO2 17 (*) 19 - 32 mEq/L   Glucose, Bld 90  70 - 99 mg/dL   BUN 5 (*) 6 - 23 mg/dL   Creatinine, Ser 0.86  0.50 - 1.35 mg/dL   Calcium 9.3  8.4 - 10.5 mg/dL   Total Protein 7.5  6.0 - 8.3 g/dL   Albumin 3.9  3.5 - 5.2 g/dL   AST 42 (*) 0 - 37 U/L   ALT 31  0 - 53 U/L   Alkaline Phosphatase 105  39 - 117 U/L   Total Bilirubin 0.3  0.3 - 1.2 mg/dL   GFR calc non Af Amer >90  >90 mL/min   GFR calc Af Amer >90  >90 mL/min   Comment: (NOTE)     The eGFR has been calculated using the CKD EPI equation.     This calculation has not been validated in all clinical situations.     eGFR's persistently <90 mL/min signify possible Chronic Kidney     Disease.   Anion gap 20 (*) 5 - 15  ETHANOL     Status: Abnormal   Collection Time  08/11/13  1:53 PM      Result Value Ref Range   Alcohol, Ethyl (B) 142 (*) 0 - 11 mg/dL   Comment:            LOWEST DETECTABLE LIMIT FOR     SERUM ALCOHOL IS 11 mg/dL     FOR MEDICAL PURPOSES ONLY  ACETAMINOPHEN LEVEL     Status: None   Collection Time    08/11/13  1:53 PM      Result Value Ref Range   Acetaminophen (Tylenol), Serum <15.0  10 - 30 ug/mL   Comment:            THERAPEUTIC CONCENTRATIONS  VARY     SIGNIFICANTLY. A RANGE OF 10-30     ug/mL MAY BE AN EFFECTIVE     CONCENTRATION FOR MANY PATIENTS.     HOWEVER, SOME ARE BEST TREATED     AT CONCENTRATIONS OUTSIDE THIS     RANGE.     ACETAMINOPHEN CONCENTRATIONS     >150 ug/mL AT 4 HOURS AFTER     INGESTION AND >50 ug/mL AT 12     HOURS AFTER INGESTION ARE     OFTEN ASSOCIATED WITH TOXIC     REACTIONS.  SALICYLATE LEVEL     Status: Abnormal   Collection Time    08/11/13  1:53 PM      Result Value Ref Range   Salicylate Lvl <3.3 (*) 2.8 - 20.0 mg/dL  URINE RAPID DRUG SCREEN (HOSP PERFORMED)     Status: None   Collection Time    08/11/13  4:22 PM      Result Value Ref Range   Opiates NONE DETECTED  NONE DETECTED   Cocaine NONE DETECTED  NONE DETECTED   Benzodiazepines NONE DETECTED  NONE DETECTED   Amphetamines NONE DETECTED  NONE DETECTED   Tetrahydrocannabinol NONE DETECTED  NONE DETECTED   Barbiturates NONE DETECTED  NONE DETECTED   Comment:            DRUG SCREEN FOR MEDICAL PURPOSES     ONLY.  IF CONFIRMATION IS NEEDED     FOR ANY PURPOSE, NOTIFY LAB     WITHIN 5 DAYS.                LOWEST DETECTABLE LIMITS     FOR URINE DRUG SCREEN     Drug Class       Cutoff (ng/mL)     Amphetamine      1000     Barbiturate      200     Benzodiazepine   612     Tricyclics       244     Opiates          300     Cocaine          300     THC              50  BASIC METABOLIC PANEL     Status: Abnormal   Collection Time    08/11/13  6:15 PM      Result Value Ref Range   Sodium 137  137 - 147 mEq/L   Potassium 4.2  3.7 - 5.3 mEq/L   Chloride 103  96 - 112 mEq/L   CO2 21  19 - 32 mEq/L   Glucose, Bld 85  70 - 99 mg/dL   BUN 7  6 - 23 mg/dL   Creatinine, Ser 1.17  0.50 -  1.35 mg/dL   Calcium 8.4  8.4 - 10.5 mg/dL   GFR calc non Af Amer 66 (*) >90 mL/min   GFR calc Af Amer 76 (*) >90 mL/min   Comment: (NOTE)     The eGFR has been calculated using the CKD EPI equation.     This calculation has not been validated in  all clinical situations.     eGFR's persistently <90 mL/min signify possible Chronic Kidney     Disease.   Anion gap 13  5 - 15   Psychological Evaluations:  Assessment:   DSM5: Schizophrenia Disorders:  NA Obsessive-Compulsive Disorders:  NA Trauma-Stressor Disorders:  NA Substance/Addictive Disorders:  Alcohol Related Disorder - Severe (303.90) Depressive Disorders:  Major Depressive Disorder - Moderate (296.22)  AXIS I:  Alcohol dependence, Hx. major depression AXIS II:  Deferred AXIS III:   Past Medical History  Diagnosis Date  . MVP (mitral valve prolapse)   . Anxiety   . Hypertension   . Hyperlipidemia   . Palpitations   . Bipolar disorder   . Depression   . Esophagitis   . Gastritis   . Insomnia   . Seizures   . Pneumonia 2005  . Arthritis   . Hypercholesteremia   . Coronary artery disease     a.  s/p PCI to RCA in 2007;  b.  Last LHC 04/2008:  LM with irregularities, LAD 50%, mid LAD 50%, proximal D1 80-90%, proximal OM1 30-40%, proximal OM2 50%, PLB branch 30-40%, site of prior angioplasty in the RCA widely patent, EF 65%=>Med Rx;  c. Nuclear study a 12/2011: EF 71% and normal perfusion.    Marland Kitchen History of echocardiogram     Echo 08/16/09: Mild LVH, EF 55-60%, no normal wall motion, grade 1 diastolic dysfunction   AXIS IV:  other psychosocial or environmental problems and Alcoholism, chronic AXIS V:  1-10 persistent dangerousness to self and others present  Treatment Plan/Recommendations: 1. Admit for crisis management and stabilization, estimated length of stay 3-5 days.  2. Medication management to reduce current symptoms to base line and improve the patient's overall level of functioning; continue current treatment plan already in progress.  3. Treat health problems as indicated.  4. Develop treatment plan to decrease risk of relapse upon discharge and the need for readmission.  5. Psycho-social education regarding relapse prevention and self care.  6. Health  care follow up as needed for medical problems.  7. Review, reconcile, and reinstate any pertinent home medications for other health issues where appropriate. 8. Call for consults with hospitalist for any additional specialty patient care services as needed.  Treatment Plan Summary: Daily contact with patient to assess and evaluate symptoms and progress in treatment Medication management  Current Medications:  Current Facility-Administered Medications  Medication Dose Route Frequency Provider Last Rate Last Dose  . acetaminophen (TYLENOL) tablet 650 mg  650 mg Oral Q6H PRN Laverle Hobby, PA-C      . alum & mag hydroxide-simeth (MAALOX/MYLANTA) 200-200-20 MG/5ML suspension 30 mL  30 mL Oral Q4H PRN Laverle Hobby, PA-C      . aspirin EC tablet 325 mg  325 mg Oral q morning - 10a Laverle Hobby, PA-C   325 mg at 08/12/13 0835  . atorvastatin (LIPITOR) tablet 40 mg  40 mg Oral Daily Laverle Hobby, PA-C   40 mg at 08/12/13 0836  . buPROPion (WELLBUTRIN XL) 24 hr tablet 300 mg  300 mg Oral Daily Laverle Hobby, PA-C  300 mg at 08/12/13 0836  . chlordiazePOXIDE (LIBRIUM) capsule 25 mg  25 mg Oral Q6H PRN Laverle Hobby, PA-C      . chlordiazePOXIDE (LIBRIUM) capsule 25 mg  25 mg Oral QID Laverle Hobby, PA-C   25 mg at 08/12/13 1257   Followed by  . [START ON 08/13/2013] chlordiazePOXIDE (LIBRIUM) capsule 25 mg  25 mg Oral TID Laverle Hobby, PA-C       Followed by  . [START ON 08/14/2013] chlordiazePOXIDE (LIBRIUM) capsule 25 mg  25 mg Oral BH-qamhs Spencer E Simon, PA-C       Followed by  . [START ON 08/15/2013] chlordiazePOXIDE (LIBRIUM) capsule 25 mg  25 mg Oral Daily Laverle Hobby, PA-C      . hydrOXYzine (ATARAX/VISTARIL) tablet 25 mg  25 mg Oral Q6H PRN Laverle Hobby, PA-C      . loperamide (IMODIUM) capsule 2-4 mg  2-4 mg Oral PRN Laverle Hobby, PA-C      . magnesium hydroxide (MILK OF MAGNESIA) suspension 30 mL  30 mL Oral Daily PRN Laverle Hobby, PA-C      . metoprolol  succinate (TOPROL-XL) 24 hr tablet 25 mg  25 mg Oral Daily Laverle Hobby, PA-C   25 mg at 08/12/13 0837  . multivitamin with minerals tablet 1 tablet  1 tablet Oral Daily Laverle Hobby, PA-C   1 tablet at 08/12/13 3779  . nitroGLYCERIN (NITROSTAT) SL tablet 0.4 mg  0.4 mg Sublingual Q5 min PRN Laverle Hobby, PA-C      . ondansetron (ZOFRAN-ODT) disintegrating tablet 4 mg  4 mg Oral Q6H PRN Laverle Hobby, PA-C      . QUEtiapine (SEROQUEL XR) 24 hr tablet 200 mg  200 mg Oral QHS Spencer E Simon, PA-C      . thiamine (B-1) injection 100 mg  100 mg Intramuscular Once Spencer E Simon, PA-C      . [START ON 08/13/2013] thiamine (VITAMIN B-1) tablet 100 mg  100 mg Oral Daily Laverle Hobby, PA-C        Observation Level/Precautions:  15 minute checks  Laboratory:  Per ED  Psychotherapy: Group sessions   Medications:  See medication lists  Consultations: As needed    Discharge Concerns:  Maintaining sobriety  Estimated LOS: 2-3 days  Other:     I certify that inpatient services furnished can reasonably be expected to improve the patient's condition.   Encarnacion Slates, PMHNP-BC 7/21/20151:37 PM  I personally assessed the patient, reviewed the physical exam and labs and formulated the treatment plan Geralyn Flash A. Sabra Heck, M.D.
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
## 2013-08-12 NOTE — Progress Notes (Signed)
Patient ID: Kandis BanDavid M Duplechain, male   DOB: 07/05/1950, 63 y.o.   MRN: [id number] PER STATE REGULATIONS 482.30  THIS CHART WAS REVIEWED FOR MEDICAL NECESSITY WITH RESPECT TO THE PATIENT'S ADMISSION/ DURATION OF STAY.  NEXT REVIEW DATE: 08/16/2013  Willa RoughJENNIFER JONES Zarahi Fuerst, RN, BSN CASE MANAGER

## 2013-08-12 NOTE — Progress Notes (Signed)
Patient ID: [name], male   DOB: 1950-05-12, 63 y.o.   MRN: [id number] He has been up for meals and medications. Slept most of day. Has been interacting with peers and staff when up.  Has not requested any prn medications and has been calm and cooperative.
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
## 2013-08-12 NOTE — Progress Notes (Signed)
Recreation Therapy Notes   Animal-Assisted Activity/Therapy (AAA/T) Program Checklist/Progress Notes Patient Eligibility Criteria Checklist & Daily Group note for Rec Tx Intervention  Date: 07.21.2015 Time: 2:45pm Location: [address] Dayroom    AAA/T Program Assumption of Risk Form signed by Patient/ or Parent Legal Guardian yes  Patient is free of allergies or sever asthma yes  Patient reports no fear of animals yes  Patient reports no history of cruelty to animals yes   Patient understands his/her participation is voluntary yes  Behavioral Response: Did not attend.    Marykay Lexenise L Lateisha Thurlow, LRT/CTRS  Jearl KlinefelterBlanchfield, Reganne Messerschmidt L 08/12/2013 4:39 PM

## 2013-08-13 MED ORDER — CLONIDINE HCL 0.1 MG PO TABS
0.1000 mg | ORAL_TABLET | Freq: Two times a day (BID) | ORAL | Status: DC
  Administered 2013-08-13 – 2013-08-15 (×4): 0.1 mg via ORAL
  Filled 2013-08-13 (×7): qty 1

## 2013-08-13 MED ORDER — QUETIAPINE FUMARATE ER 50 MG PO TB24
250.0000 mg | ORAL_TABLET | Freq: Every day | ORAL | Status: DC
  Administered 2013-08-13 – 2013-08-14 (×2): 250 mg via ORAL
  Filled 2013-08-13 (×6): qty 1

## 2013-08-13 MED ORDER — PANTOPRAZOLE SODIUM 40 MG PO TBEC
40.0000 mg | DELAYED_RELEASE_TABLET | Freq: Every day | ORAL | Status: DC
  Administered 2013-08-13 – 2013-08-15 (×3): 40 mg via ORAL
  Filled 2013-08-13 (×6): qty 1

## 2013-08-13 MED ORDER — MIRTAZAPINE 15 MG PO TABS
15.0000 mg | ORAL_TABLET | Freq: Every day | ORAL | Status: DC
  Administered 2013-08-13: 15 mg via ORAL
  Filled 2013-08-13 (×3): qty 1

## 2013-08-13 NOTE — Progress Notes (Signed)
Did nt attend

## 2013-08-13 NOTE — [facility] Group Notes (Signed)
[facility] Note   08/13/2013 10:06 AM  Participation Quality:  DID NOT ATTEND-pt in room resting.   Smart, American FinancialHeather LCSWA

## 2013-08-13 NOTE — Progress Notes (Signed)
[facility] MD Progress Note  08/13/2013 4:22 PM YURIY CUI  MRN:  [id number]  Subjective:  [name] says he is still going through hash withdrawal symptoms. He states that he did not sleep last night and that has affected his day. He complains of restlessness and stomach upset. However, he denies being depressed. His blood pressure is elevated.  Diagnosis:   DSM5: Schizophrenia Disorders:  NA Obsessive-Compulsive Disorders:  NA Trauma-Stressor Disorders:  NA Substance/Addictive Disorders:  Alcohol Related Disorder - Severe (303.90) Depressive Disorders:  NA  Axis I: Alcohol dependence Axis II: Deferred Axis III:  Past Medical History  Diagnosis Date  . MVP (mitral valve prolapse)   . Anxiety   . Hypertension   . Hyperlipidemia   . Palpitations   . Bipolar disorder   . Depression   . Esophagitis   . Gastritis   . Insomnia   . Seizures   . Pneumonia 2005  . Arthritis   . Hypercholesteremia   . Coronary artery disease     a.  s/p PCI to RCA in 2007;  b.  Last LHC 04/2008:  LM with irregularities, LAD 50%, mid LAD 50%, proximal D1 80-90%, proximal OM1 30-40%, proximal OM2 50%, PLB branch 30-40%, site of prior angioplasty in the RCA widely patent, EF 65%=>Med Rx;  c. Nuclear study a 12/2011: EF 71% and normal perfusion.    Marland Kitchen History of echocardiogram     Echo 08/16/09: Mild LVH, EF 55-60%, no normal wall motion, grade 1 diastolic dysfunction   Axis IV: other psychosocial or environmental problems and Alcoholism, chronic Axis V: 41-50 serious symptoms  ADL's:  Intact  Sleep: Poor  Appetite:  Fair  Suicidal Ideation:  Plan:  Denies Intent:  Denies Means:  denies Homicidal Ideation:  Plan:  Denies Intent:  Denies Means:  denies AEB (as evidenced by):  Psychiatric Specialty Exam: Physical Exam  Psychiatric: His speech is normal and behavior is normal. Judgment and thought content normal. His mood appears anxious. His affect is not angry. Cognition and memory are normal. He does  not exhibit a depressed mood.    Review of Systems  Constitutional: Positive for malaise/fatigue and diaphoresis.  Eyes: Negative.   Respiratory: Negative.   Cardiovascular: Negative.   Gastrointestinal: Positive for nausea and abdominal pain.  Genitourinary: Negative.   Musculoskeletal: Negative.   Skin: Negative.   Neurological: Positive for tremors and weakness.  Endo/Heme/Allergies: Negative.   Psychiatric/Behavioral: Positive for substance abuse. Negative for depression, suicidal ideas, hallucinations and memory loss. The patient is nervous/anxious and has insomnia.     Blood pressure 154/96, pulse 80, temperature 98.2 F (36.8 C), temperature source Oral, resp. rate 18, height _0  (1.753 m), weight 90.719 kg (200 lb), SpO2 98.00%.Body mass index is 29.52 kg/(m^2).  General Appearance: Casual and Fairly Groomed  Engineer, water::  Good  Speech:  Clear and Coherent  Volume:  Normal  Mood:  Anxious  Affect:  Congruent  Thought Process:  Coherent  Orientation:  Full (Time, Place, and Person)  Thought Content:  Rumination  Suicidal Thoughts:  No  Homicidal Thoughts:  No  Memory:  Immediate;   Good Recent;   Good Remote;   Good  Judgement:  Good  Insight:  Present  Psychomotor Activity:  Restlessness and Tremor  Concentration:  Fair  Recall:  Good  Fund of Knowledge:Fair  Language: Fair  Akathisia:  No  Handed:  Right  AIMS (if indicated):     Assets:  Communication Skills Desire for Improvement  Sleep:  Number of Hours: 5.75   Musculoskeletal: Strength & Muscle Tone: within normal limits Gait & Station: normal Patient leans: N/A  Current Medications: Current Facility-Administered Medications  Medication Dose Route Frequency Provider Last Rate Last Dose  . acetaminophen (TYLENOL) tablet 650 mg  650 mg Oral Q6H PRN Laverle Hobby, PA-C      . alum & mag hydroxide-simeth (MAALOX/MYLANTA) 200-200-20 MG/5ML suspension 30 mL  30 mL Oral Q4H PRN Laverle Hobby, PA-C    30 mL at 08/13/13 1138  . aspirin EC tablet 325 mg  325 mg Oral q morning - 10a Laverle Hobby, PA-C   325 mg at 08/13/13 0756  . atorvastatin (LIPITOR) tablet 40 mg  40 mg Oral Daily Laverle Hobby, PA-C   40 mg at 08/13/13 0756  . buPROPion (WELLBUTRIN XL) 24 hr tablet 300 mg  300 mg Oral Daily Laverle Hobby, PA-C   300 mg at 08/13/13 0756  . chlordiazePOXIDE (LIBRIUM) capsule 25 mg  25 mg Oral Q6H PRN Laverle Hobby, PA-C      . chlordiazePOXIDE (LIBRIUM) capsule 25 mg  25 mg Oral TID Laverle Hobby, PA-C   25 mg at 08/13/13 1137   Followed by  . [START ON 08/14/2013] chlordiazePOXIDE (LIBRIUM) capsule 25 mg  25 mg Oral BH-qamhs Spencer E Simon, PA-C       Followed by  . [START ON 08/15/2013] chlordiazePOXIDE (LIBRIUM) capsule 25 mg  25 mg Oral Daily Laverle Hobby, PA-C      . hydrOXYzine (ATARAX/VISTARIL) tablet 25 mg  25 mg Oral Q6H PRN Laverle Hobby, PA-C      . loperamide (IMODIUM) capsule 2-4 mg  2-4 mg Oral PRN Laverle Hobby, PA-C      . magnesium hydroxide (MILK OF MAGNESIA) suspension 30 mL  30 mL Oral Daily PRN Laverle Hobby, PA-C      . metoprolol succinate (TOPROL-XL) 24 hr tablet 25 mg  25 mg Oral Daily Laverle Hobby, PA-C   25 mg at 08/13/13 0756  . mirtazapine (REMERON) tablet 15 mg  15 mg Oral QHS Encarnacion Slates, NP      . multivitamin with minerals tablet 1 tablet  1 tablet Oral Daily Laverle Hobby, PA-C   1 tablet at 08/13/13 0756  . nitroGLYCERIN (NITROSTAT) SL tablet 0.4 mg  0.4 mg Sublingual Q5 min PRN Laverle Hobby, PA-C      . ondansetron (ZOFRAN-ODT) disintegrating tablet 4 mg  4 mg Oral Q6H PRN Laverle Hobby, PA-C      . pantoprazole (PROTONIX) EC tablet 40 mg  40 mg Oral Daily Encarnacion Slates, NP      . QUEtiapine (SEROQUEL XR) 24 hr tablet 250 mg  250 mg Oral QHS Encarnacion Slates, NP      . thiamine (VITAMIN B-1) tablet 100 mg  100 mg Oral Daily Laverle Hobby, PA-C   100 mg at 08/13/13 8563    Lab Results:  Results for orders placed during the  hospital encounter of 08/11/13 (from the past 48 hour(s))  BASIC METABOLIC PANEL     Status: Abnormal   Collection Time    08/11/13  6:15 PM      Result Value Ref Range   Sodium 137  137 - 147 mEq/L   Potassium 4.2  3.7 - 5.3 mEq/L   Chloride 103  96 - 112 mEq/L   CO2 21  19 - 32 mEq/L  Glucose, Bld 85  70 - 99 mg/dL   BUN 7  6 - 23 mg/dL   Creatinine, Ser 1.17  0.50 - 1.35 mg/dL   Calcium 8.4  8.4 - 10.5 mg/dL   GFR calc non Af Amer 66 (*) >90 mL/min   GFR calc Af Amer 76 (*) >90 mL/min   Comment: (NOTE)     The eGFR has been calculated using the CKD EPI equation.     This calculation has not been validated in all clinical situations.     eGFR's persistently <90 mL/min signify possible Chronic Kidney     Disease.   Anion gap 13  5 - 15    Physical Findings: AIMS: Facial and Oral Movements Muscles of Facial Expression: None, normal Lips and Perioral Area: None, normal Jaw: None, normal Tongue: None, normal,Extremity Movements Upper (arms, wrists, hands, fingers): None, normal Lower (legs, knees, ankles, toes): None, normal, Trunk Movements Neck, shoulders, hips: None, normal, Overall Severity Severity of abnormal movements (highest score from questions above): None, normal Incapacitation due to abnormal movements: None, normal Patient's awareness of abnormal movements (rate only patient's report): No Awareness, Dental Status Current problems with teeth and/or dentures?: No Does patient usually wear dentures?: No  CIWA:  CIWA-Ar Total: 2 COWS:     Treatment Plan Summary: Daily contact with patient to assess and evaluate symptoms and progress in treatment Medication management  Plan: Continue crisis management/mood stabilization. Supportive approach/coping skills/relapse prevention  Continue detox; increase Seroquel to 250 mg, add Mirtazapine 15 mg Q bedtime for sleep. Re-assess and treat co-morbidity;, initiate clonidine 0.1 mg bid x 3 days for increased HTN, add  Protonix 40 mg for acid reflux.  Medical Decision Making Problem Points:  Established problem, stable/improving (1), Review of last therapy session (1) and Review of psycho-social stressors (1) Data Points:  Review of medication regiment & side effects (2) Review of new medications or change in dosage (2)  I certify that inpatient services furnished can reasonably be expected to improve the patient's condition.   Lindell Spar I, PMHNP-BC 08/13/2013, 4:22 PM  I have examined the patient and agreed with the findings of H&P and treatment Plan.  Berniece Andreas, MD

## 2013-08-13 NOTE — Progress Notes (Signed)
Patient ID: [name], male   DOB: 1950-10-08, 63 y.o.   MRN: [id number] He has been up and to meals , to get medication and groups in bed remainder of time. Has little interaction with peers. Continues to have tremors. Has not requested any prn medication.

## 2013-08-13 NOTE — Progress Notes (Signed)
Attended Group

## 2013-08-14 MED ORDER — MIRTAZAPINE 30 MG PO TABS
30.0000 mg | ORAL_TABLET | Freq: Every day | ORAL | Status: DC
  Administered 2013-08-14: 30 mg via ORAL
  Filled 2013-08-14 (×3): qty 1

## 2013-08-14 NOTE — Progress Notes (Signed)
D    Pt is pleasant on approach and is cooperative and compliant with his treatment    He complained of tremors and does have moderate tremors with arms extended   He attended karaoke group and interacts appropriately with others A   Verbal support given   Medications administered and effectiveness monitored   Q 15 min checks R    Pt safe at present

## 2013-08-14 NOTE — Progress Notes (Signed)
Patient ID: [name] BanDavid M [name], male   DOB: [birth year], 63 y.o.   MRN: [id number] He was in bed this AM and has been up and to lunch and to a group. He requested and received prn for anxiety that has been helpful. Stated he wanted to leave.  Tomorrow. Self inventory done 13:30. Depression 2, hopelessness 0, anxiety 5. Withdrawal of diarrhea and headache , has not requested prn, He denies Si thought.  Goal : mild nervousness . Will meet goal by telling nurse.

## 2013-08-14 NOTE — [facility] Group Notes (Signed)
0900 nursing orientation group  The focus of this group is to educate the patient on the purpose and policies of crisis stabilization and provide a format to answer questions about their admission.  The group details unit policies and expectations of patients while admitted.   Pt did not attend he was in his bed and refused to get up for group.  

## 2013-08-14 NOTE — Progress Notes (Signed)
Patient ID: [name], male   DOB: [birth year], 63 y.o.   MRN: [id number]   D: Pt informed the writer that he attended 2 groups today. Informed the writer that he spoke with his a doctor today, but didn't go into any detail about the meeting. Pt asked the writer if there was a new order for sleep meds, and was informed there was. Pt voiced not other questions or concerns.  A:  Support and encouragement was offered. 15 min checks continued for safety.  R: Pt remains safe.

## 2013-08-14 NOTE — [facility] Group Notes (Signed)
[facility] LCSW Group Therapy  08/14/2013 3:03 PM  Type of Therapy:  Group Therapy  Participation Level:  Did Not Attend-pt stayed in group for five minutes, left, and did not return. He told CSW that he is supposed to dc/ on Friday and plans [facility] return [facility]. Pt did not participate in group discussion and left shortly after group began.   Smart, Cataleia Gade LCSWA  08/14/2013, 3:03 PM

## 2013-08-14 NOTE — Progress Notes (Signed)
Patient ID: [name], male   DOB: 14-Jul-1950, 63 y.o.   MRN: [id number] [facility] MD Progress Note  08/14/2013 11:06 AM HALLEY KINCER  MRN:  [id number]  Subjective:  [name] says he did not sleep well last night even with new order on Remeron (Mirtazapine) and increase in Seroquel. Hopes that his sleep will improve while in the hospital. He did endorse being in good mood. Withdrawal symptoms are improving. See new changes made on Remeron dose.  Diagnosis:   DSM5: Schizophrenia Disorders:  NA Obsessive-Compulsive Disorders:  NA Trauma-Stressor Disorders:  NA Substance/Addictive Disorders:  Alcohol Related Disorder - Severe (303.90) Depressive Disorders:  NA  Axis I: Alcohol dependence Axis II: Deferred Axis III:  Past Medical History  Diagnosis Date  . MVP (mitral valve prolapse)   . Anxiety   . Hypertension   . Hyperlipidemia   . Palpitations   . Bipolar disorder   . Depression   . Esophagitis   . Gastritis   . Insomnia   . Seizures   . Pneumonia 2005  . Arthritis   . Hypercholesteremia   . Coronary artery disease     a.  s/p PCI to RCA in 2007;  b.  Last LHC 04/2008:  LM with irregularities, LAD 50%, mid LAD 50%, proximal D1 80-90%, proximal OM1 30-40%, proximal OM2 50%, PLB branch 30-40%, site of prior angioplasty in the RCA widely patent, EF 65%=>Med Rx;  c. Nuclear study a 12/2011: EF 71% and normal perfusion.    Marland Kitchen History of echocardiogram     Echo 08/16/09: Mild LVH, EF 55-60%, no normal wall motion, grade 1 diastolic dysfunction   Axis IV: other psychosocial or environmental problems and Alcoholism, chronic Axis V: 41-50 serious symptoms  ADL's:  Intact  Sleep: Poor  Appetite:  Fair  Suicidal Ideation:  Plan:  Denies Intent:  Denies Means:  denies Homicidal Ideation:  Plan:  Denies Intent:  Denies Means:  denies AEB (as evidenced by):  Psychiatric Specialty Exam: Physical Exam  Psychiatric: His speech is normal and behavior is normal. Judgment and thought  content normal. His mood appears anxious. His affect is not angry. Cognition and memory are normal. He does not exhibit a depressed mood.    Review of Systems  Constitutional: Positive for malaise/fatigue and diaphoresis.  Eyes: Negative.   Respiratory: Negative.   Cardiovascular: Negative.   Gastrointestinal: Positive for nausea and abdominal pain.  Genitourinary: Negative.   Musculoskeletal: Negative.   Skin: Negative.   Neurological: Positive for tremors and weakness.  Endo/Heme/Allergies: Negative.   Psychiatric/Behavioral: Positive for substance abuse. Negative for depression, suicidal ideas, hallucinations and memory loss. The patient is nervous/anxious and has insomnia.     Blood pressure 130/91, pulse 102, temperature 98.2 F (36.8 C), temperature source Oral, resp. rate 20, height 5\' 9"  (1.753 m), weight 90.719 kg (200 lb), SpO2 98.00%.Body mass index is 29.52 kg/(m^2).  General Appearance: Casual and Fairly Groomed  Patent attorney::  Good  Speech:  Clear and Coherent  Volume:  Normal  Mood:  Anxious  Affect:  Congruent  Thought Process:  Coherent  Orientation:  Full (Time, Place, and Person)  Thought Content:  Rumination  Suicidal Thoughts:  No  Homicidal Thoughts:  No  Memory:  Immediate;   Good Recent;   Good Remote;   Good  Judgement:  Good  Insight:  Present  Psychomotor Activity:  Restlessness and Tremor  Concentration:  Fair  Recall:  Good  Fund of Knowledge:Fair  Language: Fair  Akathisia:  No  Handed:  Right  AIMS (if indicated):     Assets:  Communication Skills Desire for Improvement  Sleep:  Number of Hours: 2.5   Musculoskeletal: Strength & Muscle Tone: within normal limits Gait & Station: normal Patient leans: N/A  Current Medications: Current Facility-Administered Medications  Medication Dose Route Frequency Provider Last Rate Last Dose  . acetaminophen (TYLENOL) tablet 650 mg  650 mg Oral Q6H PRN Kerry HoughSpencer E Simon, PA-C      . alum & mag  hydroxide-simeth (MAALOX/MYLANTA) 200-200-20 MG/5ML suspension 30 mL  30 mL Oral Q4H PRN Kerry HoughSpencer E Simon, PA-C   30 mL at 08/13/13 1138  . aspirin EC tablet 325 mg  325 mg Oral q morning - 10a Kerry HoughSpencer E Simon, PA-C   325 mg at 08/14/13 [id number]  . atorvastatin (LIPITOR) tablet 40 mg  40 mg Oral Daily Kerry HoughSpencer E Simon, PA-C   40 mg at 08/14/13 [id number]  . buPROPion (WELLBUTRIN XL) 24 hr tablet 300 mg  300 mg Oral Daily Kerry HoughSpencer E Simon, PA-C   300 mg at 08/14/13 [id number]  . chlordiazePOXIDE (LIBRIUM) capsule 25 mg  25 mg Oral Q6H PRN Kerry HoughSpencer E Simon, PA-C   25 mg at 08/14/13 0202  . chlordiazePOXIDE (LIBRIUM) capsule 25 mg  25 mg Oral BH-qamhs Spencer Nancy Fetter Simon, PA-C   25 mg at 08/14/13 [id number]   Followed by  . [START ON 08/15/2013] chlordiazePOXIDE (LIBRIUM) capsule 25 mg  25 mg Oral Daily Kerry HoughSpencer E Simon, PA-C      . cloNIDine (CATAPRES) tablet 0.1 mg  0.1 mg Oral BID Sanjuana KavaAgnes I Nwoko, NP   0.1 mg at 08/14/13 0814  . hydrOXYzine (ATARAX/VISTARIL) tablet 25 mg  25 mg Oral Q6H PRN Kerry HoughSpencer E Simon, PA-C   25 mg at 08/14/13 0006  . loperamide (IMODIUM) capsule 2-4 mg  2-4 mg Oral PRN Kerry HoughSpencer E Simon, PA-C      . magnesium hydroxide (MILK OF MAGNESIA) suspension 30 mL  30 mL Oral Daily PRN Kerry HoughSpencer E Simon, PA-C      . metoprolol succinate (TOPROL-XL) 24 hr tablet 25 mg  25 mg Oral Daily Kerry HoughSpencer E Simon, PA-C   25 mg at 08/14/13 [id number]  . mirtazapine (REMERON) tablet 15 mg  15 mg Oral QHS Sanjuana KavaAgnes I Nwoko, NP   15 mg at 08/13/13 2145  . multivitamin with minerals tablet 1 tablet  1 tablet Oral Daily Kerry HoughSpencer E Simon, PA-C   1 tablet at 08/14/13 [id number]  . nitroGLYCERIN (NITROSTAT) SL tablet 0.4 mg  0.4 mg Sublingual Q5 min PRN Kerry HoughSpencer E Simon, PA-C      . ondansetron (ZOFRAN-ODT) disintegrating tablet 4 mg  4 mg Oral Q6H PRN Kerry HoughSpencer E Simon, PA-C      . pantoprazole (PROTONIX) EC tablet 40 mg  40 mg Oral Daily Sanjuana KavaAgnes I Nwoko, NP   40 mg at 08/14/13 0813  . QUEtiapine (SEROQUEL XR) 24 hr tablet 250 mg  250 mg Oral QHS Sanjuana KavaAgnes I Nwoko, NP    250 mg at 08/13/13 2145  . thiamine (VITAMIN B-1) tablet 100 mg  100 mg Oral Daily Kerry HoughSpencer E Simon, PA-C   100 mg at 08/13/13 [id number]    Lab Results:  No results found for this or any previous visit (from the past 48 hour(s)).  Physical Findings: AIMS: Facial and Oral Movements Muscles of Facial Expression: None, normal Lips and Perioral Area: None, normal Jaw: None, normal Tongue: None, normal,Extremity Movements Upper (arms, wrists, hands, fingers): None, normal  Lower (legs, knees, ankles, toes): None, normal, Trunk Movements Neck, shoulders, hips: None, normal, Overall Severity Severity of abnormal movements (highest score from questions above): None, normal Incapacitation due to abnormal movements: None, normal Patient's awareness of abnormal movements (rate only patient's report): No Awareness, Dental Status Current problems with teeth and/or dentures?: No Does patient usually wear dentures?: No  CIWA:  CIWA-Ar Total: 2 COWS:     Treatment Plan Summary: Daily contact with patient to assess and evaluate symptoms and progress in treatment Medication management  Plan: Continue crisis management/mood stabilization. Supportive approach/coping skills/relapse prevention  Continue detox; continue Seroquel 250 mg, increase Mirtazapine to 30 mg Q bedtime for sleep. Re-assess and treat co-morbidity; continue clonidine 0.1 mg bid x 3 days for increased HTN, continue Protonix 40 mg for acid reflux.  Medical Decision Making Problem Points:  Established problem, stable/improving (1), Review of last therapy session (1) and Review of psycho-social stressors (1) Data Points:  Review of medication regiment & side effects (2) Review of new medications or change in dosage (2)  I certify that inpatient services furnished can reasonably be expected to improve the patient's condition.   Armandina Stammer I, PMHNP-BC 08/14/2013, 11:06 AM  I agreed with the findings, treatment and disposition plan of  this patient. Kathryne Sharper, MD

## 2013-08-15 DIAGNOSIS — F1010 Alcohol abuse, uncomplicated: Secondary | ICD-10-CM

## 2013-08-15 DIAGNOSIS — F332 Major depressive disorder, recurrent severe without psychotic features: Secondary | ICD-10-CM

## 2013-08-15 MED ORDER — PANTOPRAZOLE SODIUM 40 MG PO TBEC: 40.0000 mg | DELAYED_RELEASE_TABLET | Freq: Every day | ORAL | Status: DC

## 2013-08-15 MED ORDER — ASPIRIN EC 325 MG PO TBEC: 325.0000 mg | DELAYED_RELEASE_TABLET | Freq: Every morning | ORAL | Status: DC

## 2013-08-15 MED ORDER — QUETIAPINE FUMARATE ER 50 MG PO TB24: 250.0000 mg | ORAL_TABLET | Freq: Every day | ORAL | Status: DC

## 2013-08-15 MED ORDER — METOPROLOL SUCCINATE ER 25 MG PO TB24: 25.0000 mg | ORAL_TABLET | Freq: Every day | ORAL | Status: DC

## 2013-08-15 MED ORDER — MIRTAZAPINE 30 MG PO TABS: 30.0000 mg | ORAL_TABLET | Freq: Every day | ORAL | Status: DC

## 2013-08-15 MED ORDER — CLONIDINE HCL 0.1 MG PO TABS: 0.1000 mg | ORAL_TABLET | Freq: Two times a day (BID) | ORAL | Status: DC

## 2013-08-15 NOTE — [facility] Suicide Risk Assessment (Signed)
   Demographic Factors:  Male and Living alone  Total Time spent with patient: 30 minutes  Psychiatric Specialty Exam: Physical Exam  ROS  Blood pressure 144/91, pulse 94, temperature 97.7 F (36.5 C), temperature source Oral, resp. rate 20, height 5\' 9"  (1.753 m), weight 200 lb (90.719 kg), SpO2 98.00%.Body mass index is 29.52 kg/(m^2).  General Appearance: Casual  Eye Contact::  Good  Speech:  Normal Rate  Volume:  Normal  Mood:  Anxious  Affect:  Appropriate  Thought Process:  Coherent  Orientation:  Full (Time, Place, and Person)  Thought Content:  Rumination  Suicidal Thoughts:  No  Homicidal Thoughts:  No  Memory:  Immediate;   Fair Recent;   Fair Remote;   Fair  Judgement:  Intact  Insight:  Fair  Psychomotor Activity:  Decreased  Concentration:  Good  Recall:  Good  Fund of Knowledge:Good  Language: Good  Akathisia:  No  Handed:  Right  AIMS (if indicated):     Assets:  Communication Skills Desire for Improvement Housing Social Support  Sleep:  Number of Hours: 5    Musculoskeletal: Strength & Muscle Tone: within normal limits Gait & Station: normal Patient leans: N/A   Mental Status Per Nursing Assessment::   On Admission:     Current Mental Status by Physician: NA  Loss Factors: Financial problems/change in socioeconomic status  Historical Factors: Impulsivity  Risk Reduction Factors:   Sense of responsibility to family, Religious beliefs about death, Positive social support, Positive therapeutic relationship and Positive coping skills or problem solving skills  Continued Clinical Symptoms:  Depression:   Comorbid alcohol abuse/dependence Hopelessness  Cognitive Features That Contribute To Risk:  Polarized thinking    Suicide Risk:  Minimal: No identifiable suicidal ideation.  Patients presenting with no risk factors but with morbid ruminations; may be classified as minimal risk based on the severity of the depressive  symptoms  Discharge Diagnoses:   AXIS I:  Alcohol Abuse and Major Depression, Recurrent severe AXIS II:  Deferred AXIS III:   Past Medical History  Diagnosis Date  . MVP (mitral valve prolapse)   . Anxiety   . Hypertension   . Hyperlipidemia   . Palpitations   . Bipolar disorder   . Depression   . Esophagitis   . Gastritis   . Insomnia   . Seizures   . Pneumonia 2005  . Arthritis   . Hypercholesteremia   . Coronary artery disease     a.  s/p PCI to RCA in 2007;  b.  Last LHC 04/2008:  LM with irregularities, LAD 50%, mid LAD 50%, proximal D1 80-90%, proximal OM1 30-40%, proximal OM2 50%, PLB branch 30-40%, site of prior angioplasty in the RCA widely patent, EF 65%=>Med Rx;  c. Nuclear study a 12/2011: EF 71% and normal perfusion.    Marland Kitchen. History of echocardiogram     Echo 08/16/09: Mild LVH, EF 55-60%, no normal wall motion, grade 1 diastolic dysfunction   AXIS IV:  housing problems and problems related to social environment AXIS V:  61-70 mild symptoms  Plan Of Care/Follow-up recommendations:  Activity:  as tolerated Diet:  un changed Other:  Continue AA and [facility] for medication management  Is patient on multiple antipsychotic therapies at discharge:  No   Has Patient had three or more failed trials of antipsychotic monotherapy by history:  No  Recommended Plan for Multiple Antipsychotic Therapies: NA    Lasasha Brophy T. 08/15/2013, 10:58 AM

## 2013-08-15 NOTE — Discharge Instructions (Signed)
Finding Treatment for Alcohol and Drug Addiction It can be hard to find the right place to get professional treatment. Here are some important things to consider:  There are different types of treatment to choose from.  Some programs are live-in (residential) while others are not (outpatient). Sometimes a combination is offered.  No single type of program is right for everyone.  Most treatment programs involve a combination of education, counseling, and a 12-step, spiritually-based approach.  There are non-spiritually based programs (not 12-step).  Some treatment programs are government sponsored. They are geared for patients without private insurance.  Treatment programs can vary in many respects such as:  Cost and types of insurance accepted.  Types of on-site medical services offered.  Length of stay, setting, and size.  Overall philosophy of treatment. A person may need specialized treatment or have needs not addressed by all programs. For example, adolescents need treatment appropriate for their age. Other people have secondary disorders that must be managed as well. Secondary conditions can include mental illness, such as depression or diabetes. Often, a period of detoxification from alcohol or drugs is needed. This requires medical supervision and not all programs offer this. THINGS TO CONSIDER WHEN SELECTING A TREATMENT PROGRAM   Is the program certified by the appropriate government agency? Even private programs must be certified and employ certified professionals.  Does the program accept your insurance? If not, can a payment plan be set up?  Is the facility clean, organized, and well run? Do they allow you to speak with graduates who can share their treatment experience with you? Can you tour the facility? Can you meet with staff?  Does the program meet the full range of individual needs?  Does the treatment program address sexual orientation and physical disabilities?  Do they provide age, gender, and culturally appropriate treatment services?  Is treatment available in languages other than English?  Is long-term aftercare support or guidance encouraged and provided?  Is assessment of an individual's treatment plan ongoing to ensure it meets changing needs?  Does the program use strategies to encourage reluctant patients to remain in treatment long enough to increase the likelihood of success?  Does the program offer counseling (individual or group) and other behavioral therapies?  Does the program offer medicine as part of the treatment regimen, if needed?  Is there ongoing monitoring of possible relapse? Is there a defined relapse prevention program? Are services or referrals offered to family members to ensure they understand addiction and the recovery process? This would help them support the recovering individual.  Are 12-step meetings held at the center or is transport available for patients to attend outside meetings? In countries outside of the [place], see local directories for contact information for services in your area. Document Released: 12/08/2004 Document Revised: 04/03/2011 Document Reviewed: 06/20/2007 ExitCare Patient Information 2015 [employer]. This information is not intended to replace advice given to you by your health care provider. Make sure you discuss any questions you have with your health care provider.  

## 2013-08-15 NOTE — Progress Notes (Signed)
Adult Psychoeducational Group Note  Date:  08/15/2013 Time:  11:12 AM  Group Topic/Focus:  Relapse Prevention Planning:   The focus of this group is to define relapse and discuss the need for planning to combat relapse.  Participation Level:  Active  Participation Quality:  Appropriate, Attentive, Sharing and Supportive  Affect:  Appropriate and Excited  Cognitive:  Alert and Appropriate  Insight: Appropriate and Good  Engagement in Group:  Engaged and Supportive  Modes of Intervention:  Discussion and Education  Additional Comments:  Pt was supportive during group and appropriately shared his thoughts with group. Pt appeared positive and energetic throughout discussion.   Dalia HeadingSeeley, Deandrae Wajda Aileen 08/15/2013, 11:12 AM

## 2013-08-15 NOTE — [facility] Group Notes (Signed)
[facility]   08/15/2013 9:18 AM  Participation Quality:  Appropriate   Mood/Affect:  Appropriate  Depression Rating:  0  Anxiety Rating:  1-2  Thoughts of Suicide:  No Will you contract for safety?   N/A  Current AVH:  No  Plan for Discharge/Comments:  Pt plans to return home and [facility]. Pt not interested in any other referrals at this time.   Transportation Means: brother   Supports: brother/some family supports identified.   Smart, American FinancialHeather LCSWA

## 2013-08-15 NOTE — Progress Notes (Signed)
Patient ID: [name], male   DOB: 04/27/1950, 63 y.o.   MRN: [id number]  Pt was discharged home with prescriptions, pt reported that he was okay and he felt that he needed to move on. Pt reported on his self inventory sheet that he was negative SI/HI, no AH/VH noted. Pt reported that his depression was a 0, and his helplessness was a 0. No issues or concerns noted at time of discharge.

## 2013-08-15 NOTE — Discharge Summary (Signed)
Physician Discharge Summary Note  Patient:  [name] is an 63 y.o., male MRN:  [id number] DOB:  01/16/1951 Patient phone:  [phone] (home)  Patient address:   [address] [place] [address],  Total Time spent with patient: 20 minutes  Date of Admission:  08/12/2013 Date of Discharge: 08/15/2013  Reason for Admission:  Alcohol detox/dependence  Discharge Diagnoses: Principal Problem:   Alcohol dependence Active Problems:   Anxiety   Depression   Psychiatric Specialty Exam: Physical Exam  Constitutional: He is oriented to person, place, and time. He appears well-developed and well-nourished.  HENT:  Head: Normocephalic.  Neck: Normal range of motion.  Respiratory: Effort normal.  GI: Soft.  Musculoskeletal: Normal range of motion.  Neurological: He is alert and oriented to person, place, and time.    Review of Systems  Constitutional: Negative.   HENT: Negative.   Eyes: Negative.   Respiratory: Negative.   Cardiovascular: Negative.   Gastrointestinal: Negative.   Genitourinary: Negative.   Musculoskeletal: Negative.   Skin: Negative.   Neurological: Negative.   Endo/Heme/Allergies: Negative.   Psychiatric/Behavioral: Positive for substance abuse.    Blood pressure 144/91, pulse 94, temperature 97.7 F (36.5 C), temperature source Oral, resp. rate 20, height 5\' 9"  (1.753 m), weight 200 lb (90.719 kg), SpO2 98.00%.Body mass index is 29.52 kg/(m^2).  General Appearance: Casual  Eye Contact::  Good  Speech:  Normal Rate  Volume:  Normal  Mood:  Euthymic  Affect:  Congruent  Thought Process:  Coherent  Orientation:  Full (Time, Place, and Person)  Thought Content:  WDL  Suicidal Thoughts:  No  Homicidal Thoughts:  No  Memory:  Immediate;   Good Recent;   Good Remote;   Good  Judgement:  Fair  Insight:  Fair  Psychomotor Activity:  Normal  Concentration:  Good  Recall:  Good  Fund of Knowledge:Good  Language: Good  Akathisia:  No   Handed:  Right  AIMS (if indicated):     Assets:  Financial Resources/Insurance Leisure Time Physical Health Resilience  Sleep:  Number of Hours: 5    Past Psychiatric History:  Diagnosis: Alcohol dependence   Hospitalizations: [facility] adult unit   Outpatient Care: [facility]   Substance Abuse Care: None reported   Self-Mutilation: Denies   Suicidal Attempts: Denies   Violent Behaviors: NA     Musculoskeletal: Strength & Muscle Tone: within normal limits Gait & Station: normal Patient leans: N/A  DSM5:  Substance/Addictive Disorders:  Alcohol Related Disorder - Severe (303.90) Depressive Disorders:  Major Depressive Disorder (296.99)  Axis Diagnosis:   AXIS I:  Alcohol Abuse and Major Depression, Recurrent severe AXIS II:  Deferred AXIS III:   Past Medical History  Diagnosis Date  . MVP (mitral valve prolapse)   . Anxiety   . Hypertension   . Hyperlipidemia   . Palpitations   . Bipolar disorder   . Depression   . Esophagitis   . Gastritis   . Insomnia   . Seizures   . Pneumonia 2005  . Arthritis   . Hypercholesteremia   . Coronary artery disease     a.  s/p PCI to RCA in 2007;  b.  Last LHC 04/2008:  LM with irregularities, LAD 50%, mid LAD 50%, proximal D1 80-90%, proximal OM1 30-40%, proximal OM2 50%, PLB branch 30-40%, site of prior angioplasty in the RCA widely patent, EF 65%=>Med Rx;  c. Nuclear study a 12/2011: EF 71% and normal perfusion.    Marland Kitchen  History of echocardiogram     Echo 08/16/09: Mild LVH, EF 55-60%, no normal wall motion, grade 1 diastolic dysfunction   AXIS IV:  other psychosocial or environmental problems, problems related to social environment and problems with primary support group AXIS V:  61-70 mild symptoms  Level of Care:  OP  Hospital Course:  On admission:  63 year old Caucasian male. [facility]. He reports, "A friend took me to the hospital yesterday. I have been drinking excessively x 3 weeks. My drinking is  progressively worsening. I drink a case of beer daily. Alcohol relaxes me so I don't stay anxious. I had 8 months sobriety until I had a relationship break up about 3 weeks ago. The bad feeling from the break-up led me to relapse. Anther reason that I stay anxious is because I'm going to be getting a settlement in August, about $250, 000 from a medical malpractice law suit. I want to make sure to spend this money wisely and properly. I feel tremulous, sweaty, shaky. But, I'm not really depressed. I would like to remain on my current medication regimen".  During hospitalization:  Ativan alcohol detox utilized successfully.  Medical medications continued along with his Wellbutrin 300 mg daily for depression and Vistaril 25 mg every 6 hours PRN anxiety.  His Seroquel was increased from 200 mg to 250 mg for mood stability.  Remeron 30 mg for sleep, Clonidine 0.1 mg BID, and Protonix 40 mg daily GERD started.   Patient attended individual and group therapy while inpatient along with attending AA groups, one-one time with MD daily, medications for detox managed during inpatient, follow-up appointments made prior to discharge, Rx given.  Onalee HuaDavid is mentally and physically stable for discharge.  He plans to go to AA 3 x a week with his friend who has "cleaned out my house of all the alcohol" and will stay with him after discharge for the day.  Consults:  None  Significant Diagnostic Studies:  labs: completed, reviewed, stable  Discharge Vitals:   Blood pressure 144/91, pulse 94, temperature 97.7 F (36.5 C), temperature source Oral, resp. rate 20, height 5\' 9"  (1.753 m), weight 200 lb (90.719 kg), SpO2 98.00%. Body mass index is 29.52 kg/(m^2). Lab Results:   No results found for this or any previous visit (from the past 72 hour(s)).  Physical Findings: AIMS: Facial and Oral Movements Muscles of Facial Expression: None, normal Lips and Perioral Area: None, normal Jaw: None, normal Tongue: None,  normal,Extremity Movements Upper (arms, wrists, hands, fingers): None, normal Lower (legs, knees, ankles, toes): None, normal, Trunk Movements Neck, shoulders, hips: None, normal, Overall Severity Severity of abnormal movements (highest score from questions above): None, normal Incapacitation due to abnormal movements: None, normal Patient's awareness of abnormal movements (rate only patient's report): No Awareness, Dental Status Current problems with teeth and/or dentures?: No Does patient usually wear dentures?: No  CIWA:  CIWA-Ar Total: 8 COWS:     Psychiatric Specialty Exam: See Psychiatric Specialty Exam and Suicide Risk Assessment completed by Attending Physician prior to discharge.  Discharge destination:  Home  Is patient on multiple antipsychotic therapies at discharge:  No   Has Patient had three or more failed trials of antipsychotic monotherapy by history:  No  Recommended Plan for Multiple Antipsychotic Therapies: NA  Discharge Instructions   Diet - low sodium heart healthy    Complete by:  As directed      Increase activity slowly    Complete  by:  As directed             Medication List    STOP taking these medications       LORazepam 1 MG tablet  Commonly known as:  ATIVAN      TAKE these medications     Indication   aspirin EC 325 MG tablet  Take 1 tablet (325 mg total) by mouth every morning. For blood thinner for heart health   Indication:  Blood thinner for heart heart     atorvastatin 40 MG tablet  Commonly known as:  LIPITOR  Take 1 tablet (40 mg total) by mouth daily. For cholesterol   Indication:  Inherited Heterozygous Hypercholesterolemia, Inherited Homozygous Hypercholesterolemia     buPROPion 300 MG 24 hr tablet  Commonly known as:  WELLBUTRIN XL  Take 1 tablet (300 mg total) by mouth daily. For depression   Indication:  Major Depressive Disorder     cloNIDine 0.1 MG tablet  Commonly known as:  CATAPRES  Take 1 tablet (0.1 mg total)  by mouth 2 (two) times daily.   Indication:  High Blood Pressure     hydrOXYzine 25 MG tablet  Commonly known as:  ATARAX/VISTARIL  Take 1 tablet (25 mg total) by mouth every 6 (six) hours as needed for anxiety.   Indication:  Anxiety associated with Organic Disease     metoprolol succinate 25 MG 24 hr tablet  Commonly known as:  TOPROL-XL  Take 1 tablet (25 mg total) by mouth daily.   Indication:  High Blood Pressure     mirtazapine 30 MG tablet  Commonly known as:  REMERON  Take 1 tablet (30 mg total) by mouth at bedtime.   Indication:  Trouble Sleeping, Major Depressive Disorder     nitroGLYCERIN 0.4 MG SL tablet  Commonly known as:  NITROSTAT  Place 1 tablet (0.4 mg total) under the tongue every 5 (five) minutes as needed for chest pain (CP or SOB).   Indication:  Acute Angina Pectoris     pantoprazole 40 MG tablet  Commonly known as:  PROTONIX  Take 1 tablet (40 mg total) by mouth daily.   Indication:  Gastroesophageal Reflux Disease     QUEtiapine 50 MG Tb24 24 hr tablet  Commonly known as:  SEROQUEL XR  Take 5 tablets (250 mg total) by mouth at bedtime.   Indication:  Mood control           Follow-up Information   Follow up with [facility]. (Walk in between 8am-9am Monday through Friday for hospital follow-up/medication management/assessment for therapy services. )    Contact information:   [address] [place] [address] Phone: [phone] Fax: [phone]      Follow-up recommendations:  Activity:  as tolerated Diet:  low-sodium heart healthy diet  Comments:  Patient will follow-up with [facility] for his care after discharge.  Total Discharge Time:  Greater than 30 minutes.  SignedNanine Means, PMH-NP 08/15/2013, 10:43 AM  I have personally seen the patient and agreed with the findings and involved in the treatment plan. Kathryne Sharper, MD

## 2013-08-15 NOTE — Progress Notes (Signed)
[facility] :  Will you be returning to the same living situation after discharge: Yes,  home At discharge, do you have transportation home?:Yes,  brother Do you have the ability to pay for your medications:Yes,  Medicare  Release of information consent forms completed and submitted to medical records by CSW. Patient to Follow up at: Follow-up Information   Follow up with [facility]. (Walk in between 8am-9am Monday through Friday for hospital follow-up/medication management/assessment for therapy services. )    Contact information:   [address] [place], [place] [address] Phone: [phone] Fax: [phone]      Patient denies SI/HI:   Yes,  during admission, group, self report    Safety Planning and Suicide Prevention discussed:  Yes,  SPE not required for this pt. SPI pamphlet provided to pt and he was encouraged to share information with support network, ask questions, and talk about any concerns relating to SPE.  Smart, [name] LCSWA  08/15/2013, 10:52 AM

## 2013-08-20 NOTE — Progress Notes (Signed)
Patient Discharge Instructions:  After Visit Summary (AVS):   Faxed to:  08/20/13 Discharge Summary Note:   Faxed to:  08/20/13 Psychiatric Admission Assessment Note:   Faxed to:  08/20/13 Suicide Risk Assessment - Discharge Assessment:   Faxed to:  08/20/13 Faxed/Sent to the Next Level Care provider:  08/20/13 [facility] @ [phone]  Jerelene ReddenSheena E [place]aca, 08/20/2013, 4:06 PM

## 2013-12-10 ENCOUNTER — Emergency Department (HOSPITAL_COMMUNITY): Payer: Medicare Other

## 2013-12-10 ENCOUNTER — Encounter (HOSPITAL_COMMUNITY): Payer: Self-pay | Admitting: Vascular Surgery

## 2013-12-10 ENCOUNTER — Inpatient Hospital Stay (HOSPITAL_COMMUNITY)
Admission: EM | Admit: 2013-12-10 | Discharge: 2013-12-13 | DRG: 643 | Disposition: A | Discharge status: Home / Self Care | Payer: Medicare Other | Attending: Internal Medicine | Admitting: Internal Medicine

## 2013-12-10 ENCOUNTER — Inpatient Hospital Stay (HOSPITAL_COMMUNITY): Payer: Medicare Other

## 2013-12-10 DIAGNOSIS — Z87891 Personal history of nicotine dependence: Secondary | ICD-10-CM | POA: Diagnosis not present

## 2013-12-10 DIAGNOSIS — F32A Depression, unspecified: Secondary | ICD-10-CM | POA: Diagnosis present

## 2013-12-10 DIAGNOSIS — E222 Syndrome of inappropriate secretion of antidiuretic hormone: Secondary | ICD-10-CM | POA: Diagnosis present

## 2013-12-10 DIAGNOSIS — Z885 Allergy status to narcotic agent status: Secondary | ICD-10-CM

## 2013-12-10 DIAGNOSIS — K219 Gastro-esophageal reflux disease without esophagitis: Secondary | ICD-10-CM | POA: Diagnosis present

## 2013-12-10 DIAGNOSIS — Z7982 Long term (current) use of aspirin: Secondary | ICD-10-CM

## 2013-12-10 DIAGNOSIS — S2232XA Fracture of one rib, left side, initial encounter for closed fracture: Secondary | ICD-10-CM | POA: Diagnosis present

## 2013-12-10 DIAGNOSIS — F419 Anxiety disorder, unspecified: Secondary | ICD-10-CM | POA: Diagnosis present

## 2013-12-10 DIAGNOSIS — F319 Bipolar disorder, unspecified: Secondary | ICD-10-CM | POA: Diagnosis present

## 2013-12-10 DIAGNOSIS — F1910 Other psychoactive substance abuse, uncomplicated: Secondary | ICD-10-CM | POA: Diagnosis present

## 2013-12-10 DIAGNOSIS — F1020 Alcohol dependence, uncomplicated: Secondary | ICD-10-CM | POA: Diagnosis present

## 2013-12-10 DIAGNOSIS — E780 Pure hypercholesterolemia: Secondary | ICD-10-CM | POA: Diagnosis present

## 2013-12-10 DIAGNOSIS — G4700 Insomnia, unspecified: Secondary | ICD-10-CM | POA: Diagnosis present

## 2013-12-10 DIAGNOSIS — F1010 Alcohol abuse, uncomplicated: Secondary | ICD-10-CM | POA: Diagnosis present

## 2013-12-10 DIAGNOSIS — I10 Essential (primary) hypertension: Secondary | ICD-10-CM | POA: Diagnosis present

## 2013-12-10 DIAGNOSIS — I341 Nonrheumatic mitral (valve) prolapse: Secondary | ICD-10-CM | POA: Diagnosis present

## 2013-12-10 DIAGNOSIS — M479 Spondylosis, unspecified: Secondary | ICD-10-CM | POA: Diagnosis present

## 2013-12-10 DIAGNOSIS — N390 Urinary tract infection, site not specified: Secondary | ICD-10-CM | POA: Diagnosis present

## 2013-12-10 DIAGNOSIS — W19XXXA Unspecified fall, initial encounter: Secondary | ICD-10-CM | POA: Diagnosis present

## 2013-12-10 DIAGNOSIS — W1830XA Fall on same level, unspecified, initial encounter: Secondary | ICD-10-CM | POA: Diagnosis present

## 2013-12-10 DIAGNOSIS — Z888 Allergy status to other drugs, medicaments and biological substances status: Secondary | ICD-10-CM

## 2013-12-10 DIAGNOSIS — J189 Pneumonia, unspecified organism: Secondary | ICD-10-CM | POA: Diagnosis present

## 2013-12-10 DIAGNOSIS — R0602 Shortness of breath: Secondary | ICD-10-CM | POA: Diagnosis not present

## 2013-12-10 DIAGNOSIS — E785 Hyperlipidemia, unspecified: Secondary | ICD-10-CM | POA: Diagnosis present

## 2013-12-10 DIAGNOSIS — I2510 Atherosclerotic heart disease of native coronary artery without angina pectoris: Secondary | ICD-10-CM | POA: Diagnosis present

## 2013-12-10 DIAGNOSIS — R079 Chest pain, unspecified: Secondary | ICD-10-CM

## 2013-12-10 DIAGNOSIS — E871 Hypo-osmolality and hyponatremia: Secondary | ICD-10-CM | POA: Diagnosis present

## 2013-12-10 DIAGNOSIS — F329 Major depressive disorder, single episode, unspecified: Secondary | ICD-10-CM | POA: Diagnosis present

## 2013-12-10 HISTORY — DX: Unspecified convulsions: R56.9

## 2013-12-10 HISTORY — DX: Angina pectoris, unspecified: I20.9

## 2013-12-10 HISTORY — DX: Gastro-esophageal reflux disease without esophagitis: K21.9

## 2013-12-10 LAB — TROPONIN I

## 2013-12-10 LAB — CBC WITH DIFFERENTIAL/PLATELET
BASOS PCT: 0 % (ref 0–1)
Basophils Absolute: 0 10*3/uL (ref 0.0–0.1)
EOS ABS: 0 10*3/uL (ref 0.0–0.7)
EOS PCT: 0 % (ref 0–5)
HCT: 35.4 % — ABNORMAL LOW (ref 39.0–52.0)
HEMOGLOBIN: 12.2 g/dL — AB (ref 13.0–17.0)
Lymphocytes Relative: 14 % (ref 12–46)
Lymphs Abs: 2.2 10*3/uL (ref 0.7–4.0)
MCH: 26.8 pg (ref 26.0–34.0)
MCHC: 34.5 g/dL (ref 30.0–36.0)
MCV: 77.8 fL — AB (ref 78.0–100.0)
MONO ABS: 1.2 10*3/uL — AB (ref 0.1–1.0)
MONOS PCT: 8 % (ref 3–12)
NEUTROS PCT: 78 % — AB (ref 43–77)
Neutro Abs: 12.5 10*3/uL — ABNORMAL HIGH (ref 1.7–7.7)
Platelets: 218 10*3/uL (ref 150–400)
RBC: 4.55 MIL/uL (ref 4.22–5.81)
RDW: 15.6 % — ABNORMAL HIGH (ref 11.5–15.5)
WBC: 15.9 10*3/uL — ABNORMAL HIGH (ref 4.0–10.5)

## 2013-12-10 LAB — URINALYSIS, ROUTINE W REFLEX MICROSCOPIC
Bilirubin Urine: NEGATIVE
Glucose, UA: NEGATIVE mg/dL
Hgb urine dipstick: NEGATIVE
KETONES UR: 15 mg/dL — AB
LEUKOCYTES UA: NEGATIVE
NITRITE: NEGATIVE
PROTEIN: NEGATIVE mg/dL
Specific Gravity, Urine: 1.018 (ref 1.005–1.030)
Urobilinogen, UA: 0.2 mg/dL (ref 0.0–1.0)
pH: 5.5 (ref 5.0–8.0)

## 2013-12-10 LAB — COMPREHENSIVE METABOLIC PANEL
ALBUMIN: 3.9 g/dL (ref 3.5–5.2)
ALT: 37 U/L (ref 0–53)
AST: 57 U/L — ABNORMAL HIGH (ref 0–37)
Alkaline Phosphatase: 73 U/L (ref 39–117)
Anion gap: 18 — ABNORMAL HIGH (ref 5–15)
BUN: 29 mg/dL — ABNORMAL HIGH (ref 6–23)
CALCIUM: 9.4 mg/dL (ref 8.4–10.5)
CO2: 19 mEq/L (ref 19–32)
CREATININE: 1.14 mg/dL (ref 0.50–1.35)
Chloride: 73 mEq/L — ABNORMAL LOW (ref 96–112)
GFR calc Af Amer: 78 mL/min — ABNORMAL LOW (ref >90)
GFR calc non Af Amer: 67 mL/min — ABNORMAL LOW (ref >90)
GLUCOSE: 100 mg/dL — AB (ref 70–99)
POTASSIUM: 3.8 meq/L (ref 3.7–5.3)
Sodium: 110 mEq/L — CL (ref 137–147)
TOTAL PROTEIN: 7.9 g/dL (ref 6.0–8.3)
Total Bilirubin: 0.8 mg/dL (ref 0.3–1.2)

## 2013-12-10 LAB — ETHANOL: Alcohol, Ethyl (B): <11 mg/dL (ref 0–11)

## 2013-12-10 LAB — RAPID URINE DRUG SCREEN, HOSP PERFORMED
AMPHETAMINES: NOT DETECTED
BARBITURATES: NOT DETECTED
Benzodiazepines: POSITIVE — AB
Cocaine: NOT DETECTED
Opiates: NOT DETECTED
Tetrahydrocannabinol: NOT DETECTED

## 2013-12-10 LAB — PROTIME-INR
INR: 0.89 (ref 0.00–1.49)
PROTHROMBIN TIME: 12.2 s (ref 11.6–15.2)

## 2013-12-10 LAB — SODIUM, URINE, RANDOM: Sodium, Ur: <20 mEq/L

## 2013-12-10 MED ORDER — LORAZEPAM 2 MG/ML IJ SOLN
0.0000 mg | Freq: Two times a day (BID) | INTRAMUSCULAR | Status: DC
Start: 2013-12-12 — End: 2013-12-13

## 2013-12-10 MED ORDER — HYDROMORPHONE HCL 1 MG/ML IJ SOLN
0.5000 mg | Freq: Once | INTRAMUSCULAR | Status: AC
  Administered 2013-12-10: 0.5 mg via INTRAVENOUS
  Filled 2013-12-10: qty 1

## 2013-12-10 MED ORDER — HEPARIN SODIUM (PORCINE) 5000 UNIT/ML IJ SOLN
5000.0000 [IU] | Freq: Three times a day (TID) | INTRAMUSCULAR | Status: DC
  Administered 2013-12-10 – 2013-12-13 (×8): 5000 [IU] via SUBCUTANEOUS
  Filled 2013-12-10 (×11): qty 1

## 2013-12-10 MED ORDER — LORAZEPAM 2 MG/ML IJ SOLN: 1.0000 mg | Freq: Four times a day (QID) | INTRAMUSCULAR | Status: DC | PRN

## 2013-12-10 MED ORDER — LORAZEPAM 1 MG PO TABS: 1.0000 mg | ORAL_TABLET | Freq: Four times a day (QID) | ORAL | Status: DC | PRN

## 2013-12-10 MED ORDER — PANTOPRAZOLE SODIUM 40 MG PO TBEC
40.0000 mg | DELAYED_RELEASE_TABLET | Freq: Every day | ORAL | Status: DC
  Administered 2013-12-10 – 2013-12-13 (×4): 40 mg via ORAL
  Filled 2013-12-10 (×5): qty 1

## 2013-12-10 MED ORDER — HYDROMORPHONE HCL 1 MG/ML IJ SOLN
0.5000 mg | INTRAMUSCULAR | Status: DC | PRN
  Administered 2013-12-11 (×2): 0.5 mg via INTRAVENOUS
  Filled 2013-12-10 (×2): qty 1

## 2013-12-10 MED ORDER — ASPIRIN EC 325 MG PO TBEC
325.0000 mg | DELAYED_RELEASE_TABLET | Freq: Every morning | ORAL | Status: DC
  Administered 2013-12-11 – 2013-12-13 (×3): 325 mg via ORAL
  Filled 2013-12-10 (×4): qty 1

## 2013-12-10 MED ORDER — QUETIAPINE FUMARATE ER 50 MG PO TB24
250.0000 mg | ORAL_TABLET | Freq: Every day | ORAL | Status: DC
  Administered 2013-12-10: 250 mg via ORAL
  Filled 2013-12-10 (×2): qty 1

## 2013-12-10 MED ORDER — ADULT MULTIVITAMIN W/MINERALS CH
1.0000 | ORAL_TABLET | Freq: Every day | ORAL | Status: DC
  Administered 2013-12-10 – 2013-12-13 (×4): 1 via ORAL
  Filled 2013-12-10 (×5): qty 1

## 2013-12-10 MED ORDER — SODIUM CHLORIDE 0.9 % IV BOLUS (SEPSIS)
500.0000 mL | INTRAVENOUS | Status: AC
  Administered 2013-12-10: 500 mL via INTRAVENOUS

## 2013-12-10 MED ORDER — BUPROPION HCL ER (XL) 300 MG PO TB24
300.0000 mg | ORAL_TABLET | Freq: Every day | ORAL | Status: DC
  Administered 2013-12-10: 300 mg via ORAL
  Filled 2013-12-10 (×2): qty 1

## 2013-12-10 MED ORDER — SODIUM CHLORIDE 3 % IV SOLN
INTRAVENOUS | Status: DC
  Administered 2013-12-11: 50 mL/h via INTRAVENOUS
  Filled 2013-12-10: qty 500

## 2013-12-10 MED ORDER — LORAZEPAM 2 MG/ML IJ SOLN
0.0000 mg | Freq: Four times a day (QID) | INTRAMUSCULAR | Status: AC
  Administered 2013-12-11 (×2): 1 mg via INTRAVENOUS
  Filled 2013-12-10 (×2): qty 1

## 2013-12-10 MED ORDER — CEFTRIAXONE SODIUM IN DEXTROSE 20 MG/ML IV SOLN
1.0000 g | INTRAVENOUS | Status: DC
  Administered 2013-12-11 – 2013-12-12 (×3): 1 g via INTRAVENOUS
  Filled 2013-12-10 (×5): qty 50

## 2013-12-10 MED ORDER — VITAMIN B-1 100 MG PO TABS
100.0000 mg | ORAL_TABLET | Freq: Every day | ORAL | Status: DC
  Administered 2013-12-10 – 2013-12-13 (×3): 100 mg via ORAL
  Filled 2013-12-10 (×4): qty 1

## 2013-12-10 MED ORDER — SODIUM CHLORIDE 0.9 % IJ SOLN: 10.0000 mL | INTRAMUSCULAR | Status: DC | PRN

## 2013-12-10 MED ORDER — DEXTROSE 5 % IV SOLN
500.0000 mg | INTRAVENOUS | Status: DC
  Administered 2013-12-11 (×2): 500 mg via INTRAVENOUS
  Filled 2013-12-10 (×3): qty 500

## 2013-12-10 MED ORDER — SODIUM CHLORIDE 0.9 % IV BOLUS (SEPSIS)
500.0000 mL | Freq: Once | INTRAVENOUS | Status: AC
  Administered 2013-12-10: 500 mL via INTRAVENOUS

## 2013-12-10 MED ORDER — SODIUM CHLORIDE 0.9 % IV SOLN
INTRAVENOUS | Status: DC
  Administered 2013-12-10 – 2013-12-11 (×2): via INTRAVENOUS

## 2013-12-10 MED ORDER — THIAMINE HCL 100 MG/ML IJ SOLN
100.0000 mg | Freq: Every day | INTRAMUSCULAR | Status: DC
  Administered 2013-12-11: 100 mg via INTRAVENOUS
  Filled 2013-12-10 (×3): qty 1

## 2013-12-10 MED ORDER — FOLIC ACID 1 MG PO TABS
1.0000 mg | ORAL_TABLET | Freq: Every day | ORAL | Status: DC
  Administered 2013-12-10 – 2013-12-13 (×4): 1 mg via ORAL
  Filled 2013-12-10 (×5): qty 1

## 2013-12-10 MED ORDER — NITROGLYCERIN 0.4 MG SL SUBL: 0.4000 mg | SUBLINGUAL_TABLET | SUBLINGUAL | Status: DC | PRN

## 2013-12-10 MED ORDER — ATORVASTATIN CALCIUM 40 MG PO TABS
40.0000 mg | ORAL_TABLET | Freq: Every day | ORAL | Status: DC
  Administered 2013-12-10 – 2013-12-13 (×4): 40 mg via ORAL
  Filled 2013-12-10 (×5): qty 1

## 2013-12-10 NOTE — ED Notes (Signed)
Pharmacy reports 3% Na can only be infused through central line.

## 2013-12-10 NOTE — ED Notes (Addendum)
2 RNs have attempted IV without success. EDP aware and states he will attempt an U/S IV.

## 2013-12-10 NOTE — ED Notes (Signed)
Dr. Romeo AppleHarrison aware of Na+ 110, MD acknowledges, no new orders at this time.

## 2013-12-10 NOTE — ED Notes (Signed)
Dr. Niu is at the bedside 

## 2013-12-10 NOTE — ED Notes (Signed)
Dr. Clyde LundborgNiu would like bladder scan prior to transfer.

## 2013-12-10 NOTE — ED Notes (Signed)
Discussed with patient urine sample is needed. He reports no urge at this time.

## 2013-12-10 NOTE — Progress Notes (Signed)
Peripherally Inserted Central Catheter/Midline Placement  The IV Nurse has discussed with the patient and/or persons authorized to consent for the patient, the purpose of this procedure and the potential benefits and risks involved with this procedure.  The benefits include less needle sticks, lab draws from the catheter and patient may be discharged home with the catheter.  Risks include, but not limited to, infection, bleeding, blood clot (thrombus formation), and puncture of an artery; nerve damage and irregular heat beat.  Alternatives to this procedure were also discussed. Inserted by Rhae LernerJessica Ruvolis RN.  PICC/Midline Placement Documentation        Christeen Douglashomas, Shuntae Herzig L 12/10/2013, 11:06 PM

## 2013-12-10 NOTE — ED Notes (Signed)
Dr. Clyde LundborgNiu at the bedside, due to low sodium he would like I&O tracked. Verbal order for foley cathater.

## 2013-12-10 NOTE — ED Notes (Addendum)
Pt reports to the ED for eval of dizziness and feeling "foggy" since he fell on Sunday. Reports he tripped in the bathroom and struck his  forehead on the concrete floor. Denies LOC. Pt denies being on blood thinners or ASA q day. Pt also reports some mild SOB that began Monday. Reports he has some chest discomfort but reports he always has that r/t mitral valve prolapse. Pt sinus tach on the monitor. Pt reports he has been feeling disoriented and nauseated. Denies any active vomiting. Pt has ecchymosis to the right forehead and left arm and back. Pt A&Ox3, resp e/u, and skin warm and dry. He is disoriented to time.

## 2013-12-10 NOTE — ED Notes (Signed)
Dr. Romeo AppleHarrison allows patient to have drink at this time, provided ginger ale.

## 2013-12-10 NOTE — ED Provider Notes (Addendum)
CSN: [id number]     Arrival date & time 12/10/13  1618 History   First MD Initiated Contact with Patient 12/10/13 1636     Chief Complaint  Patient presents with  . Dizziness     (Consider location/radiation/quality/duration/timing/severity/associated sxs/prior Treatment) Patient is a 63 y.o. male presenting with dizziness. The history is provided by the patient.  Dizziness Quality:  Imbalance Severity:  Mild Onset quality:  Sudden Duration:  4 days Timing:  Constant Progression:  Unchanged Chronicity:  New Context: physical activity and standing up   Relieved by:  Being still Worsened by:  Nothing tried Ineffective treatments:  None tried Associated symptoms: chest pain and headaches   Associated symptoms: no diarrhea, no nausea, no shortness of breath and no vomiting   Chest pain:    Quality:  Pressure   Severity:  Mild   Onset quality:  Gradual   Duration:  4 days   Timing:  Constant   Progression:  Unchanged   Chronicity:  Recurrent   Past Medical History  Diagnosis Date  . MVP (mitral valve prolapse)   . Anxiety   . Hypertension   . Hyperlipidemia   . Palpitations   . Bipolar disorder   . Depression   . Esophagitis   . Gastritis   . Insomnia   . Seizures   . Pneumonia 2005  . Arthritis   . Hypercholesteremia   . Coronary artery disease     a.  s/p PCI to RCA in 2007;  b.  Last LHC 04/2008:  LM with irregularities, LAD 50%, mid LAD 50%, proximal D1 80-90%, proximal OM1 30-40%, proximal OM2 50%, PLB branch 30-40%, site of prior angioplasty in the RCA widely patent, EF 65%=>Med Rx;  c. Nuclear study a 12/2011: EF 71% and normal perfusion.    Marland Kitchen History of echocardiogram     Echo 08/16/09: Mild LVH, EF 55-60%, no normal wall motion, grade 1 diastolic dysfunction   Past Surgical History  Procedure Laterality Date  . Coronary angioplasty  2007    RCA  . Cardiac catheterization  04/2008    EF 65%  . Knee arthroscopy, medial patello femoral ligament repair     . Hip arthroplasty  bilateral   . Shoulder arthroscopy  right  . Tonsillectomy    . Total hip revision  02/07/2012    Procedure: TOTAL HIP REVISION;  Surgeon: [name], MD;  Location: [facility] OR;  Service: Orthopedics;  Laterality: Left;  [place] Hip Revision Set  . Right knee surgery     Family History  Problem Relation Age of Onset  . Heart disease Neg Hx   . Prostate cancer Father    History  Substance Use Topics  . Smoking status: Former Smoker -- 1.50 packs/day for 15 years    Types: Cigarettes    Quit date: 08/12/2013  . Smokeless tobacco: Not on file  . Alcohol Use: 0.0 oz/week     Comment: 1 case of beer daily    Review of Systems  Constitutional: Negative for fever.  HENT: Negative for drooling and rhinorrhea.   Eyes: Negative for pain.  Respiratory: Negative for cough and shortness of breath.   Cardiovascular: Positive for chest pain. Negative for leg swelling.  Gastrointestinal: Negative for nausea, vomiting, abdominal pain and diarrhea.  Genitourinary: Negative for dysuria and hematuria.  Musculoskeletal: Negative for gait problem and neck pain.  Skin: Negative for color change.  Neurological: Positive for dizziness and headaches. Negative for numbness.  Hematological: Negative for  adenopathy.  Psychiatric/Behavioral: Negative for behavioral problems.  All other systems reviewed and are negative.     Allergies  Ambien and Codeine  Home Medications   Prior to Admission medications   Medication Sig Start Date End Date Taking? Authorizing Provider  aspirin EC 325 MG tablet Take 1 tablet (325 mg total) by mouth every morning. For blood thinner for heart health 08/15/13  Yes Nanine MeansJamison Lord, NP  atorvastatin (LIPITOR) 40 MG tablet Take 1 tablet (40 mg total) by mouth daily. For cholesterol 10/24/12  Yes Sanjuana KavaAgnes I Nwoko, NP  buPROPion (WELLBUTRIN XL) 300 MG 24 hr tablet Take 1 tablet (300 mg total) by mouth daily. For depression 10/24/12  Yes Sanjuana KavaAgnes I Nwoko, NP   cloNIDine (CATAPRES) 0.1 MG tablet Take 1 tablet (0.1 mg total) by mouth 2 (two) times daily. 08/15/13  Yes Nanine MeansJamison Lord, NP  hydrOXYzine (ATARAX/VISTARIL) 25 MG tablet Take 1 tablet (25 mg total) by mouth every 6 (six) hours as needed for anxiety. 10/24/12  Yes Sanjuana KavaAgnes I Nwoko, NP  metoprolol succinate (TOPROL-XL) 25 MG 24 hr tablet Take 1 tablet (25 mg total) by mouth daily. 08/15/13  Yes Nanine MeansJamison Lord, NP  nitroGLYCERIN (NITROSTAT) 0.4 MG SL tablet Place 1 tablet (0.4 mg total) under the tongue every 5 (five) minutes as needed for chest pain (CP or SOB). 10/24/12  Yes Sanjuana KavaAgnes I Nwoko, NP  pantoprazole (PROTONIX) 40 MG tablet Take 1 tablet (40 mg total) by mouth daily. 08/15/13  Yes Nanine MeansJamison Lord, NP  QUEtiapine (SEROQUEL XR) 50 MG TB24 24 hr tablet Take 5 tablets (250 mg total) by mouth at bedtime. 08/15/13  Yes Nanine MeansJamison Lord, NP   BP 106/64 mmHg  Pulse 98  Temp(Src) 97.4 F (36.3 C) (Oral)  Resp 14  Ht 5\' 10"  (1.778 m)  Wt 170 lb (77.111 kg)  BMI 24.39 kg/m2  SpO2 100% Physical Exam  Constitutional: He is oriented to person, place, and time. He appears well-developed and well-nourished.  HENT:  Right Ear: External ear normal.  Left Ear: External ear normal.  Nose: Nose normal.  Mouth/Throat: Oropharynx is clear and moist. No oropharyngeal exudate.  Mild bruising to the central forehead.  Eyes: Conjunctivae and EOM are normal. Pupils are equal, round, and reactive to light.  Neck: Normal range of motion. Neck supple.  No focal vertebral tenderness.  Cardiovascular: Normal rate, regular rhythm, normal heart sounds and intact distal pulses.  Exam reveals no gallop and no friction rub.   No murmur heard. Pulmonary/Chest: Effort normal and breath sounds normal. No respiratory distress. He has no wheezes.  Abdominal: Soft. Bowel sounds are normal. He exhibits no distension. There is no tenderness. There is no rebound and no guarding.  Musculoskeletal: Normal range of motion. He exhibits tenderness.  He exhibits no edema.  Bruising and mild swelling to the dorsal surface of the left hand.  Bruising to the right scapular area.  Neurological: He is alert and oriented to person, place, and time.  Normal strength in the upper extremities. 3 out of 5 strength in the lower extremities.  Normal sensation throughout.  Cranial nerves grossly intact.  Normal finger to nose bilaterally.  Gait deferred due to left hip pain.  Skin: Skin is warm and dry.  Psychiatric: He has a normal mood and affect. His behavior is normal.  Nursing note and vitals reviewed.   ED Course  Procedures (including critical care time) Labs Review Labs Reviewed  CBC WITH DIFFERENTIAL - Abnormal; Notable for the following:  WBC 15.9 (*)    Hemoglobin 12.2 (*)    HCT 35.4 (*)    MCV 77.8 (*)    RDW 15.6 (*)    Neutrophils Relative % 78 (*)    Neutro Abs 12.5 (*)    Monocytes Absolute 1.2 (*)    All other components within normal limits  COMPREHENSIVE METABOLIC PANEL - Abnormal; Notable for the following:    Sodium 110 (*)    Chloride 73 (*)    Glucose, Bld 100 (*)    BUN 29 (*)    AST 57 (*)    GFR calc non Af Amer 67 (*)    GFR calc Af Amer 78 (*)    Anion gap 18 (*)    All other components within normal limits  URINALYSIS, ROUTINE W REFLEX MICROSCOPIC - Abnormal; Notable for the following:    Color, Urine AMBER (*)    Ketones, ur 15 (*)    All other components within normal limits  URINE RAPID DRUG SCREEN (HOSP PERFORMED) - Abnormal; Notable for the following:    Benzodiazepines POSITIVE (*)    All other components within normal limits  URINE CULTURE  CULTURE, BLOOD (ROUTINE X 2)  CULTURE, BLOOD (ROUTINE X 2)  CULTURE, EXPECTORATED SPUTUM-ASSESSMENT  GRAM STAIN  RESPIRATORY VIRUS PANEL  TROPONIN I  PROTIME-INR  ETHANOL  SODIUM, URINE, RANDOM  OSMOLALITY, URINE  OSMOLALITY  TSH  LEGIONELLA ANTIGEN, URINE  STREP PNEUMONIAE URINARY ANTIGEN  PROTIME-INR  COMPREHENSIVE METABOLIC PANEL   CBC WITH DIFFERENTIAL  BASIC METABOLIC PANEL    Imaging Review Dg Chest 2 View  12/10/2013   CLINICAL DATA:  Dizziness, fell from standing position 4 days ago, chest pain, LEFT shoulder pain, personal history of coronary artery disease, hypertension  EXAM: CHEST  2 VIEW  COMPARISON:  08/09/2013  FINDINGS: Mild enlargement of cardiac silhouette.  Mildly tortuous aorta.  Mediastinal contours and pulmonary vascularity normal.  Mild infiltrate in RIGHT upper lobe.  Minimal atelectasis or infiltrate in LEFT mid lung.  Remaining lungs clear.  No pleural effusion or pneumothorax.  Bones appear demineralized.  Probable fracture anterior LEFT fifth rib.  Old healed fracture posterior RIGHT eighth and questionably ninth ribs.  IMPRESSION: Mild enlargement of cardiac silhouette.  Mild RIGHT upper lobe infiltrate with mild atelectasis versus infiltrate in LEFT mid lung.  Suspected acute fracture anterior LEFT fifth rib.   Electronically Signed   By: Ulyses SouthwardMark  Boles M.D.   On: 12/10/2013 18:58   Dg Hip Complete Left  12/10/2013   CLINICAL DATA:  Fall from standing position with left hip pain, initial encounter  EXAM: LEFT HIP - COMPLETE 2+ VIEW  COMPARISON:  None.  FINDINGS: Bilateral hip prostheses are seen. No will fracture or loosening is identified. No dislocation of the femoral components is seen. No gross soft tissue abnormality is noted.  IMPRESSION: Postsurgical change without acute abnormality.   Electronically Signed   By: Alcide CleverMark  Lukens M.D.   On: 12/10/2013 18:58   Ct Head Wo Contrast  12/10/2013   CLINICAL DATA:  Dizziness and mental status changes since a fall on Sunday. Reports he tripped in the bathroom and struck his forehead on the concrete floor. Denies LOC  EXAM: CT HEAD WITHOUT CONTRAST  CT CERVICAL SPINE WITHOUT CONTRAST  TECHNIQUE: Multidetector CT imaging of the head and cervical spine was performed following the standard protocol without intravenous contrast. Multiplanar CT image  reconstructions of the cervical spine were also generated.  COMPARISON:  07/31/2011  FINDINGS: CT  HEAD FINDINGS  Sinuses/Soft tissues: Right nasal bone fracture and left nasal bone subluxation medially. Unchanged and without overlying soft tissue swelling.  Mucosal thickening of the right maxillary sinus is improved. Partial opacification of the right frontal sinus is chronic. Clear mastoid air cells. No skull fracture.  Intracranial: Age advanced cerebral atrophy. Moderate low density in the periventricular white matter likely related to small vessel disease. Carotid and vertebral basilar atherosclerosis bilaterally.  CT CERVICAL SPINE FINDINGS  Spinal visualization through the bottom of T1. Prevertebral soft tissues are within normal limits. Left greater than right carotid atherosclerosis. Right apical pleural parenchymal scarring. No apical pneumothorax. Multilevel spondylosis. Left greater than right neural foraminal narrowing at C3-4 secondary to uncovertebral joint hypertrophy. Left-sided narrowing at C4-5 with left greater than right narrowing at C5-6. Also due to uncovertebral joint hypertrophy.  Maintenance of vertebral body height and alignment. Advanced degenerative disc disease at C4 through T1. Extensive facet arthropathy. Coronal reformats demonstrate a normal C1-C2 articulation.  IMPRESSION: 1. No acute intracranial abnormality. Small vessel ischemic change. 2.  No acute fracture or subluxation within the cervical spine. 3. Advanced spondylosis.   Electronically Signed   By: Jeronimo Greaves M.D.   On: 12/10/2013 18:44   Ct Cervical Spine Wo Contrast  12/10/2013   CLINICAL DATA:  Dizziness and mental status changes since a fall on Sunday. Reports he tripped in the bathroom and struck his forehead on the concrete floor. Denies LOC  EXAM: CT HEAD WITHOUT CONTRAST  CT CERVICAL SPINE WITHOUT CONTRAST  TECHNIQUE: Multidetector CT imaging of the head and cervical spine was performed following the standard  protocol without intravenous contrast. Multiplanar CT image reconstructions of the cervical spine were also generated.  COMPARISON:  07/31/2011  FINDINGS: CT HEAD FINDINGS  Sinuses/Soft tissues: Right nasal bone fracture and left nasal bone subluxation medially. Unchanged and without overlying soft tissue swelling.  Mucosal thickening of the right maxillary sinus is improved. Partial opacification of the right frontal sinus is chronic. Clear mastoid air cells. No skull fracture.  Intracranial: Age advanced cerebral atrophy. Moderate low density in the periventricular white matter likely related to small vessel disease. Carotid and vertebral basilar atherosclerosis bilaterally.  CT CERVICAL SPINE FINDINGS  Spinal visualization through the bottom of T1. Prevertebral soft tissues are within normal limits. Left greater than right carotid atherosclerosis. Right apical pleural parenchymal scarring. No apical pneumothorax. Multilevel spondylosis. Left greater than right neural foraminal narrowing at C3-4 secondary to uncovertebral joint hypertrophy. Left-sided narrowing at C4-5 with left greater than right narrowing at C5-6. Also due to uncovertebral joint hypertrophy.  Maintenance of vertebral body height and alignment. Advanced degenerative disc disease at C4 through T1. Extensive facet arthropathy. Coronal reformats demonstrate a normal C1-C2 articulation.  IMPRESSION: 1. No acute intracranial abnormality. Small vessel ischemic change. 2.  No acute fracture or subluxation within the cervical spine. 3. Advanced spondylosis.   Electronically Signed   By: Jeronimo Greaves M.D.   On: 12/10/2013 18:44   Dg Shoulder Left  12/10/2013   CLINICAL DATA:  Fall from standing position with left shoulder pain, initial encounter  EXAM: LEFT SHOULDER - 2+ VIEW  COMPARISON:  None.  FINDINGS: There is no evidence of fracture or dislocation. There is no evidence of arthropathy or other focal bone abnormality. Soft tissues are  unremarkable.  IMPRESSION: No acute abnormality noted.   Electronically Signed   By: Alcide Clever M.D.   On: 12/10/2013 18:58   Dg Hand Complete Left  12/10/2013  CLINICAL DATA:  Fall from standing position with left hand pain, initial encounter  EXAM: LEFT HAND - COMPLETE 3+ VIEW  COMPARISON:  None.  FINDINGS: No acute fracture or dislocation is noted. Widening of the distal radius is noted consistent with prior fracture and healing. No gross soft tissue abnormality is seen.  IMPRESSION: No acute abnormality noted.   Electronically Signed   By: Alcide Clever M.D.   On: 12/10/2013 18:57     EKG Interpretation   Date/Time:  Wednesday December 10 2013 16:22:41 EST Ventricular Rate:  103 PR Interval:  208 QRS Duration: 95 QT Interval:  345 QTC Calculation: 452 R Axis:   -151 Text Interpretation:  Sinus tachycardia Borderline prolonged PR interval  Probable left atrial enlargement Inferior infarct, old Probable  anterolateral infarct, old Baseline wander in lead(s) V1 No significant  change since last tracing Confirmed by Ladonya Jerkins  MD, Treveon Bourcier ([id number]) on  12/10/2013 4:32:27 PM     CRITICAL CARE Performed by: Purvis Sheffield, S Total critical care time: 30 min Critical care time was exclusive of separately billable procedures and treating other patients. Critical care was necessary to treat or prevent imminent or life-threatening deterioration. Critical care was time spent personally by me on the following activities: development of treatment plan with patient and/or surrogate as well as nursing, discussions with consultants, evaluation of patient's response to treatment, examination of patient, obtaining history from patient or surrogate, ordering and performing treatments and interventions, ordering and review of laboratory studies, ordering and review of radiographic studies, pulse oximetry and re-evaluation of patient's condition.  Procedure note: Ultrasound Guided Peripheral  IV Ultrasound guided 20 g peripheral 1.88 inch angiocath IV placement performed by me. Indications: Nursing unable to place IV. Details: The right antecubital fossa and upper arm was evaluated with a multifrequency linear probe. Several patent brachial veins are noted. 2 attempts were made to cannulate a right antecubital vein under realtime US guidance with successful cannulation of the vein and catheter placement. There is return of non-pulsatile dark red blood. The patient tolerated the procedure well without complications. An ultrasound image is archived.    MDM   Final diagnoses:  Chest pain  Fall  Left rib fracture, closed, initial encounter  Hyponatremia    5:52 PM 63 y.o. male w hx of MVP, CAD, hx of etoh abuse (quit 3 mos ago) who presents after a mechanical fall which occurred 3 days ago in his bathroom. He states that he hit his forehead on the ground. He does not believe he had loss of consciousness. Since then he has felt "foggy" and had some balance issues may be relating. He is also had constant upper chest pressure since he fell. He denies any fevers, vomiting, or diarrhea. He is afebrile and vital signs are unremarkable here. We'll get screening labs and imaging.  Will admit to hospitalist for hyponatremia.     Purvis Sheffield, MD 12/10/13 1610  Purvis Sheffield, MD 12/11/13 1145

## 2013-12-10 NOTE — Progress Notes (Signed)
Report received from [place], [place].

## 2013-12-10 NOTE — ED Notes (Signed)
Bladder scan performed, 502mL retained. Discussed with Dr. Clyde LundborgNiu, patient is to receive in and out at this time.

## 2013-12-11 ENCOUNTER — Encounter (HOSPITAL_COMMUNITY): Payer: Self-pay | Admitting: General Practice

## 2013-12-11 LAB — STREP PNEUMONIAE URINARY ANTIGEN: Strep Pneumo Urinary Antigen: NEGATIVE

## 2013-12-11 LAB — COMPREHENSIVE METABOLIC PANEL
ALT: 27 U/L (ref 0–53)
AST: 37 U/L (ref 0–37)
Albumin: 3 g/dL — ABNORMAL LOW (ref 3.5–5.2)
Alkaline Phosphatase: 54 U/L (ref 39–117)
Anion gap: 14 (ref 5–15)
BILIRUBIN TOTAL: 0.5 mg/dL (ref 0.3–1.2)
BUN: 27 mg/dL — ABNORMAL HIGH (ref 6–23)
CHLORIDE: 85 meq/L — AB (ref 96–112)
CO2: 23 meq/L (ref 19–32)
CREATININE: 1.01 mg/dL (ref 0.50–1.35)
Calcium: 8.2 mg/dL — ABNORMAL LOW (ref 8.4–10.5)
GFR calc Af Amer: >90 mL/min (ref >90)
GFR, EST NON AFRICAN AMERICAN: 78 mL/min — AB (ref >90)
Glucose, Bld: 97 mg/dL (ref 70–99)
Potassium: 3.4 mEq/L — ABNORMAL LOW (ref 3.7–5.3)
Sodium: 122 mEq/L — ABNORMAL LOW (ref 137–147)
Total Protein: 5.9 g/dL — ABNORMAL LOW (ref 6.0–8.3)

## 2013-12-11 LAB — CBC WITH DIFFERENTIAL/PLATELET
BASOS ABS: 0 10*3/uL (ref 0.0–0.1)
BASOS PCT: 0 % (ref 0–1)
EOS ABS: 0.2 10*3/uL (ref 0.0–0.7)
EOS PCT: 1 % (ref 0–5)
HCT: 26.7 % — ABNORMAL LOW (ref 39.0–52.0)
Hemoglobin: 9.3 g/dL — ABNORMAL LOW (ref 13.0–17.0)
LYMPHS ABS: 2 10*3/uL (ref 0.7–4.0)
Lymphocytes Relative: 18 % (ref 12–46)
MCH: 27.1 pg (ref 26.0–34.0)
MCHC: 34.8 g/dL (ref 30.0–36.0)
MCV: 77.8 fL — AB (ref 78.0–100.0)
Monocytes Absolute: 1.3 10*3/uL — ABNORMAL HIGH (ref 0.1–1.0)
Monocytes Relative: 11 % (ref 3–12)
NEUTROS PCT: 70 % (ref 43–77)
Neutro Abs: 7.9 10*3/uL — ABNORMAL HIGH (ref 1.7–7.7)
PLATELETS: 175 10*3/uL (ref 150–400)
RBC: 3.43 MIL/uL — ABNORMAL LOW (ref 4.22–5.81)
RDW: 15.5 % (ref 11.5–15.5)
WBC: 11.4 10*3/uL — AB (ref 4.0–10.5)

## 2013-12-11 LAB — BASIC METABOLIC PANEL
ANION GAP: 13 (ref 5–15)
ANION GAP: 16 — AB (ref 5–15)
ANION GAP: 14 (ref 5–15)
Anion gap: 13 (ref 5–15)
Anion gap: 14 (ref 5–15)
BUN: 20 mg/dL (ref 6–23)
BUN: 25 mg/dL — ABNORMAL HIGH (ref 6–23)
BUN: 23 mg/dL (ref 6–23)
BUN: 22 mg/dL (ref 6–23)
BUN: 29 mg/dL — AB (ref 6–23)
CALCIUM: 8.6 mg/dL (ref 8.4–10.5)
CHLORIDE: 85 meq/L — AB (ref 96–112)
CHLORIDE: 77 meq/L — AB (ref 96–112)
CO2: 20 mEq/L (ref 19–32)
CO2: 20 mEq/L (ref 19–32)
CO2: 23 mEq/L (ref 19–32)
CO2: 22 mEq/L (ref 19–32)
CO2: 22 mEq/L (ref 19–32)
CREATININE: 0.95 mg/dL (ref 0.50–1.35)
CREATININE: 0.95 mg/dL (ref 0.50–1.35)
CREATININE: 1.04 mg/dL (ref 0.50–1.35)
CREATININE: 0.96 mg/dL (ref 0.50–1.35)
Calcium: 8.2 mg/dL — ABNORMAL LOW (ref 8.4–10.5)
Calcium: 8.3 mg/dL — ABNORMAL LOW (ref 8.4–10.5)
Calcium: 8.5 mg/dL (ref 8.4–10.5)
Calcium: 8.5 mg/dL (ref 8.4–10.5)
Chloride: 85 mEq/L — ABNORMAL LOW (ref 96–112)
Chloride: 85 mEq/L — ABNORMAL LOW (ref 96–112)
Chloride: 87 mEq/L — ABNORMAL LOW (ref 96–112)
Creatinine, Ser: 1.05 mg/dL (ref 0.50–1.35)
GFR calc non Af Amer: 87 mL/min — ABNORMAL LOW (ref >90)
GFR calc non Af Amer: 87 mL/min — ABNORMAL LOW (ref >90)
GFR, EST AFRICAN AMERICAN: 87 mL/min — AB (ref >90)
GFR, EST AFRICAN AMERICAN: 86 mL/min — AB (ref >90)
GFR, EST NON AFRICAN AMERICAN: 75 mL/min — AB (ref >90)
GFR, EST NON AFRICAN AMERICAN: 87 mL/min — AB (ref >90)
GFR, EST NON AFRICAN AMERICAN: 74 mL/min — AB (ref >90)
Glucose, Bld: 93 mg/dL (ref 70–99)
Glucose, Bld: 96 mg/dL (ref 70–99)
Glucose, Bld: 107 mg/dL — ABNORMAL HIGH (ref 70–99)
Glucose, Bld: 91 mg/dL (ref 70–99)
Glucose, Bld: 101 mg/dL — ABNORMAL HIGH (ref 70–99)
POTASSIUM: 3.5 meq/L — AB (ref 3.7–5.3)
POTASSIUM: 3.6 meq/L — AB (ref 3.7–5.3)
Potassium: 3.6 mEq/L — ABNORMAL LOW (ref 3.7–5.3)
Potassium: 4 mEq/L (ref 3.7–5.3)
Potassium: 3.9 mEq/L (ref 3.7–5.3)
Sodium: 111 mEq/L — CL (ref 137–147)
Sodium: 121 mEq/L — CL (ref 137–147)
Sodium: 121 mEq/L — CL (ref 137–147)
Sodium: 121 mEq/L — CL (ref 137–147)
Sodium: 122 mEq/L — ABNORMAL LOW (ref 137–147)

## 2013-12-11 LAB — LEGIONELLA ANTIGEN, URINE

## 2013-12-11 LAB — GLUCOSE, CAPILLARY: Glucose-Capillary: 175 mg/dL — ABNORMAL HIGH (ref 70–99)

## 2013-12-11 LAB — PROTIME-INR
INR: 0.97 (ref 0.00–1.49)
Prothrombin Time: 12.9 seconds (ref 11.6–15.2)

## 2013-12-11 LAB — OSMOLALITY: Osmolality: 244 mOsm/kg — ABNORMAL LOW (ref 275–300)

## 2013-12-11 LAB — TSH: TSH: 1.41 u[IU]/mL (ref 0.350–4.500)

## 2013-12-11 LAB — OSMOLALITY, URINE: Osmolality, Ur: 514 mosm/kg (ref 390–1090)

## 2013-12-11 LAB — MRSA PCR SCREENING: MRSA by PCR: NEGATIVE

## 2013-12-11 MED ORDER — ALBUTEROL SULFATE (2.5 MG/3ML) 0.083% IN NEBU: 2.5000 mg | INHALATION_SOLUTION | RESPIRATORY_TRACT | Status: DC | PRN

## 2013-12-11 MED ORDER — CLONAZEPAM 0.5 MG PO TABS: 1.0000 mg | ORAL_TABLET | Freq: Two times a day (BID) | ORAL | Status: DC

## 2013-12-11 MED ORDER — POTASSIUM CHLORIDE CRYS ER 20 MEQ PO TBCR
40.0000 meq | EXTENDED_RELEASE_TABLET | Freq: Once | ORAL | Status: AC
  Administered 2013-12-11: 40 meq via ORAL
  Filled 2013-12-11: qty 2

## 2013-12-11 MED ORDER — CHLORDIAZEPOXIDE HCL 5 MG PO CAPS
5.0000 mg | ORAL_CAPSULE | Freq: Three times a day (TID) | ORAL | Status: DC
  Administered 2013-12-11 – 2013-12-13 (×7): 5 mg via ORAL
  Filled 2013-12-11 (×7): qty 1

## 2013-12-11 NOTE — Evaluation (Signed)
Occupational Therapy Evaluation Patient Details Name: [name] BanDavid M [name] MRN: [id number] DOB: [birth year] Today's Date: 12/11/2013    History of Present Illness This 63 y.o. male admitted with SOB, Rt flank pain, and fall ~ 3 days PTA.  Dx;  CAP ; x-ray of chest with possible Lt. 5th rib fracture; hyponatremia; head CT negative.  PMH: includes ETOH abuse; bipolar disorder; CAD; GERD; s/p bil. THA   Clinical Impression   Pt admitted with above. He demonstrates the below listed deficits and will benefit from continued OT to maximize safety and independence with BADLs.  Pt presents to OT with generalized weakness, impaired balance, and Lt flank pain which limits ADLs.  Currently, he requires min A for BADLs, and min A/HHA for functional mobility.  He is disoriented to time - thinks it's Oct, but otherwise, cognition appears intact.      Follow Up Recommendations  No OT follow up;Supervision/Assistance - 24 hour (initially )    Equipment Recommendations  None recommended by OT    Recommendations for Other Services       Precautions / Restrictions Precautions Precautions: Fall Restrictions Weight Bearing Restrictions: No      Mobility Bed Mobility Overal bed mobility: Needs Assistance Bed Mobility: Rolling;Sidelying to Sit Rolling: Min assist Sidelying to sit: Min assist       General bed mobility comments: assist to roll fully to Rt and assist to lift shoulders  Transfers Overall transfer level: Needs assistance Equipment used: 1 person hand held assist Transfers: Sit to/from Stand;Stand Pivot Transfers Sit to Stand: Min assist Stand pivot transfers: Min guard       General transfer comment: min A for balance      Balance Overall balance assessment: Needs assistance Sitting-balance support: Feet supported Sitting balance-Leahy Scale: Good     Standing balance support: During functional activity Standing balance-Leahy Scale: Fair Standing balance comment: Requires  unilateral UE support                            ADL Overall ADL's : Needs assistance/impaired Eating/Feeding: Independent;Sitting   Grooming: Wash/dry hands;Wash/dry face;Oral care;Applying deodorant;Brushing hair;Min guard;Standing   Upper Body Bathing: Set up;Sitting   Lower Body Bathing: Minimal assistance;Sit to/from stand   Upper Body Dressing : Set up;Sitting   Lower Body Dressing: Minimal assistance;Sit to/from stand   Toilet Transfer: Ambulation;Minimal assistance   Toileting- ArchitectClothing Manipulation and Hygiene: Sit to/from stand;Minimal assistance       Functional mobility during ADLs: Min guard (HHA) General ADL Comments: Pt unable to fully access feet for LB ADLs due to lt flank pain.  he requires min guard assist for balance      Vision                     Perception     Praxis      Pertinent Vitals/Pain Pain Assessment: 0-10 Pain Score: 5  Pain Location: Lt chest  Pain Descriptors / Indicators: Aching Pain Intervention(s): Monitored during session     Hand Dominance Right   Extremity/Trunk Assessment Upper Extremity Assessment Upper Extremity Assessment: Overall WFL for tasks assessed   Lower Extremity Assessment Lower Extremity Assessment: Defer to PT evaluation       Communication Communication Communication: No difficulties   Cognition Arousal/Alertness: Awake/alert Behavior During Therapy: WFL for tasks assessed/performed Overall Cognitive Status: Impaired/Different from baseline Area of Impairment: Orientation Orientation Level: Time  General Comments       Exercises       Shoulder Instructions      Home Living Family/patient expects to be discharged to:: Private residence Living Arrangements: Alone Available Help at Discharge: Family;Available PRN/intermittently Type of Home: Apartment       Home Layout: One level     Bathroom Shower/Tub: Tub/shower unit;Curtain Shower/tub  characteristics: Engineer, building servicesCurtain Bathroom Toilet: Standard     Home Equipment: Cane - single point          Prior Functioning/Environment Level of Independence: Independent        Comments: Pt reports he goes to gym at least 3 days/wk.  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      OT Diagnosis: Generalized weakness;Acute pain   OT Problem List: Decreased strength;Decreased activity tolerance;Impaired balance (sitting and/or standing);Decreased knowledge of use of DME or AE;Pain   OT Treatment/Interventions: Self-care/ADL training;DME and/or AE instruction;Therapeutic activities;Patient/family education;Balance training    OT Goals(Current goals can be found in the care plan section) Acute Rehab OT Goals Patient Stated Goal: to get stronger and go home  OT Goal Formulation: With patient Time For Goal Achievement: 12/25/13 Potential to Achieve Goals: Good ADL Goals Pt Will Perform Grooming: with modified independence;standing Pt Will Perform Upper Body Bathing: with modified independence;sitting Pt Will Perform Lower Body Bathing: with modified independence;sit to/from stand Pt Will Perform Upper Body Dressing: with modified independence;sitting Pt Will Perform Lower Body Dressing: with modified independence;sit to/from stand Pt Will Transfer to Toilet: with modified independence;ambulating;regular height toilet;grab bars Pt Will Perform Toileting - Clothing Manipulation and hygiene: with modified independence;sit to/from stand Pt Will Perform Tub/Shower Transfer: Tub transfer;with supervision;ambulating  OT Frequency: Min 2X/week   Barriers to D/C:            Co-evaluation              End of Session Equipment Utilized During Treatment: Gait belt Nurse Communication: Mobility status  Activity Tolerance: Patient tolerated treatment well Patient left: in chair;with call bell/phone within reach   Time: 1201-1238 OT Time Calculation (min): 37 min Charges:   OT General Charges $OT Visit: 1 Procedure OT Evaluation $Initial OT Evaluation Tier I: 1 Procedure OT Treatments $Self Care/Home Management : 8-22 mins $Therapeutic Activity: 8-22 mins G-Codes:    Nobuko Gsell M 12/11/2013, 1:00 PM

## 2013-12-11 NOTE — Progress Notes (Signed)
CRITICAL VALUE ALERT  Critical value received: Sodium 111  Date of notification: 12/11/13  Time of notification: 0108  Critical value read back: Yes  Nurse who received alert: [place] Klay Sobotka, RN  MD notified (1st page): Jarvis MorganNiu, X  Time of first page: 0108  MD notified (2nd page): Jarvis MorganNiu, X  Time of second page: 0142  Responding MD: Jarvis MorganNiu, X  Time MD responded: [phone]

## 2013-12-11 NOTE — Progress Notes (Signed)
UR Completed.  [name] [phone] 12/11/2013  

## 2013-12-11 NOTE — Evaluation (Signed)
Physical Therapy Evaluation Patient Details Name: Kandis BanDavid M Tiggs MRN: [id number] DOB: Oct 22, 1950 Today's Date: 12/11/2013   History of Present Illness  This 63 y.o. male admitted with SOB, Rt flank pain, and fall ~ 3 days PTA.  Dx;  CAP ; x-ray of chest with possible Lt. 5th rib fracture; hyponatremia; head CT negative.  PMH: includes ETOH abuse; bipolar disorder; CAD; GERD; s/p bil. THA  Clinical Impression  Pt admitted with the above complications. Pt currently with functional limitations due to the deficits listed below (see PT Problem List). Limited assessment due to pt fatigue upon standing with BP of 88/62. Pt reports he feels too weak to ambulate during therapy evaluation. Per OT evaluation pt was mobilizing this AM. Feel that as patient progressess medically he will also progress with his functional mobility and be able to return home at a mod I level. Pt will benefit from skilled PT to increase their independence and safety with mobility to allow discharge to the venue listed below.       Follow Up Recommendations Home health PT    Equipment Recommendations  Rolling walker with 5" wheels    Recommendations for Other Services       Precautions / Restrictions Precautions Precautions: Fall Restrictions Weight Bearing Restrictions: No      Mobility  Bed Mobility Overal bed mobility: Needs Assistance Bed Mobility: Rolling;Sidelying to Sit Rolling: Min assist Sidelying to sit: Min assist       General bed mobility comments: assist to roll fully to Rt and assist to lift shoulders  Transfers Overall transfer level: Needs assistance Equipment used: Rolling walker (2 wheeled) Transfers: Sit to/from Stand Sit to Stand: Supervision Stand pivot transfers: Min guard       General transfer comment: Supervision for safety. Correctly places hands on stable surface to rise. Minor sway upon standing but correct once hands placed on RW for support. Pt able to tolerate standing for 2  minutes however declined to ambulate due to feeling weak. BP 88/62 HR 107 bpm, but denies dizziness. reports feeling "off balance."  Ambulation/Gait                Stairs            Wheelchair Mobility    Modified Rankin (Stroke Patients Only)       Balance Overall balance assessment: Needs assistance Sitting-balance support: No upper extremity supported;Feet supported Sitting balance-Leahy Scale: Good     Standing balance support: No upper extremity supported Standing balance-Leahy Scale: Fair Standing balance comment: Requires unilateral UE support                             Pertinent Vitals/Pain Pain Assessment: No/denies pain Pain Score: 5  Pain Location: Lt chest  Pain Descriptors / Indicators: Aching Pain Intervention(s): Monitored during session   Sitting: BP 89/62, SpO2 100%, HR 96 bpm Standing: BP 88/62, HR 107 bpm    Home Living Family/patient expects to be discharged to:: Private residence Living Arrangements: Alone Available Help at Discharge: Family;Available PRN/intermittently Type of Home: Apartment Home Access: Level entry     Home Layout: One level Home Equipment: Cane - single point;Tub bench      Prior Function Level of Independence: Independent         Comments: Pt reports he goes to gym at least 3 days/wk.  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 [employer]  Hand Dominance   Dominant Hand: Right    Extremity/Trunk Assessment   Upper Extremity Assessment: Defer to OT evaluation           Lower Extremity Assessment: Overall WFL for tasks assessed         Communication   Communication: No difficulties  Cognition Arousal/Alertness: Awake/alert Behavior During Therapy: WFL for tasks assessed/performed Overall Cognitive Status: Within Functional Limits for tasks assessed Area of Impairment: Orientation Orientation Level: Time                  General Comments General comments (skin  integrity, edema, etc.): VSS throughout session     Exercises        Assessment/Plan    PT Assessment Patient needs continued PT services  PT Diagnosis Difficulty walking   PT Problem List Decreased strength;Decreased activity tolerance;Decreased balance;Decreased mobility;Decreased knowledge of use of DME;Cardiopulmonary status limiting activity  PT Treatment Interventions DME instruction;Gait training;Functional mobility training;Therapeutic activities;Therapeutic exercise;Balance training;Neuromuscular re-education;Patient/family education   PT Goals (Current goals can be found in the Care Plan section) Acute Rehab PT Goals Patient Stated Goal: to get stronger and go home  PT Goal Formulation: With patient Time For Goal Achievement: 12/25/13 Potential to Achieve Goals: Good    Frequency Min 3X/week   Barriers to discharge Decreased caregiver support lives alone    Co-evaluation               End of Session Equipment Utilized During Treatment: Gait belt Activity Tolerance: Patient limited by fatigue;Treatment limited secondary to medical complications (Comment) (drop in BP) Patient left: in chair;with call bell/phone within reach Nurse Communication: Mobility status;Other (comment) (low BP)         Time: [id number] PT Time Calculation (min) (ACUTE ONLY): 24 min   Charges:   PT Evaluation $Initial PT Evaluation Tier I: 1 Procedure PT Treatments $Therapeutic Activity: 8-22 mins   PT G Codes:          [place], Shamar Engelmann S 12/11/2013, 2:44 PM  Sunday SpillersLogan [facility] [place], [place] [phone]

## 2013-12-11 NOTE — H&P (Signed)
[facility] History and Physical  [name] OZH:[id number] DOB: [birth year] DOA: 12/10/2013  Referring physician: ED physician PCP: Thayer Headings, MD  Specialists:   Chief Complaint: SOB, fall and right flank pain  HPI: [name] is a 63 y.o. male with past medical history for alcohol abuse, coronary artery disease, hypertension, hyperlipidemia, depression, bipolar, who presents with shortness of breath, right flank pain and fall.  Patient reports that 3 days ago he had fall about 11 PM after he came back from outside and wetted his shoes. He injured his head and left hip, and started having headache and left hip pain. His pain has been persistent. He took Tylenol with temporary relief. He does not have weakness, numbness or tenderness incisions in his extremities. He does not have vision change, hearing loss.  Patient also reports having shortness of breath recently. But no cough, chest pain, fever or chills. Patient does not have nausea, vomiting, diarrhea, abdominal pain. No symptoms of UTI. No leg edema. He reports that he has been drinking alcohol, last drink was 2 weeks ago.  Work up in the ED demonstrates Leukocytosis with WBC 15.9; right upper lobe infiltration on chest x-ray, possible left fifth rib fracture, hyponatremia with sodium 110. CT head in the spine is negative for acute abnormalities. left hip x-ray showed no acute abnormalities. He is admitted to inpatient for further evaluation and treatment.  Review of Systems: As presented in the history of presenting illness, rest negative.  Where does patient live?  Lives at home alone Can patient participate in ADLs? Yes  Allergy:  Allergies  Allergen Reactions  . Ambien [Zolpidem Tartrate] Other (See Comments)    Sleep Walking  . Codeine Itching    Past Medical History  Diagnosis Date  . MVP (mitral valve prolapse)   . Anxiety   . Hypertension   . Hyperlipidemia   . Palpitations   . Bipolar disorder   .  Depression   . Esophagitis   . Gastritis   . Insomnia   . Seizures   . Pneumonia 2005  . Arthritis   . Hypercholesteremia   . Coronary artery disease     a.  s/p PCI to RCA in 2007;  b.  Last LHC 04/2008:  LM with irregularities, LAD 50%, mid LAD 50%, proximal D1 80-90%, proximal OM1 30-40%, proximal OM2 50%, PLB branch 30-40%, site of prior angioplasty in the RCA widely patent, EF 65%=>Med Rx;  c. Nuclear study a 12/2011: EF 71% and normal perfusion.    Marland Kitchen History of echocardiogram     Echo 08/16/09: Mild LVH, EF 55-60%, no normal wall motion, grade 1 diastolic dysfunction    Past Surgical History  Procedure Laterality Date  . Coronary angioplasty  2007    RCA  . Cardiac catheterization  04/2008    EF 65%  . Knee arthroscopy, medial patello femoral ligament repair    . Hip arthroplasty  bilateral   . Shoulder arthroscopy  right  . Tonsillectomy    . Total hip revision  02/07/2012    Procedure: TOTAL HIP REVISION;  Surgeon: [name], MD;  Location: [facility] OR;  Service: Orthopedics;  Laterality: Left;  [place] Hip Revision Set  . Right knee surgery      Social History:  reports that he quit smoking about 3 months ago. His smoking use included Cigarettes. He has a 22.5 pack-year smoking history. He does not have any smokeless tobacco history on file. He reports that he drinks alcohol.  He reports that he does not use illicit drugs.  Family History:  Family History  Problem Relation Age of Onset  . Heart disease Neg Hx   . Prostate cancer Father      Prior to Admission medications   Medication Sig Start Date End Date Taking? Authorizing Provider  aspirin EC 325 MG tablet Take 1 tablet (325 mg total) by mouth every morning. For blood thinner for heart health 08/15/13  Yes Nanine MeansJamison Lord, NP  atorvastatin (LIPITOR) 40 MG tablet Take 1 tablet (40 mg total) by mouth daily. For cholesterol 10/24/12  Yes Sanjuana KavaAgnes I Nwoko, NP  buPROPion (WELLBUTRIN XL) 300 MG 24 hr tablet Take 1 tablet (300 mg  total) by mouth daily. For depression 10/24/12  Yes Sanjuana KavaAgnes I Nwoko, NP  cloNIDine (CATAPRES) 0.1 MG tablet Take 1 tablet (0.1 mg total) by mouth 2 (two) times daily. 08/15/13  Yes Nanine MeansJamison Lord, NP  hydrOXYzine (ATARAX/VISTARIL) 25 MG tablet Take 1 tablet (25 mg total) by mouth every 6 (six) hours as needed for anxiety. 10/24/12  Yes Sanjuana KavaAgnes I Nwoko, NP  metoprolol succinate (TOPROL-XL) 25 MG 24 hr tablet Take 1 tablet (25 mg total) by mouth daily. 08/15/13  Yes Nanine MeansJamison Lord, NP  nitroGLYCERIN (NITROSTAT) 0.4 MG SL tablet Place 1 tablet (0.4 mg total) under the tongue every 5 (five) minutes as needed for chest pain (CP or SOB). 10/24/12  Yes Sanjuana KavaAgnes I Nwoko, NP  pantoprazole (PROTONIX) 40 MG tablet Take 1 tablet (40 mg total) by mouth daily. 08/15/13  Yes Nanine MeansJamison Lord, NP  QUEtiapine (SEROQUEL XR) 50 MG TB24 24 hr tablet Take 5 tablets (250 mg total) by mouth at bedtime. 08/15/13  Yes Nanine MeansJamison Lord, NP    Physical Exam: Filed Vitals:   12/10/13 1730 12/10/13 1745 12/10/13 1916 12/10/13 1930  BP: 106/64 94/63 98/65  106/53  Pulse:  98 102   Temp:      TempSrc:      Resp: 14 15 27 18   Height:      Weight:      SpO2:  90% 100%    General: Not in acute distress HEENT:       Eyes: PERRL, EOMI, no scleral icterus       ENT: No discharge from the ears and nose, no pharynx injection, no tonsillar enlargement.        Neck: No JVD, no bruit, no mass felt. Cardiac: S1/S2, RRR, No murmurs, No gallops or rubs Pulm: Good air movement bilaterally. Clear to auscultation bilaterally. No rales, wheezing, rhonchi or rubs. Abd: Soft, nondistended, nontender, no rebound pain, no organomegaly, BS present Ext: No edema bilaterally. 2+DP/PT pulse bilaterally Musculoskeletal: No joint deformities, erythema, or stiffness, ROM full Skin: No rashes.  Neuro: Alert and oriented X3, cranial nerves II-XII grossly intact, muscle strength 5/5 in all extremeties, sensation to light touch intact. Brachial reflex 2+ bilaterally. Knee  reflex 1+ bilaterally. Negative Babinski's sign. Normal finger to nose test. Psych: Patient is not psychotic, no suicidal or hemocidal ideation.  Labs on Admission:  Basic Metabolic Panel:  Recent Labs Lab 12/10/13 1806  NA 110*  K 3.8  CL 73*  CO2 19  GLUCOSE 100*  BUN 29*  CREATININE 1.14  CALCIUM 9.4   Liver Function Tests:  Recent Labs Lab 12/10/13 1806  AST 57*  ALT 37  ALKPHOS 73  BILITOT 0.8  PROT 7.9  ALBUMIN 3.9   No results for input(s): LIPASE, AMYLASE in the last 168 hours. No results for input(s): AMMONIA  in the last 168 hours. CBC:  Recent Labs Lab 12/10/13 1806  WBC 15.9*  NEUTROABS 12.5*  HGB 12.2*  HCT 35.4*  MCV 77.8*  PLT 218   Cardiac Enzymes:  Recent Labs Lab 12/10/13 1806  TROPONINI <0.30    BNP (last 3 results) No results for input(s): PROBNP in the last 8760 hours. CBG: No results for input(s): GLUCAP in the last 168 hours.  Radiological Exams on Admission: Dg Chest 2 View  12/10/2013   CLINICAL DATA:  Dizziness, fell from standing position 4 days ago, chest pain, LEFT shoulder pain, personal history of coronary artery disease, hypertension  EXAM: CHEST  2 VIEW  COMPARISON:  08/09/2013  FINDINGS: Mild enlargement of cardiac silhouette.  Mildly tortuous aorta.  Mediastinal contours and pulmonary vascularity normal.  Mild infiltrate in RIGHT upper lobe.  Minimal atelectasis or infiltrate in LEFT mid lung.  Remaining lungs clear.  No pleural effusion or pneumothorax.  Bones appear demineralized.  Probable fracture anterior LEFT fifth rib.  Old healed fracture posterior RIGHT eighth and questionably ninth ribs.  IMPRESSION: Mild enlargement of cardiac silhouette.  Mild RIGHT upper lobe infiltrate with mild atelectasis versus infiltrate in LEFT mid lung.  Suspected acute fracture anterior LEFT fifth rib.   Electronically Signed   By: Ulyses Southward M.D.   On: 12/10/2013 18:58   Dg Hip Complete Left  12/10/2013   CLINICAL DATA:  Fall from  standing position with left hip pain, initial encounter  EXAM: LEFT HIP - COMPLETE 2+ VIEW  COMPARISON:  None.  FINDINGS: Bilateral hip prostheses are seen. No will fracture or loosening is identified. No dislocation of the femoral components is seen. No gross soft tissue abnormality is noted.  IMPRESSION: Postsurgical change without acute abnormality.   Electronically Signed   By: Alcide Clever M.D.   On: 12/10/2013 18:58   Ct Head Wo Contrast  12/10/2013   CLINICAL DATA:  Dizziness and mental status changes since a fall on Sunday. Reports he tripped in the bathroom and struck his forehead on the concrete floor. Denies LOC  EXAM: CT HEAD WITHOUT CONTRAST  CT CERVICAL SPINE WITHOUT CONTRAST  TECHNIQUE: Multidetector CT imaging of the head and cervical spine was performed following the standard protocol without intravenous contrast. Multiplanar CT image reconstructions of the cervical spine were also generated.  COMPARISON:  07/31/2011  FINDINGS: CT HEAD FINDINGS  Sinuses/Soft tissues: Right nasal bone fracture and left nasal bone subluxation medially. Unchanged and without overlying soft tissue swelling.  Mucosal thickening of the right maxillary sinus is improved. Partial opacification of the right frontal sinus is chronic. Clear mastoid air cells. No skull fracture.  Intracranial: Age advanced cerebral atrophy. Moderate low density in the periventricular white matter likely related to small vessel disease. Carotid and vertebral basilar atherosclerosis bilaterally.  CT CERVICAL SPINE FINDINGS  Spinal visualization through the bottom of T1. Prevertebral soft tissues are within normal limits. Left greater than right carotid atherosclerosis. Right apical pleural parenchymal scarring. No apical pneumothorax. Multilevel spondylosis. Left greater than right neural foraminal narrowing at C3-4 secondary to uncovertebral joint hypertrophy. Left-sided narrowing at C4-5 with left greater than right narrowing at C5-6. Also  due to uncovertebral joint hypertrophy.  Maintenance of vertebral body height and alignment. Advanced degenerative disc disease at C4 through T1. Extensive facet arthropathy. Coronal reformats demonstrate a normal C1-C2 articulation.  IMPRESSION: 1. No acute intracranial abnormality. Small vessel ischemic change. 2.  No acute fracture or subluxation within the cervical spine. 3. Advanced spondylosis.  Electronically Signed   By: Jeronimo Greaves M.D.   On: 12/10/2013 18:44   Ct Cervical Spine Wo Contrast  12/10/2013   CLINICAL DATA:  Dizziness and mental status changes since a fall on Sunday. Reports he tripped in the bathroom and struck his forehead on the concrete floor. Denies LOC  EXAM: CT HEAD WITHOUT CONTRAST  CT CERVICAL SPINE WITHOUT CONTRAST  TECHNIQUE: Multidetector CT imaging of the head and cervical spine was performed following the standard protocol without intravenous contrast. Multiplanar CT image reconstructions of the cervical spine were also generated.  COMPARISON:  07/31/2011  FINDINGS: CT HEAD FINDINGS  Sinuses/Soft tissues: Right nasal bone fracture and left nasal bone subluxation medially. Unchanged and without overlying soft tissue swelling.  Mucosal thickening of the right maxillary sinus is improved. Partial opacification of the right frontal sinus is chronic. Clear mastoid air cells. No skull fracture.  Intracranial: Age advanced cerebral atrophy. Moderate low density in the periventricular white matter likely related to small vessel disease. Carotid and vertebral basilar atherosclerosis bilaterally.  CT CERVICAL SPINE FINDINGS  Spinal visualization through the bottom of T1. Prevertebral soft tissues are within normal limits. Left greater than right carotid atherosclerosis. Right apical pleural parenchymal scarring. No apical pneumothorax. Multilevel spondylosis. Left greater than right neural foraminal narrowing at C3-4 secondary to uncovertebral joint hypertrophy. Left-sided narrowing at  C4-5 with left greater than right narrowing at C5-6. Also due to uncovertebral joint hypertrophy.  Maintenance of vertebral body height and alignment. Advanced degenerative disc disease at C4 through T1. Extensive facet arthropathy. Coronal reformats demonstrate a normal C1-C2 articulation.  IMPRESSION: 1. No acute intracranial abnormality. Small vessel ischemic change. 2.  No acute fracture or subluxation within the cervical spine. 3. Advanced spondylosis.   Electronically Signed   By: Jeronimo Greaves M.D.   On: 12/10/2013 18:44   Dg Shoulder Left  12/10/2013   CLINICAL DATA:  Fall from standing position with left shoulder pain, initial encounter  EXAM: LEFT SHOULDER - 2+ VIEW  COMPARISON:  None.  FINDINGS: There is no evidence of fracture or dislocation. There is no evidence of arthropathy or other focal bone abnormality. Soft tissues are unremarkable.  IMPRESSION: No acute abnormality noted.   Electronically Signed   By: Alcide Clever M.D.   On: 12/10/2013 18:58   Dg Hand Complete Left  12/10/2013   CLINICAL DATA:  Fall from standing position with left hand pain, initial encounter  EXAM: LEFT HAND - COMPLETE 3+ VIEW  COMPARISON:  None.  FINDINGS: No acute fracture or dislocation is noted. Widening of the distal radius is noted consistent with prior fracture and healing. No gross soft tissue abnormality is seen.  IMPRESSION: No acute abnormality noted.   Electronically Signed   By: Alcide Clever M.D.   On: 12/10/2013 18:57    EKG: Independently reviewed.   Assessment/Plan Principal Problem:   Hyponatremia Active Problems:   CAD (coronary artery disease)   HTN (hypertension)   Polysubstance abuse   Alcohol dependence   Anxiety   Bipolar disorder   Depression   Fall  Hyponatremia: Etiology is not clear. Likely due to alcohol abuse. Seroquel use may have partially contributed. Not very sure how acute his hyponatremia developed. We'll assume chronic (longer than 48 hours). We'll correct this  slowly. Currently his mental status is okay. - will admit to SDU - 3% sodium chloride 50 mL per hour for 6 hours, which will approximately increase sodium of by 3, and then check BMP - Check  urine sodium and urine osm, plasma osm - Check TSH  - hold Seroquel  CAP: Has evidenced on chest x-ray. The patient has shortness of breath, but no fever, chills, chest pain. He has leukocytosis with WBC 15.9. - urine legionella and S. pneumococcal antigen - treat with Rocephin plus Azithro - treat with albuterol prn - follow up blood culture x 2  Alcohl abuse:  -CIWA protocol  Fall: It is likely due to mechanical fall as he described that he wetted his shoes before he had fall.  Alcohol abuse is also possible etiology. CT head is negative for acute abnormalities. He has possible left fifth rib fracture on X-ray -Pain control  Bipolar disorder and depression: Stable. No suicidal or homicidal ideation. -Continue Wellbutrin -Hold Seroquel  Coronary artery disease: No chest pain - observe closely - Continue Lipitor, aspirin  GERD: Continue PPI   DVT ppx: SQ Heparin      Code Status: Full code Family Communication: None at bed side.    Disposition Plan: Admit to inpatient   Date of Service 12/10/2013    Lorretta HarpIU, [name] [facility] Pager [phone]  If 7PM-7AM, please contact night-coverage [email or web address] Password [email or web address] 12/10/2013, 8:16 PM

## 2013-12-11 NOTE — Progress Notes (Signed)
Pt transported to ICU in order to receive hypertonic IV solution. Particular solution was ordered when patient was first admitted to [facility], but admitting physician ordered stat labs to see if sodium would improve without hypertonic IV solution. Little improvement noted - pt transferred for accurate level of care during IV transfusion. Report called to receiving nurse prior to pt's transport. Belongings, chart material, and meds sent with patient. Pt in no s/s of distress. Pt A&Ox3 - confused to time. Nurse explained to pt why he was being transferred.

## 2013-12-11 NOTE — Progress Notes (Signed)
Patient Demographics  [name] NamDavid Brock, is a 63 y.o. male, DOB - 09-15-1950, ZOX:[id number]  Admit date - 12/10/2013   Admitting Physician Lorretta HarpXilin Niu, MD  Outpatient Primary MD for the patient is Thayer HeadingsMACKENZIE,BRIAN, MD  LOS - 1   Chief Complaint  Patient presents with  . Dizziness        Subjective:   [name] today has, No headache, No chest pain, No abdominal pain - No Nausea, No new weakness tingling or numbness, No Cough - SOB.    Assessment & Plan    Hyponatremia: Etiology is not clear. 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 Does take SSRIs which have been held. Was on 3% normal saline which has been stopped, sodium has come up, renal has been consulted. We'll monitor BMP every 4 hours and stepdown.    CAP:   Pending urine legionella and S. pneumococcal antigen, follow blood cultures. Continue Rocephin plus azithromycin empiric. Nebulizer treatments oxygen as needed.    Alcohl abuse: CIWA protocol + librium, last drink over 3 weeks ago per patient, counseled to quit.    Fall: Mechanical fall with left fifth rib fracture. Supportive care    Bipolar disorder and depression: Stable. No suicidal or homicidal ideation. Hold SSRIs due to #1 above.    Coronary artery disease: No chest pain,  observe closely,  Continue Lipitor, aspirin    GERD: Continue PPI     Code Status: Full  Family Communication: none present  Disposition Plan: TBD   Procedures     Consults  Renal   Medications  Scheduled Meds: . aspirin EC  325 mg Oral q morning - 10a  . atorvastatin  40 mg Oral Daily  . azithromycin  500 mg Intravenous Q24H  . cefTRIAXone (ROCEPHIN)  IV  1 g Intravenous Q24H  . chlordiazePOXIDE  5 mg Oral TID  . folic acid  1 mg Oral Daily  . heparin   5,000 Units Subcutaneous 3 times per day  . LORazepam  0-4 mg Intravenous Q6H   Followed by  . [START ON 12/12/2013] LORazepam  0-4 mg Intravenous Q12H  . multivitamin with minerals  1 tablet Oral Daily  . pantoprazole  40 mg Oral Daily  . thiamine  100 mg Oral Daily   Or  . thiamine  100 mg Intravenous Daily   Continuous Infusions:  PRN Meds:.albuterol, HYDROmorphone (DILAUDID) injection, LORazepam **OR** LORazepam, nitroGLYCERIN  DVT Prophylaxis    Heparin    Lab Results  Component Value Date   PLT 175 12/11/2013    Antibiotics     Anti-infectives    Start     Dose/Rate Route Frequency Ordered Stop   12/10/13 2130  cefTRIAXone (ROCEPHIN) 1 g in dextrose 5 % 50 mL IVPB - Premix     1 g100 mL/hr over 30 Minutes Intravenous Every 24 hours 12/10/13 2119 12/17/13 2159   12/10/13 2130  azithromycin (ZITHROMAX) 500 mg in dextrose 5 % 250 mL IVPB     500 mg250 mL/hr over 60 Minutes Intravenous Every 24 hours 12/10/13 2119 12/17/13 2159          Objective:   Filed Vitals:   12/11/13 0500 12/11/13 0600 12/11/13 0700 12/11/13 0748  BP:  Pulse: 110 104 98   Temp:    97.4 F (36.3 C)  TempSrc:    Oral  Resp: 18 14 17    Height:      Weight:      SpO2: 88% 96% 98%     Wt Readings from Last 3 Encounters:  12/11/13 90.5 kg (199 lb 8.3 oz)  08/12/13 90.719 kg (200 lb)  08/09/13 88.451 kg (195 lb)     Intake/Output Summary (Last 24 hours) at 12/11/13 0901 Last data filed at 12/11/13 0700  Gross per 24 hour  Intake 901.67 ml  Output   1035 ml  Net -133.33 ml     Physical Exam  Awake Alert, Oriented X 3, No new F.N deficits, Normal affect [place].AT,PERRAL Supple Neck,No JVD, No cervical lymphadenopathy appriciated.  Symmetrical Chest wall movement, Good air movement bilaterally, CTAB RRR,No Gallops,Rubs or new Murmurs, No Parasternal Heave +ve B.Sounds, Abd Soft, No tenderness, No organomegaly appriciated, No rebound - guarding or rigidity. No Cyanosis, Clubbing  or edema, No new Rash or bruise     Data Review   Micro Results Recent Results (from the past 240 hour(s))  MRSA PCR Screening     Status: None   Collection Time: 12/11/13  3:45 AM  Result Value Ref Range Status   MRSA by PCR NEGATIVE NEGATIVE Final    Comment:        The GeneXpert MRSA Assay (FDA approved for NASAL specimens only), is one component of a comprehensive MRSA colonization surveillance program. It is not intended to diagnose MRSA infection nor to guide or monitor treatment for MRSA infections.     Radiology Reports Dg Chest 2 View  12/10/2013   CLINICAL DATA:  Dizziness, fell from standing position 4 days ago, chest pain, LEFT shoulder pain, personal history of coronary artery disease, hypertension  EXAM: CHEST  2 VIEW  COMPARISON:  08/09/2013  FINDINGS: Mild enlargement of cardiac silhouette.  Mildly tortuous aorta.  Mediastinal contours and pulmonary vascularity normal.  Mild infiltrate in RIGHT upper lobe.  Minimal atelectasis or infiltrate in LEFT mid lung.  Remaining lungs clear.  No pleural effusion or pneumothorax.  Bones appear demineralized.  Probable fracture anterior LEFT fifth rib.  Old healed fracture posterior RIGHT eighth and questionably ninth ribs.  IMPRESSION: Mild enlargement of cardiac silhouette.  Mild RIGHT upper lobe infiltrate with mild atelectasis versus infiltrate in LEFT mid lung.  Suspected acute fracture anterior LEFT fifth rib.   Electronically Signed   By: Ulyses Southward M.D.   On: 12/10/2013 18:58   Dg Hip Complete Left  12/10/2013   CLINICAL DATA:  Fall from standing position with left hip pain, initial encounter  EXAM: LEFT HIP - COMPLETE 2+ VIEW  COMPARISON:  None.  FINDINGS: Bilateral hip prostheses are seen. No will fracture or loosening is identified. No dislocation of the femoral components is seen. No gross soft tissue abnormality is noted.  IMPRESSION: Postsurgical change without acute abnormality.   Electronically Signed   By: Alcide Clever M.D.   On: 12/10/2013 18:58   Ct Head Wo Contrast  12/10/2013   CLINICAL DATA:  Dizziness and mental status changes since a fall on Sunday. Reports he tripped in the bathroom and struck his forehead on the concrete floor. Denies LOC  EXAM: CT HEAD WITHOUT CONTRAST  CT CERVICAL SPINE WITHOUT CONTRAST  TECHNIQUE: Multidetector CT imaging of the head and cervical spine was performed following the standard protocol without intravenous contrast. Multiplanar CT image reconstructions of  the cervical spine were also generated.  COMPARISON:  07/31/2011  FINDINGS: CT HEAD FINDINGS  Sinuses/Soft tissues: Right nasal bone fracture and left nasal bone subluxation medially. Unchanged and without overlying soft tissue swelling.  Mucosal thickening of the right maxillary sinus is improved. Partial opacification of the right frontal sinus is chronic. Clear mastoid air cells. No skull fracture.  Intracranial: Age advanced cerebral atrophy. Moderate low density in the periventricular white matter likely related to small vessel disease. Carotid and vertebral basilar atherosclerosis bilaterally.  CT CERVICAL SPINE FINDINGS  Spinal visualization through the bottom of T1. Prevertebral soft tissues are within normal limits. Left greater than right carotid atherosclerosis. Right apical pleural parenchymal scarring. No apical pneumothorax. Multilevel spondylosis. Left greater than right neural foraminal narrowing at C3-4 secondary to uncovertebral joint hypertrophy. Left-sided narrowing at C4-5 with left greater than right narrowing at C5-6. Also due to uncovertebral joint hypertrophy.  Maintenance of vertebral body height and alignment. Advanced degenerative disc disease at C4 through T1. Extensive facet arthropathy. Coronal reformats demonstrate a normal C1-C2 articulation.  IMPRESSION: 1. No acute intracranial abnormality. Small vessel ischemic change. 2.  No acute fracture or subluxation within the cervical spine. 3. Advanced  spondylosis.   Electronically Signed   By: Jeronimo Greaves M.D.   On: 12/10/2013 18:44   Ct Cervical Spine Wo Contrast  12/10/2013   CLINICAL DATA:  Dizziness and mental status changes since a fall on Sunday. Reports he tripped in the bathroom and struck his forehead on the concrete floor. Denies LOC  EXAM: CT HEAD WITHOUT CONTRAST  CT CERVICAL SPINE WITHOUT CONTRAST  TECHNIQUE: Multidetector CT imaging of the head and cervical spine was performed following the standard protocol without intravenous contrast. Multiplanar CT image reconstructions of the cervical spine were also generated.  COMPARISON:  07/31/2011  FINDINGS: CT HEAD FINDINGS  Sinuses/Soft tissues: Right nasal bone fracture and left nasal bone subluxation medially. Unchanged and without overlying soft tissue swelling.  Mucosal thickening of the right maxillary sinus is improved. Partial opacification of the right frontal sinus is chronic. Clear mastoid air cells. No skull fracture.  Intracranial: Age advanced cerebral atrophy. Moderate low density in the periventricular white matter likely related to small vessel disease. Carotid and vertebral basilar atherosclerosis bilaterally.  CT CERVICAL SPINE FINDINGS  Spinal visualization through the bottom of T1. Prevertebral soft tissues are within normal limits. Left greater than right carotid atherosclerosis. Right apical pleural parenchymal scarring. No apical pneumothorax. Multilevel spondylosis. Left greater than right neural foraminal narrowing at C3-4 secondary to uncovertebral joint hypertrophy. Left-sided narrowing at C4-5 with left greater than right narrowing at C5-6. Also due to uncovertebral joint hypertrophy.  Maintenance of vertebral body height and alignment. Advanced degenerative disc disease at C4 through T1. Extensive facet arthropathy. Coronal reformats demonstrate a normal C1-C2 articulation.  IMPRESSION: 1. No acute intracranial abnormality. Small vessel ischemic change. 2.  No acute  fracture or subluxation within the cervical spine. 3. Advanced spondylosis.   Electronically Signed   By: Jeronimo Greaves M.D.   On: 12/10/2013 18:44   Dg Chest Port 1 View  12/10/2013   CLINICAL DATA:  Status post PICC line placement, shortness of breath  EXAM: PORTABLE CHEST - 1 VIEW  COMPARISON:  12/10/2013  FINDINGS: Cardiac shadow is enlarged but stable. A left-sided PICC line is now noted with the tip at the cavoatrial junction. With patchy infiltrative changes seen on the prior exam are again noted and stable. No new focal abnormality is seen.  IMPRESSION:  Status post PICC line placement in satisfactory position. The remainder of the exam is stable.   Electronically Signed   By: Alcide CleverMark  Lukens M.D.   On: 12/10/2013 23:28   Dg Shoulder Left  12/10/2013   CLINICAL DATA:  Fall from standing position with left shoulder pain, initial encounter  EXAM: LEFT SHOULDER - 2+ VIEW  COMPARISON:  None.  FINDINGS: There is no evidence of fracture or dislocation. There is no evidence of arthropathy or other focal bone abnormality. Soft tissues are unremarkable.  IMPRESSION: No acute abnormality noted.   Electronically Signed   By: Alcide CleverMark  Lukens M.D.   On: 12/10/2013 18:58   Dg Hand Complete Left  12/10/2013   CLINICAL DATA:  Fall from standing position with left hand pain, initial encounter  EXAM: LEFT HAND - COMPLETE 3+ VIEW  COMPARISON:  None.  FINDINGS: No acute fracture or dislocation is noted. Widening of the distal radius is noted consistent with prior fracture and healing. No gross soft tissue abnormality is seen.  IMPRESSION: No acute abnormality noted.   Electronically Signed   By: Alcide CleverMark  Lukens M.D.   On: 12/10/2013 18:57     CBC  Recent Labs Lab 12/10/13 1806 12/11/13 0650  WBC 15.9* 11.4*  HGB 12.2* 9.3*  HCT 35.4* 26.7*  PLT 218 175  MCV 77.8* 77.8*  MCH 26.8 27.1  MCHC 34.5 34.8  RDW 15.6* 15.5  LYMPHSABS 2.2 2.0  MONOABS 1.2* 1.3*  EOSABS 0.0 0.2  BASOSABS 0.0 0.0    Chemistries    Recent Labs Lab 12/10/13 1806 12/11/13 0008 12/11/13 0650  NA 110* 111* 122*  K 3.8 3.6* 3.4*  CL 73* 77* 85*  CO2 19 20 23   GLUCOSE 100* 93 97  BUN 29* 29* 27*  CREATININE 1.14 1.05 1.01  CALCIUM 9.4 8.6 8.2*  AST 57*  --  37  ALT 37  --  27  ALKPHOS 73  --  54  BILITOT 0.8  --  0.5   ------------------------------------------------------------------------------------------------------------------ estimated creatinine clearance is 85.8 mL/min (by C-G formula based on Cr of 1.01). ------------------------------------------------------------------------------------------------------------------ No results for input(s): HGBA1C in the last 72 hours. ------------------------------------------------------------------------------------------------------------------ No results for input(s): CHOL, HDL, LDLCALC, TRIG, CHOLHDL, LDLDIRECT in the last 72 hours. ------------------------------------------------------------------------------------------------------------------  Recent Labs  12/11/13 0008  TSH 1.410   ------------------------------------------------------------------------------------------------------------------ No results for input(s): VITAMINB12, FOLATE, FERRITIN, TIBC, IRON, RETICCTPCT in the last 72 hours.  Coagulation profile  Recent Labs Lab 12/10/13 1806 12/11/13 0008  INR 0.89 0.97    No results for input(s): DDIMER in the last 72 hours.  Cardiac Enzymes  Recent Labs Lab 12/10/13 1806  TROPONINI <0.30   ------------------------------------------------------------------------------------------------------------------ Invalid input(s): POCBNP     Time Spent in minutes  35   Jaimy Kliethermes K M.D on 12/11/2013 at 9:01 AM  Between 7am to 7pm - Pager - [phone]  After 7pm go to [email or web address] - password [email or web address]  And look for the night coverage person covering for me after hours  [facility] Office  [phone]

## 2013-12-11 NOTE — Progress Notes (Signed)
Pt arrive to unit in no s/s of distress. Pt A&Ox3 - confused to time. Pt oriented to unit and room. Whiteboard updated. Callbell within reach. Will continue to monitor. Tele applied. VS stable. Charge nurse received report from ED prior to pt's arrival.

## 2013-12-11 NOTE — Consult Note (Signed)
[name] is a 63 y.o. male with past medical history for alcohol abuse, coronary artery disease, hypertension, hyperlipidemia, depression, bipolar, who presents with shortness of breath, right flank pain and fall. Patient reported that 3 days PTA he had fall about 11 PM after he came back from outside with wet shoes, slipping on bathroom ceramic tile.  He presented to the ED c/o SOB. Work up demonstrated Leukocytosis with WBC 15.9; right upper lobe infiltration on chest x-ray, possible left fifth rib fracture, hyponatremia with sodium 110. CT head in the spine was negative for acute abnormalities.    In the hospital he was treated with hypertonic saline(3%) and NS infusions. Sodium rose from 110 to 122 within a day.  He consumes one gallon of tea daily and has done this for 10 years.  There is a prior history of hyponatremia to 121 in August 2014 and sodium of around 132 repeatedly in 2014.  There were no diuretics PTA. He was taking bupropion and Seroquel.  He reports no heavy alcohol consumption recently with last drink 2 weeks ago.  He c/o nocturia x 2. No alcohol in blood. No illicit substances.   Past Medical History  Diagnosis Date  . MVP (mitral valve prolapse)   . Anxiety   . Hypertension   . Hyperlipidemia   . Palpitations   . Bipolar disorder   . Depression   . Esophagitis   . Gastritis   . Insomnia   . Hypercholesteremia   . Coronary artery disease     a.  s/p PCI to RCA in 2007;  b.  Last LHC 04/2008:  LM with irregularities, LAD 50%, mid LAD 50%, proximal D1 80-90%, proximal OM1 30-40%, proximal OM2 50%, PLB branch 30-40%, site of prior angioplasty in the RCA widely patent, EF 65%=>Med Rx;  c. Nuclear study a 12/2011: EF 71% and normal perfusion.    Marland Kitchen History of echocardiogram     Echo 08/16/09: Mild LVH, EF 55-60%, no normal wall motion, grade 1 diastolic dysfunction  . Anginal pain   . Pneumonia 2005  . GERD (gastroesophageal reflux disease)   . Arthritis     "hands;  especially in cold weather" (12/11/2013)  . 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 Coronary angioplasty  2007    RCA  . Cardiac catheterization  04/2008    EF 65%  . Knee arthroscopy, medial patello femoral ligament repair    . Hip arthroplasty Bilateral     "both replaced twice"   . Total shoulder replacement Right   . Total hip revision  02/07/2012    Procedure: TOTAL HIP REVISION;  Surgeon: Kerin Salen, MD;  Location: [facility];  Service: Orthopedics;  Laterality: Left;  [place] Hip Revision Set  . Total knee arthroplasty Right   . Appendectomy  1958  . Tonsillectomy  1958  . Joint replacement     Social History:  reports that he quit smoking about 3 months ago. His smoking use included Cigarettes. He has a 45 pack-year smoking history. He has never used smokeless tobacco. He reports that he drinks alcohol. He reports that he uses illicit drugs (Benzodiazepines) about 7 times per week. Allergies:  Allergies  Allergen Reactions  . Ambien [Zolpidem Tartrate] Other (See Comments)    Sleep Walking  . Codeine Itching   Family History  Problem Relation Age of Onset  . Heart disease Neg Hx   .  Prostate cancer Father     Medications:  Scheduled: . aspirin EC  325 mg Oral q morning - 10a  . atorvastatin  40 mg Oral Daily  . azithromycin  500 mg Intravenous Q24H  . cefTRIAXone (ROCEPHIN)  IV  1 g Intravenous Q24H  . chlordiazePOXIDE  5 mg Oral TID  . folic acid  1 mg Oral Daily  . heparin  5,000 Units Subcutaneous 3 times per day  . LORazepam  0-4 mg Intravenous Q6H   Followed by  . [START ON 12/12/2013] LORazepam  0-4 mg Intravenous Q12H  . multivitamin with minerals  1 tablet Oral Daily  . pantoprazole  40 mg Oral Daily  . thiamine  100 mg Oral Daily   Or  . thiamine  100 mg Intravenous Daily    ROS: as per HPI otherwise not contributory   Blood pressure 90/50, pulse 90, temperature 97.6 F (36.4 C),  temperature source Oral, resp. rate 17, height 5' 10" (1.778 m), weight 90.5 kg (199 lb 8.3 oz), SpO2 97 %.  General appearance: alert and cooperative Head: Normocephalic, without obvious abnormality, atraumatic Ears: normal TM's and external ear canals both ears Nose: Nares normal. Septum midline. Mucosa normal. No drainage or sinus tenderness. Throat: lips, mucosa, and tongue normal; teeth and gums normal Resp: rhonchi bilaterally Chest wall: no tenderness Cardio: regular rate and rhythm, S1, S2 normal, no murmur, click, rub or gallop GI: soft, non-tender; bowel sounds normal; no masses,  no organomegaly Extremities: extremities normal, atraumatic, no cyanosis or edema, trace edema on left Skin: Skin color, texture, turgor normal. No rashes or lesions Neurologic: Grossly normal Results for orders placed or performed during the hospital encounter of 12/10/13 (from the past 48 hour(s))  CBC with Differential     Status: Abnormal   Collection Time: 12/10/13  6:06 PM  Result Value Ref Range   WBC 15.9 (H) 4.0 - 10.5 K/uL   RBC 4.55 4.22 - 5.81 MIL/uL   Hemoglobin 12.2 (L) 13.0 - 17.0 g/dL   HCT 35.4 (L) 39.0 - 52.0 %   MCV 77.8 (L) 78.0 - 100.0 fL   MCH 26.8 26.0 - 34.0 pg   MCHC 34.5 30.0 - 36.0 g/dL   RDW 15.6 (H) 11.5 - 15.5 %   Platelets 218 150 - 400 K/uL   Neutrophils Relative % 78 (H) 43 - 77 %   Neutro Abs 12.5 (H) 1.7 - 7.7 K/uL   Lymphocytes Relative 14 12 - 46 %   Lymphs Abs 2.2 0.7 - 4.0 K/uL   Monocytes Relative 8 3 - 12 %   Monocytes Absolute 1.2 (H) 0.1 - 1.0 K/uL   Eosinophils Relative 0 0 - 5 %   Eosinophils Absolute 0.0 0.0 - 0.7 K/uL   Basophils Relative 0 0 - 1 %   Basophils Absolute 0.0 0.0 - 0.1 K/uL  Comprehensive metabolic panel     Status: Abnormal   Collection Time: 12/10/13  6:06 PM  Result Value Ref Range   Sodium 110 (LL) 137 - 147 mEq/L    Comment: CRITICAL RESULT CALLED TO, READ BACK BY AND VERIFIED WITH: Barbaraann Rondo [id number] M  SHIPMAN    Potassium 3.8 3.7 - 5.3 mEq/L   Chloride 73 (L) 96 - 112 mEq/L   CO2 19 19 - 32 mEq/L   Glucose, Bld 100 (H) 70 - 99 mg/dL   BUN 29 (H) 6 - 23 mg/dL   Creatinine, Ser 1.14 0.50 - 1.35 mg/dL  Calcium 9.4 8.4 - 10.5 mg/dL   Total Protein 7.9 6.0 - 8.3 g/dL   Albumin 3.9 3.5 - 5.2 g/dL   AST 57 (H) 0 - 37 U/L   ALT 37 0 - 53 U/L   Alkaline Phosphatase 73 39 - 117 U/L   Total Bilirubin 0.8 0.3 - 1.2 mg/dL   GFR calc non Af Amer 67 (L) >90 mL/min   GFR calc Af Amer 78 (L) >90 mL/min    Comment: (NOTE) The eGFR has been calculated using the CKD EPI equation. This calculation has not been validated in all clinical situations. eGFR's persistently <90 mL/min signify possible Chronic Kidney Disease.    Anion gap 18 (H) 5 - 15  Troponin I     Status: None   Collection Time: 12/10/13  6:06 PM  Result Value Ref Range   Troponin I <0.30 <0.30 ng/mL    Comment:        Due to the release kinetics of cTnI, a negative result within the first hours of the onset of symptoms does not rule out myocardial infarction with certainty. If myocardial infarction is still suspected, repeat the test at appropriate intervals.   Protime-INR     Status: None   Collection Time: 12/10/13  6:06 PM  Result Value Ref Range   Prothrombin Time 12.2 11.6 - 15.2 seconds   INR 0.89 0.00 - 1.49  Ethanol     Status: None   Collection Time: 12/10/13  6:06 PM  Result Value Ref Range   Alcohol, Ethyl (B) <11 0 - 11 mg/dL    Comment:        LOWEST DETECTABLE LIMIT FOR SERUM ALCOHOL IS 11 mg/dL FOR MEDICAL PURPOSES ONLY   Urinalysis, Routine w reflex microscopic     Status: Abnormal   Collection Time: 12/10/13  9:02 PM  Result Value Ref Range   Color, Urine AMBER (A) YELLOW    Comment: BIOCHEMICALS MAY BE AFFECTED BY COLOR   APPearance CLEAR CLEAR   Specific Gravity, Urine 1.018 1.005 - 1.030   pH 5.5 5.0 - 8.0   Glucose, UA NEGATIVE NEGATIVE mg/dL   Hgb urine dipstick NEGATIVE NEGATIVE    Bilirubin Urine NEGATIVE NEGATIVE   Ketones, ur 15 (A) NEGATIVE mg/dL   Protein, ur NEGATIVE NEGATIVE mg/dL   Urobilinogen, UA 0.2 0.0 - 1.0 mg/dL   Nitrite NEGATIVE NEGATIVE   Leukocytes, UA NEGATIVE NEGATIVE    Comment: MICROSCOPIC NOT DONE ON URINES WITH NEGATIVE PROTEIN, BLOOD, LEUKOCYTES, NITRITE, OR GLUCOSE <1000 mg/dL.  Drug screen panel, emergency     Status: Abnormal   Collection Time: 12/10/13  9:02 PM  Result Value Ref Range   Opiates NONE DETECTED NONE DETECTED   Cocaine NONE DETECTED NONE DETECTED   Benzodiazepines POSITIVE (A) NONE DETECTED   Amphetamines NONE DETECTED NONE DETECTED   Tetrahydrocannabinol NONE DETECTED NONE DETECTED   Barbiturates NONE DETECTED NONE DETECTED    Comment:        DRUG SCREEN FOR MEDICAL PURPOSES ONLY.  IF CONFIRMATION IS NEEDED FOR ANY PURPOSE, NOTIFY LAB WITHIN 5 DAYS.        LOWEST DETECTABLE LIMITS FOR URINE DRUG SCREEN Drug Class       Cutoff (ng/mL) Amphetamine      1000 Barbiturate      200 Benzodiazepine   759 Tricyclics       163 Opiates          300 Cocaine  300 THC              50   Osmolality, urine     Status: None   Collection Time: 12/10/13  9:02 PM  Result Value Ref Range   Osmolality, Ur 514 390 - 1090 mOsm/kg    Comment: Performed at [facility]  Sodium, urine, random     Status: None   Collection Time: 12/10/13  9:02 PM  Result Value Ref Range   Sodium, Ur <20 mEq/L  Legionella antigen, urine     Status: None   Collection Time: 12/10/13 10:25 PM  Result Value Ref Range   Specimen Description URINE, CATHETERIZED    Special Requests NONE    Legionella Antigen, Urine      Negative for Legionella pneumophila serogroup 1                                                              Legionella pneumophila serogroup 1 antigen can be detected in urine within 2 to 3 days of infection and may persist even after treatment. This  assay does not detect other Legionella species or serogroups. Performed  at [facility]    Report Status 12/11/2013 FINAL   Strep pneumoniae urinary antigen     Status: None   Collection Time: 12/10/13 10:26 PM  Result Value Ref Range   Strep Pneumo Urinary Antigen NEGATIVE NEGATIVE    Comment:        Infection due to S. pneumoniae cannot be absolutely ruled out since the antigen present may be below the detection limit of the test.   Osmolality     Status: Abnormal   Collection Time: 12/11/13 12:08 AM  Result Value Ref Range   Osmolality 244 (L) 275 - 300 mOsm/kg    Comment: Result repeated and verified. CRITICAL RESULT CALLED TO, READ BACK BY AND VERIFIED WITH: JOHNSON,T RN @ [phone] 12/11/13 LEONARD,A   TSH     Status: None   Collection Time: 12/11/13 12:08 AM  Result Value Ref Range   TSH 1.410 0.350 - 4.500 uIU/mL  Protime-INR     Status: None   Collection Time: 12/11/13 12:08 AM  Result Value Ref Range   Prothrombin Time 12.9 11.6 - 15.2 seconds   INR 0.97 0.00 - 4.96  Basic metabolic panel     Status: Abnormal   Collection Time: 12/11/13 12:08 AM  Result Value Ref Range   Sodium 111 (LL) 137 - 147 mEq/L    Comment: CRITICAL RESULT CALLED TO, READ BACK BY AND VERIFIED WITH: J.WOOSLEY RN [id number] 12/11/13 E.GADDY    Potassium 3.6 (L) 3.7 - 5.3 mEq/L   Chloride 77 (L) 96 - 112 mEq/L   CO2 20 19 - 32 mEq/L   Glucose, Bld 93 70 - 99 mg/dL   BUN 29 (H) 6 - 23 mg/dL   Creatinine, Ser 1.05 0.50 - 1.35 mg/dL   Calcium 8.6 8.4 - 10.5 mg/dL   GFR calc non Af Amer 74 (L) >90 mL/min   GFR calc Af Amer 86 (L) >90 mL/min    Comment: (NOTE) The eGFR has been calculated using the CKD EPI equation. This calculation has not been validated in all clinical situations. eGFR's persistently <90 mL/min signify possible Chronic Kidney Disease.  Anion gap 14 5 - 15  Glucose, capillary     Status: Abnormal   Collection Time: 12/11/13  3:37 AM  Result Value Ref Range   Glucose-Capillary 175 (H) 70 - 99 mg/dL   Comment 1 Documented in Chart     Comment 2 Notify RN   MRSA PCR Screening     Status: None   Collection Time: 12/11/13  3:45 AM  Result Value Ref Range   MRSA by PCR NEGATIVE NEGATIVE    Comment:        The GeneXpert MRSA Assay (FDA approved for NASAL specimens only), is one component of a comprehensive MRSA colonization surveillance program. It is not intended to diagnose MRSA infection nor to guide or monitor treatment for MRSA infections.   Comprehensive metabolic panel     Status: Abnormal   Collection Time: 12/11/13  6:50 AM  Result Value Ref Range   Sodium 122 (L) 137 - 147 mEq/L   Potassium 3.4 (L) 3.7 - 5.3 mEq/L   Chloride 85 (L) 96 - 112 mEq/L   CO2 23 19 - 32 mEq/L   Glucose, Bld 97 70 - 99 mg/dL   BUN 27 (H) 6 - 23 mg/dL   Creatinine, Ser 1.01 0.50 - 1.35 mg/dL   Calcium 8.2 (L) 8.4 - 10.5 mg/dL   Total Protein 5.9 (L) 6.0 - 8.3 g/dL   Albumin 3.0 (L) 3.5 - 5.2 g/dL   AST 37 0 - 37 U/L   ALT 27 0 - 53 U/L   Alkaline Phosphatase 54 39 - 117 U/L   Total Bilirubin 0.5 0.3 - 1.2 mg/dL   GFR calc non Af Amer 78 (L) >90 mL/min   GFR calc Af Amer >90 >90 mL/min    Comment: (NOTE) The eGFR has been calculated using the CKD EPI equation. This calculation has not been validated in all clinical situations. eGFR's persistently <90 mL/min signify possible Chronic Kidney Disease.    Anion gap 14 5 - 15  CBC WITH DIFFERENTIAL     Status: Abnormal   Collection Time: 12/11/13  6:50 AM  Result Value Ref Range   WBC 11.4 (H) 4.0 - 10.5 K/uL   RBC 3.43 (L) 4.22 - 5.81 MIL/uL   Hemoglobin 9.3 (L) 13.0 - 17.0 g/dL    Comment: REPEATED TO VERIFY   HCT 26.7 (L) 39.0 - 52.0 %   MCV 77.8 (L) 78.0 - 100.0 fL   MCH 27.1 26.0 - 34.0 pg   MCHC 34.8 30.0 - 36.0 g/dL   RDW 15.5 11.5 - 15.5 %   Platelets 175 150 - 400 K/uL   Neutrophils Relative % 70 43 - 77 %   Neutro Abs 7.9 (H) 1.7 - 7.7 K/uL   Lymphocytes Relative 18 12 - 46 %   Lymphs Abs 2.0 0.7 - 4.0 K/uL   Monocytes Relative 11 3 - 12 %   Monocytes  Absolute 1.3 (H) 0.1 - 1.0 K/uL   Eosinophils Relative 1 0 - 5 %   Eosinophils Absolute 0.2 0.0 - 0.7 K/uL   Basophils Relative 0 0 - 1 %   Basophils Absolute 0.0 0.0 - 0.1 K/uL  Basic metabolic panel     Status: Abnormal   Collection Time: 12/11/13  8:15 AM  Result Value Ref Range   Sodium 121 (LL) 137 - 147 mEq/L    Comment: CRITICAL RESULT CALLED TO, READ BACK BY AND VERIFIED WITH: SOREHA C,RN AT 0932 12/11/13 BY ZBEECH.    Potassium  3.5 (L) 3.7 - 5.3 mEq/L   Chloride 85 (L) 96 - 112 mEq/L   CO2 20 19 - 32 mEq/L   Glucose, Bld 96 70 - 99 mg/dL   BUN 25 (H) 6 - 23 mg/dL   Creatinine, Ser 0.95 0.50 - 1.35 mg/dL   Calcium 8.2 (L) 8.4 - 10.5 mg/dL   GFR calc non Af Amer 87 (L) >90 mL/min   GFR calc Af Amer >90 >90 mL/min    Comment: (NOTE) The eGFR has been calculated using the CKD EPI equation. This calculation has not been validated in all clinical situations. eGFR's persistently <90 mL/min signify possible Chronic Kidney Disease.    Anion gap 16 (H) 5 - 15  Basic metabolic panel     Status: Abnormal   Collection Time: 12/11/13 11:30 AM  Result Value Ref Range   Sodium 121 (LL) 137 - 147 mEq/L    Comment: CRITICAL RESULT CALLED TO, READ BACK BY AND VERIFIED WITH: Georg Ruddle AT [id number] 12/11/13 BY ZBEECH.    Potassium 3.6 (L) 3.7 - 5.3 mEq/L   Chloride 85 (L) 96 - 112 mEq/L   CO2 23 19 - 32 mEq/L   Glucose, Bld 107 (H) 70 - 99 mg/dL   BUN 23 6 - 23 mg/dL   Creatinine, Ser 0.95 0.50 - 1.35 mg/dL   Calcium 8.3 (L) 8.4 - 10.5 mg/dL   GFR calc non Af Amer 87 (L) >90 mL/min   GFR calc Af Amer >90 >90 mL/min    Comment: (NOTE) The eGFR has been calculated using the CKD EPI equation. This calculation has not been validated in all clinical situations. eGFR's persistently <90 mL/min signify possible Chronic Kidney Disease.    Anion gap 13 5 - 15   Dg Chest 2 View  12/10/2013   CLINICAL DATA:  Dizziness, fell from standing position 4 days ago, chest pain, LEFT  shoulder pain, personal history of coronary artery disease, hypertension  EXAM: CHEST  2 VIEW  COMPARISON:  08/09/2013  FINDINGS: Mild enlargement of cardiac silhouette.  Mildly tortuous aorta.  Mediastinal contours and pulmonary vascularity normal.  Mild infiltrate in RIGHT upper lobe.  Minimal atelectasis or infiltrate in LEFT mid lung.  Remaining lungs clear.  No pleural effusion or pneumothorax.  Bones appear demineralized.  Probable fracture anterior LEFT fifth rib.  Old healed fracture posterior RIGHT eighth and questionably ninth ribs.  IMPRESSION: Mild enlargement of cardiac silhouette.  Mild RIGHT upper lobe infiltrate with mild atelectasis versus infiltrate in LEFT mid lung.  Suspected acute fracture anterior LEFT fifth rib.   Electronically Signed   By: Lavonia Dana M.D.   On: 12/10/2013 18:58   Dg Hip Complete Left  12/10/2013   CLINICAL DATA:  Fall from standing position with left hip pain, initial encounter  EXAM: LEFT HIP - COMPLETE 2+ VIEW  COMPARISON:  None.  FINDINGS: Bilateral hip prostheses are seen. No will fracture or loosening is identified. No dislocation of the femoral components is seen. No gross soft tissue abnormality is noted.  IMPRESSION: Postsurgical change without acute abnormality.   Electronically Signed   By: Inez Catalina M.D.   On: 12/10/2013 18:58   Ct Head Wo Contrast  12/10/2013   CLINICAL DATA:  Dizziness and mental status changes since a fall on Sunday. Reports he tripped in the bathroom and struck his forehead on the concrete floor. Denies LOC  EXAM: CT HEAD WITHOUT CONTRAST  CT CERVICAL SPINE WITHOUT CONTRAST  TECHNIQUE: Multidetector CT imaging  of the head and cervical spine was performed following the standard protocol without intravenous contrast. Multiplanar CT image reconstructions of the cervical spine were also generated.  COMPARISON:  07/31/2011  FINDINGS: CT HEAD FINDINGS  Sinuses/Soft tissues: Right nasal bone fracture and left nasal bone subluxation  medially. Unchanged and without overlying soft tissue swelling.  Mucosal thickening of the right maxillary sinus is improved. Partial opacification of the right frontal sinus is chronic. Clear mastoid air cells. No skull fracture.  Intracranial: Age advanced cerebral atrophy. Moderate low density in the periventricular white matter likely related to small vessel disease. Carotid and vertebral basilar atherosclerosis bilaterally.  CT CERVICAL SPINE FINDINGS  Spinal visualization through the bottom of T1. Prevertebral soft tissues are within normal limits. Left greater than right carotid atherosclerosis. Right apical pleural parenchymal scarring. No apical pneumothorax. Multilevel spondylosis. Left greater than right neural foraminal narrowing at C3-4 secondary to uncovertebral joint hypertrophy. Left-sided narrowing at C4-5 with left greater than right narrowing at C5-6. Also due to uncovertebral joint hypertrophy.  Maintenance of vertebral body height and alignment. Advanced degenerative disc disease at C4 through T1. Extensive facet arthropathy. Coronal reformats demonstrate a normal C1-C2 articulation.  IMPRESSION: 1. No acute intracranial abnormality. Small vessel ischemic change. 2.  No acute fracture or subluxation within the cervical spine. 3. Advanced spondylosis.   Electronically Signed   By: Abigail Miyamoto M.D.   On: 12/10/2013 18:44   Ct Cervical Spine Wo Contrast  12/10/2013   CLINICAL DATA:  Dizziness and mental status changes since a fall on Sunday. Reports he tripped in the bathroom and struck his forehead on the concrete floor. Denies LOC  EXAM: CT HEAD WITHOUT CONTRAST  CT CERVICAL SPINE WITHOUT CONTRAST  TECHNIQUE: Multidetector CT imaging of the head and cervical spine was performed following the standard protocol without intravenous contrast. Multiplanar CT image reconstructions of the cervical spine were also generated.  COMPARISON:  07/31/2011  FINDINGS: CT HEAD FINDINGS  Sinuses/Soft tissues:  Right nasal bone fracture and left nasal bone subluxation medially. Unchanged and without overlying soft tissue swelling.  Mucosal thickening of the right maxillary sinus is improved. Partial opacification of the right frontal sinus is chronic. Clear mastoid air cells. No skull fracture.  Intracranial: Age advanced cerebral atrophy. Moderate low density in the periventricular white matter likely related to small vessel disease. Carotid and vertebral basilar atherosclerosis bilaterally.  CT CERVICAL SPINE FINDINGS  Spinal visualization through the bottom of T1. Prevertebral soft tissues are within normal limits. Left greater than right carotid atherosclerosis. Right apical pleural parenchymal scarring. No apical pneumothorax. Multilevel spondylosis. Left greater than right neural foraminal narrowing at C3-4 secondary to uncovertebral joint hypertrophy. Left-sided narrowing at C4-5 with left greater than right narrowing at C5-6. Also due to uncovertebral joint hypertrophy.  Maintenance of vertebral body height and alignment. Advanced degenerative disc disease at C4 through T1. Extensive facet arthropathy. Coronal reformats demonstrate a normal C1-C2 articulation.  IMPRESSION: 1. No acute intracranial abnormality. Small vessel ischemic change. 2.  No acute fracture or subluxation within the cervical spine. 3. Advanced spondylosis.   Electronically Signed   By: Abigail Miyamoto M.D.   On: 12/10/2013 18:44   Dg Chest Port 1 View  12/10/2013   CLINICAL DATA:  Status post PICC line placement, shortness of breath  EXAM: PORTABLE CHEST - 1 VIEW  COMPARISON:  12/10/2013  FINDINGS: Cardiac shadow is enlarged but stable. A left-sided PICC line is now noted with the tip at the cavoatrial junction. With patchy  infiltrative changes seen on the prior exam are again noted and stable. No new focal abnormality is seen.  IMPRESSION: Status post PICC line placement in satisfactory position. The remainder of the exam is stable.    Electronically Signed   By: Inez Catalina M.D.   On: 12/10/2013 23:28   Dg Shoulder Left  12/10/2013   CLINICAL DATA:  Fall from standing position with left shoulder pain, initial encounter  EXAM: LEFT SHOULDER - 2+ VIEW  COMPARISON:  None.  FINDINGS: There is no evidence of fracture or dislocation. There is no evidence of arthropathy or other focal bone abnormality. Soft tissues are unremarkable.  IMPRESSION: No acute abnormality noted.   Electronically Signed   By: Inez Catalina M.D.   On: 12/10/2013 18:58   Dg Hand Complete Left  12/10/2013   CLINICAL DATA:  Fall from standing position with left hand pain, initial encounter  EXAM: LEFT HAND - COMPLETE 3+ VIEW  COMPARISON:  None.  FINDINGS: No acute fracture or dislocation is noted. Widening of the distal radius is noted consistent with prior fracture and healing. No gross soft tissue abnormality is seen.  IMPRESSION: No acute abnormality noted.   Electronically Signed   By: Inez Catalina M.D.   On: 12/10/2013 18:57    Assessment:  1 Acute and chronic hyponatremia, probably due to excess free water intake---asymptomatic Plan: 1 Limit fluids (At bedside: no cups & no pitchers; limit ice tea at home)  A) 800cc fluid restriction per day 2) Check AM cortisol  , C 12/11/2013, 3:08 PM

## 2013-12-11 NOTE — Progress Notes (Signed)
CRITICAL VALUE ALERT  Critical value received:  Sodium 121     Date of notification:  11/19    Time of notification:  0900   Critical value read back:Yes.    Nurse who received alert:  Marigene Ehlersyril Forcha RN   MD notified (1st page):  Yes   Time of first page: 1000  MD notified (2nd page):1001  Time of second page:  Responding MD:  Thedore MinsSingh  Time MD responded:  1001

## 2013-12-12 LAB — RESPIRATORY VIRUS PANEL
ADENOVIRUS: NOT DETECTED
INFLUENZA A H3: NOT DETECTED
INFLUENZA B 1: NOT DETECTED
Influenza A H1: NOT DETECTED
Influenza A: NOT DETECTED
Metapneumovirus: NOT DETECTED
PARAINFLUENZA 3 A: NOT DETECTED
Parainfluenza 1: NOT DETECTED
Parainfluenza 2: NOT DETECTED
RHINOVIRUS: NOT DETECTED
Respiratory Syncytial Virus A: NOT DETECTED
Respiratory Syncytial Virus B: NOT DETECTED

## 2013-12-12 LAB — BASIC METABOLIC PANEL
ANION GAP: 15 (ref 5–15)
ANION GAP: 14 (ref 5–15)
BUN: 18 mg/dL (ref 6–23)
BUN: 16 mg/dL (ref 6–23)
CALCIUM: 8.7 mg/dL (ref 8.4–10.5)
CALCIUM: 8.5 mg/dL (ref 8.4–10.5)
CO2: 21 mEq/L (ref 19–32)
CO2: 22 mEq/L (ref 19–32)
CREATININE: 0.97 mg/dL (ref 0.50–1.35)
CREATININE: 0.92 mg/dL (ref 0.50–1.35)
Chloride: 88 mEq/L — ABNORMAL LOW (ref 96–112)
Chloride: 89 mEq/L — ABNORMAL LOW (ref 96–112)
GFR calc Af Amer: >90 mL/min (ref >90)
GFR, EST NON AFRICAN AMERICAN: 87 mL/min — AB (ref >90)
GFR, EST NON AFRICAN AMERICAN: 89 mL/min — AB (ref >90)
Glucose, Bld: 113 mg/dL — ABNORMAL HIGH (ref 70–99)
Glucose, Bld: 88 mg/dL (ref 70–99)
Potassium: 3.7 mEq/L (ref 3.7–5.3)
Potassium: 3.8 mEq/L (ref 3.7–5.3)
Sodium: 124 mEq/L — ABNORMAL LOW (ref 137–147)
Sodium: 125 mEq/L — ABNORMAL LOW (ref 137–147)

## 2013-12-12 LAB — URINE CULTURE
COLONY COUNT: NO GROWTH
Culture: NO GROWTH

## 2013-12-12 LAB — CORTISOL: Cortisol, Plasma: 21.3 ug/dL

## 2013-12-12 MED ORDER — HYDROCODONE-ACETAMINOPHEN 5-325 MG PO TABS
1.0000 | ORAL_TABLET | ORAL | Status: DC | PRN
Start: 2013-12-12 — End: 2013-12-13

## 2013-12-12 MED ORDER — AZITHROMYCIN 500 MG PO TABS
500.0000 mg | ORAL_TABLET | Freq: Every day | ORAL | Status: DC
  Administered 2013-12-12: 500 mg via ORAL
  Filled 2013-12-12 (×2): qty 1

## 2013-12-12 NOTE — Progress Notes (Signed)
Patient ID: [name] BanDavid M [name], male   DOB: 1950-02-22, 63 y.o.   MRN: [id number]  [facility] Progress Note    Assessment/ Plan:   1. 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 Improved status post 3% saline and normal saline infusions-current sodium level stable with fluid restriction only. Reported that he also had volume depletion on admission necessitating normal saline infusion. His management will essentially comprise of fluid restriction and possibly the addition of low-dose loop diuretic (furosemide 10 mg daily) to avoid decompensation of stable hyponatremia. 2. Hypertension: Blood pressures appear to be well controlled off any antihypertensive therapy-continue to follow. 3. History of alcohol abuse: The patient reports that he has been abstinent from alcohol for the past 2 weeks or so, precautions taken for alcohol withdrawal. 4. Community-acquired pneumonia: Started on empiric therapy with ceftriaxone and azithromycin, afebrile and reportedly feeling better-continue to monitor.  Subjective:   Comfortably resting in bed, states that he feels "great"    Objective:   BP 107/67 mmHg  Pulse 92  Temp(Src) 98 F (36.7 C) (Oral)  Resp 22  Ht 5\' 10"  (1.778 m)  Wt 90.5 kg (199 lb 8.3 oz)  BMI 28.63 kg/m2  SpO2 95%  Intake/Output Summary (Last 24 hours) at 12/12/13 0912 Last data filed at 12/12/13 0600  Gross per 24 hour  Intake    720 ml  Output   1825 ml  Net  -1105 ml   Weight change:   Physical Exam: Gen: Comfortably resting in bed, eating breakfast CVS: Pulse regular in rate and rhythm, S1 and S2 normal Resp: Fine rales right base otherwise clear-no wheeze Abd: Soft, obese, nontender Ext: No lower extremity edema  Imaging: Dg Chest 2 View  12/10/2013   CLINICAL DATA:  Dizziness, fell from standing position 4 days ago, chest pain, LEFT shoulder pain, personal history of  coronary artery disease, hypertension  EXAM: CHEST  2 VIEW  COMPARISON:  08/09/2013  FINDINGS: Mild enlargement of cardiac silhouette.  Mildly tortuous aorta.  Mediastinal contours and pulmonary vascularity normal.  Mild infiltrate in RIGHT upper lobe.  Minimal atelectasis or infiltrate in LEFT mid lung.  Remaining lungs clear.  No pleural effusion or pneumothorax.  Bones appear demineralized.  Probable fracture anterior LEFT fifth rib.  Old healed fracture posterior RIGHT eighth and questionably ninth ribs.  IMPRESSION: Mild enlargement of cardiac silhouette.  Mild RIGHT upper lobe infiltrate with mild atelectasis versus infiltrate in LEFT mid lung.  Suspected acute fracture anterior LEFT fifth rib.   Electronically Signed   By: Ulyses SouthwardMark  Boles M.D.   On: 12/10/2013 18:58   Dg Hip Complete Left  12/10/2013   CLINICAL DATA:  Fall from standing position with left hip pain, initial encounter  EXAM: LEFT HIP - COMPLETE 2+ VIEW  COMPARISON:  None.  FINDINGS: Bilateral hip prostheses are seen. No will fracture or loosening is identified. No dislocation of the femoral components is seen. No gross soft tissue abnormality is noted.  IMPRESSION: Postsurgical change without acute abnormality.   Electronically Signed   By: Alcide CleverMark  Lukens M.D.   On: 12/10/2013 18:58   Ct Head Wo Contrast  12/10/2013   CLINICAL DATA:  Dizziness and mental status changes since a fall on Sunday. Reports he tripped in the bathroom and struck his forehead on the concrete floor. Denies LOC  EXAM: CT HEAD WITHOUT CONTRAST  CT CERVICAL SPINE WITHOUT CONTRAST  TECHNIQUE: Multidetector  CT imaging of the head and cervical spine was performed following the standard protocol without intravenous contrast. Multiplanar CT image reconstructions of the cervical spine were also generated.  COMPARISON:  07/31/2011  FINDINGS: CT HEAD FINDINGS  Sinuses/Soft tissues: Right nasal bone fracture and left nasal bone subluxation medially. Unchanged and without overlying  soft tissue swelling.  Mucosal thickening of the right maxillary sinus is improved. Partial opacification of the right frontal sinus is chronic. Clear mastoid air cells. No skull fracture.  Intracranial: Age advanced cerebral atrophy. Moderate low density in the periventricular white matter likely related to small vessel disease. Carotid and vertebral basilar atherosclerosis bilaterally.  CT CERVICAL SPINE FINDINGS  Spinal visualization through the bottom of T1. Prevertebral soft tissues are within normal limits. Left greater than right carotid atherosclerosis. Right apical pleural parenchymal scarring. No apical pneumothorax. Multilevel spondylosis. Left greater than right neural foraminal narrowing at C3-4 secondary to uncovertebral joint hypertrophy. Left-sided narrowing at C4-5 with left greater than right narrowing at C5-6. Also due to uncovertebral joint hypertrophy.  Maintenance of vertebral body height and alignment. Advanced degenerative disc disease at C4 through T1. Extensive facet arthropathy. Coronal reformats demonstrate a normal C1-C2 articulation.  IMPRESSION: 1. No acute intracranial abnormality. Small vessel ischemic change. 2.  No acute fracture or subluxation within the cervical spine. 3. Advanced spondylosis.   Electronically Signed   By: Jeronimo GreavesKyle  Talbot M.D.   On: 12/10/2013 18:44   Ct Cervical Spine Wo Contrast  12/10/2013   CLINICAL DATA:  Dizziness and mental status changes since a fall on Sunday. Reports he tripped in the bathroom and struck his forehead on the concrete floor. Denies LOC  EXAM: CT HEAD WITHOUT CONTRAST  CT CERVICAL SPINE WITHOUT CONTRAST  TECHNIQUE: Multidetector CT imaging of the head and cervical spine was performed following the standard protocol without intravenous contrast. Multiplanar CT image reconstructions of the cervical spine were also generated.  COMPARISON:  07/31/2011  FINDINGS: CT HEAD FINDINGS  Sinuses/Soft tissues: Right nasal bone fracture and left nasal  bone subluxation medially. Unchanged and without overlying soft tissue swelling.  Mucosal thickening of the right maxillary sinus is improved. Partial opacification of the right frontal sinus is chronic. Clear mastoid air cells. No skull fracture.  Intracranial: Age advanced cerebral atrophy. Moderate low density in the periventricular white matter likely related to small vessel disease. Carotid and vertebral basilar atherosclerosis bilaterally.  CT CERVICAL SPINE FINDINGS  Spinal visualization through the bottom of T1. Prevertebral soft tissues are within normal limits. Left greater than right carotid atherosclerosis. Right apical pleural parenchymal scarring. No apical pneumothorax. Multilevel spondylosis. Left greater than right neural foraminal narrowing at C3-4 secondary to uncovertebral joint hypertrophy. Left-sided narrowing at C4-5 with left greater than right narrowing at C5-6. Also due to uncovertebral joint hypertrophy.  Maintenance of vertebral body height and alignment. Advanced degenerative disc disease at C4 through T1. Extensive facet arthropathy. Coronal reformats demonstrate a normal C1-C2 articulation.  IMPRESSION: 1. No acute intracranial abnormality. Small vessel ischemic change. 2.  No acute fracture or subluxation within the cervical spine. 3. Advanced spondylosis.   Electronically Signed   By: Jeronimo GreavesKyle  Talbot M.D.   On: 12/10/2013 18:44   Dg Chest Port 1 View  12/10/2013   CLINICAL DATA:  Status post PICC line placement, shortness of breath  EXAM: PORTABLE CHEST - 1 VIEW  COMPARISON:  12/10/2013  FINDINGS: Cardiac shadow is enlarged but stable. A left-sided PICC line is now noted with the tip at the cavoatrial junction.  With patchy infiltrative changes seen on the prior exam are again noted and stable. No new focal abnormality is seen.  IMPRESSION: Status post PICC line placement in satisfactory position. The remainder of the exam is stable.   Electronically Signed   By: Alcide Clever M.D.    On: 12/10/2013 23:28   Dg Shoulder Left  12/10/2013   CLINICAL DATA:  Fall from standing position with left shoulder pain, initial encounter  EXAM: LEFT SHOULDER - 2+ VIEW  COMPARISON:  None.  FINDINGS: There is no evidence of fracture or dislocation. There is no evidence of arthropathy or other focal bone abnormality. Soft tissues are unremarkable.  IMPRESSION: No acute abnormality noted.   Electronically Signed   By: Alcide Clever M.D.   On: 12/10/2013 18:58   Dg Hand Complete Left  12/10/2013   CLINICAL DATA:  Fall from standing position with left hand pain, initial encounter  EXAM: LEFT HAND - COMPLETE 3+ VIEW  COMPARISON:  None.  FINDINGS: No acute fracture or dislocation is noted. Widening of the distal radius is noted consistent with prior fracture and healing. No gross soft tissue abnormality is seen.  IMPRESSION: No acute abnormality noted.   Electronically Signed   By: Alcide Clever M.D.   On: 12/10/2013 18:57    Labs: BMET  Recent Labs Lab 12/11/13 0650 12/11/13 0815 12/11/13 1130 12/11/13 1530 12/11/13 1940 12/11/13 2331 12/12/13 0328  NA 122* 121* 121* 121* 122* 125* 124*  K 3.4* 3.5* 3.6* 4.0 3.9 3.8 3.7  CL 85* 85* 85* 85* 87* 88* 89*  CO2 23 20 23 22 22 22 21   GLUCOSE 97 96 107* 91 101* 113* 88  BUN 27* 25* 23 22 20 18 16   CREATININE 1.01 0.95 0.95 1.04 0.96 0.97 0.92  CALCIUM 8.2* 8.2* 8.3* 8.5 8.5 8.7 8.5   CBC  Recent Labs Lab 12/10/13 1806 12/11/13 0650  WBC 15.9* 11.4*  NEUTROABS 12.5* 7.9*  HGB 12.2* 9.3*  HCT 35.4* 26.7*  MCV 77.8* 77.8*  PLT 218 175   Medications:    . aspirin EC  325 mg Oral q morning - 10a  . atorvastatin  40 mg Oral Daily  . azithromycin  500 mg Intravenous Q24H  . cefTRIAXone (ROCEPHIN)  IV  1 g Intravenous Q24H  . chlordiazePOXIDE  5 mg Oral TID  . folic acid  1 mg Oral Daily  . heparin  5,000 Units Subcutaneous 3 times per day  . LORazepam  0-4 mg Intravenous Q6H   Followed by  . LORazepam  0-4 mg Intravenous Q12H   . multivitamin with minerals  1 tablet Oral Daily  . pantoprazole  40 mg Oral Daily  . thiamine  100 mg Oral Daily   Or  . thiamine  100 mg Intravenous Daily   Zetta Bills, MD 12/12/2013, 9:12 AM

## 2013-12-12 NOTE — Plan of Care (Signed)
Problem: Phase I Progression Outcomes Goal: Pain controlled with appropriate interventions Outcome: Completed/Met Date Met:  12/12/13 Goal: Other Phase I Outcomes/Goals Outcome: Not Applicable Date Met:  27/51/70  Problem: Phase II Progression Outcomes Goal: Obtain order to discontinue catheter if appropriate Outcome: Not Applicable Date Met:  01/74/94 Goal: Other Phase II Outcomes/Goals Outcome: Not Applicable Date Met:  49/67/59

## 2013-12-12 NOTE — Progress Notes (Signed)
Patient Demographics  [name], is a 63 y.o. male, DOB - April 27, 1950, ONG:[id number]  Admit date - 12/10/2013   Admitting Physician Lorretta Harp, MD  Outpatient Primary MD for the patient is Thayer Headings, MD  LOS - 2   Chief Complaint  Patient presents with  . Dizziness        Subjective:   [name] today has, No headache, No chest pain, No abdominal pain - No Nausea, No new weakness tingling or numbness, No Cough - SOB.    Assessment & Plan    Hyponatremia: Etiology is not clear. 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 Does take SSRIs which have been held. Was on 3% normal saline which has been stopped, sodium has come up, and on strict fluid restriction along with low-dose Lasix per renal, being followed by nephrology. Continue monitoring BMP.    CAP:   Negative urine legionella and S. pneumococcal antigen, follow blood cultures. Continue Rocephin plus azithromycin empiric. Nebulizer treatments oxygen as needed.    Alcohl abuse: CIWA protocol + librium, last drink over 3 weeks ago per patient (inconsistent history), counseled to quit.    Fall: Mechanical fall with left fifth rib fracture. Supportive care    Bipolar disorder and depression: Stable. No suicidal or homicidal ideation. Hold SSRIs due to #1 above.    Coronary artery disease: No chest pain,  observe closely,  Continue Lipitor, aspirin    GERD: Continue PPI     Code Status: Full  Family Communication: none present  Disposition Plan: Floor bed transfer order placed, likely home in the morning   Procedures     Consults  Renal   Medications  Scheduled Meds: . aspirin EC  325 mg Oral q morning - 10a  . atorvastatin  40 mg Oral Daily  . azithromycin  500 mg Intravenous Q24H    . cefTRIAXone (ROCEPHIN)  IV  1 g Intravenous Q24H  . chlordiazePOXIDE  5 mg Oral TID  . folic acid  1 mg Oral Daily  . heparin  5,000 Units Subcutaneous 3 times per day  . LORazepam  0-4 mg Intravenous Q6H   Followed by  . LORazepam  0-4 mg Intravenous Q12H  . multivitamin with minerals  1 tablet Oral Daily  . pantoprazole  40 mg Oral Daily  . thiamine  100 mg Oral Daily   Or  . thiamine  100 mg Intravenous Daily   Continuous Infusions:  PRN Meds:.albuterol, HYDROcodone-acetaminophen, LORazepam **OR** LORazepam, nitroGLYCERIN  DVT Prophylaxis    Heparin    Lab Results  Component Value Date   PLT 175 12/11/2013    Antibiotics     Anti-infectives    Start     Dose/Rate Route Frequency Ordered Stop   12/10/13 2130  cefTRIAXone (ROCEPHIN) 1 g in dextrose 5 % 50 mL IVPB - Premix     1 g100 mL/hr over 30 Minutes Intravenous Every 24 hours 12/10/13 2119 12/17/13 2159   12/10/13 2130  azithromycin (ZITHROMAX) 500 mg in dextrose 5 % 250 mL IVPB     500 mg250 mL/hr over 60 Minutes Intravenous Every 24 hours 12/10/13 2119 12/17/13 2159          Objective:   Ceasar Mons  Vitals:   12/11/13 2357 12/12/13 0400 12/12/13 0744 12/12/13 0800  BP:  107/67  113/69  Pulse:  92  90  Temp: 98.2 F (36.8 C) 98.1 F (36.7 C) 98 F (36.7 C)   TempSrc: Oral Oral Oral   Resp:    19  Height:      Weight:      SpO2:  95%  100%    Wt Readings from Last 3 Encounters:  12/11/13 90.5 kg (199 lb 8.3 oz)  08/12/13 90.719 kg (200 lb)  08/09/13 88.451 kg (195 lb)     Intake/Output Summary (Last 24 hours) at 12/12/13 0950 Last data filed at 12/12/13 0600  Gross per 24 hour  Intake    720 ml  Output   1825 ml  Net  -1105 ml     Physical Exam  Awake Alert, Oriented X 3, No new F.N deficits, Normal affect [name].AT,PERRAL Supple Neck,No JVD, No cervical lymphadenopathy appriciated.  Symmetrical Chest wall movement, Good air movement bilaterally, CTAB RRR,No Gallops,Rubs or new Murmurs, No  Parasternal Heave +ve B.Sounds, Abd Soft, No tenderness, No organomegaly appriciated, No rebound - guarding or rigidity. No Cyanosis, Clubbing or edema, No new Rash or bruise     Data Review   Micro Results Recent Results (from the past 240 hour(s))  Urine culture     Status: None   Collection Time: 12/10/13  9:02 PM  Result Value Ref Range Status   Specimen Description URINE, RANDOM  Final   Special Requests NONE  Final   Culture  Setup Time   Final    12/10/2013 21:51 Performed at [facility]    Colony Count NO GROWTH Performed at [facility]   Final   Culture NO GROWTH Performed at [facility]   Final   Report Status 12/12/2013 FINAL  Final  Culture, blood (routine x 2)     Status: None (Preliminary result)   Collection Time: 12/11/13 12:08 AM  Result Value Ref Range Status   Specimen Description BLOOD LEFT FOOT  Final   Special Requests BOTTLES DRAWN AEROBIC ONLY 10CC  Final   Culture  Setup Time   Final    12/11/2013 04:44 Performed at [facility]    Culture   Final           BLOOD CULTURE RECEIVED NO GROWTH TO DATE CULTURE WILL BE HELD FOR 5 DAYS BEFORE ISSUING A FINAL NEGATIVE REPORT Performed at [facility]    Report Status PENDING  Incomplete  Culture, blood (routine x 2)     Status: None (Preliminary result)   Collection Time: 12/11/13  2:00 AM  Result Value Ref Range Status   Specimen Description BLOOD RIGHT FOOT  Final   Special Requests BOTTLES DRAWN AEROBIC AND ANAEROBIC 10CC EACH  Final   Culture  Setup Time   Final    12/11/2013 12:41 Performed at [facility]    Culture   Final           BLOOD CULTURE RECEIVED NO GROWTH TO DATE CULTURE WILL BE HELD FOR 5 DAYS BEFORE ISSUING A FINAL NEGATIVE REPORT Performed at [facility]    Report Status PENDING  Incomplete  MRSA PCR Screening     Status: None   Collection Time: 12/11/13  3:45 AM  Result Value Ref Range Status   MRSA by PCR NEGATIVE  NEGATIVE Final    Comment:        The GeneXpert MRSA  Assay (FDA approved for NASAL specimens only), is one component of a comprehensive MRSA colonization surveillance program. It is not intended to diagnose MRSA infection nor to guide or monitor treatment for MRSA infections.     Radiology Reports Dg Chest 2 View  12/10/2013   CLINICAL DATA:  Dizziness, fell from standing position 4 days ago, chest pain, LEFT shoulder pain, personal history of coronary artery disease, hypertension  EXAM: CHEST  2 VIEW  COMPARISON:  08/09/2013  FINDINGS: Mild enlargement of cardiac silhouette.  Mildly tortuous aorta.  Mediastinal contours and pulmonary vascularity normal.  Mild infiltrate in RIGHT upper lobe.  Minimal atelectasis or infiltrate in LEFT mid lung.  Remaining lungs clear.  No pleural effusion or pneumothorax.  Bones appear demineralized.  Probable fracture anterior LEFT fifth rib.  Old healed fracture posterior RIGHT eighth and questionably ninth ribs.  IMPRESSION: Mild enlargement of cardiac silhouette.  Mild RIGHT upper lobe infiltrate with mild atelectasis versus infiltrate in LEFT mid lung.  Suspected acute fracture anterior LEFT fifth rib.   Electronically Signed   By: Ulyses SouthwardMark  Boles M.D.   On: 12/10/2013 18:58   Dg Hip Complete Left  12/10/2013   CLINICAL DATA:  Fall from standing position with left hip pain, initial encounter  EXAM: LEFT HIP - COMPLETE 2+ VIEW  COMPARISON:  None.  FINDINGS: Bilateral hip prostheses are seen. No will fracture or loosening is identified. No dislocation of the femoral components is seen. No gross soft tissue abnormality is noted.  IMPRESSION: Postsurgical change without acute abnormality.   Electronically Signed   By: Alcide CleverMark  Lukens M.D.   On: 12/10/2013 18:58   Ct Head Wo Contrast  12/10/2013   CLINICAL DATA:  Dizziness and mental status changes since a fall on Sunday. Reports he tripped in the bathroom and struck his forehead on the concrete floor. Denies LOC   EXAM: CT HEAD WITHOUT CONTRAST  CT CERVICAL SPINE WITHOUT CONTRAST  TECHNIQUE: Multidetector CT imaging of the head and cervical spine was performed following the standard protocol without intravenous contrast. Multiplanar CT image reconstructions of the cervical spine were also generated.  COMPARISON:  07/31/2011  FINDINGS: CT HEAD FINDINGS  Sinuses/Soft tissues: Right nasal bone fracture and left nasal bone subluxation medially. Unchanged and without overlying soft tissue swelling.  Mucosal thickening of the right maxillary sinus is improved. Partial opacification of the right frontal sinus is chronic. Clear mastoid air cells. No skull fracture.  Intracranial: Age advanced cerebral atrophy. Moderate low density in the periventricular white matter likely related to small vessel disease. Carotid and vertebral basilar atherosclerosis bilaterally.  CT CERVICAL SPINE FINDINGS  Spinal visualization through the bottom of T1. Prevertebral soft tissues are within normal limits. Left greater than right carotid atherosclerosis. Right apical pleural parenchymal scarring. No apical pneumothorax. Multilevel spondylosis. Left greater than right neural foraminal narrowing at C3-4 secondary to uncovertebral joint hypertrophy. Left-sided narrowing at C4-5 with left greater than right narrowing at C5-6. Also due to uncovertebral joint hypertrophy.  Maintenance of vertebral body height and alignment. Advanced degenerative disc disease at C4 through T1. Extensive facet arthropathy. Coronal reformats demonstrate a normal C1-C2 articulation.  IMPRESSION: 1. No acute intracranial abnormality. Small vessel ischemic change. 2.  No acute fracture or subluxation within the cervical spine. 3. Advanced spondylosis.   Electronically Signed   By: Jeronimo GreavesKyle  Talbot M.D.   On: 12/10/2013 18:44   Ct Cervical Spine Wo Contrast  12/10/2013   CLINICAL DATA:  Dizziness and mental status changes since a  fall on Sunday. Reports he tripped in the bathroom  and struck his forehead on the concrete floor. Denies LOC  EXAM: CT HEAD WITHOUT CONTRAST  CT CERVICAL SPINE WITHOUT CONTRAST  TECHNIQUE: Multidetector CT imaging of the head and cervical spine was performed following the standard protocol without intravenous contrast. Multiplanar CT image reconstructions of the cervical spine were also generated.  COMPARISON:  07/31/2011  FINDINGS: CT HEAD FINDINGS  Sinuses/Soft tissues: Right nasal bone fracture and left nasal bone subluxation medially. Unchanged and without overlying soft tissue swelling.  Mucosal thickening of the right maxillary sinus is improved. Partial opacification of the right frontal sinus is chronic. Clear mastoid air cells. No skull fracture.  Intracranial: Age advanced cerebral atrophy. Moderate low density in the periventricular white matter likely related to small vessel disease. Carotid and vertebral basilar atherosclerosis bilaterally.  CT CERVICAL SPINE FINDINGS  Spinal visualization through the bottom of T1. Prevertebral soft tissues are within normal limits. Left greater than right carotid atherosclerosis. Right apical pleural parenchymal scarring. No apical pneumothorax. Multilevel spondylosis. Left greater than right neural foraminal narrowing at C3-4 secondary to uncovertebral joint hypertrophy. Left-sided narrowing at C4-5 with left greater than right narrowing at C5-6. Also due to uncovertebral joint hypertrophy.  Maintenance of vertebral body height and alignment. Advanced degenerative disc disease at C4 through T1. Extensive facet arthropathy. Coronal reformats demonstrate a normal C1-C2 articulation.  IMPRESSION: 1. No acute intracranial abnormality. Small vessel ischemic change. 2.  No acute fracture or subluxation within the cervical spine. 3. Advanced spondylosis.   Electronically Signed   By: Jeronimo Greaves M.D.   On: 12/10/2013 18:44   Dg Chest Port 1 View  12/10/2013   CLINICAL DATA:  Status post PICC line placement, shortness of  breath  EXAM: PORTABLE CHEST - 1 VIEW  COMPARISON:  12/10/2013  FINDINGS: Cardiac shadow is enlarged but stable. A left-sided PICC line is now noted with the tip at the cavoatrial junction. With patchy infiltrative changes seen on the prior exam are again noted and stable. No new focal abnormality is seen.  IMPRESSION: Status post PICC line placement in satisfactory position. The remainder of the exam is stable.   Electronically Signed   By: Alcide Clever M.D.   On: 12/10/2013 23:28   Dg Shoulder Left  12/10/2013   CLINICAL DATA:  Fall from standing position with left shoulder pain, initial encounter  EXAM: LEFT SHOULDER - 2+ VIEW  COMPARISON:  None.  FINDINGS: There is no evidence of fracture or dislocation. There is no evidence of arthropathy or other focal bone abnormality. Soft tissues are unremarkable.  IMPRESSION: No acute abnormality noted.   Electronically Signed   By: Alcide Clever M.D.   On: 12/10/2013 18:58   Dg Hand Complete Left  12/10/2013   CLINICAL DATA:  Fall from standing position with left hand pain, initial encounter  EXAM: LEFT HAND - COMPLETE 3+ VIEW  COMPARISON:  None.  FINDINGS: No acute fracture or dislocation is noted. Widening of the distal radius is noted consistent with prior fracture and healing. No gross soft tissue abnormality is seen.  IMPRESSION: No acute abnormality noted.   Electronically Signed   By: Alcide Clever M.D.   On: 12/10/2013 18:57     CBC  Recent Labs Lab 12/10/13 1806 12/11/13 0650  WBC 15.9* 11.4*  HGB 12.2* 9.3*  HCT 35.4* 26.7*  PLT 218 175  MCV 77.8* 77.8*  MCH 26.8 27.1  MCHC 34.5 34.8  RDW 15.6* 15.5  LYMPHSABS 2.2 2.0  MONOABS 1.2* 1.3*  EOSABS 0.0 0.2  BASOSABS 0.0 0.0    Chemistries   Recent Labs Lab 12/10/13 1806  12/11/13 0650  12/11/13 1130 12/11/13 1530 12/11/13 1940 12/11/13 2331 12/12/13 0328  NA 110*  < > 122*  < > 121* 121* 122* 125* 124*  K 3.8  < > 3.4*  < > 3.6* 4.0 3.9 3.8 3.7  CL 73*  < > 85*  < > 85* 85*  87* 88* 89*  CO2 19  < > 23  < > 23 22 22 22 21   GLUCOSE 100*  < > 97  < > 107* 91 101* 113* 88  BUN 29*  < > 27*  < > 23 22 20 18 16   CREATININE 1.14  < > 1.01  < > 0.95 1.04 0.96 0.97 0.92  CALCIUM 9.4  < > 8.2*  < > 8.3* 8.5 8.5 8.7 8.5  AST 57*  --  37  --   --   --   --   --   --   ALT 37  --  27  --   --   --   --   --   --   ALKPHOS 73  --  54  --   --   --   --   --   --   BILITOT 0.8  --  0.5  --   --   --   --   --   --   < > = values in this interval not displayed. ------------------------------------------------------------------------------------------------------------------ estimated creatinine clearance is 94.2 mL/min (by C-G formula based on Cr of 0.92). ------------------------------------------------------------------------------------------------------------------ No results for input(s): HGBA1C in the last 72 hours. ------------------------------------------------------------------------------------------------------------------ No results for input(s): CHOL, HDL, LDLCALC, TRIG, CHOLHDL, LDLDIRECT in the last 72 hours. ------------------------------------------------------------------------------------------------------------------  Recent Labs  12/11/13 0008  TSH 1.410   ------------------------------------------------------------------------------------------------------------------ No results for input(s): VITAMINB12, FOLATE, FERRITIN, TIBC, IRON, RETICCTPCT in the last 72 hours.  Coagulation profile  Recent Labs Lab 12/10/13 1806 12/11/13 0008  INR 0.89 0.97    No results for input(s): DDIMER in the last 72 hours.  Cardiac Enzymes  Recent Labs Lab 12/10/13 1806  TROPONINI <0.30   ------------------------------------------------------------------------------------------------------------------ Invalid input(s): POCBNP     Time Spent in minutes  35   SINGH,PRASHANT K M.D on 12/12/2013 at 9:50 AM  Between 7am to 7pm - Pager -  [phone]  After 7pm go to [email or web address] - password [email or web address]  And look for the night coverage person covering for me after hours  [facility] Office  [phone]

## 2013-12-12 NOTE — Progress Notes (Addendum)
Patient trasfered from 8M to [phone] via wheelchair; alert and oriented x 4; no complaints of pain; PICC line in LFA NSL; skin - ecchymosis on right forehead; scars on left lower leg and left elbow.  Orient patient to room and unit; instructed how to use the call bell and  fall risk precautions. Will continue to monitor the patient.

## 2013-12-12 NOTE — Plan of Care (Signed)
Problem: Phase II Progression Outcomes Goal: IV changed to normal saline lock Outcome: Completed/Met Date Met:  12/12/13  Problem: Phase III Progression Outcomes Goal: Voiding independently Outcome: Completed/Met Date Met:  12/12/13 Goal: Foley discontinued Outcome: Not Applicable Date Met:  76/73/41 Goal: Other Phase III Outcomes/Goals Outcome: Not Applicable Date Met:  93/79/02

## 2013-12-12 NOTE — Progress Notes (Signed)
This patient is receiving the antibiotic(s) Azithromycin by the intravenous route. Based on criteria approved by the Pharmacy and Therapeutics Committee, and the Infectious Disease Division, the antibiotic(s) is / are being converted to equivalent oral dose form(s). These criteria include:  1) Patient being treated for a respiratory tract infection, urinary tract infection, cellulitis, or Clostridium Difficile Associated Diarrhea 2) The patient is not neutropenic and does not exhibit a GI malabsorption state 3) The patient is eating (either orally or per tube) and/or has been taking other orally administered medications for at least 24 hours. 4) The patient is improving clinically (physician assessment and a 24-hour Tmax of 100.5 F).  If you have questions about this conversion, please contact the pharmacy department.  Thank you.  Michel Hendon, PharmD.  Clinical Pharmacist Pager [phone]   

## 2013-12-12 NOTE — Progress Notes (Signed)
Occupational Therapy Treatment Patient Details Name: [name] MRN: [id number] DOB: [birth year] Today's Date: 12/12/2013    History of present illness This 63 y.o. male admitted with SOB, Rt flank pain, and fall ~ 3 days PTA.  Dx;  CAP ; x-ray of chest with possible Lt. 5th rib fracture; hyponatremia; head CT negative.  PMH: includes ETOH abuse; bipolar disorder; CAD; GERD; s/p bil. THA   OT comments  Pt slowly progressing but still continues to have balance deficits when he is up on his feet during adls but was not willing to do more adls on his feet today.  Do feel he is a fall risk at this point and would benefit from S at d/c.  Follow Up Recommendations  No OT follow up;Supervision/Assistance - 24 hour    Equipment Recommendations  None recommended by OT    Recommendations for Other Services      Precautions / Restrictions Precautions Precautions: Fall Restrictions Weight Bearing Restrictions: No       Mobility Bed Mobility Overal bed mobility: Modified Independent Bed Mobility: Supine to Sit     Supine to sit: Modified independent (Device/Increase time)     General bed mobility comments: extra time given.  no rails and no assist needed.  Transfers Overall transfer level: Needs assistance Equipment used: Rolling walker (2 wheeled) Transfers: Sit to/from Stand Sit to Stand: Supervision         General transfer comment: Supvision for safety.  Pt declined walking more in room stating he was too tired but could use more time up on his feet to address balance defiicits.    Balance Overall balance assessment: Needs assistance Sitting-balance support: Feet supported Sitting balance-Leahy Scale: Good     Standing balance support: Bilateral upper extremity supported;During functional activity Standing balance-Leahy Scale: Fair Standing balance comment: Pt unable to stand and toilet without holding to grab bar.  Still appears to have balance defiicits which makes  him a fall risk at home.                    ADL Overall ADL's : Needs assistance/impaired     Grooming: Wash/dry hands;Wash/dry face;Oral care;Min guard;Standing Grooming Details (indicate cue type and reason): Pt continues to remain unsteady on his feet in standing.               Lower Body Dressing: Min guard;Sit to/from stand Lower Body Dressing Details (indicate cue type and reason): Pt requires guarding when standing due to decreased balance. pt is able to donn and doff socks today which he could not do on eval. Toilet Transfer: Ambulation;Minimal assistance Toilet Transfer Details (indicate cue type and reason): Pt with one LOB walking to toilet.  Pt had to hold to grab bar while standing to toilet.  Feel pt may have lost balance w/o holding to grab bar.  pt with 2 LOB during session but pt not aware he is a fall risk.  Feel he has decreased insight to his deficits.         Functional mobility during ADLs: Min guard General ADL Comments: Pt able to access feet today with encouragment.  Main deficit today was balance when on his feet.      Vision                     Perception     Praxis      Cognition   Behavior During Therapy: [facility] for tasks assessed/performed Overall Cognitive Status:  Impaired/Different from baseline Area of Impairment: Problem solving;Orientation Orientation Level: Time (still needs to refer to calendar for month.)            Problem Solving: Slow processing General Comments: Pt groggy upon being awoken and impulsive with most mobility.  Got up very quickly to go to sink and groom and lost balance walking to sink requiring min assist from therapist to recover.      Extremity/Trunk Assessment               Exercises     Shoulder Instructions       General Comments      Pertinent Vitals/ Pain       Pain Assessment: No/denies pain  Home Living                                          Prior  Functioning/Environment              Frequency Min 2X/week     Progress Toward Goals  OT Goals(current goals can now be found in the care plan section)  Progress towards OT goals: Progressing toward goals  Acute Rehab OT Goals Patient Stated Goal: to get stronger and go home  OT Goal Formulation: With patient Time For Goal Achievement: 12/25/13 Potential to Achieve Goals: Good ADL Goals Pt Will Perform Grooming: with modified independence;standing Pt Will Perform Upper Body Bathing: with modified independence;sitting Pt Will Perform Lower Body Bathing: with modified independence;sit to/from stand Pt Will Perform Upper Body Dressing: with modified independence;sitting Pt Will Perform Lower Body Dressing: with modified independence;sit to/from stand Pt Will Transfer to Toilet: with modified independence;ambulating;regular height toilet;grab bars Pt Will Perform Toileting - Clothing Manipulation and hygiene: with modified independence;sit to/from stand Pt Will Perform Tub/Shower Transfer: Tub transfer;with supervision;ambulating  Plan Discharge plan remains appropriate    Co-evaluation                 End of Session Equipment Utilized During Treatment: Gait belt   Activity Tolerance Patient tolerated treatment well   Patient Left in bed;with call bell/phone within reach   Nurse Communication Mobility status        Time: 1205-1221 OT Time Calculation (min): 16 min  Charges: OT General Charges $OT Visit: 1 Procedure OT Treatments $Self Care/Home Management : 8-22 mins  Hope BuddsJones, Bina Veenstra Anne 12/12/2013, 12:38 PM  [phone]

## 2013-12-12 NOTE — Plan of Care (Signed)
Problem: Phase I Progression Outcomes Goal: OOB as tolerated unless otherwise ordered Outcome: Progressing Goal: Voiding-avoid urinary catheter unless indicated Outcome: Completed/Met Date Met:  12/12/13

## 2013-12-13 LAB — BASIC METABOLIC PANEL
Anion gap: 15 (ref 5–15)
BUN: 15 mg/dL (ref 6–23)
CO2: 21 mEq/L (ref 19–32)
CREATININE: 0.89 mg/dL (ref 0.50–1.35)
Calcium: 8.6 mg/dL (ref 8.4–10.5)
Chloride: 92 mEq/L — ABNORMAL LOW (ref 96–112)
GFR, EST NON AFRICAN AMERICAN: 90 mL/min — AB (ref >90)
GLUCOSE: 90 mg/dL (ref 70–99)
Potassium: 3.9 mEq/L (ref 3.7–5.3)
Sodium: 128 mEq/L — ABNORMAL LOW (ref 137–147)

## 2013-12-13 MED ORDER — VITAMIN B-1 100 MG PO TABS: 100.0000 mg | ORAL_TABLET | Freq: Every day | ORAL | Status: DC

## 2013-12-13 MED ORDER — FUROSEMIDE 20 MG PO TABS
20.0000 mg | ORAL_TABLET | Freq: Once | ORAL | Status: AC
  Administered 2013-12-13: 20 mg via ORAL
  Filled 2013-12-13: qty 1

## 2013-12-13 MED ORDER — AZITHROMYCIN 500 MG PO TABS: 500.0000 mg | ORAL_TABLET | Freq: Every day | ORAL | Status: DC

## 2013-12-13 MED ORDER — FOLIC ACID 1 MG PO TABS: 1.0000 mg | ORAL_TABLET | Freq: Every day | ORAL | Status: DC

## 2013-12-13 NOTE — Discharge Instructions (Signed)
Follow with Primary MD Thayer HeadingsMACKENZIE,BRIAN, MD in 3 days   Get CBC, CMP, 2 view Chest X ray checked  by Primary MD next visit.    Activity: As tolerated with Full fall precautions use walker/cane & assistance as needed   Disposition Home     Diet: Heart Healthy . 1.2 L fluid restriction daily which includes coffee, soda, water.   On your next visit with your primary care physician please Get Medicines reviewed and adjusted.   Please request your Prim.MD to go over all Hospital Tests and Procedure/Radiological results at the follow up, please get all Hospital records sent to your Prim MD by signing hospital release before you go home.   If you experience worsening of your admission symptoms, develop shortness of breath, life threatening emergency, suicidal or homicidal thoughts you must seek medical attention immediately by calling 911 or calling your MD immediately  if symptoms less severe.  You Must read complete instructions/literature along with all the possible adverse reactions/side effects for all the Medicines you take and that have been prescribed to you. Take any new Medicines after you have completely understood and accpet all the possible adverse reactions/side effects.   Do not drive, operating heavy machinery, perform activities at heights, swimming or participation in water activities or provide baby sitting services if your were admitted for syncope or siezures until you have seen by Primary MD or a Neurologist and advised to do so again.  Do not drive when taking Pain medications.    Do not take more than prescribed Pain, Sleep and Anxiety Medications  Special Instructions: If you have smoked or chewed Tobacco  in the last 2 yrs please stop smoking, stop any regular Alcohol  and or any Recreational drug use.  Wear Seat belts while driving.   Please note  You were cared for by a hospitalist during your hospital stay. If you have any questions about your discharge  medications or the care you received while you were in the hospital after you are discharged, you can call the unit and asked to speak with the hospitalist on call if the hospitalist that took care of you is not available. Once you are discharged, your primary care physician will handle any further medical issues. Please note that NO REFILLS for any discharge medications will be authorized once you are discharged, as it is imperative that you return to your primary care physician (or establish a relationship with a primary care physician if you do not have one) for your aftercare needs so that they can reassess your need for medications and monitor your lab values.

## 2013-12-13 NOTE — Discharge Summary (Signed)
[name], is a 63 y.o. male  DOB 1950-05-06  MRN [id number].  Admission date:  12/10/2013  Admitting Physician  [name] Costa, MD  Discharge Date:  12/13/2013   Primary MD  [name] Sheller, MD  Recommendations for primary care physician for things to follow:   Check CBC, BMP and a 2 view chest x-ray in 3 days.  Sodium drops again consider changing his psych medications as taken cause SIADH.   Admission Diagnosis  Hyponatremia [E87.1] Fall [W19.XXXA] Chest pain [R07.9] Left rib fracture, closed, initial encounter [S22.32XA]   Discharge Diagnosis  Hyponatremia [E87.1] Fall [W19.XXXA] Chest pain [R07.9] Left rib fracture, closed, initial encounter [S22.32XA]    Principal Problem:   Hyponatremia Active Problems:   CAD (coronary artery disease)   HTN (hypertension)   Polysubstance abuse   Alcohol dependence   Anxiety   Bipolar disorder   Depression   Fall   CAP (community acquired pneumonia)      Past Medical History  Diagnosis Date  . MVP (mitral valve prolapse)   . Anxiety   . Hypertension   . Hyperlipidemia   . Palpitations   . Bipolar disorder   . Depression   . Esophagitis   . Gastritis   . Insomnia   . Hypercholesteremia   . Coronary artery disease     a.  s/p PCI to RCA in 2007;  b.  Last LHC 04/2008:  LM with irregularities, LAD 50%, mid LAD 50%, proximal D1 80-90%, proximal OM1 30-40%, proximal OM2 50%, PLB branch 30-40%, site of prior angioplasty in the RCA widely patent, EF 65%=>Med Rx;  c. Nuclear study a 12/2011: EF 71% and normal perfusion.    Marland Kitchen History of echocardiogram     Echo 08/16/09: Mild LVH, EF 55-60%, no normal wall motion, grade 1 diastolic dysfunction  . Anginal pain   . Pneumonia 2005  . GERD (gastroesophageal reflux disease)   . Arthritis     "hands; especially in cold weather"  (12/11/2013)  . Alcohol related seizure     "when I go off it"    Past Surgical History  Procedure Laterality Date  . Coronary angioplasty  2007    RCA  . Cardiac catheterization  04/2008    EF 65%  . Knee arthroscopy, medial patello femoral ligament repair    . Hip arthroplasty Bilateral     "both replaced twice"   . Total shoulder replacement Right   . Total hip revision  02/07/2012    Procedure: TOTAL HIP REVISION;  Surgeon: Kerin Salen, MD;  Location: [facility];  Service: Orthopedics;  Laterality: Left;  [place] Hip Revision Set  . Total knee arthroplasty Right   . Appendectomy  1958  . Tonsillectomy  1958  . Joint replacement         History of present illness and  Hospital Course:     Kindly see H&P for history of present illness and admission details, please review complete Labs, Consult reports and Test reports for all details in brief  HPI  from the history and physical done on the day of admission   [name] is a 63 y.o. male with past medical history for alcohol abuse, coronary artery disease, hypertension, hyperlipidemia, depression, bipolar, who presents with shortness of breath, right flank pain and fall.  Patient reports that 3 days ago he had fall about 11 PM after he came back from outside and wetted his shoes. He injured his head and left hip, and started having headache and left hip pain. His pain has been persistent. He took Tylenol with temporary relief. He does not have weakness, numbness or tenderness incisions in his extremities. He does not have vision change, hearing loss.  Patient also reports having shortness of breath recently. But no cough, chest pain, fever or chills. Patient does not have nausea, vomiting, diarrhea, abdominal pain. No symptoms of UTI. No leg edema. He reports that he has been drinking alcohol, last drink was 2 weeks ago.  Work up in the ED demonstrates Leukocytosis with WBC 15.9; right upper lobe infiltration on chest x-ray,  possible left fifth rib fracture, hyponatremia with sodium 110. CT head in the spine is negative for acute abnormalities. left hip x-ray showed no acute abnormalities. He is admitted to inpatient for further evaluation and treatment.    Hospital Course    Hyponatremia: Etiology is not clear. 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 On exam was euvolemic. Seen by renal. Initially received 3% normal saline. With fluid restriction and low dose Lasix sodium has corrected well, discussed with nephrologist Dr. Posey Pronto good for discharge. Request PCP to check BMP in 3 days. If sodium drops again consider switching his psych medications around as they [name] cause SIADH as well. Written fluid restriction orders provided to the patient.    CAP: Negative urine legionella and S. pneumococcal antigen, and date negative blood cultures. Was on Rocephin plus azithromycin empiric. Currently afebrile, no oxygen need, transitioned to azithromycin for 5 more days with outpatient PCP follow-up. Request PCP to check two-view chest x-ray in 3-7 days.    Alcohl abuse:  last drink over 3 weeks ago per patient (inconsistent history), counseled to quit. No signs of DTs place on folic acid thiamine upon discharge.    Fall: Mechanical fall with left fifth rib fracture. Supportive care    Bipolar disorder and depression: Stable. No suicidal or homicidal ideation. Resume home medications, if sodium drops again consider changing his home psych medications as they [name] cause SIADH.    Coronary artery disease: No chest pain, observe closely, Continue Lipitor, aspirin    GERD: Continue PPI      Discharge Condition: stable   Follow UP  Follow-up Information    Follow up with [name] Sheller, MD. Schedule an appointment as soon as possible for a visit in 3 days.   Specialty:  Internal Medicine    Contact information:   [place], [place]  [id number] [phone]         Discharge Instructions  and  Discharge Medications      Discharge Instructions    Discharge instructions    Complete by:  As directed   Follow with Primary MD [name] Sheller, MD in 3 days   Get CBC, CMP, 2 view Chest X ray checked  by Primary MD next visit.    Activity: As tolerated with Full fall precautions use walker/cane & assistance  as needed   Disposition Home     Diet: Heart Healthy . 1.2 L fluid restriction daily which includes coffee, soda, water.   On your next visit with your primary care physician please Get Medicines reviewed and adjusted.   Please request your Prim.MD to go over all Hospital Tests and Procedure/Radiological results at the follow up, please get all Hospital records sent to your Prim MD by signing hospital release before you go home.   If you experience worsening of your admission symptoms, develop shortness of breath, life threatening emergency, suicidal or homicidal thoughts you must seek medical attention immediately by calling 911 or calling your MD immediately  if symptoms less severe.  You Must read complete instructions/literature along with all the possible adverse reactions/side effects for all the Medicines you take and that have been prescribed to you. Take any new Medicines after you have completely understood and accpet all the possible adverse reactions/side effects.   Do not drive, operating heavy machinery, perform activities at heights, swimming or participation in water activities or provide baby sitting services if your were admitted for syncope or siezures until you have seen by Primary MD or a Neurologist and advised to do so again.  Do not drive when taking Pain medications.    Do not take more than prescribed Pain, Sleep and Anxiety Medications  Special Instructions: If you have smoked or chewed Tobacco  in the last 2 yrs  please stop smoking, stop any regular Alcohol  and or any Recreational drug use.  Wear Seat belts while driving.   Please note  You were cared for by a hospitalist during your hospital stay. If you have any questions about your discharge medications or the care you received while you were in the hospital after you are discharged, you [name] call the unit and asked to speak with the hospitalist on call if the hospitalist that took care of you is not available. Once you are discharged, your primary care physician will handle any further medical issues. Please note that NO REFILLS for any discharge medications will be authorized once you are discharged, as it is imperative that you return to your primary care physician (or establish a relationship with a primary care physician if you do not have one) for your aftercare needs so that they [name] reassess your need for medications and monitor your lab values.     Increase activity slowly    Complete by:  As directed             Medication List    TAKE these medications        aspirin EC 325 MG tablet  Take 1 tablet (325 mg total) by mouth every morning. For blood thinner for heart health     atorvastatin 40 MG tablet  Commonly known as:  LIPITOR  Take 1 tablet (40 mg total) by mouth daily. For cholesterol     azithromycin 500 MG tablet  Commonly known as:  ZITHROMAX  Take 1 tablet (500 mg total) by mouth daily.     buPROPion 300 MG 24 hr tablet  Commonly known as:  WELLBUTRIN XL  Take 1 tablet (300 mg total) by mouth daily. For depression     cloNIDine 0.1 MG tablet  Commonly known as:  CATAPRES  Take 1 tablet (0.1 mg total) by mouth 2 (two) times daily.     folic acid 1 MG tablet  Commonly known as:  FOLVITE  Take 1 tablet (1 mg total)  by mouth daily.     hydrOXYzine 25 MG tablet  Commonly known as:  ATARAX/VISTARIL  Take 1 tablet (25 mg total) by mouth every 6 (six) hours as needed for anxiety.     metoprolol succinate 25 MG 24  hr tablet  Commonly known as:  TOPROL-XL  Take 1 tablet (25 mg total) by mouth daily.     nitroGLYCERIN 0.4 MG SL tablet  Commonly known as:  NITROSTAT  Place 1 tablet (0.4 mg total) under the tongue every 5 (five) minutes as needed for chest pain (CP or SOB).     pantoprazole 40 MG tablet  Commonly known as:  PROTONIX  Take 1 tablet (40 mg total) by mouth daily.     QUEtiapine 50 MG Tb24 24 hr tablet  Commonly known as:  SEROQUEL XR  Take 5 tablets (250 mg total) by mouth at bedtime.     thiamine 100 MG tablet  Commonly known as:  VITAMIN B-1  Take 1 tablet (100 mg total) by mouth daily.          Diet and Activity recommendation: See Discharge Instructions above   Consults obtained - renal   Major procedures and Radiology Reports - PLEASE review detailed and final reports for all details, in brief -       Dg Chest 2 View  12/10/2013   CLINICAL DATA:  Dizziness, fell from standing position 4 days ago, chest pain, LEFT shoulder pain, personal history of coronary artery disease, hypertension  EXAM: CHEST  2 VIEW  COMPARISON:  08/09/2013  FINDINGS: Mild enlargement of cardiac silhouette.  Mildly tortuous aorta.  Mediastinal contours and pulmonary vascularity normal.  Mild infiltrate in RIGHT upper lobe.  Minimal atelectasis or infiltrate in LEFT mid lung.  Remaining lungs clear.  No pleural effusion or pneumothorax.  Bones appear demineralized.  Probable fracture anterior LEFT fifth rib.  Old healed fracture posterior RIGHT eighth and questionably ninth ribs.  IMPRESSION: Mild enlargement of cardiac silhouette.  Mild RIGHT upper lobe infiltrate with mild atelectasis versus infiltrate in LEFT mid lung.  Suspected acute fracture anterior LEFT fifth rib.   Electronically Signed   By: Lavonia Dana M.D.   On: 12/10/2013 18:58   Dg Hip Complete Left  12/10/2013   CLINICAL DATA:  Fall from standing position with left hip pain, initial encounter  EXAM: LEFT HIP - COMPLETE 2+ VIEW   COMPARISON:  None.  FINDINGS: Bilateral hip prostheses are seen. No will fracture or loosening is identified. No dislocation of the femoral components is seen. No gross soft tissue abnormality is noted.  IMPRESSION: Postsurgical change without acute abnormality.   Electronically Signed   By: Inez Catalina M.D.   On: 12/10/2013 18:58   Ct Head Wo Contrast  12/10/2013   CLINICAL DATA:  Dizziness and mental status changes since a fall on Sunday. Reports he tripped in the bathroom and struck his forehead on the concrete floor. Denies LOC  EXAM: CT HEAD WITHOUT CONTRAST  CT CERVICAL SPINE WITHOUT CONTRAST  TECHNIQUE: Multidetector CT imaging of the head and cervical spine was performed following the standard protocol without intravenous contrast. Multiplanar CT image reconstructions of the cervical spine were also generated.  COMPARISON:  07/31/2011  FINDINGS: CT HEAD FINDINGS  Sinuses/Soft tissues: Right nasal bone fracture and left nasal bone subluxation medially. Unchanged and without overlying soft tissue swelling.  Mucosal thickening of the right maxillary sinus is improved. Partial opacification of the right frontal sinus is chronic. Clear mastoid air  cells. No skull fracture.  Intracranial: Age advanced cerebral atrophy. Moderate low density in the periventricular white matter likely related to small vessel disease. Carotid and vertebral basilar atherosclerosis bilaterally.  CT CERVICAL SPINE FINDINGS  Spinal visualization through the bottom of T1. Prevertebral soft tissues are within normal limits. Left greater than right carotid atherosclerosis. Right apical pleural parenchymal scarring. No apical pneumothorax. Multilevel spondylosis. Left greater than right neural foraminal narrowing at C3-4 secondary to uncovertebral joint hypertrophy. Left-sided narrowing at C4-5 with left greater than right narrowing at C5-6. Also due to uncovertebral joint hypertrophy.  Maintenance of vertebral body height and alignment.  Advanced degenerative disc disease at C4 through T1. Extensive facet arthropathy. Coronal reformats demonstrate a normal C1-C2 articulation.  IMPRESSION: 1. No acute intracranial abnormality. Small vessel ischemic change. 2.  No acute fracture or subluxation within the cervical spine. 3. Advanced spondylosis.   Electronically Signed   By: Abigail Miyamoto M.D.   On: 12/10/2013 18:44   Ct Cervical Spine Wo Contrast  12/10/2013   CLINICAL DATA:  Dizziness and mental status changes since a fall on Sunday. Reports he tripped in the bathroom and struck his forehead on the concrete floor. Denies LOC  EXAM: CT HEAD WITHOUT CONTRAST  CT CERVICAL SPINE WITHOUT CONTRAST  TECHNIQUE: Multidetector CT imaging of the head and cervical spine was performed following the standard protocol without intravenous contrast. Multiplanar CT image reconstructions of the cervical spine were also generated.  COMPARISON:  07/31/2011  FINDINGS: CT HEAD FINDINGS  Sinuses/Soft tissues: Right nasal bone fracture and left nasal bone subluxation medially. Unchanged and without overlying soft tissue swelling.  Mucosal thickening of the right maxillary sinus is improved. Partial opacification of the right frontal sinus is chronic. Clear mastoid air cells. No skull fracture.  Intracranial: Age advanced cerebral atrophy. Moderate low density in the periventricular white matter likely related to small vessel disease. Carotid and vertebral basilar atherosclerosis bilaterally.  CT CERVICAL SPINE FINDINGS  Spinal visualization through the bottom of T1. Prevertebral soft tissues are within normal limits. Left greater than right carotid atherosclerosis. Right apical pleural parenchymal scarring. No apical pneumothorax. Multilevel spondylosis. Left greater than right neural foraminal narrowing at C3-4 secondary to uncovertebral joint hypertrophy. Left-sided narrowing at C4-5 with left greater than right narrowing at C5-6. Also due to uncovertebral joint  hypertrophy.  Maintenance of vertebral body height and alignment. Advanced degenerative disc disease at C4 through T1. Extensive facet arthropathy. Coronal reformats demonstrate a normal C1-C2 articulation.  IMPRESSION: 1. No acute intracranial abnormality. Small vessel ischemic change. 2.  No acute fracture or subluxation within the cervical spine. 3. Advanced spondylosis.   Electronically Signed   By: Abigail Miyamoto M.D.   On: 12/10/2013 18:44   Dg Chest Port 1 View  12/10/2013   CLINICAL DATA:  Status post PICC line placement, shortness of breath  EXAM: PORTABLE CHEST - 1 VIEW  COMPARISON:  12/10/2013  FINDINGS: Cardiac shadow is enlarged but stable. A left-sided PICC line is now noted with the tip at the cavoatrial junction. With patchy infiltrative changes seen on the prior exam are again noted and stable. No new focal abnormality is seen.  IMPRESSION: Status post PICC line placement in satisfactory position. The remainder of the exam is stable.   Electronically Signed   By: Inez Catalina M.D.   On: 12/10/2013 23:28   Dg Shoulder Left  12/10/2013   CLINICAL DATA:  Fall from standing position with left shoulder pain, initial encounter  EXAM: LEFT SHOULDER -  2+ VIEW  COMPARISON:  None.  FINDINGS: There is no evidence of fracture or dislocation. There is no evidence of arthropathy or other focal bone abnormality. Soft tissues are unremarkable.  IMPRESSION: No acute abnormality noted.   Electronically Signed   By: Inez Catalina M.D.   On: 12/10/2013 18:58   Dg Hand Complete Left  12/10/2013   CLINICAL DATA:  Fall from standing position with left hand pain, initial encounter  EXAM: LEFT HAND - COMPLETE 3+ VIEW  COMPARISON:  None.  FINDINGS: No acute fracture or dislocation is noted. Widening of the distal radius is noted consistent with prior fracture and healing. No gross soft tissue abnormality is seen.  IMPRESSION: No acute abnormality noted.   Electronically Signed   By: Inez Catalina M.D.   On:  12/10/2013 18:57    Micro Results      Recent Results (from the past 240 hour(s))  Urine culture     Status: None   Collection Time: 12/10/13  9:02 PM  Result Value Ref Range Status   Specimen Description URINE, RANDOM  Final   Special Requests NONE  Final   Culture  Setup Time   Final    12/10/2013 21:51 Performed at [place] Performed at [facility]   Final   Culture NO GROWTH Performed at [facility]   Final   Report Status 12/12/2013 FINAL  Final  Respiratory virus panel     Status: None   Collection Time: 12/10/13 10:03 PM  Result Value Ref Range Status   Source - RVPAN NASAL SWAB  Corrected    Comment: CORRECTED ON 11/20 AT 2132: PREVIOUSLY REPORTED AS NASAL SWAB   Respiratory Syncytial Virus A NOT DETECTED  Final   Respiratory Syncytial Virus B NOT DETECTED  Final   Influenza A NOT DETECTED  Final   Influenza B NOT DETECTED  Final   Parainfluenza 1 NOT DETECTED  Final   Parainfluenza 2 NOT DETECTED  Final   Parainfluenza 3 NOT DETECTED  Final   Metapneumovirus NOT DETECTED  Final   Rhinovirus NOT DETECTED  Final   Adenovirus NOT DETECTED  Final   Influenza A H1 NOT DETECTED  Final   Influenza A H3 NOT DETECTED  Final    Comment: (NOTE)       Normal Reference Range for each Analyte: NOT DETECTED Testing performed using the Luminex xTAG Respiratory Viral Panel test kit. The analytical performance characteristics of this assay have been determined by [facility].  The modifications have not been cleared or approved by the FDA. This assay has been validated pursuant to the CLIA regulations and is used for clinical purposes. Performed at [facility], blood (routine x 2)     Status: None (Preliminary result)   Collection Time: 12/11/13 12:08 AM  Result Value Ref Range Status   Specimen Description BLOOD LEFT FOOT  Final   Special Requests BOTTLES DRAWN AEROBIC ONLY 10CC  Final     Culture  Setup Time   Final    12/11/2013 04:44 Performed at [facility]    Culture   Final           BLOOD CULTURE RECEIVED NO GROWTH TO DATE CULTURE WILL BE HELD FOR 5 DAYS BEFORE ISSUING A FINAL NEGATIVE REPORT Performed at [facility]    Report Status PENDING  Incomplete  Culture, blood (routine x 2)  Status: None (Preliminary result)   Collection Time: 12/11/13  2:00 AM  Result Value Ref Range Status   Specimen Description BLOOD RIGHT FOOT  Final   Special Requests BOTTLES DRAWN AEROBIC AND ANAEROBIC 10CC EACH  Final   Culture  Setup Time   Final    12/11/2013 12:41 Performed at [facility]    Culture   Final           BLOOD CULTURE RECEIVED NO GROWTH TO DATE CULTURE WILL BE HELD FOR 5 DAYS BEFORE ISSUING A FINAL NEGATIVE REPORT Performed at [facility]    Report Status PENDING  Incomplete  MRSA PCR Screening     Status: None   Collection Time: 12/11/13  3:45 AM  Result Value Ref Range Status   MRSA by PCR NEGATIVE NEGATIVE Final    Comment:        The GeneXpert MRSA Assay (FDA approved for NASAL specimens only), is one component of a comprehensive MRSA colonization surveillance program. It is not intended to diagnose MRSA infection nor to guide or monitor treatment for MRSA infections.        Today   Subjective:   [name] today has no headache,no chest abdominal pain,no new weakness tingling or numbness, feels much better wants to go home today.    Objective:   Blood pressure 119/68, pulse 87, temperature 98.4 F (36.9 C), temperature source Oral, resp. rate 16, height 5' 10"  (1.778 m), weight 90.5 kg (199 lb 8.3 oz), SpO2 96 %.   Intake/Output Summary (Last 24 hours) at 12/13/13 1116 Last data filed at 12/13/13 0840  Gross per 24 hour  Intake    120 ml  Output      0 ml  Net    120 ml    Exam Awake Alert, Oriented x 3, No new F.N deficits, Normal affect [place].AT,PERRAL Supple Neck,No JVD, No cervical  lymphadenopathy appriciated.  Symmetrical Chest wall movement, Good air movement bilaterally, CTAB RRR,No Gallops,Rubs or new Murmurs, No Parasternal Heave +ve B.Sounds, Abd Soft, Non tender, No organomegaly appriciated, No rebound -guarding or rigidity. No Cyanosis, Clubbing or edema, No new Rash or bruise  Data Review   CBC w Diff: Lab Results  Component Value Date   WBC 11.4* 12/11/2013   HGB 9.3* 12/11/2013   HCT 26.7* 12/11/2013   PLT 175 12/11/2013   LYMPHOPCT 18 12/11/2013   MONOPCT 11 12/11/2013   EOSPCT 1 12/11/2013   BASOPCT 0 12/11/2013    CMP: Lab Results  Component Value Date   NA 128* 12/13/2013   K 3.9 12/13/2013   CL 92* 12/13/2013   CO2 21 12/13/2013   BUN 15 12/13/2013   CREATININE 0.89 12/13/2013   PROT 5.9* 12/11/2013   ALBUMIN 3.0* 12/11/2013   BILITOT 0.5 12/11/2013   ALKPHOS 54 12/11/2013   AST 37 12/11/2013   ALT 27 12/11/2013  .   Total Time in preparing paper work, data evaluation and todays exam - 35 minutes  Thurnell Lose M.D on 12/13/2013 at 11:16 AM  [facility] Office  [phone]

## 2013-12-14 NOTE — Care Management Note (Signed)
    Page 1 of 2   12/14/2013     8:59:03 AM CARE MANAGEMENT NOTE 12/14/2013  Patient:  Kandis BanHAM,Pinkney M   Account Number:  [id number]  Date Initiated:  12/11/2013  Documentation initiated by:  [facility]Roni Scow  Subjective/Objective Assessment:   Admitted post fall - SOB -  hyponatremia.     Action/Plan:   discharge planning   Anticipated DC Date:  12/16/2013   Anticipated DC Plan:  HOME W HOME HEALTH SERVICES      DC Planning Services  CM consult      Choice offered to / List presented to:  C-1 Patient      DME agency  [facility]     HH arranged  [facility] PT      [facility]   Status of service:  Completed, signed off Medicare Important Message given?   (If response is "NO", the following Medicare IM given date fields will be blank) Date Medicare IM given:   Medicare IM given by:   Date Additional Medicare IM given:   Additional Medicare IM given by:    Discharge Disposition:  HOME W HOME HEALTH SERVICES  Per UR Regulation:  Reviewed for med. necessity/level of care/duration of stay  If discussed at Long Length of Stay Meetings, dates discussed:    Comments:  12/14/13 08:50 CM notes pt discharged without receiving rolling walker.  CM called [facility] rep, Germaine who states he will take care of situation and call patient.  No other CM needs were communicated. Freddy JakschSarah Karon Cotterill, BSN, Caryl AdaM [phone].  12/13/13 17:00 CM recreived call from RN stating rolling walker had not been delivered as of yet.  Cm called [facility] rep, James and left voicemail to please deliver rollling walker to room.  RN states pt to have HHPT with [facility]. [facility] Miranda.  No other CM needs were communicted.  Freddy JakschSarah Maley Venezia, BSN, CM [phone].   Contact:  Winsor,Bob Friend [phone]      McCoy,Matt Friend   [phone]

## 2013-12-17 LAB — CULTURE, BLOOD (ROUTINE X 2)
CULTURE: NO GROWTH
Culture: NO GROWTH

## 2014-02-26 ENCOUNTER — Other Ambulatory Visit (HOSPITAL_COMMUNITY): Payer: Self-pay | Admitting: Internal Medicine

## 2014-02-26 DIAGNOSIS — R945 Abnormal results of liver function studies: Secondary | ICD-10-CM

## 2014-02-26 DIAGNOSIS — R7989 Other specified abnormal findings of blood chemistry: Secondary | ICD-10-CM

## 2014-03-02 ENCOUNTER — Other Ambulatory Visit: Payer: Self-pay | Admitting: Oncology

## 2014-03-02 ENCOUNTER — Ambulatory Visit (HOSPITAL_COMMUNITY)
Admission: RE | Admit: 2014-03-02 | Discharge: 2014-03-02 | Disposition: A | Discharge status: Home / Self Care | Payer: Medicare Other | Source: Ambulatory Visit | Attending: Internal Medicine | Admitting: Internal Medicine

## 2014-03-02 ENCOUNTER — Ambulatory Visit (HOSPITAL_COMMUNITY): Payer: Medicare Other

## 2014-03-02 DIAGNOSIS — R7989 Other specified abnormal findings of blood chemistry: Secondary | ICD-10-CM | POA: Insufficient documentation

## 2014-03-02 DIAGNOSIS — R945 Abnormal results of liver function studies: Secondary | ICD-10-CM

## 2014-03-03 ENCOUNTER — Emergency Department (HOSPITAL_COMMUNITY): Payer: Medicare Other

## 2014-03-03 ENCOUNTER — Encounter (HOSPITAL_COMMUNITY): Payer: Self-pay | Admitting: *Deleted

## 2014-03-03 ENCOUNTER — Emergency Department (HOSPITAL_COMMUNITY)
Admission: EM | Admit: 2014-03-03 | Discharge: 2014-03-03 | Disposition: A | Discharge status: Home / Self Care | Payer: Medicare Other | Attending: Emergency Medicine | Admitting: Emergency Medicine

## 2014-03-03 DIAGNOSIS — Z8701 Personal history of pneumonia (recurrent): Secondary | ICD-10-CM | POA: Insufficient documentation

## 2014-03-03 DIAGNOSIS — I341 Nonrheumatic mitral (valve) prolapse: Secondary | ICD-10-CM | POA: Insufficient documentation

## 2014-03-03 DIAGNOSIS — Z792 Long term (current) use of antibiotics: Secondary | ICD-10-CM | POA: Insufficient documentation

## 2014-03-03 DIAGNOSIS — G4700 Insomnia, unspecified: Secondary | ICD-10-CM | POA: Diagnosis not present

## 2014-03-03 DIAGNOSIS — F419 Anxiety disorder, unspecified: Secondary | ICD-10-CM | POA: Diagnosis not present

## 2014-03-03 DIAGNOSIS — F319 Bipolar disorder, unspecified: Secondary | ICD-10-CM | POA: Insufficient documentation

## 2014-03-03 DIAGNOSIS — E785 Hyperlipidemia, unspecified: Secondary | ICD-10-CM | POA: Diagnosis not present

## 2014-03-03 DIAGNOSIS — I10 Essential (primary) hypertension: Secondary | ICD-10-CM | POA: Diagnosis not present

## 2014-03-03 DIAGNOSIS — R0602 Shortness of breath: Secondary | ICD-10-CM | POA: Insufficient documentation

## 2014-03-03 DIAGNOSIS — Z87891 Personal history of nicotine dependence: Secondary | ICD-10-CM | POA: Diagnosis not present

## 2014-03-03 DIAGNOSIS — Z9861 Coronary angioplasty status: Secondary | ICD-10-CM | POA: Insufficient documentation

## 2014-03-03 DIAGNOSIS — I2510 Atherosclerotic heart disease of native coronary artery without angina pectoris: Secondary | ICD-10-CM | POA: Diagnosis not present

## 2014-03-03 DIAGNOSIS — M1990 Unspecified osteoarthritis, unspecified site: Secondary | ICD-10-CM | POA: Insufficient documentation

## 2014-03-03 DIAGNOSIS — Z9889 Other specified postprocedural states: Secondary | ICD-10-CM | POA: Insufficient documentation

## 2014-03-03 DIAGNOSIS — K219 Gastro-esophageal reflux disease without esophagitis: Secondary | ICD-10-CM | POA: Diagnosis not present

## 2014-03-03 DIAGNOSIS — Z7982 Long term (current) use of aspirin: Secondary | ICD-10-CM | POA: Diagnosis not present

## 2014-03-03 DIAGNOSIS — M546 Pain in thoracic spine: Secondary | ICD-10-CM | POA: Diagnosis not present

## 2014-03-03 DIAGNOSIS — Z79899 Other long term (current) drug therapy: Secondary | ICD-10-CM | POA: Insufficient documentation

## 2014-03-03 LAB — BASIC METABOLIC PANEL
Anion gap: 11 (ref 5–15)
BUN: 12 mg/dL (ref 6–23)
CALCIUM: 9.4 mg/dL (ref 8.4–10.5)
CO2: 21 mmol/L (ref 19–32)
CREATININE: 1.32 mg/dL (ref 0.50–1.35)
Chloride: 105 mmol/L (ref 96–112)
GFR calc non Af Amer: 56 mL/min — ABNORMAL LOW (ref >90)
GFR, EST AFRICAN AMERICAN: 65 mL/min — AB (ref >90)
Glucose, Bld: 61 mg/dL — ABNORMAL LOW (ref 70–99)
Potassium: 4.8 mmol/L (ref 3.5–5.1)
Sodium: 137 mmol/L (ref 135–145)

## 2014-03-03 LAB — CBC
HCT: 33.7 % — ABNORMAL LOW (ref 39.0–52.0)
HEMOGLOBIN: 10.6 g/dL — AB (ref 13.0–17.0)
MCH: 27.2 pg (ref 26.0–34.0)
MCHC: 31.5 g/dL (ref 30.0–36.0)
MCV: 86.4 fL (ref 78.0–100.0)
Platelets: 327 10*3/uL (ref 150–400)
RBC: 3.9 MIL/uL — ABNORMAL LOW (ref 4.22–5.81)
RDW: 16.9 % — ABNORMAL HIGH (ref 11.5–15.5)
WBC: 6.4 10*3/uL (ref 4.0–10.5)

## 2014-03-03 LAB — D-DIMER, QUANTITATIVE: D-Dimer, Quant: 0.38 ug/mL-FEU (ref 0.00–0.48)

## 2014-03-03 LAB — I-STAT TROPONIN, ED: Troponin i, poc: 0 ng/mL (ref 0.00–0.08)

## 2014-03-03 LAB — BRAIN NATRIURETIC PEPTIDE: B Natriuretic Peptide: 41.1 pg/mL (ref 0.0–100.0)

## 2014-03-03 MED ORDER — CYCLOBENZAPRINE HCL 5 MG PO TABS: 5.0000 mg | ORAL_TABLET | Freq: Three times a day (TID) | ORAL | Status: DC | PRN

## 2014-03-03 MED ORDER — OXYCODONE-ACETAMINOPHEN 5-325 MG PO TABS: 1.0000 | ORAL_TABLET | Freq: Four times a day (QID) | ORAL | Status: DC | PRN

## 2014-03-03 NOTE — Discharge Instructions (Signed)
Back Pain, Adult Low back pain is very common. About 1 in 5 people have back pain.The cause of low back pain is rarely dangerous. The pain often gets better over time.About half of people with a sudden onset of back pain feel better in just 2 weeks. About 8 in 10 people feel better by 6 weeks.  CAUSES Some common causes of back pain include:  Strain of the muscles or ligaments supporting the spine.  Wear and tear (degeneration) of the spinal discs.  Arthritis.  Direct injury to the back. DIAGNOSIS Most of the time, the direct cause of low back pain is not known.However, back pain can be treated effectively even when the exact cause of the pain is unknown.Answering your caregiver's questions about your overall health and symptoms is one of the most accurate ways to make sure the cause of your pain is not dangerous. If your caregiver needs more information, he or she may order lab work or imaging tests (X-rays or MRIs).However, even if imaging tests show changes in your back, this usually does not require surgery. HOME CARE INSTRUCTIONS For many people, back pain returns.Since low back pain is rarely dangerous, it is often a condition that people can learn to manageon their own.   Remain active. It is stressful on the back to sit or stand in one place. Do not sit, drive, or stand in one place for more than 30 minutes at a time. Take short walks on level surfaces as soon as pain allows.Try to increase the length of time you walk each day.  Do not stay in bed.Resting more than 1 or 2 days can delay your recovery.  Do not avoid exercise or work.Your body is made to move.It is not dangerous to be active, even though your back may hurt.Your back will likely heal faster if you return to being active before your pain is gone.  Pay attention to your body when you bend and lift. Many people have less discomfortwhen lifting if they bend their knees, keep the load close to their bodies,and  avoid twisting. Often, the most comfortable positions are those that put less stress on your recovering back.  Find a comfortable position to sleep. Use a firm mattress and lie on your side with your knees slightly bent. If you lie on your back, put a pillow under your knees.  Only take over-the-counter or prescription medicines as directed by your caregiver. Over-the-counter medicines to reduce pain and inflammation are often the most helpful.Your caregiver may prescribe muscle relaxant drugs.These medicines help dull your pain so you can more quickly return to your normal activities and healthy exercise.  Put ice on the injured area.  Put ice in a plastic bag.  Place a towel between your skin and the bag.  Leave the ice on for 15-20 minutes, 03-04 times a day for the first 2 to 3 days. After that, ice and heat may be alternated to reduce pain and spasms.  Ask your caregiver about trying back exercises and gentle massage. This may be of some benefit.  Avoid feeling anxious or stressed.Stress increases muscle tension and can worsen back pain.It is important to recognize when you are anxious or stressed and learn ways to manage it.Exercise is a great option. SEEK MEDICAL CARE IF:  You have pain that is not relieved with rest or medicine.  You have pain that does not improve in 1 week.  You have new symptoms.  You are generally not feeling well. SEEK   IMMEDIATE MEDICAL CARE IF:   You have pain that radiates from your back into your legs.  You develop new bowel or bladder control problems.  You have unusual weakness or numbness in your arms or legs.  You develop nausea or vomiting.  You develop abdominal pain.  You feel faint. Document Released: 01/09/2005 Document Revised: 07/11/2011 Document Reviewed: 05/13/2013 Exit[employer] Patient Information 2015 [employer]. This information is not intended to replace advice given to you by your health care provider. Make sure you  discuss any questions you have with your health care provider.  

## 2014-03-03 NOTE — ED Provider Notes (Signed)
CSN: [id number]     Arrival date & time 03/03/14  1552 History   First MD Initiated Contact with Patient 03/03/14 1936     Chief Complaint  Patient presents with  . Back Pain  . Shortness of Breath    Patient is a 64 y.o. male presenting with back pain. The history is provided by the patient.  Back Pain Location:  Thoracic spine (right side) Quality: sharp. Pain severity:  Moderate Timing:  Intermittent Relieved by:  Bed rest Exacerbated by: increases whenever he tries to sit up and bend. Associated symptoms: no dysuria, no fever and no leg pain   Associated symptoms comment:  No dysuria He took one of his friends percocet but it did not completely relieve the pain.  This concerned him.    When he takes a deep breath he does feel some shortness of breath.  No fevers or coughing.  Past Medical History  Diagnosis Date  . MVP (mitral valve prolapse)   . Anxiety   . Hypertension   . Hyperlipidemia   . Palpitations   . Bipolar disorder   . Depression   . Esophagitis   . Gastritis   . Insomnia   . Hypercholesteremia   . Coronary artery disease     a.  s/p PCI to RCA in 2007;  b.  Last LHC 04/2008:  LM with irregularities, LAD 50%, mid LAD 50%, proximal D1 80-90%, proximal OM1 30-40%, proximal OM2 50%, PLB branch 30-40%, site of prior angioplasty in the RCA widely patent, EF 65%=>Med Rx;  c. Nuclear study a 12/2011: EF 71% and normal perfusion.    Marland Kitchen. History of echocardiogram     Echo 08/16/09: Mild LVH, EF 55-60%, no normal wall motion, grade 1 diastolic dysfunction  . Anginal pain   . Pneumonia 2005  . GERD (gastroesophageal reflux disease)   . Arthritis     "hands; especially in cold weather" (12/11/2013)  . 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 Coronary angioplasty  2007    RCA  . Cardiac catheterization  04/2008    EF 65%  . Knee arthroscopy, medial patello femoral ligament repair    . Hip arthroplasty  Bilateral     "both replaced twice"   . Total shoulder replacement Right   . Total hip revision  02/07/2012    Procedure: TOTAL HIP REVISION;  Surgeon: Nestor LewandowskyFrank J Rowan, MD;  Location: [facility] OR;  Service: Orthopedics;  Laterality: Left;  [place] Hip Revision Set  . Total knee arthroplasty Right   . Appendectomy  1958  . Tonsillectomy  1958  . Joint replacement     Family History  Problem Relation Age of Onset  . Heart disease Neg Hx   . Prostate cancer Father    History  Substance Use Topics  . Smoking status: Former Smoker -- 1.50 packs/day for 30 years    Types: Cigarettes    Quit date: 08/12/2013  . Smokeless tobacco: Never Used  . Alcohol Use: 0.0 oz/week     Comment: 12/11/2013 "maybe 2-3 beers or a couple mixed drinks q couple weeks"    Review of Systems  Constitutional: Negative for fever.  Respiratory: Negative for cough.   Genitourinary: Negative for dysuria and flank pain.  Musculoskeletal: Positive for back pain.      Allergies  Ambien and Codeine  Home Medications   Prior to Admission medications   Medication  Sig Start Date End Date Taking? Authorizing Provider  aspirin EC 325 MG tablet Take 1 tablet (325 mg total) by mouth every morning. For blood thinner for heart health 08/15/13  Yes Nanine Means, NP  atorvastatin (LIPITOR) 40 MG tablet Take 1 tablet (40 mg total) by mouth daily. For cholesterol 10/24/12  Yes Sanjuana Kava, NP  bismuth subsalicylate (PEPTO BISMOL) 262 MG/15ML suspension Take 30 mLs by mouth every 6 (six) hours as needed for indigestion (indigestion).   Yes Historical Provider, MD  buPROPion (WELLBUTRIN XL) 300 MG 24 hr tablet Take 1 tablet (300 mg total) by mouth daily. For depression 10/24/12  Yes Sanjuana Kava, NP  cloNIDine (CATAPRES) 0.1 MG tablet Take 1 tablet (0.1 mg total) by mouth 2 (two) times daily. 08/15/13  Yes Nanine Means, NP  hydrOXYzine (ATARAX/VISTARIL) 25 MG tablet Take 1 tablet (25 mg total) by mouth every 6 (six) hours as needed  for anxiety. 10/24/12  Yes Sanjuana Kava, NP  metoprolol succinate (TOPROL-XL) 25 MG 24 hr tablet Take 1 tablet (25 mg total) by mouth daily. 08/15/13  Yes Nanine Means, NP  pantoprazole (PROTONIX) 40 MG tablet Take 1 tablet (40 mg total) by mouth daily. 08/15/13  Yes Nanine Means, NP  Pseudoephedrine-APAP-DM (DAYQUIL PO) Take 2 capsules by mouth at bedtime as needed (sleep).   Yes Historical Provider, MD  QUEtiapine (SEROQUEL XR) 50 MG TB24 24 hr tablet Take 5 tablets (250 mg total) by mouth at bedtime. Patient taking differently: Take 50-200 mg by mouth at bedtime. Take 1 Tablet (50 mg) in the am and Take 4 Tablets (200 mg) in the pm 08/15/13  Yes Nanine Means, NP  azithromycin (ZITHROMAX) 500 MG tablet Take 1 tablet (500 mg total) by mouth daily. Patient not taking: Reported on 03/03/2014 12/13/13   Leroy Sea, MD  cyclobenzaprine (FLEXERIL) 5 MG tablet Take 1 tablet (5 mg total) by mouth 3 (three) times daily as needed for muscle spasms. 03/03/14   Linwood Dibbles, MD  folic acid (FOLVITE) 1 MG tablet Take 1 tablet (1 mg total) by mouth daily. Patient not taking: Reported on 03/03/2014 12/13/13   Leroy Sea, MD  nitroGLYCERIN (NITROSTAT) 0.4 MG SL tablet Place 1 tablet (0.4 mg total) under the tongue every 5 (five) minutes as needed for chest pain (CP or SOB). 10/24/12   Sanjuana Kava, NP  oxyCODONE-acetaminophen (PERCOCET/ROXICET) 5-325 MG per tablet Take 1-2 tablets by mouth every 6 (six) hours as needed. 03/03/14   Linwood Dibbles, MD  thiamine (VITAMIN B-1) 100 MG tablet Take 1 tablet (100 mg total) by mouth daily. Patient not taking: Reported on 03/03/2014 12/13/13   Leroy Sea, MD   BP 118/74 mmHg  Pulse 69  Temp(Src) 98 F (36.7 C) (Oral)  Resp 18  SpO2 94% Physical Exam  Constitutional: He appears well-developed and well-nourished. No distress.  HENT:  Head: Normocephalic and atraumatic.  Right Ear: External ear normal.  Left Ear: External ear normal.  Eyes: Conjunctivae are normal.  Right eye exhibits no discharge. Left eye exhibits no discharge. No scleral icterus.  Neck: Neck supple. No tracheal deviation present.  Cardiovascular: Normal rate, regular rhythm and intact distal pulses.   Pulmonary/Chest: Effort normal and breath sounds normal. No stridor. No respiratory distress. He has no wheezes. He has no rales.   He exhibits tenderness.  Abdominal: Soft. Bowel sounds are normal. He exhibits no distension. There is no tenderness. There is no rebound and no guarding.  Musculoskeletal:  He exhibits no edema or tenderness.  Neurological: He is alert. He has normal strength. No cranial nerve deficit (no facial droop, extraocular movements intact, no slurred speech) or sensory deficit. He exhibits normal muscle tone. He displays no seizure activity. Coordination normal.  Skin: Skin is warm and dry. No rash noted.  Psychiatric: He has a normal mood and affect.  Nursing note and vitals reviewed.   ED Course  Procedures (including critical care time) Labs Review Labs Reviewed  BASIC METABOLIC PANEL - Abnormal; Notable for the following:    Glucose, Bld 61 (*)    GFR calc non Af Amer 56 (*)    GFR calc Af Amer 65 (*)    All other components within normal limits  CBC - Abnormal; Notable for the following:    RBC 3.90 (*)    Hemoglobin 10.6 (*)    HCT 33.7 (*)    RDW 16.9 (*)    All other components within normal limits  BRAIN NATRIURETIC PEPTIDE  D-DIMER, QUANTITATIVE  I-STAT TROPOININ, ED    Imaging Review Dg Chest 2 View (if Patient Has Fever And/or Copd)  03/03/2014   CLINICAL DATA:  Posterior right-sided chest pain/back pain for 3 days. Shortness of breath.  EXAM: CHEST  2 VIEW  COMPARISON:  12/10/2013  FINDINGS: Left PICC line has been removed. The cardiac silhouette remains mildly enlarged. Thoracic aorta is tortuous, similar to prior. Patchy right upper lobe opacities on the prior study have resolved. Minimal scarring versus subsegmental atelectasis is noted in  the left lung base. There is no evidence of new airspace consolidation, edema, pleural effusion, or pneumothorax. Chronic T6 compression fracture is again noted. Right shoulder arthroplasty is noted. Old right eighth rib fracture is again seen.  IMPRESSION: Interval clearing of right upper lobe infiltrate. No evidence of acute airspace disease.   Electronically Signed   By: Sebastian Ache   On: 03/03/2014 17:28   US Abdomen Complete  03/02/2014   CLINICAL DATA:  Elevated hepatic function studies  EXAM: ULTRASOUND ABDOMEN COMPLETE  COMPARISON:  CT scan of the abdomen and pelvis of January 11, 2013.  FINDINGS: Gallbladder: No gallstones or wall thickening visualized. No sonographic Murphy sign noted.  Common bile duct: Diameter: 3.6 mm  Liver: 2 hyperechoic foci are demonstrated in the liver. One measures 1.5 cm in greatest dimension in the other 1.4 cm. These likely reflect hemangiomas. There is no intrahepatic ductal dilation. The hepatic echotexture is otherwise normal.  IVC: No abnormality visualized.  Pancreas: Visualized portion unremarkable. The pancreatic tail was obscured by bowel gas.  Spleen: Size and appearance within normal limits.  Right Kidney: Length: 11.5 cm. Echogenicity within normal limits. There are simple appearing cysts in the upper and lower pole. One measures 1.7 cm in greatest dimension of the other measures 1.9 cm in greatest dimension. No hydronephrosis visualized.  Left Kidney: Length: 11.3 cm. Echogenicity within normal limits. There are cysts in the mid and lower pole which measure 1.90 1.1 cm respectively. No hydronephrosis visualized.  Abdominal aorta: No aneurysm visualized.  Other findings: There is no ascites.  IMPRESSION: 1. There are 2 hyperechoic foci within the liver which correspond to previously demonstrated hypodensities on CT scan and likely reflect hemangiomas. If the patient's elevated liver function studies remain unexplained, a a hepatic protocol MRI or CT scan would be  useful. 2. The gallbladder is unremarkable. The observed portions of the pancreas are normal. 3. There are bilateral renal cysts.  There is no hydronephrosis.  Electronically Signed   By: Dewaun  [place]   On: 03/02/2014 16:03     EKG Interpretation   Date/Time:  Tuesday March 03 2014 16:16:05 EST Ventricular Rate:  73 PR Interval:  273 QRS Duration: 90 QT Interval:  375 QTC Calculation: 413 R Axis:   5 Text Interpretation:  Normal sinus rhythm Prolonged PR interval Baseline  wander in lead(s) I II aVR V1 V3 no acute ST/T changes no significant  change since Nov 2015 Confirmed by Criss Alvine  MD, SCOTT ([id number]) on 03/03/2014  4:18:31 PM      MDM   Final diagnoses:  Right-sided thoracic back pain    Suspect the symptoms are related to musculoskeletal etiology.   Doubt cardiac or pulmonary etiology. Laboratory tests are unremarkable.    Linwood Dibbles, MD 03/03/14 2114

## 2014-03-03 NOTE — ED Notes (Addendum)
Pt reports mid back pain and SOB x3 days. Pain 10/10 with certain movements. Pt had ultrasound of liver yesterday. Pt reports hx of collapse lung many years ago, reports pain feels the same.

## 2014-03-30 ENCOUNTER — Emergency Department (HOSPITAL_COMMUNITY): Payer: Medicare Other

## 2014-03-30 ENCOUNTER — Encounter (HOSPITAL_COMMUNITY): Payer: Self-pay | Admitting: Emergency Medicine

## 2014-03-30 ENCOUNTER — Inpatient Hospital Stay (HOSPITAL_COMMUNITY)
Admission: EM | Admit: 2014-03-30 | Discharge: 2014-04-02 | DRG: 641 | Disposition: A | Discharge status: Home / Self Care | Payer: Medicare Other | Attending: Internal Medicine | Admitting: Internal Medicine

## 2014-03-30 DIAGNOSIS — G4700 Insomnia, unspecified: Secondary | ICD-10-CM | POA: Diagnosis present

## 2014-03-30 DIAGNOSIS — E860 Dehydration: Secondary | ICD-10-CM | POA: Diagnosis present

## 2014-03-30 DIAGNOSIS — F1020 Alcohol dependence, uncomplicated: Secondary | ICD-10-CM | POA: Diagnosis present

## 2014-03-30 DIAGNOSIS — Z888 Allergy status to other drugs, medicaments and biological substances status: Secondary | ICD-10-CM

## 2014-03-30 DIAGNOSIS — E871 Hypo-osmolality and hyponatremia: Principal | ICD-10-CM | POA: Diagnosis present

## 2014-03-30 DIAGNOSIS — Z79899 Other long term (current) drug therapy: Secondary | ICD-10-CM

## 2014-03-30 DIAGNOSIS — Z96611 Presence of right artificial shoulder joint: Secondary | ICD-10-CM | POA: Diagnosis present

## 2014-03-30 DIAGNOSIS — K219 Gastro-esophageal reflux disease without esophagitis: Secondary | ICD-10-CM | POA: Diagnosis present

## 2014-03-30 DIAGNOSIS — E872 Acidosis: Secondary | ICD-10-CM | POA: Diagnosis present

## 2014-03-30 DIAGNOSIS — E780 Pure hypercholesterolemia: Secondary | ICD-10-CM | POA: Diagnosis present

## 2014-03-30 DIAGNOSIS — I341 Nonrheumatic mitral (valve) prolapse: Secondary | ICD-10-CM | POA: Diagnosis present

## 2014-03-30 DIAGNOSIS — M13842 Other specified arthritis, left hand: Secondary | ICD-10-CM | POA: Diagnosis present

## 2014-03-30 DIAGNOSIS — F17210 Nicotine dependence, cigarettes, uncomplicated: Secondary | ICD-10-CM | POA: Diagnosis present

## 2014-03-30 DIAGNOSIS — I10 Essential (primary) hypertension: Secondary | ICD-10-CM | POA: Diagnosis present

## 2014-03-30 DIAGNOSIS — I2510 Atherosclerotic heart disease of native coronary artery without angina pectoris: Secondary | ICD-10-CM | POA: Diagnosis present

## 2014-03-30 DIAGNOSIS — F319 Bipolar disorder, unspecified: Secondary | ICD-10-CM | POA: Diagnosis present

## 2014-03-30 DIAGNOSIS — Z96643 Presence of artificial hip joint, bilateral: Secondary | ICD-10-CM | POA: Diagnosis present

## 2014-03-30 DIAGNOSIS — E785 Hyperlipidemia, unspecified: Secondary | ICD-10-CM | POA: Diagnosis present

## 2014-03-30 DIAGNOSIS — R0789 Other chest pain: Secondary | ICD-10-CM

## 2014-03-30 DIAGNOSIS — I426 Alcoholic cardiomyopathy: Secondary | ICD-10-CM | POA: Diagnosis present

## 2014-03-30 DIAGNOSIS — Z7982 Long term (current) use of aspirin: Secondary | ICD-10-CM

## 2014-03-30 DIAGNOSIS — F10220 Alcohol dependence with intoxication, uncomplicated: Secondary | ICD-10-CM | POA: Diagnosis present

## 2014-03-30 DIAGNOSIS — Z885 Allergy status to narcotic agent status: Secondary | ICD-10-CM

## 2014-03-30 DIAGNOSIS — F419 Anxiety disorder, unspecified: Secondary | ICD-10-CM | POA: Diagnosis present

## 2014-03-30 DIAGNOSIS — Z955 Presence of coronary angioplasty implant and graft: Secondary | ICD-10-CM

## 2014-03-30 DIAGNOSIS — F1910 Other psychoactive substance abuse, uncomplicated: Secondary | ICD-10-CM | POA: Diagnosis present

## 2014-03-30 DIAGNOSIS — I5032 Chronic diastolic (congestive) heart failure: Secondary | ICD-10-CM | POA: Diagnosis present

## 2014-03-30 DIAGNOSIS — Z8701 Personal history of pneumonia (recurrent): Secondary | ICD-10-CM

## 2014-03-30 DIAGNOSIS — K2920 Alcoholic gastritis without bleeding: Secondary | ICD-10-CM | POA: Diagnosis present

## 2014-03-30 DIAGNOSIS — Y901 Blood alcohol level of 20-39 mg/100 ml: Secondary | ICD-10-CM | POA: Diagnosis present

## 2014-03-30 DIAGNOSIS — G4089 Other seizures: Secondary | ICD-10-CM | POA: Diagnosis present

## 2014-03-30 DIAGNOSIS — M13841 Other specified arthritis, right hand: Secondary | ICD-10-CM | POA: Diagnosis present

## 2014-03-30 LAB — URINALYSIS, ROUTINE W REFLEX MICROSCOPIC
BILIRUBIN URINE: NEGATIVE
GLUCOSE, UA: NEGATIVE mg/dL
Hgb urine dipstick: NEGATIVE
Ketones, ur: 15 mg/dL — AB
Leukocytes, UA: NEGATIVE
Nitrite: NEGATIVE
PROTEIN: NEGATIVE mg/dL
Specific Gravity, Urine: 1.011 (ref 1.005–1.030)
UROBILINOGEN UA: 0.2 mg/dL (ref 0.0–1.0)
pH: 6 (ref 5.0–8.0)

## 2014-03-30 LAB — COMPREHENSIVE METABOLIC PANEL
ALT: 17 U/L (ref 0–53)
ALT: 19 U/L (ref 0–53)
ANION GAP: 16 — AB (ref 5–15)
ANION GAP: 14 (ref 5–15)
AST: 30 U/L (ref 0–37)
AST: 30 U/L (ref 0–37)
Albumin: 4 g/dL (ref 3.5–5.2)
Albumin: 4.1 g/dL (ref 3.5–5.2)
Alkaline Phosphatase: 91 U/L (ref 39–117)
Alkaline Phosphatase: 92 U/L (ref 39–117)
BILIRUBIN TOTAL: 0.5 mg/dL (ref 0.3–1.2)
BILIRUBIN TOTAL: 0.5 mg/dL (ref 0.3–1.2)
BUN: 7 mg/dL (ref 6–23)
BUN: 8 mg/dL (ref 6–23)
CHLORIDE: 74 mmol/L — AB (ref 96–112)
CHLORIDE: 79 mmol/L — AB (ref 96–112)
CO2: 18 mmol/L — ABNORMAL LOW (ref 19–32)
CO2: 18 mmol/L — AB (ref 19–32)
CREATININE: 0.83 mg/dL (ref 0.50–1.35)
Calcium: 9.1 mg/dL (ref 8.4–10.5)
Calcium: 8.8 mg/dL (ref 8.4–10.5)
Creatinine, Ser: 0.83 mg/dL (ref 0.50–1.35)
GFR calc non Af Amer: >90 mL/min (ref >90)
GFR calc non Af Amer: >90 mL/min (ref >90)
GLUCOSE: 61 mg/dL — AB (ref 70–99)
GLUCOSE: 81 mg/dL (ref 70–99)
POTASSIUM: 4.2 mmol/L (ref 3.5–5.1)
Potassium: 4 mmol/L (ref 3.5–5.1)
Sodium: 108 mmol/L — CL (ref 135–145)
Sodium: 111 mmol/L — CL (ref 135–145)
TOTAL PROTEIN: 7.1 g/dL (ref 6.0–8.3)
Total Protein: 7 g/dL (ref 6.0–8.3)

## 2014-03-30 LAB — CBC
HCT: 37 % — ABNORMAL LOW (ref 39.0–52.0)
HEMATOCRIT: 34 % — AB (ref 39.0–52.0)
Hemoglobin: 11.9 g/dL — ABNORMAL LOW (ref 13.0–17.0)
Hemoglobin: 13.1 g/dL (ref 13.0–17.0)
MCH: 27 pg (ref 26.0–34.0)
MCH: 27.5 pg (ref 26.0–34.0)
MCHC: 35 g/dL (ref 30.0–36.0)
MCHC: 35.4 g/dL (ref 30.0–36.0)
MCV: 77.1 fL — ABNORMAL LOW (ref 78.0–100.0)
MCV: 77.6 fL — AB (ref 78.0–100.0)
Platelets: 320 10*3/uL (ref 150–400)
Platelets: 245 10*3/uL (ref 150–400)
RBC: 4.41 MIL/uL (ref 4.22–5.81)
RBC: 4.77 MIL/uL (ref 4.22–5.81)
RDW: 14.4 % (ref 11.5–15.5)
RDW: 14.5 % (ref 11.5–15.5)
WBC: 7.8 10*3/uL (ref 4.0–10.5)
WBC: 8.2 10*3/uL (ref 4.0–10.5)

## 2014-03-30 LAB — LIPASE, BLOOD: LIPASE: 46 U/L (ref 11–59)

## 2014-03-30 LAB — I-STAT TROPONIN, ED: TROPONIN I, POC: 0.01 ng/mL (ref 0.00–0.08)

## 2014-03-30 LAB — TROPONIN I

## 2014-03-30 LAB — CBG MONITORING, ED: GLUCOSE-CAPILLARY: 90 mg/dL (ref 70–99)

## 2014-03-30 LAB — ETHANOL: Alcohol, Ethyl (B): 34 mg/dL — ABNORMAL HIGH (ref 0–9)

## 2014-03-30 LAB — AMMONIA: AMMONIA: 53 umol/L — AB (ref 11–32)

## 2014-03-30 MED ORDER — THIAMINE HCL 100 MG/ML IJ SOLN: 100.0000 mg | Freq: Every day | INTRAMUSCULAR | Status: DC

## 2014-03-30 MED ORDER — MORPHINE SULFATE 4 MG/ML IJ SOLN
4.0000 mg | Freq: Once | INTRAMUSCULAR | Status: AC
  Administered 2014-03-30: 4 mg via INTRAVENOUS
  Filled 2014-03-30: qty 1

## 2014-03-30 MED ORDER — HEPARIN SODIUM (PORCINE) 5000 UNIT/ML IJ SOLN
5000.0000 [IU] | Freq: Three times a day (TID) | INTRAMUSCULAR | Status: DC
  Administered 2014-03-31 – 2014-04-02 (×8): 5000 [IU] via SUBCUTANEOUS
  Filled 2014-03-30 (×8): qty 1

## 2014-03-30 MED ORDER — LORAZEPAM 2 MG/ML IJ SOLN
1.0000 mg | Freq: Four times a day (QID) | INTRAMUSCULAR | Status: DC | PRN
  Administered 2014-04-02: 1 mg via INTRAVENOUS
  Filled 2014-03-30: qty 1

## 2014-03-30 MED ORDER — CLONIDINE HCL 0.1 MG PO TABS: 0.1000 mg | ORAL_TABLET | Freq: Two times a day (BID) | ORAL | Status: DC | PRN

## 2014-03-30 MED ORDER — ONDANSETRON HCL 4 MG/2ML IJ SOLN: 4.0000 mg | Freq: Four times a day (QID) | INTRAMUSCULAR | Status: DC | PRN

## 2014-03-30 MED ORDER — FOLIC ACID 1 MG PO TABS
1.0000 mg | ORAL_TABLET | Freq: Every day | ORAL | Status: DC
  Administered 2014-03-31 – 2014-04-02 (×3): 1 mg via ORAL
  Filled 2014-03-30 (×3): qty 1

## 2014-03-30 MED ORDER — FENTANYL CITRATE 0.05 MG/ML IJ SOLN
50.0000 ug | Freq: Once | INTRAMUSCULAR | Status: AC
  Administered 2014-03-30: 50 ug via INTRAVENOUS
  Filled 2014-03-30: qty 2

## 2014-03-30 MED ORDER — VITAMIN B-1 100 MG PO TABS
100.0000 mg | ORAL_TABLET | Freq: Every day | ORAL | Status: DC
  Administered 2014-03-31 – 2014-04-02 (×3): 100 mg via ORAL
  Filled 2014-03-30 (×3): qty 1

## 2014-03-30 MED ORDER — BUPROPION HCL ER (XL) 300 MG PO TB24
300.0000 mg | ORAL_TABLET | Freq: Every day | ORAL | Status: DC
  Administered 2014-03-31 – 2014-04-02 (×3): 300 mg via ORAL
  Filled 2014-03-30 (×3): qty 1

## 2014-03-30 MED ORDER — ONDANSETRON HCL 4 MG PO TABS
4.0000 mg | ORAL_TABLET | Freq: Four times a day (QID) | ORAL | Status: DC | PRN
  Administered 2014-03-31: 4 mg via ORAL
  Filled 2014-03-30: qty 1

## 2014-03-30 MED ORDER — NITROGLYCERIN 0.4 MG SL SUBL
0.4000 mg | SUBLINGUAL_TABLET | SUBLINGUAL | Status: DC | PRN
  Administered 2014-03-30 (×3): 0.4 mg via SUBLINGUAL
  Filled 2014-03-30: qty 1

## 2014-03-30 MED ORDER — PANTOPRAZOLE SODIUM 40 MG PO TBEC
40.0000 mg | DELAYED_RELEASE_TABLET | Freq: Every day | ORAL | Status: DC
  Administered 2014-03-31 – 2014-04-02 (×3): 40 mg via ORAL
  Filled 2014-03-30 (×3): qty 1

## 2014-03-30 MED ORDER — SODIUM CHLORIDE 0.9 % IV BOLUS (SEPSIS)
1000.0000 mL | Freq: Once | INTRAVENOUS | Status: AC
  Administered 2014-03-30: 1000 mL via INTRAVENOUS

## 2014-03-30 MED ORDER — ASPIRIN EC 325 MG PO TBEC
325.0000 mg | DELAYED_RELEASE_TABLET | Freq: Every morning | ORAL | Status: DC
  Administered 2014-03-31 – 2014-04-02 (×3): 325 mg via ORAL
  Filled 2014-03-30 (×3): qty 1

## 2014-03-30 MED ORDER — ONDANSETRON HCL 4 MG/2ML IJ SOLN
4.0000 mg | Freq: Once | INTRAMUSCULAR | Status: AC
  Administered 2014-03-30: 4 mg via INTRAVENOUS
  Filled 2014-03-30: qty 2

## 2014-03-30 MED ORDER — ADULT MULTIVITAMIN W/MINERALS CH
1.0000 | ORAL_TABLET | Freq: Every day | ORAL | Status: DC
  Administered 2014-03-31 – 2014-04-02 (×3): 1 via ORAL
  Filled 2014-03-30 (×3): qty 1

## 2014-03-30 MED ORDER — LORAZEPAM 1 MG PO TABS
1.0000 mg | ORAL_TABLET | Freq: Once | ORAL | Status: AC
  Administered 2014-03-30: 1 mg via ORAL
  Filled 2014-03-30: qty 1

## 2014-03-30 MED ORDER — SODIUM CHLORIDE 0.9 % IV SOLN
INTRAVENOUS | Status: DC
  Administered 2014-03-31: via INTRAVENOUS

## 2014-03-30 MED ORDER — QUETIAPINE FUMARATE ER 200 MG PO TB24
200.0000 mg | ORAL_TABLET | Freq: Every day | ORAL | Status: DC
  Administered 2014-03-31 – 2014-04-01 (×3): 200 mg via ORAL
  Filled 2014-03-30 (×3): qty 1
  Filled 2014-03-30: qty 4

## 2014-03-30 MED ORDER — THIAMINE HCL 100 MG/ML IJ SOLN
Freq: Once | INTRAVENOUS | Status: AC
  Administered 2014-03-30: 23:00:00 via INTRAVENOUS
  Filled 2014-03-30: qty 1000

## 2014-03-30 MED ORDER — LORAZEPAM 1 MG PO TABS
1.0000 mg | ORAL_TABLET | Freq: Four times a day (QID) | ORAL | Status: DC | PRN
  Administered 2014-03-31 – 2014-04-01 (×4): 1 mg via ORAL
  Filled 2014-03-30 (×4): qty 1

## 2014-03-30 MED ORDER — METOPROLOL SUCCINATE ER 25 MG PO TB24
25.0000 mg | ORAL_TABLET | Freq: Every day | ORAL | Status: DC
  Administered 2014-03-31: 25 mg via ORAL
  Filled 2014-03-30 (×3): qty 1

## 2014-03-30 NOTE — ED Provider Notes (Signed)
CSN: [id number]     Arrival date & time 03/30/14  1926 History   First MD Initiated Contact with Patient 03/30/14 1938     Chief Complaint  Patient presents with  . Chest Pain  . Abdominal Pain     (Consider location/radiation/quality/duration/timing/severity/associated sxs/prior Treatment) HPI  64 year old male presents with abdominal pain, chest pain, and headache. The symptoms been going on for the past 3 days. They're intermittent. Currently has a mild pressure in his head but is mostly complaining of diffuse abdominal pain. He states that for the past 10 days he's been drinking about 10-12 beers per day. He states that the recent loss of his brother a few months ago is finally hitting him. He denies suicidal or homicidal thoughts, just this is how he is relieving his depression. The patient denies fevers. He has had a vomiting. Now he is vomiting only phlegm. Denies any diarrhea, last bowel movement was 4 days ago.  Past Medical History  Diagnosis Date  . MVP (mitral valve prolapse)   . Anxiety   . Hypertension   . Hyperlipidemia   . Palpitations   . Bipolar disorder   . Depression   . Esophagitis   . Gastritis   . Insomnia   . Hypercholesteremia   . Coronary artery disease     a.  s/p PCI to RCA in 2007;  b.  Last LHC 04/2008:  LM with irregularities, LAD 50%, mid LAD 50%, proximal D1 80-90%, proximal OM1 30-40%, proximal OM2 50%, PLB branch 30-40%, site of prior angioplasty in the RCA widely patent, EF 65%=>Med Rx;  c. Nuclear study a 12/2011: EF 71% and normal perfusion.    Marland Kitchen History of echocardiogram     Echo 08/16/09: Mild LVH, EF 55-60%, no normal wall motion, grade 1 diastolic dysfunction  . Anginal pain   . Pneumonia 2005  . GERD (gastroesophageal reflux disease)   . Arthritis     "hands; especially in cold weather" (12/11/2013)  . 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 Coronary angioplasty  2007    RCA   . Cardiac catheterization  04/2008    EF 65%  . Knee arthroscopy, medial patello femoral ligament repair    . Hip arthroplasty Bilateral     "both replaced twice"   . Total shoulder replacement Right   . Total hip revision  02/07/2012    Procedure: TOTAL HIP REVISION;  Surgeon: [name], MD;  Location: [facility] OR;  Service: Orthopedics;  Laterality: Left;  [place] Hip Revision Set  . Total knee arthroplasty Right   . Appendectomy  1958  . Tonsillectomy  1958  . Joint replacement     Family History  Problem Relation Age of Onset  . Heart disease Neg Hx   . Prostate cancer Father    History  Substance Use Topics  . Smoking status: Current Some Day Smoker -- 0.50 packs/day for 30 years    Types: Cigarettes  . Smokeless tobacco: Never Used  . Alcohol Use: 0.0 oz/week     Comment: 12/11/2013 "maybe 2-3 beers or a couple mixed drinks q couple weeks"    Review of Systems  Constitutional: Negative for fever.  Respiratory: Positive for shortness of breath.   Cardiovascular: Positive for chest pain.  Gastrointestinal: Positive for nausea, vomiting, abdominal pain and constipation.  Genitourinary: Negative for dysuria.  Neurological: Positive for weakness (generalized) and headaches.  All other systems reviewed and are negative.     Allergies  Ambien and Codeine  Home Medications   Prior to Admission medications   Medication Sig Start Date End Date Taking? Authorizing Provider  aspirin EC 325 MG tablet Take 1 tablet (325 mg total) by mouth every morning. For blood thinner for heart health 08/15/13   Charm Rings, NP  atorvastatin (LIPITOR) 40 MG tablet Take 1 tablet (40 mg total) by mouth daily. For cholesterol 10/24/12   Sanjuana Kava, NP  azithromycin (ZITHROMAX) 500 MG tablet Take 1 tablet (500 mg total) by mouth daily. Patient not taking: Reported on 03/03/2014 12/13/13   Leroy Sea, MD  bismuth subsalicylate (PEPTO BISMOL) 262 MG/15ML suspension Take 30 mLs by mouth  every 6 (six) hours as needed for indigestion (indigestion).    Historical Provider, MD  buPROPion (WELLBUTRIN XL) 300 MG 24 hr tablet Take 1 tablet (300 mg total) by mouth daily. For depression 10/24/12   Sanjuana Kava, NP  cloNIDine (CATAPRES) 0.1 MG tablet Take 1 tablet (0.1 mg total) by mouth 2 (two) times daily. 08/15/13   Charm Rings, NP  cyclobenzaprine (FLEXERIL) 5 MG tablet Take 1 tablet (5 mg total) by mouth 3 (three) times daily as needed for muscle spasms. 03/03/14   Linwood Dibbles, MD  folic acid (FOLVITE) 1 MG tablet Take 1 tablet (1 mg total) by mouth daily. Patient not taking: Reported on 03/03/2014 12/13/13   Leroy Sea, MD  hydrOXYzine (ATARAX/VISTARIL) 25 MG tablet Take 1 tablet (25 mg total) by mouth every 6 (six) hours as needed for anxiety. 10/24/12   Sanjuana Kava, NP  metoprolol succinate (TOPROL-XL) 25 MG 24 hr tablet Take 1 tablet (25 mg total) by mouth daily. 08/15/13   Charm Rings, NP  nitroGLYCERIN (NITROSTAT) 0.4 MG SL tablet Place 1 tablet (0.4 mg total) under the tongue every 5 (five) minutes as needed for chest pain (CP or SOB). 10/24/12   Sanjuana Kava, NP  oxyCODONE-acetaminophen (PERCOCET/ROXICET) 5-325 MG per tablet Take 1-2 tablets by mouth every 6 (six) hours as needed. 03/03/14   Linwood Dibbles, MD  pantoprazole (PROTONIX) 40 MG tablet Take 1 tablet (40 mg total) by mouth daily. 08/15/13   Charm Rings, NP  Pseudoephedrine-APAP-DM (DAYQUIL PO) Take 2 capsules by mouth at bedtime as needed (sleep).    Historical Provider, MD  QUEtiapine (SEROQUEL XR) 50 MG TB24 24 hr tablet Take 5 tablets (250 mg total) by mouth at bedtime. Patient taking differently: Take 50-200 mg by mouth at bedtime. Take 1 Tablet (50 mg) in the am and Take 4 Tablets (200 mg) in the pm 08/15/13   Charm Rings, NP  thiamine (VITAMIN B-1) 100 MG tablet Take 1 tablet (100 mg total) by mouth daily. Patient not taking: Reported on 03/03/2014 12/13/13   Leroy Sea, MD   BP 163/102 mmHg  Pulse 90   Temp(Src) 98.3 F (36.8 C) (Oral)  Resp 18  Ht 5\' 10"  (1.778 m)  Wt 195 lb (88.451 kg)  BMI 27.98 kg/m2  SpO2 100% Physical Exam  Constitutional: He is oriented to person, place, and time. He appears well-developed and well-nourished.  HENT:  Head: Normocephalic and atraumatic.  Right Ear: External ear normal.  Left Ear: External ear normal.  Nose: Nose normal.  Eyes: EOM are normal. Pupils are equal, round, and reactive to light. Right eye exhibits no discharge. Left eye exhibits no discharge.  Neck: Neck supple.  Cardiovascular: Normal rate, regular rhythm, normal heart sounds and intact distal pulses.   Pulmonary/Chest: Effort normal and breath sounds normal. He has no wheezes.  Abdominal: Soft. There is generalized tenderness.  Musculoskeletal: He exhibits no edema.  Neurological: He is alert and oriented to person, place, and time.  CN 2-12 grossly intact. 5/5 strength in all 4 extremities.  Skin: Skin is warm and dry.  Nursing note and vitals reviewed.   ED Course  Procedures (including critical care time) Labs Review Labs Reviewed  CBC - Abnormal; Notable for the following:    Hemoglobin 11.9 (*)    HCT 34.0 (*)    MCV 77.1 (*)    All other components within normal limits  COMPREHENSIVE METABOLIC PANEL - Abnormal; Notable for the following:    Sodium 108 (*)    Chloride 74 (*)    CO2 18 (*)    Glucose, Bld 61 (*)    Anion gap 16 (*)    All other components within normal limits  ETHANOL - Abnormal; Notable for the following:    Alcohol, Ethyl (B) 34 (*)    All other components within normal limits  TROPONIN I  LIPASE, BLOOD    Imaging Review Ct Head Wo Contrast  03/30/2014   CLINICAL DATA:  Headache beginning today.  EXAM: CT HEAD WITHOUT CONTRAST  TECHNIQUE: Contiguous axial images were obtained from the base of the skull through the vertex without intravenous contrast.  COMPARISON:  Head CT scan 12/10/2013.  FINDINGS: There is some cortical atrophy and  chronic microvascular ischemic change. No evidence of acute intracranial abnormality including hemorrhage, infarct, mass lesion, mass effect, midline shift or abnormal extra-axial fluid collection is seen. Atherosclerosis is noted. Mild mucosal thickening is seen in the maxillary sinuses bilaterally. The right maxillary sinus is hypoplastic. Mucous retention cyst or polyp in the right frontal sinus is unchanged. Remote nasal bone fractures are noted.  IMPRESSION: No acute abnormality.  Atrophy and chronic microvascular ischemic change.  Atherosclerosis.  Mild mucosal thickening maxillary sinuses bilaterally.   Electronically Signed   By: Drusilla Kannerhomas  Dalessio M.D.   On: 03/30/2014 20:56   Dg Chest Port 1 View  03/30/2014   CLINICAL DATA:  Chest and abdomen pain starting yesterday  EXAM: PORTABLE CHEST - 1 VIEW  COMPARISON:  March 03, 2014  FINDINGS: The heart size and mediastinal contours are stable. The aorta is tortuous. The heart size is enlarged. There is no focal infiltrate, pulmonary edema, or pleural effusion. The visualized skeletal structures are stable.  IMPRESSION: No active cardiopulmonary disease.  Cardiomegaly.   Electronically Signed   By: Sherian ReinWei-Chen  Lin M.D.   On: 03/30/2014 20:25     EKG Interpretation   Date/Time:  Monday March 30 2014 19:37:01 EST Ventricular Rate:  90 PR Interval:  261 QRS Duration: 118 QT Interval:  379 QTC Calculation: 464 R Axis:   171 Text Interpretation:  Sinus rhythm Prolonged PR interval Left posterior  fascicular block Inferior infarct, old No significant change since last  tracing Confirmed by Berdena Cisek  MD, Denis Koppel ([id number]) on 03/30/2014 8:08:40 PM      MDM   Final diagnoses:  Hyponatremia    Patient's lab significant for hyponatremia with a level of 108. This is new for patient. Likely related to his increased alcohol intake as well as vomiting. He is at this time neurologically intact besides global weakness but no seizure activity. He is not altered.  He was given 1 L bolus prior to this, will  also give banana bag. At this point he has no signs of ACS, but will need admission for his hyponatremia for monitoring and repeat. Discussed with the hospitalist, Dr. Alvester Morin, who will admit the patient.    Pricilla Loveless, MD 03/30/14 2135

## 2014-03-30 NOTE — ED Notes (Signed)
Per EMS, the patient reports having mild intermittent chest pressure that started 3 days ago. Today he started experiencing stomach pain, nausea, and headache. Consumed more ETOH than usual due to death of brother recently. VS with EMS: BP 166/114, p 91, RR 16, o2 sat 97% on room air. Patient received 324mg  of aspirin en route.

## 2014-03-30 NOTE — ED Notes (Signed)
Reported Na level to Dr. Criss AlvineGoldston. No new orders at this time.

## 2014-03-30 NOTE — ED Notes (Signed)
Na+ reading of 108 called in from main lab.

## 2014-03-30 NOTE — ED Notes (Signed)
ekg given to Dr. Criss AlvineGoldston.

## 2014-03-30 NOTE — ED Notes (Signed)
Patient given meal, explained need to eat, blood glucose reading of 61. Dr. Criss AlvineGoldston aware.

## 2014-03-30 NOTE — ED Notes (Signed)
Bed changed due to active chest pain.

## 2014-03-30 NOTE — ED Notes (Signed)
Dr. Alvester MorinNewton paged

## 2014-03-30 NOTE — ED Notes (Signed)
Portable chest x-ray came to bedside.

## 2014-03-30 NOTE — ED Notes (Signed)
Attempted to call report

## 2014-03-30 NOTE — ED Notes (Signed)
cbg is 90 

## 2014-03-30 NOTE — ED Notes (Signed)
Na level 111 called in from main lab.

## 2014-03-30 NOTE — ED Notes (Signed)
Called main lab and spoke to shelia, she will add on lipase to current blood samples.

## 2014-03-30 NOTE — ED Notes (Signed)
Called Dr. Alvester MorinNewton to report chest pain. MD acknowledges, gives verbal order for EKG, morphine, and nitro for management. Also verbal order for troponin.

## 2014-03-30 NOTE — H&P (Signed)
Hospitalist Admission History and Physical  Patient name: [name] Medical record number: [id number] Date of birth: July 31, 1950 Age: 64 y.o. Gender: male  Primary Care Provider: Thayer Headings, MD  Chief Complaint: alcohol abuse, hyponatremia   History of Present Illness:This is a 64 y.o. year old male with significant past medical history of alcohol dependence, CAD, grade 1 diastolic dysfunction, bipolar disorder, alcohol withdrawal assd seizures  presenting with alcohol abuse, hyponatremia. Pt states that he has been on a 10 day drinking binge, drinking more than 10 beers per day. Pt reports a longstanding history of ETOH dependence since about being 64 YO. States that his brother died recently and this is what he has done to cope with this. Has not been eating any food or liquids other than alcohol per pt. No fevers, chills, abd pain. Dry heaves at home. Denies any hx/o GIB. No diarrhea/melanotic stools.  Presents to the ER afebrile, heart rate in the 80s to 90s, respirations in the tens to 20s, blood pressure in the 150s to 160s over 100s. Satting 98% on room air. White blood cell count 7.8, hemoglobin 11.9, creatinine 0.83, sodium 108, bicarbonate 18. Glucose 61. LFTs WNL. ETOH 34. Lipase 46.   Assessment and Plan: [name] is a 64 y.o. year old male presenting with alcohol abuse, hyponatremia    Active Problems:   Hyponatremia   1-Hyponatremia -likely secondary to beer potomania  -baseline Na appears to be in 120s-130s chronically  -euvolemic to mildly dry on exam -no LE edema, though w/ noted cardiomegaly on CXR -BNP to correlate -urine, serum osm -NS @ 50 ccs/hr in the interim -goal: 8-10 mEq change over next 12-24 hours  -1L free fluid restriction  -serial electrolytes  2-Metabolic acidosis -likely secondary to ETOH abuse -check lactate -gentle hydration  -follow  3- ETOH abuse -thiamine, folate, multivitamin -check ammonia -UDS -CIWA protocol  -pt  considering formal rehabilation services   4-Diastolic dysfunction -prior reading of grade 1 diastolic dysfunction in review of history  -noted normal myoview 12/2011 -euvolemic to mildly dry on exam though with cardiomegaly on imaging -BNP to correlate -2D ECHO   5- HTN -elevated BP  -resume home meds -follow  6-Psych  -stable  -cont home regimen    FEN/GI: heart healthy diet, 1L free water restriction. PPI Prophylaxis: sub q heparin  Disposition: pending further evaluation  Code Status:Full Code    Patient Active Problem List   Diagnosis Date Noted  . Fall 12/10/2013  . CAP (community acquired pneumonia) 12/10/2013  . Chest pain 09/19/2012  . Sinus tachycardia 09/19/2012  . Anxiety 09/19/2012  . Bipolar disorder 09/19/2012  . Depression 09/19/2012  . Hyponatremia 09/19/2012  . Anemia 03/22/2012  . Alcohol dependence 03/21/2012    Class: Chronic  . Chest pain at rest 03/20/2012  . Precordial pain 03/20/2012  . Acute blood loss anemia 02/12/2012  . Pain due to hip joint prosthesis, recalled Depuy ASR 02/07/2012  . Polysubstance abuse 09/02/2011  . Broken internal left hip prosthesis 09/02/2011  . CAD (coronary artery disease) 09/27/2010  . HTN (hypertension) 09/27/2010  . Hyperlipidemia 09/27/2010   Past Medical History: Past Medical History  Diagnosis Date  . MVP (mitral valve prolapse)   . Anxiety   . Hypertension   . Hyperlipidemia   . Palpitations   . Bipolar disorder   . Depression   . Esophagitis   . Gastritis   . Insomnia   . Hypercholesteremia   . Coronary artery disease  a.  s/p PCI to RCA in 2007;  b.  Last LHC 04/2008:  LM with irregularities, LAD 50%, mid LAD 50%, proximal D1 80-90%, proximal OM1 30-40%, proximal OM2 50%, PLB branch 30-40%, site of prior angioplasty in the RCA widely patent, EF 65%=>Med Rx;  c. Nuclear study a 12/2011: EF 71% and normal perfusion.    Marland Kitchen History of echocardiogram     Echo 08/16/09: Mild LVH, EF 55-60%, no  normal wall motion, grade 1 diastolic dysfunction  . Anginal pain   . Pneumonia 2005  . GERD (gastroesophageal reflux disease)   . Arthritis     "hands; especially in cold weather" (12/11/2013)  . Alcohol related seizure     "when I go off it"    Past Surgical History: Past Surgical History  Procedure Laterality Date  . Coronary angioplasty  2007    RCA  . Cardiac catheterization  04/2008    EF 65%  . Knee arthroscopy, medial patello femoral ligament repair    . Hip arthroplasty Bilateral     "both replaced twice"   . Total shoulder replacement Right   . Total hip revision  02/07/2012    Procedure: TOTAL HIP REVISION;  Surgeon: [name], MD;  Location: [facility] OR;  Service: Orthopedics;  Laterality: Left;  [place] Hip Revision Set  . Total knee arthroplasty Right   . Appendectomy  1958  . Tonsillectomy  1958  . Joint replacement      Social History: History   Social History  . Marital Status: Single    Spouse Name: N/A  . Number of Children: N/A  . Years of Education: N/A   Social History Main Topics  . Smoking status: Current Some Day Smoker -- 0.50 packs/day for 30 years    Types: Cigarettes  . Smokeless tobacco: Never Used  . Alcohol Use: 0.0 oz/week     Comment: 12/11/2013 "maybe 2-3 beers or a couple mixed drinks q couple weeks"  . Drug Use: 7.00 per week    Special: Benzodiazepines     Comment: "stopped ~ 2005"  . Sexual Activity: No   Other Topics Concern  . None   Social History Narrative    Family History: Family History  Problem Relation Age of Onset  . Heart disease Neg Hx   . Prostate cancer Father     Allergies: Allergies  Allergen Reactions  . Ambien [Zolpidem Tartrate] Other (See Comments)    Sleep Walking  . Codeine Itching    Current Facility-Administered Medications  Medication Dose Route Frequency Provider Last Rate Last Dose  . 0.9 %  sodium chloride infusion   Intravenous Continuous Floydene Flock, MD      . Melene Muller ON  03/31/2014] aspirin EC tablet 325 mg  325 mg Oral q morning - 10a Floydene Flock, MD      . buPROPion (WELLBUTRIN XL) 24 hr tablet 300 mg  300 mg Oral Daily Floydene Flock, MD      . cloNIDine (CATAPRES) tablet 0.1 mg  0.1 mg Oral BID PRN Floydene Flock, MD      . folic acid (FOLVITE) tablet 1 mg  1 mg Oral Daily Floydene Flock, MD      . heparin injection 5,000 Units  5,000 Units Subcutaneous 3 times per day Floydene Flock, MD      . LORazepam (ATIVAN) tablet 1 mg  1 mg Oral Q6H PRN Floydene Flock, MD       Or  .  LORazepam (ATIVAN) injection 1 mg  1 mg Intravenous Q6H PRN Floydene Flock, MD      . LORazepam (ATIVAN) tablet 1 mg  1 mg Oral Once Pricilla Loveless, MD      . metoprolol succinate (TOPROL-XL) 24 hr tablet 25 mg  25 mg Oral Daily Floydene Flock, MD      . multivitamin with minerals tablet 1 tablet  1 tablet Oral Daily Floydene Flock, MD      . ondansetron [facility]) tablet 4 mg  4 mg Oral Q6H PRN Floydene Flock, MD       Or  . ondansetron [facility]) injection 4 mg  4 mg Intravenous Q6H PRN Floydene Flock, MD      . QUEtiapine (SEROQUEL XR) 24 hr tablet 50-200 mg  50-200 mg Oral QHS Floydene Flock, MD      . sodium chloride 0.9 % 1,000 mL with thiamine 100 mg, folic acid 1 mg, multivitamins adult 10 mL infusion   Intravenous Once Pricilla Loveless, MD      . thiamine (VITAMIN B-1) tablet 100 mg  100 mg Oral Daily Floydene Flock, MD       Or  . thiamine (B-1) injection 100 mg  100 mg Intravenous Daily Floydene Flock, MD       Current Outpatient Prescriptions  Medication Sig Dispense Refill  . aspirin EC 325 MG tablet Take 1 tablet (325 mg total) by mouth every morning. For blood thinner for heart health 30 tablet 0  . atorvastatin (LIPITOR) 40 MG tablet Take 1 tablet (40 mg total) by mouth daily. For cholesterol 30 tablet 0  . buPROPion (WELLBUTRIN XL) 300 MG 24 hr tablet Take 1 tablet (300 mg total) by mouth daily. For depression 30 tablet 0  . cloNIDine (CATAPRES) 0.1 MG tablet  Take 1 tablet (0.1 mg total) by mouth 2 (two) times daily. (Patient taking differently: Take 0.1 mg by mouth 2 (two) times daily as needed (blood pressure). ) 60 tablet 11  . cyclobenzaprine (FLEXERIL) 5 MG tablet Take 1 tablet (5 mg total) by mouth 3 (three) times daily as needed for muscle spasms. 21 tablet 0  . hydrOXYzine (ATARAX/VISTARIL) 25 MG tablet Take 1 tablet (25 mg total) by mouth every 6 (six) hours as needed for anxiety. 60 tablet 0  . metoprolol succinate (TOPROL-XL) 25 MG 24 hr tablet Take 1 tablet (25 mg total) by mouth daily. 30 tablet 0  . oxyCODONE-acetaminophen (PERCOCET/ROXICET) 5-325 MG per tablet Take 1-2 tablets by mouth every 6 (six) hours as needed. 20 tablet 0  . Pseudoephedrine-APAP-DM (DAYQUIL PO) Take 2 capsules by mouth at bedtime as needed (sleep).    . QUEtiapine (SEROQUEL XR) 50 MG TB24 24 hr tablet Take 5 tablets (250 mg total) by mouth at bedtime. (Patient taking differently: Take 50-200 mg by mouth at bedtime. Take 1 Tablet (50 mg) in the am and Take 4 Tablets (200 mg) in the pm) 150 each 0  . folic acid (FOLVITE) 1 MG tablet Take 1 tablet (1 mg total) by mouth daily. (Patient not taking: Reported on 03/03/2014) 30 tablet 0  . nitroGLYCERIN (NITROSTAT) 0.4 MG SL tablet Place 1 tablet (0.4 mg total) under the tongue every 5 (five) minutes as needed for chest pain (CP or SOB). 15 tablet 0   Review Of Systems: 12 point ROS negative except as noted above in HPI.  Physical Exam: Filed Vitals:   03/30/14 2115  BP: 168/96  Pulse: 94  Temp:   Resp: 17    General: alert and cooperative HEENT: PERRLA and extra ocular movement intact Heart: S1, S2 normal, no murmur, rub or gallop, regular rate and rhythm Lungs: clear to auscultation, no wheezes or rales and unlabored breathing Abdomen: distended abd, + bowel sounds, non tender  Extremities: extremities normal, atraumatic, no cyanosis or edema Skin:no rashes Neurology: normal without focal findings  Labs and  Imaging: Lab Results  Component Value Date/Time   NA 108* 03/30/2014 08:03 PM   K 4.2 03/30/2014 08:03 PM   CL 74* 03/30/2014 08:03 PM   CO2 18* 03/30/2014 08:03 PM   BUN 7 03/30/2014 08:03 PM   CREATININE 0.83 03/30/2014 08:03 PM   GLUCOSE 61* 03/30/2014 08:03 PM   Lab Results  Component Value Date   WBC 7.8 03/30/2014   HGB 11.9* 03/30/2014   HCT 34.0* 03/30/2014   MCV 77.1* 03/30/2014   PLT 320 03/30/2014    Ct Head Wo Contrast  03/30/2014   CLINICAL DATA:  Headache beginning today.  EXAM: CT HEAD WITHOUT CONTRAST  TECHNIQUE: Contiguous axial images were obtained from the base of the skull through the vertex without intravenous contrast.  COMPARISON:  Head CT scan 12/10/2013.  FINDINGS: There is some cortical atrophy and chronic microvascular ischemic change. No evidence of acute intracranial abnormality including hemorrhage, infarct, mass lesion, mass effect, midline shift or abnormal extra-axial fluid collection is seen. Atherosclerosis is noted. Mild mucosal thickening is seen in the maxillary sinuses bilaterally. The right maxillary sinus is hypoplastic. Mucous retention cyst or polyp in the right frontal sinus is unchanged. Remote nasal bone fractures are noted.  IMPRESSION: No acute abnormality.  Atrophy and chronic microvascular ischemic change.  Atherosclerosis.  Mild mucosal thickening maxillary sinuses bilaterally.   Electronically Signed   By: Drusilla Kannerhomas  Dalessio M.D.   On: 03/30/2014 20:56   Dg Chest Port 1 View  03/30/2014   CLINICAL DATA:  Chest and abdomen pain starting yesterday  EXAM: PORTABLE CHEST - 1 VIEW  COMPARISON:  March 03, 2014  FINDINGS: The heart size and mediastinal contours are stable. The aorta is tortuous. The heart size is enlarged. There is no focal infiltrate, pulmonary edema, or pleural effusion. The visualized skeletal structures are stable.  IMPRESSION: No active cardiopulmonary disease.  Cardiomegaly.   Electronically Signed   By: Sherian ReinWei-Chen  Lin M.D.    On: 03/30/2014 20:25           Doree AlbeeSteven Jamiah Homeyer MD  Pager: [phone]

## 2014-03-31 DIAGNOSIS — G4089 Other seizures: Secondary | ICD-10-CM | POA: Diagnosis present

## 2014-03-31 DIAGNOSIS — E860 Dehydration: Secondary | ICD-10-CM | POA: Diagnosis present

## 2014-03-31 DIAGNOSIS — Z885 Allergy status to narcotic agent status: Secondary | ICD-10-CM | POA: Diagnosis not present

## 2014-03-31 DIAGNOSIS — F17210 Nicotine dependence, cigarettes, uncomplicated: Secondary | ICD-10-CM | POA: Diagnosis present

## 2014-03-31 DIAGNOSIS — Z96611 Presence of right artificial shoulder joint: Secondary | ICD-10-CM | POA: Diagnosis present

## 2014-03-31 DIAGNOSIS — I2510 Atherosclerotic heart disease of native coronary artery without angina pectoris: Secondary | ICD-10-CM | POA: Diagnosis present

## 2014-03-31 DIAGNOSIS — I426 Alcoholic cardiomyopathy: Secondary | ICD-10-CM | POA: Diagnosis present

## 2014-03-31 DIAGNOSIS — E871 Hypo-osmolality and hyponatremia: Secondary | ICD-10-CM | POA: Diagnosis present

## 2014-03-31 DIAGNOSIS — M13841 Other specified arthritis, right hand: Secondary | ICD-10-CM | POA: Diagnosis present

## 2014-03-31 DIAGNOSIS — F10220 Alcohol dependence with intoxication, uncomplicated: Secondary | ICD-10-CM | POA: Diagnosis present

## 2014-03-31 DIAGNOSIS — F1910 Other psychoactive substance abuse, uncomplicated: Secondary | ICD-10-CM

## 2014-03-31 DIAGNOSIS — Z7982 Long term (current) use of aspirin: Secondary | ICD-10-CM | POA: Diagnosis not present

## 2014-03-31 DIAGNOSIS — F10230 Alcohol dependence with withdrawal, uncomplicated: Secondary | ICD-10-CM

## 2014-03-31 DIAGNOSIS — M13842 Other specified arthritis, left hand: Secondary | ICD-10-CM | POA: Diagnosis present

## 2014-03-31 DIAGNOSIS — F319 Bipolar disorder, unspecified: Secondary | ICD-10-CM | POA: Diagnosis present

## 2014-03-31 DIAGNOSIS — Z955 Presence of coronary angioplasty implant and graft: Secondary | ICD-10-CM | POA: Diagnosis not present

## 2014-03-31 DIAGNOSIS — I10 Essential (primary) hypertension: Secondary | ICD-10-CM | POA: Diagnosis present

## 2014-03-31 DIAGNOSIS — E785 Hyperlipidemia, unspecified: Secondary | ICD-10-CM | POA: Diagnosis present

## 2014-03-31 DIAGNOSIS — I341 Nonrheumatic mitral (valve) prolapse: Secondary | ICD-10-CM | POA: Diagnosis present

## 2014-03-31 DIAGNOSIS — E780 Pure hypercholesterolemia: Secondary | ICD-10-CM | POA: Diagnosis present

## 2014-03-31 DIAGNOSIS — Z96643 Presence of artificial hip joint, bilateral: Secondary | ICD-10-CM | POA: Diagnosis present

## 2014-03-31 DIAGNOSIS — I5032 Chronic diastolic (congestive) heart failure: Secondary | ICD-10-CM | POA: Diagnosis present

## 2014-03-31 DIAGNOSIS — G4700 Insomnia, unspecified: Secondary | ICD-10-CM | POA: Diagnosis present

## 2014-03-31 DIAGNOSIS — F419 Anxiety disorder, unspecified: Secondary | ICD-10-CM | POA: Diagnosis present

## 2014-03-31 DIAGNOSIS — Z8701 Personal history of pneumonia (recurrent): Secondary | ICD-10-CM | POA: Diagnosis not present

## 2014-03-31 DIAGNOSIS — Z79899 Other long term (current) drug therapy: Secondary | ICD-10-CM | POA: Diagnosis not present

## 2014-03-31 DIAGNOSIS — Y901 Blood alcohol level of 20-39 mg/100 ml: Secondary | ICD-10-CM | POA: Diagnosis present

## 2014-03-31 DIAGNOSIS — E872 Acidosis: Secondary | ICD-10-CM | POA: Diagnosis present

## 2014-03-31 DIAGNOSIS — K2920 Alcoholic gastritis without bleeding: Secondary | ICD-10-CM | POA: Diagnosis present

## 2014-03-31 DIAGNOSIS — K219 Gastro-esophageal reflux disease without esophagitis: Secondary | ICD-10-CM | POA: Diagnosis present

## 2014-03-31 DIAGNOSIS — R0600 Dyspnea, unspecified: Secondary | ICD-10-CM

## 2014-03-31 DIAGNOSIS — Z888 Allergy status to other drugs, medicaments and biological substances status: Secondary | ICD-10-CM | POA: Diagnosis not present

## 2014-03-31 LAB — COMPREHENSIVE METABOLIC PANEL
ALK PHOS: 73 U/L (ref 39–117)
ALT: 17 U/L (ref 0–53)
ALT: 19 U/L (ref 0–53)
ALT: 21 U/L (ref 0–53)
ANION GAP: 9 (ref 5–15)
AST: 27 U/L (ref 0–37)
AST: 30 U/L (ref 0–37)
AST: 32 U/L (ref 0–37)
Albumin: 3.5 g/dL (ref 3.5–5.2)
Albumin: 3.3 g/dL — ABNORMAL LOW (ref 3.5–5.2)
Albumin: 3.5 g/dL (ref 3.5–5.2)
Alkaline Phosphatase: 74 U/L (ref 39–117)
Alkaline Phosphatase: 76 U/L (ref 39–117)
Anion gap: 10 (ref 5–15)
Anion gap: 11 (ref 5–15)
BUN: 9 mg/dL (ref 6–23)
BUN: 10 mg/dL (ref 6–23)
BUN: 10 mg/dL (ref 6–23)
CALCIUM: 9.2 mg/dL (ref 8.4–10.5)
CO2: 20 mmol/L (ref 19–32)
CO2: 21 mmol/L (ref 19–32)
CO2: 22 mmol/L (ref 19–32)
CREATININE: 0.89 mg/dL (ref 0.50–1.35)
Calcium: 8.6 mg/dL (ref 8.4–10.5)
Calcium: 8.8 mg/dL (ref 8.4–10.5)
Chloride: 85 mmol/L — ABNORMAL LOW (ref 96–112)
Chloride: 87 mmol/L — ABNORMAL LOW (ref 96–112)
Chloride: 89 mmol/L — ABNORMAL LOW (ref 96–112)
Creatinine, Ser: 0.94 mg/dL (ref 0.50–1.35)
Creatinine, Ser: 1 mg/dL (ref 0.50–1.35)
GFR calc Af Amer: >90 mL/min (ref >90)
GFR calc Af Amer: >90 mL/min (ref >90)
GFR calc non Af Amer: 88 mL/min — ABNORMAL LOW (ref >90)
GFR calc non Af Amer: 79 mL/min — ABNORMAL LOW (ref >90)
GFR, EST NON AFRICAN AMERICAN: 90 mL/min — AB (ref >90)
GLUCOSE: 136 mg/dL — AB (ref 70–99)
Glucose, Bld: 99 mg/dL (ref 70–99)
Glucose, Bld: 90 mg/dL (ref 70–99)
POTASSIUM: 3.9 mmol/L (ref 3.5–5.1)
POTASSIUM: 3.8 mmol/L (ref 3.5–5.1)
Potassium: 4.1 mmol/L (ref 3.5–5.1)
Sodium: 115 mmol/L — CL (ref 135–145)
Sodium: 119 mmol/L — CL (ref 135–145)
Sodium: 120 mmol/L — ABNORMAL LOW (ref 135–145)
TOTAL PROTEIN: 5.6 g/dL — AB (ref 6.0–8.3)
TOTAL PROTEIN: 6.1 g/dL (ref 6.0–8.3)
Total Bilirubin: 0.5 mg/dL (ref 0.3–1.2)
Total Bilirubin: 0.6 mg/dL (ref 0.3–1.2)
Total Bilirubin: 0.6 mg/dL (ref 0.3–1.2)
Total Protein: 6.2 g/dL (ref 6.0–8.3)

## 2014-03-31 LAB — GLUCOSE, CAPILLARY
GLUCOSE-CAPILLARY: 107 mg/dL — AB (ref 70–99)
GLUCOSE-CAPILLARY: 115 mg/dL — AB (ref 70–99)
GLUCOSE-CAPILLARY: 152 mg/dL — AB (ref 70–99)
Glucose-Capillary: 76 mg/dL (ref 70–99)
Glucose-Capillary: 103 mg/dL — ABNORMAL HIGH (ref 70–99)
Glucose-Capillary: 99 mg/dL (ref 70–99)
Glucose-Capillary: 135 mg/dL — ABNORMAL HIGH (ref 70–99)

## 2014-03-31 LAB — CBC WITH DIFFERENTIAL/PLATELET
Basophils Absolute: 0 10*3/uL (ref 0.0–0.1)
Basophils Relative: 0 % (ref 0–1)
EOS PCT: 0 % (ref 0–5)
Eosinophils Absolute: 0 10*3/uL (ref 0.0–0.7)
HCT: 31.3 % — ABNORMAL LOW (ref 39.0–52.0)
HEMOGLOBIN: 10.8 g/dL — AB (ref 13.0–17.0)
Lymphocytes Relative: 14 % (ref 12–46)
Lymphs Abs: 1.4 10*3/uL (ref 0.7–4.0)
MCH: 27.3 pg (ref 26.0–34.0)
MCHC: 34.5 g/dL (ref 30.0–36.0)
MCV: 79.2 fL (ref 78.0–100.0)
MONO ABS: 0.7 10*3/uL (ref 0.1–1.0)
Monocytes Relative: 7 % (ref 3–12)
Neutro Abs: 8 10*3/uL — ABNORMAL HIGH (ref 1.7–7.7)
Neutrophils Relative %: 79 % — ABNORMAL HIGH (ref 43–77)
Platelets: 299 10*3/uL (ref 150–400)
RBC: 3.95 MIL/uL — AB (ref 4.22–5.81)
RDW: 14.8 % (ref 11.5–15.5)
WBC: 10.2 10*3/uL (ref 4.0–10.5)

## 2014-03-31 LAB — MRSA PCR SCREENING: MRSA BY PCR: NEGATIVE

## 2014-03-31 LAB — BASIC METABOLIC PANEL
ANION GAP: 7 (ref 5–15)
Anion gap: 6 (ref 5–15)
BUN: 11 mg/dL (ref 6–23)
BUN: 11 mg/dL (ref 6–23)
CALCIUM: 8.9 mg/dL (ref 8.4–10.5)
CO2: 23 mmol/L (ref 19–32)
CO2: 24 mmol/L (ref 19–32)
Calcium: 8.8 mg/dL (ref 8.4–10.5)
Chloride: 90 mmol/L — ABNORMAL LOW (ref 96–112)
Chloride: 90 mmol/L — ABNORMAL LOW (ref 96–112)
Creatinine, Ser: 1.1 mg/dL (ref 0.50–1.35)
Creatinine, Ser: 1.08 mg/dL (ref 0.50–1.35)
GFR calc Af Amer: 83 mL/min — ABNORMAL LOW (ref >90)
GFR calc non Af Amer: 70 mL/min — ABNORMAL LOW (ref >90)
GFR, EST AFRICAN AMERICAN: 81 mL/min — AB (ref >90)
GFR, EST NON AFRICAN AMERICAN: 72 mL/min — AB (ref >90)
Glucose, Bld: 123 mg/dL — ABNORMAL HIGH (ref 70–99)
Glucose, Bld: 125 mg/dL — ABNORMAL HIGH (ref 70–99)
POTASSIUM: 4 mmol/L (ref 3.5–5.1)
Potassium: 4 mmol/L (ref 3.5–5.1)
SODIUM: 120 mmol/L — AB (ref 135–145)
Sodium: 120 mmol/L — ABNORMAL LOW (ref 135–145)

## 2014-03-31 LAB — TROPONIN I: Troponin I: <0.03 ng/mL (ref <0.031)

## 2014-03-31 LAB — OSMOLALITY: OSMOLALITY: 232 mosm/kg — AB (ref 275–300)

## 2014-03-31 LAB — OSMOLALITY, URINE: Osmolality, Ur: 264 mOsm/kg — ABNORMAL LOW (ref 390–1090)

## 2014-03-31 LAB — BRAIN NATRIURETIC PEPTIDE: B NATRIURETIC PEPTIDE 5: 64.6 pg/mL (ref 0.0–100.0)

## 2014-03-31 MED ORDER — CLONIDINE HCL 0.1 MG PO TABS: 0.1000 mg | ORAL_TABLET | Freq: Two times a day (BID) | ORAL | Status: DC | PRN

## 2014-03-31 MED ORDER — QUETIAPINE FUMARATE ER 50 MG PO TB24
50.0000 mg | ORAL_TABLET | Freq: Every day | ORAL | Status: DC
  Administered 2014-03-31 – 2014-04-02 (×3): 50 mg via ORAL
  Filled 2014-03-31 (×3): qty 1

## 2014-03-31 NOTE — Progress Notes (Signed)
UR completed 

## 2014-03-31 NOTE — Progress Notes (Signed)
CRITICAL VALUE ALERT  Critical value received:  Serum Osmo 232  Date of notification:  03/31/2014  Time of notification:  0422  Critical value read back:Yes.    Nurse who received alert:  Charlyne MomAnthony Briza Bark   MD notified (1st page):  Donnamarie PoagK. Kirby  Time of first page:  0428   MD notified (2nd page):  Time of second page:  Responding MD:   Time MD responded:  Awaiting response

## 2014-03-31 NOTE — ED Notes (Signed)
Attempted to call report

## 2014-03-31 NOTE — Progress Notes (Signed)
Patient complaining of 2/10 chest pain. Attending MD notified. Will hold NPO until MD sees patient.

## 2014-03-31 NOTE — Progress Notes (Addendum)
CRITICAL VALUE ALERT  Critical value received:  Sodium  Date of notification:  03/30/16     Time of notification:  1002  Critical value read back: yes  Nurse who received alert: [id number]  MD notified (1st page): M. Elmahi  Time of first page:  1005  MD notified (2nd page):  Time of second page:  Responding MD:  M. Elmahi  Time MD responded:  1023

## 2014-03-31 NOTE — Progress Notes (Signed)
[facility] PROGRESS NOTE   [name] BanDavid M [name] [id number] DOB: 13-Aug-1950 DOA: 03/30/2014 PCP: Thayer HeadingsMACKENZIE,BRIAN, MD  HPI/Subjective: Denies fever and chills, complaining about some anxiety. Reported he had alcohol withdrawal symptoms from previous times he quit alcohol.  Assessment/Plan: Principal Problem:   Hyponatremia Active Problems:   Polysubstance abuse   Alcohol dependence   Bipolar disorder   Hyponatremia -Likely secondary to beer potomania  -baseline Na appears to be in 120s-130s chronically  -euvolemic to mildly dry on exam -no LE edema, though w/ noted cardiomegaly on CXR -Sodium is <20 in his urine indicating dehydration -1L free fluid restriction  -Check BMP every 8 hours, try to make the correction rate for sodium less than 12 mEq per day.   Anion metabolic acidosis -Likely secondary to ETOH abuse -This is resolved after IV fluid hydration.  ETOH abuse -Thiamine, folate, multivitamin -UDS -CIWA protocol  -pt considering formal rehabilation services   Diastolic dysfunction -prior reading of grade 1 diastolic dysfunction in review of history  -noted normal myoview 12/2011 -euvolemic to mildly dry on exam though with cardiomegaly on imaging -BNP to correlate -2D ECHO   HTN -elevated BP  -resume home meds -follow  Bipolar disorder -stable  -cont home regimen   Epigastric pain -Patient was complaining about epigastric abdominal pain, likely secondary to alcoholic gastritis. -Started on PPI, GI cocktail as needed. Negative cardiac enzymes and negative EKG.   Code Status: Full Code Family Communication: Plan discussed with the patient. Disposition Plan: Remains inpatient Diet: Diet Heart  Consultants:  None  Procedures:  None  Antibiotics:  None   Objective: Filed Vitals:   03/31/14 0837  BP: 106/73  Pulse: 84  Temp: 98.2 F (36.8 C)  Resp:     Intake/Output Summary (Last 24 hours) at 03/31/14 1132 Last data filed  at 03/31/14 0845  Gross per 24 hour  Intake 1649.17 ml  Output   4070 ml  Net -2420.83 ml   Filed Weights   03/30/14 1937 03/31/14 0043 03/31/14 0444  Weight: 88.451 kg (195 lb) 87.816 kg (193 lb 9.6 oz) 87.544 kg (193 lb)    Exam: General: Alert and awake, oriented x3, not in any acute distress. HEENT: anicteric sclera, pupils reactive to light and accommodation, EOMI CVS: S1-S2 clear, no murmur rubs or gallops Chest: clear to auscultation bilaterally, no wheezing, rales or rhonchi Abdomen: soft nontender, nondistended, normal bowel sounds, no organomegaly Extremities: no cyanosis, clubbing or edema noted bilaterally Neuro: Cranial nerves II-XII intact, no focal neurological deficits  Data Reviewed: Basic Metabolic Panel:  Recent Labs Lab 03/30/14 2003 03/30/14 2159 03/31/14 0418 03/31/14 0852  NA 108* 111* 115* 119*  K 4.2 4.0 3.9 3.8  CL 74* 79* 85* 87*  CO2 18* 18* 20 21  GLUCOSE 61* 81 99 136*  BUN 7 8 9 10   CREATININE 0.83 0.83 0.89 0.94  CALCIUM 9.1 8.8 8.6 8.8   Liver Function Tests:  Recent Labs Lab 03/30/14 2003 03/30/14 2159 03/31/14 0418 03/31/14 0852  AST 30 30 27 30   ALT 17 19 17 19   ALKPHOS 91 92 74 73  BILITOT 0.5 0.5 0.5 0.6  PROT 7.1 7.0 6.2 5.6*  ALBUMIN 4.0 4.1 3.5 3.3*    Recent Labs Lab 03/30/14 2003  LIPASE 46    Recent Labs Lab 03/30/14 2224  AMMONIA 53*   CBC:  Recent Labs Lab 03/30/14 2003 03/30/14 2159 03/31/14 0418  WBC 7.8 8.2 10.2  NEUTROABS  --   --  8.0*  HGB  11.9* 13.1 10.8*  HCT 34.0* 37.0* 31.3*  MCV 77.1* 77.6* 79.2  PLT 320 245 299   Cardiac Enzymes:  Recent Labs Lab 03/30/14 2003 03/31/14 0852  TROPONINI <0.03 <0.03   BNP (last 3 results)  Recent Labs  03/03/14 1625 03/30/14 2346  BNP 41.1 64.6    ProBNP (last 3 results) No results for input(s): PROBNP in the last 8760 hours.  CBG:  Recent Labs Lab 03/30/14 2227 03/31/14 0053 03/31/14 0442 03/31/14 0739  GLUCAP 90 76 103*  99    Micro Recent Results (from the past 240 hour(s))  MRSA PCR Screening     Status: None   Collection Time: 03/31/14  1:30 AM  Result Value Ref Range Status   MRSA by PCR NEGATIVE NEGATIVE Final    Comment:        The GeneXpert MRSA Assay (FDA approved for NASAL specimens only), is one component of a comprehensive MRSA colonization surveillance program. It is not intended to diagnose MRSA infection nor to guide or monitor treatment for MRSA infections.      Studies: Ct Head Wo Contrast  03/30/2014   CLINICAL DATA:  Headache beginning today.  EXAM: CT HEAD WITHOUT CONTRAST  TECHNIQUE: Contiguous axial images were obtained from the base of the skull through the vertex without intravenous contrast.  COMPARISON:  Head CT scan 12/10/2013.  FINDINGS: There is some cortical atrophy and chronic microvascular ischemic change. No evidence of acute intracranial abnormality including hemorrhage, infarct, mass lesion, mass effect, midline shift or abnormal extra-axial fluid collection is seen. Atherosclerosis is noted. Mild mucosal thickening is seen in the maxillary sinuses bilaterally. The right maxillary sinus is hypoplastic. Mucous retention cyst or polyp in the right frontal sinus is unchanged. Remote nasal bone fractures are noted.  IMPRESSION: No acute abnormality.  Atrophy and chronic microvascular ischemic change.  Atherosclerosis.  Mild mucosal thickening maxillary sinuses bilaterally.   Electronically Signed   By: Drusilla Kanner M.D.   On: 03/30/2014 20:56   Dg Chest Port 1 View  03/30/2014   CLINICAL DATA:  Chest and abdomen pain starting yesterday  EXAM: PORTABLE CHEST - 1 VIEW  COMPARISON:  March 03, 2014  FINDINGS: The heart size and mediastinal contours are stable. The aorta is tortuous. The heart size is enlarged. There is no focal infiltrate, pulmonary edema, or pleural effusion. The visualized skeletal structures are stable.  IMPRESSION: No active cardiopulmonary disease.   Cardiomegaly.   Electronically Signed   By: Sherian Rein M.D.   On: 03/30/2014 20:25    Scheduled Meds: . aspirin EC  325 mg Oral q morning - 10a  . buPROPion  300 mg Oral Daily  . folic acid  1 mg Oral Daily  . heparin  5,000 Units Subcutaneous 3 times per day  . multivitamin with minerals  1 tablet Oral Daily  . pantoprazole  40 mg Oral Daily  . QUEtiapine  200 mg Oral QHS  . QUEtiapine  50 mg Oral Daily  . thiamine  100 mg Oral Daily   Or  . thiamine  100 mg Intravenous Daily   Continuous Infusions: . sodium chloride 75 mL/hr at 03/31/14 1040       Time spent: 35 minutes    [facility]  [facility] Pager [phone] If 7PM-7AM, please contact night-coverage at [email or web address], password [email or web address] 03/31/2014, 11:32 AM

## 2014-03-31 NOTE — ED Notes (Signed)
cbg reading of 76. Provided drink for patient to consume to encourage increase on blood glucose.

## 2014-03-31 NOTE — Progress Notes (Signed)
  Echocardiogram 2D Echocardiogram has been performed.  Leta JunglingCooper, Jazara Swiney M 03/31/2014, 12:01 PM

## 2014-03-31 NOTE — ED Notes (Signed)
Provided update for Dr. Alvester MorinNewton, troponin negative, chest pain 7/10.

## 2014-04-01 DIAGNOSIS — F319 Bipolar disorder, unspecified: Secondary | ICD-10-CM

## 2014-04-01 DIAGNOSIS — F10120 Alcohol abuse with intoxication, uncomplicated: Secondary | ICD-10-CM

## 2014-04-01 DIAGNOSIS — F10220 Alcohol dependence with intoxication, uncomplicated: Secondary | ICD-10-CM

## 2014-04-01 LAB — BASIC METABOLIC PANEL
Anion gap: 4 — ABNORMAL LOW (ref 5–15)
Anion gap: 6 (ref 5–15)
Anion gap: 5 (ref 5–15)
BUN: 10 mg/dL (ref 6–23)
BUN: 10 mg/dL (ref 6–23)
BUN: 9 mg/dL (ref 6–23)
CALCIUM: 8.8 mg/dL (ref 8.4–10.5)
CHLORIDE: 95 mmol/L — AB (ref 96–112)
CO2: 23 mmol/L (ref 19–32)
CO2: 22 mmol/L (ref 19–32)
CO2: 25 mmol/L (ref 19–32)
CREATININE: 0.94 mg/dL (ref 0.50–1.35)
CREATININE: 0.83 mg/dL (ref 0.50–1.35)
Calcium: 8.5 mg/dL (ref 8.4–10.5)
Calcium: 8.7 mg/dL (ref 8.4–10.5)
Chloride: 93 mmol/L — ABNORMAL LOW (ref 96–112)
Chloride: 93 mmol/L — ABNORMAL LOW (ref 96–112)
Creatinine, Ser: 1.07 mg/dL (ref 0.50–1.35)
GFR calc Af Amer: >90 mL/min (ref >90)
GFR calc Af Amer: >90 mL/min (ref >90)
GFR calc non Af Amer: >90 mL/min (ref >90)
GFR, EST AFRICAN AMERICAN: 84 mL/min — AB (ref >90)
GFR, EST NON AFRICAN AMERICAN: 72 mL/min — AB (ref >90)
GFR, EST NON AFRICAN AMERICAN: 88 mL/min — AB (ref >90)
Glucose, Bld: 96 mg/dL (ref 70–99)
Glucose, Bld: 156 mg/dL — ABNORMAL HIGH (ref 70–99)
Glucose, Bld: 121 mg/dL — ABNORMAL HIGH (ref 70–99)
POTASSIUM: 4.1 mmol/L (ref 3.5–5.1)
Potassium: 4.2 mmol/L (ref 3.5–5.1)
Potassium: 4.7 mmol/L (ref 3.5–5.1)
SODIUM: 120 mmol/L — AB (ref 135–145)
SODIUM: 123 mmol/L — AB (ref 135–145)
Sodium: 123 mmol/L — ABNORMAL LOW (ref 135–145)

## 2014-04-01 LAB — GLUCOSE, CAPILLARY
GLUCOSE-CAPILLARY: 116 mg/dL — AB (ref 70–99)
GLUCOSE-CAPILLARY: 124 mg/dL — AB (ref 70–99)
GLUCOSE-CAPILLARY: 135 mg/dL — AB (ref 70–99)
Glucose-Capillary: 97 mg/dL (ref 70–99)
Glucose-Capillary: 184 mg/dL — ABNORMAL HIGH (ref 70–99)

## 2014-04-01 LAB — CBC WITH DIFFERENTIAL/PLATELET
Basophils Absolute: 0 10*3/uL (ref 0.0–0.1)
Basophils Relative: 0 % (ref 0–1)
Eosinophils Absolute: 0.1 10*3/uL (ref 0.0–0.7)
Eosinophils Relative: 2 % (ref 0–5)
HEMATOCRIT: 29.2 % — AB (ref 39.0–52.0)
Hemoglobin: 10.1 g/dL — ABNORMAL LOW (ref 13.0–17.0)
LYMPHS ABS: 1.9 10*3/uL (ref 0.7–4.0)
LYMPHS PCT: 32 % (ref 12–46)
MCH: 27.2 pg (ref 26.0–34.0)
MCHC: 34.6 g/dL (ref 30.0–36.0)
MCV: 78.5 fL (ref 78.0–100.0)
Monocytes Absolute: 0.7 10*3/uL (ref 0.1–1.0)
Monocytes Relative: 11 % (ref 3–12)
Neutro Abs: 3.3 10*3/uL (ref 1.7–7.7)
Neutrophils Relative %: 55 % (ref 43–77)
Platelets: 262 10*3/uL (ref 150–400)
RBC: 3.72 MIL/uL — AB (ref 4.22–5.81)
RDW: 15.1 % (ref 11.5–15.5)
WBC: 5.9 10*3/uL (ref 4.0–10.5)

## 2014-04-01 MED ORDER — DEXTROSE 5 % IV SOLN
INTRAVENOUS | Status: DC
  Administered 2014-04-01: 08:00:00 via INTRAVENOUS

## 2014-04-01 MED ORDER — DEXTROSE 5 % IV SOLN
INTRAVENOUS | Status: DC
  Administered 2014-04-01: 19:00:00 via INTRAVENOUS

## 2014-04-01 NOTE — Progress Notes (Signed)
Notified [name] PoagK. Kirby, NP about pt's BP 83/56. Pt is asymptomatic; no new orders received at this time. Will continue to monitor pt.

## 2014-04-01 NOTE — Progress Notes (Signed)
May downgrade from SDU to telemetry. BP still low so he needs telemetry monitoring. BMP can be obtained in am.  Manson PasseyAlma Jinelle Butchko [facility] [phone]

## 2014-04-01 NOTE — Progress Notes (Signed)
Patient ID: [name], male   DOB: 1950-07-23, 64 y.o.   MRN: [id number] [facility] PROGRESS NOTE  CINDY BRINDISI WUJ:[id number] DOB: [birth year] DOA: 03/30/2014 PCP: Thayer Headings, MD  Brief narrative:    64 y.o. year old male with significant past medical history of alcohol dependence, alcohol abuse, CAD, grade 1 diastolic dysfunction, bipolar disorder, alcohol withdrawal associated seizures who presented to [facility] ED after extensive alcohol binge drinking, drinking more than 10 beers a day. On admission, he was found to have sodium of 108. During this hospital stays, sodium has improved ot 120 and remains stable in that range. Baseline is around 124 (comapred with sodium 3 months ago).  Assessment/Plan:    Principal Problem:   Hyponatremia - Secondary to beer protomania and alcohol abuse - Sodium initially 108 and now stable at 120 - Change IV fluids to D5 water - Follow up BMP in am - PT eval for safe discharge plan  Active Problems: Acute alcohol intoxication - Alcohol level on admission 34 - Placed on CIWA protocol - No reports of withdrawals - Continue multivitamin, folic acid, thiamine   Anion metabolic acidosis - Likely secondary to acute alcohol intoxication - Resolved with IV fluids  Chronic diastolic dysfunction / Alcohol related cardiomyopathy  - On most recent 2 D ECHO 03/31/14 grade 1 diastolic dysfunction with preserved EF - Compensated   Essential hypertension - Now hypotensive so BP meds on hold   Bipolar disorder - stable - Resume Wellbutrin and Seroquel   Epigastric pain - Likely related to alcohol related gastritis - Better this am    Code Status: Full.  Family Communication:  plan of care discussed with the patient Disposition Plan: Home once sodium level closer to normal range.    IV access:  Peripheral IV  Procedures and diagnostic studies:    Ct Head Wo Contrast 03/30/2014    No acute abnormality.  Atrophy and chronic microvascular ischemic  change.  Atherosclerosis.  Mild mucosal thickening maxillary sinuses bilaterally.    Dg Chest Port 1 View 03/30/2014  No active cardiopulmonary disease.  Cardiomegaly.     Medical Consultants:  None   Other Consultants:  Physical therapy  IAnti-Infectives:   None    Manson Passey, MD  [facility] Pager [phone]  If 7PM-7AM, please contact night-coverage [email or web address] Password [email or web address] 04/01/2014, 11:45 AM   LOS: 1 day    HPI/Subjective: No acute overnight events.  Objective: Filed Vitals:   04/01/14 0700 04/01/14 0826 04/01/14 1058 04/01/14 1124  BP:   Pulse: 76     Temp: 98.2 F (36.8 C)   98.2 F (36.8 C)  TempSrc: Oral   Oral  Resp: 12     Height:      Weight:      SpO2: 97%       Intake/Output Summary (Last 24 hours) at 04/01/14 1145 Last data filed at 04/01/14 1125  Gross per 24 hour  Intake 1945.33 ml  Output   1700 ml  Net 245.33 ml    Exam:   General:  Pt is alert, follows commands appropriately, not in acute distress  Cardiovascular: Regular rate and rhythm, S1/S2 (+)  Respiratory: Clear to auscultation bilaterally, no wheezing, no crackles, no rhonchi  Abdomen: Soft, non tender, non distended, bowel sounds present  Extremities: No edema, pulses DP and PT palpable bilaterally  Neuro: Grossly nonfocal  Data Reviewed: Basic Metabolic Panel:  Recent Labs Lab 03/31/14 0852 03/31/14 1310 03/31/14 1752 03/31/14 2055 04/01/14  0454  NA 119* 120* 120* 120* 120*  K 3.8 4.1 4.0 4.0 4.2  CL 87* 89* 90* 90* 93*  CO2 21 22 23 24 23   GLUCOSE 136* 90 123* 125* 96  BUN 10 10 11 11 10   CREATININE 0.94 1.00 1.10 1.08 1.07  CALCIUM 8.8 9.2 8.9 8.8 8.5   Liver Function Tests:  Recent Labs Lab 03/30/14 2003 03/30/14 2159 03/31/14 0418 03/31/14 0852 03/31/14 1310  AST 30 30 27 30  32  ALT 17 19 17 19 21   ALKPHOS 91 92 74 73 76  BILITOT 0.5 0.5 0.5 0.6 0.6  PROT 7.1 7.0 6.2 5.6* 6.1  ALBUMIN 4.0 4.1 3.5 3.3* 3.5     Recent Labs Lab 03/30/14 2003  LIPASE 46    Recent Labs Lab 03/30/14 2224  AMMONIA 53*   CBC:  Recent Labs Lab 03/30/14 2003 03/30/14 2159 03/31/14 0418 04/01/14 0454  WBC 7.8 8.2 10.2 5.9  NEUTROABS  --   --  8.0* 3.3  HGB 11.9* 13.1 10.8* 10.1*  HCT 34.0* 37.0* 31.3* 29.2*  MCV 77.1* 77.6* 79.2 78.5  PLT 320 245 299 262   Cardiac Enzymes:  Recent Labs Lab 03/30/14 2003 03/31/14 0852 03/31/14 1310 03/31/14 1943  TROPONINI <0.03 <0.03 <0.03 <0.03   BNP: Invalid input(s): POCBNP CBG:  Recent Labs Lab 03/31/14 1138 03/31/14 1657 03/31/14 2016 03/31/14 2347 04/01/14 0746  GLUCAP 115* 152* 135* 107* 97    Recent Results (from the past 240 hour(s))  MRSA PCR Screening     Status: None   Collection Time: 03/31/14  1:30 AM  Result Value Ref Range Status   MRSA by PCR NEGATIVE NEGATIVE Final     Scheduled Meds: . aspirin EC  325 mg Oral q morning - 10a  . buPROPion  300 mg Oral Daily  . folic acid  1 mg Oral Daily  . heparin  5,000 Units Subcutaneous 3 times per day  . multivitamin  1 tablet Oral Daily  . pantoprazole  40 mg Oral Daily  . QUEtiapine  200 mg Oral QHS  . QUEtiapine  50 mg Oral Daily  . thiamine  100 mg Oral Daily   Continuous Infusions: . dextrose 50 mL/hr at 04/01/14 [phone]

## 2014-04-02 DIAGNOSIS — I10 Essential (primary) hypertension: Secondary | ICD-10-CM | POA: Insufficient documentation

## 2014-04-02 LAB — BASIC METABOLIC PANEL
Anion gap: 7 (ref 5–15)
BUN: 6 mg/dL (ref 6–23)
CO2: 22 mmol/L (ref 19–32)
Calcium: 8.8 mg/dL (ref 8.4–10.5)
Chloride: 97 mmol/L (ref 96–112)
Creatinine, Ser: 0.78 mg/dL (ref 0.50–1.35)
Glucose, Bld: 121 mg/dL — ABNORMAL HIGH (ref 70–99)
Potassium: 3.9 mmol/L (ref 3.5–5.1)
SODIUM: 126 mmol/L — AB (ref 135–145)

## 2014-04-02 LAB — CBC WITH DIFFERENTIAL/PLATELET
BASOS ABS: 0 10*3/uL (ref 0.0–0.1)
BASOS PCT: 1 % (ref 0–1)
EOS ABS: 0.2 10*3/uL (ref 0.0–0.7)
EOS PCT: 3 % (ref 0–5)
HEMATOCRIT: 29.2 % — AB (ref 39.0–52.0)
Hemoglobin: 9.9 g/dL — ABNORMAL LOW (ref 13.0–17.0)
LYMPHS PCT: 40 % (ref 12–46)
Lymphs Abs: 2.4 10*3/uL (ref 0.7–4.0)
MCH: 27.4 pg (ref 26.0–34.0)
MCHC: 33.9 g/dL (ref 30.0–36.0)
MCV: 80.9 fL (ref 78.0–100.0)
Monocytes Absolute: 0.6 10*3/uL (ref 0.1–1.0)
Monocytes Relative: 10 % (ref 3–12)
Neutro Abs: 2.8 10*3/uL (ref 1.7–7.7)
Neutrophils Relative %: 46 % (ref 43–77)
PLATELETS: 259 10*3/uL (ref 150–400)
RBC: 3.61 MIL/uL — ABNORMAL LOW (ref 4.22–5.81)
RDW: 15.6 % — ABNORMAL HIGH (ref 11.5–15.5)
WBC: 6.1 10*3/uL (ref 4.0–10.5)

## 2014-04-02 LAB — GLUCOSE, CAPILLARY
GLUCOSE-CAPILLARY: 119 mg/dL — AB (ref 70–99)
Glucose-Capillary: 105 mg/dL — ABNORMAL HIGH (ref 70–99)
Glucose-Capillary: 127 mg/dL — ABNORMAL HIGH (ref 70–99)
Glucose-Capillary: 113 mg/dL — ABNORMAL HIGH (ref 70–99)

## 2014-04-02 MED ORDER — ADULT MULTIVITAMIN W/MINERALS CH: 1.0000 | ORAL_TABLET | Freq: Every day | ORAL | Status: DC

## 2014-04-02 MED ORDER — LORAZEPAM 1 MG PO TABS: 1.0000 mg | ORAL_TABLET | Freq: Three times a day (TID) | ORAL | Status: DC | PRN

## 2014-04-02 MED ORDER — FOLIC ACID 1 MG PO TABS: 1.0000 mg | ORAL_TABLET | Freq: Every day | ORAL | Status: DC

## 2014-04-02 MED ORDER — THIAMINE HCL 100 MG PO TABS: 100.0000 mg | ORAL_TABLET | Freq: Every day | ORAL | Status: DC

## 2014-04-02 MED ORDER — ASPIRIN EC 325 MG PO TBEC: 325.0000 mg | DELAYED_RELEASE_TABLET | Freq: Every morning | ORAL | Status: AC

## 2014-04-02 MED ORDER — PANTOPRAZOLE SODIUM 40 MG PO TBEC: 40.0000 mg | DELAYED_RELEASE_TABLET | Freq: Every day | ORAL | Status: DC

## 2014-04-02 NOTE — Care Management Note (Signed)
    Page 1 of 1   04/02/2014     11:54:46 AM CARE MANAGEMENT NOTE 04/02/2014  Patient:  [name],[name]   Account Number:  [id number]  Date Initiated:  04/02/2014  Documentation initiated by:  GRAVES-BIGELOW,Kely Dohn  Subjective/Objective Assessment:   Pt admitted for Cp. Plan for d/c today.     Action/Plan:   NO needs identified by CM at this time.   Anticipated DC Date:  04/02/2014   Anticipated DC Plan:  HOME/SELF CARE      DC Planning Services  CM consult      Choice offered to / List presented to:             Status of service:  Completed, signed off Medicare Important Message given?  YES (If response is "NO", the following Medicare IM given date fields will be blank) Date Medicare IM given:  04/02/2014 Medicare IM given by:  GRAVES-BIGELOW,Arieanna Pressey Date Additional Medicare IM given:   Additional Medicare IM given by:    Discharge Disposition:  HOME/SELF CARE  Per UR Regulation:  Reviewed for med. necessity/level of care/duration of stay  If discussed at Long Length of Stay Meetings, dates discussed:    Comments:

## 2014-04-02 NOTE — Evaluation (Signed)
Physical Therapy Evaluation and D/C Patient Details Name: VISHWA DAIS MRN: [id number] DOB: [birth year] Today's Date: 04/02/2014   History of Present Illness  Pt admit with hyponatremia.  ETOH and bipolar.  Clinical Impression  Pt admitted with above diagnosis. Pt currently without significant functional limitations and is ambulating at independent level.  Pt states he only falls when drinking.  He states he will have a friend at home.  Thinking about if he wants a RW. Pt will not need skilled PT.  States he will stay with friend or friend stay with him.       Follow Up Recommendations No PT follow up    Equipment Recommendations  Rolling walker with 5" wheels (States he may want one)    Recommendations for Other Services       Precautions / Restrictions Precautions Precautions: Fall Restrictions Weight Bearing Restrictions: No      Mobility  Bed Mobility Overal bed mobility: Independent                Transfers Overall transfer level: Independent                  Ambulation/Gait Ambulation/Gait assistance: Supervision Ambulation Distance (Feet): 350 Feet Assistive device: Rolling walker (2 wheeled);None Gait Pattern/deviations: Step-through pattern;Decreased stride length   Gait velocity interpretation: <1.8 ft/sec, indicative of risk for recurrent falls General Gait Details: Pt ambulated with and without RW.  Pt without LOB either way but felt safer with RW.  States he will think about getting one.    Stairs            Wheelchair Mobility    Modified Rankin (Stroke Patients Only)       Balance Overall balance assessment: Needs assistance;History of Falls         Standing balance support: No upper extremity supported;During functional activity Standing balance-Leahy Scale: Fair Standing balance comment: Pt Stands with and without UE support statically without LOB.                               Pertinent Vitals/Pain Pain  Assessment: No/denies pain  VSS    Home Living Family/patient expects to be discharged to:: Private residence Living Arrangements: Alone Available Help at Discharge: Friend(s);Family;Available PRN/intermittently Type of Home: Apartment Home Access: Level entry     Home Layout: One level Home Equipment: Cane - single point;Tub bench      Prior Function Level of Independence: Independent         Comments: Pt reports he goes to gym at least 3 days/wk.  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, [employer]   Dominant Hand: Right    Extremity/Trunk Assessment   Upper Extremity Assessment: Defer to OT evaluation           Lower Extremity Assessment: Generalized weakness      Cervical / Trunk Assessment: Normal  Communication   Communication: No difficulties  Cognition Arousal/Alertness: Awake/alert Behavior During Therapy: WFL for tasks assessed/performed Overall Cognitive Status: Within Functional Limits for tasks assessed                      General Comments      Exercises        Assessment/Plan    PT Assessment Patent does not need any further PT services  PT Diagnosis Generalized weakness   PT Problem List  PT Treatment Interventions     PT Goals (Current goals can be found in the Care Plan section) Acute Rehab PT Goals PT Goal Formulation: All assessment and education complete, DC therapy    Frequency     Barriers to discharge        Co-evaluation               End of Session Equipment Utilized During Treatment: Gait belt Activity Tolerance: Patient tolerated treatment well Patient left: in bed;with call bell/phone within reach Nurse Communication: Mobility status         Time: 1005-1016 PT Time Calculation (min) (ACUTE ONLY): 11 min   Charges:   PT Evaluation $Initial PT Evaluation Tier I: 1 Procedure     PT G Codes:        Cristopher Ciccarelli F 04/02/2014, 1:30 PM  [facility],PT Acute  Rehabilitation [phone] (pager)

## 2014-04-02 NOTE — Discharge Summary (Signed)
Physician Discharge Summary  [name] ZOX:[id number] DOB: [birth year] DOA: 03/30/2014  PCP: Thayer Headings, MD  Admit date: 03/30/2014 Discharge date: 04/02/2014  Recommendations for Outpatient Follow-up:  1. Check CBC and BMP during next visit with PCP 2. Metoprolol on hold since BP on soft side. Should avoid clonidine since he is abusing alcohol and is at high risk of non compliance and potential rebound hypertension   Discharge Diagnoses:  Principal Problem:   Hyponatremia Active Problems:   Polysubstance abuse   Alcohol dependence   Bipolar disorder    Discharge Condition: stable   Diet recommendation: as tolerated   History of present illness:  64 y.o. year old male with significant past medical history of alcohol dependence, alcohol abuse, CAD, grade 1 diastolic dysfunction, bipolar disorder, alcohol withdrawal associated seizures who presented to [facility] ED after extensive alcohol binge drinking, drinking more than 10 beers a day. On admission, he was found to have sodium of 108. During this hospital stays, sodium has improved ot 120 and remains stable in that range. Baseline is around 124 (comapred with sodium 3 months ago).  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  Bipolar disorder - stable - Resume Wellbutrin and Seroquel on discharge   Epigastric pain - Likely related to alcohol related gastritis - Resolved     Code Status: Full.  Family Communication: plan of care discussed with the patient   IV access:  Peripheral IV  Procedures and diagnostic studies:   Ct Head Wo Contrast 03/30/2014 No acute abnormality. Atrophy and chronic microvascular ischemic change. Atherosclerosis. Mild mucosal thickening maxillary sinuses bilaterally.   Dg Chest Port 1 View 03/30/2014 No active cardiopulmonary disease. Cardiomegaly.   Medical Consultants:  None   Other Consultants:  Physical therapy  IAnti-Infectives:   None    Signed:  Manson Passey, MD  [facility] 04/02/2014, 8:32 AM  Pager #: [phone]  Discharge Exam: Filed Vitals:   04/02/14 0502  BP: 84/57  Pulse: 75  Temp: 98 F (36.7 C)  Resp: 16   Filed Vitals:   04/01/14 1124 04/01/14 1633 04/01/14 2150 04/02/14 0502  BP:   Pulse:  70 81 75  Temp: 98.2 F (36.8 C) 98.2 F (36.8 C) 98.1 F (36.7 C) 98 F (36.7 C)  TempSrc: Oral Oral Oral Oral  Resp:  Height:      Weight:    86.546 kg (190 lb 12.8 oz)  SpO2:  96% 100% 96%    General: Pt is alert, follows commands appropriately, not in acute distress Cardiovascular: Regular rate and rhythm, S1/S2 +, no murmurs Respiratory: Clear to  auscultation bilaterally, no wheezing, no crackles, no rhonchi Abdominal: Soft, non tender, non distended, bowel sounds +, no guarding Extremities: no edema, no cyanosis, pulses palpable bilaterally DP and PT Neuro: Grossly nonfocal  Discharge Instructions  Discharge Instructions    Call MD for:  difficulty breathing, headache or visual disturbances    Complete by:  As directed      Call MD for:   persistant nausea and vomiting    Complete by:  As directed      Call MD for:  severe uncontrolled pain    Complete by:  As directed      Diet - low sodium heart healthy    Complete by:  As directed      Increase activity slowly    Complete by:  As directed             Medication List    STOP taking these medications        cloNIDine 0.1 MG tablet  Commonly known as:  CATAPRES     DAYQUIL PO     metoprolol succinate 25 MG 24 hr tablet  Commonly known as:  TOPROL-XL     oxyCODONE-acetaminophen 5-325 MG per tablet  Commonly known as:  PERCOCET/ROXICET      TAKE these medications        aspirin EC 325 MG tablet  Take 1 tablet (325 mg total) by mouth every morning. For blood thinner for heart health     atorvastatin 40 MG tablet  Commonly known as:  LIPITOR  Take 1 tablet (40 mg total) by mouth daily. For cholesterol     buPROPion 300 MG 24 hr tablet  Commonly known as:  WELLBUTRIN XL  Take 1 tablet (300 mg total) by mouth daily. For depression     cyclobenzaprine 5 MG tablet  Commonly known as:  FLEXERIL  Take 1 tablet (5 mg total) by mouth 3 (three) times daily as needed for muscle spasms.     folic acid 1 MG tablet  Commonly known as:  FOLVITE  Take 1 tablet (1 mg total) by mouth daily.     hydrOXYzine 25 MG tablet  Commonly known as:  ATARAX/VISTARIL  Take 1 tablet (25 mg total) by mouth every 6 (six) hours as needed for anxiety.     LORazepam 1 MG tablet  Commonly known as:  ATIVAN  Take 1 tablet (1 mg total) by mouth every 8 (eight) hours as needed (CIWA-AR > 8-OR-withdrawal symptoms:anxiety, agitation, insomnia, diaphoresis, nausea, vomiting, tremors, tachycardia, or hypertension.).     multivitamin with minerals Tabs tablet  Take 1 tablet by mouth daily.     nitroGLYCERIN 0.4 MG SL tablet  Commonly known as:  NITROSTAT  Place 1 tablet (0.4 mg total) under the tongue every 5 (five) minutes as needed for chest pain (CP or SOB).     pantoprazole  40 MG tablet  Commonly known as:  PROTONIX  Take 1 tablet (40 mg total) by mouth daily.     QUEtiapine 50 MG Tb24 24 hr tablet  Commonly known as:  SEROQUEL XR  Take 5 tablets (250 mg total) by mouth at bedtime.     thiamine 100 MG tablet  Take 1 tablet (100 mg total) by mouth daily.           Follow-up Information    Follow up with Thayer Headings, MD. Schedule an appointment as soon as possible for a visit in 1 week.   Specialty:  Internal Medicine  Why:  Follow up appt after recent hospitalization   Contact information:   [address] [place] [address] [phone]        The results of significant diagnostics from this hospitalization (including imaging, microbiology, ancillary and laboratory) are listed below for reference.    Significant Diagnostic Studies: Dg Chest 2 View (if Patient Has Fever And/or Copd)  03/03/2014   CLINICAL DATA:  Posterior right-sided chest pain/back pain for 3 days. Shortness of breath.  EXAM: CHEST  2 VIEW  COMPARISON:  12/10/2013  FINDINGS: Left PICC line has been removed. The cardiac silhouette remains mildly enlarged. Thoracic aorta is tortuous, similar to prior. Patchy right upper lobe opacities on the prior study have resolved. Minimal scarring versus subsegmental atelectasis is noted in the left lung base. There is no evidence of new airspace consolidation, edema, pleural effusion, or pneumothorax. Chronic T6 compression fracture is again noted. Right shoulder arthroplasty is noted. Old right eighth rib fracture is again seen.  IMPRESSION: Interval clearing of right upper lobe infiltrate. No evidence of acute airspace disease.   Electronically Signed   By: Sebastian Ache   On: 03/03/2014 17:28   Ct Head Wo Contrast  03/30/2014   CLINICAL DATA:  Headache beginning today.  EXAM: CT HEAD WITHOUT CONTRAST  TECHNIQUE: Contiguous axial images were obtained from the base of the skull through the vertex without intravenous contrast.   COMPARISON:  Head CT scan 12/10/2013.  FINDINGS: There is some cortical atrophy and chronic microvascular ischemic change. No evidence of acute intracranial abnormality including hemorrhage, infarct, mass lesion, mass effect, midline shift or abnormal extra-axial fluid collection is seen. Atherosclerosis is noted. Mild mucosal thickening is seen in the maxillary sinuses bilaterally. The right maxillary sinus is hypoplastic. Mucous retention cyst or polyp in the right frontal sinus is unchanged. Remote nasal bone fractures are noted.  IMPRESSION: No acute abnormality.  Atrophy and chronic microvascular ischemic change.  Atherosclerosis.  Mild mucosal thickening maxillary sinuses bilaterally.   Electronically Signed   By: Drusilla Kanner M.D.   On: 03/30/2014 20:56   Dg Chest Port 1 View  03/30/2014   CLINICAL DATA:  Chest and abdomen pain starting yesterday  EXAM: PORTABLE CHEST - 1 VIEW  COMPARISON:  March 03, 2014  FINDINGS: The heart size and mediastinal contours are stable. The aorta is tortuous. The heart size is enlarged. There is no focal infiltrate, pulmonary edema, or pleural effusion. The visualized skeletal structures are stable.  IMPRESSION: No active cardiopulmonary disease.  Cardiomegaly.   Electronically Signed   By: Sherian Rein M.D.   On: 03/30/2014 20:25    Microbiology: Recent Results (from the past 240 hour(s))  MRSA PCR Screening     Status: None   Collection Time: 03/31/14  1:30 AM  Result Value Ref Range Status   MRSA by PCR NEGATIVE NEGATIVE Final    Comment:        The GeneXpert MRSA Assay (FDA approved for NASAL specimens only), is one component of a comprehensive MRSA colonization surveillance program. It is not intended to diagnose MRSA infection nor to guide or monitor treatment for MRSA infections.      Labs: Basic Metabolic Panel:  Recent Labs Lab 03/31/14 2055 04/01/14 0454 04/01/14 1026 04/01/14 1544 04/02/14 0650  NA 120* 120* 123* 123* 126*  K  4.0 4.2 4.1 4.7 3.9  CL 90* 93* 95* 93* 97  CO2 GLUCOSE 125* 96 156* 121* 121*  BUN 11 10 10 9 6   CREATININE 1.08 1.07 0.94 0.83 0.78  CALCIUM 8.8 8.5 8.8 8.7 8.8   Liver Function Tests:  Recent Labs Lab 03/30/14 2003 03/30/14 2159 03/31/14 0418 03/31/14 0852 03/31/14 1310  AST 30 30 27 30  32  ALT 17 19 17 19 21   ALKPHOS 91 92 74 73 76  BILITOT 0.5 0.5 0.5 0.6 0.6  PROT 7.1 7.0 6.2 5.6* 6.1  ALBUMIN 4.0 4.1 3.5 3.3* 3.5    Recent Labs Lab 03/30/14 2003  LIPASE 46    Recent Labs Lab 03/30/14 2224  AMMONIA 53*   CBC:  Recent Labs Lab 03/30/14 2003 03/30/14 2159 03/31/14 0418 04/01/14 0454 04/02/14 0650  WBC 7.8 8.2 10.2 5.9 6.1  NEUTROABS  --   --  8.0* 3.3 2.8  HGB 11.9* 13.1 10.8* 10.1* 9.9*  HCT 34.0* 37.0* 31.3* 29.2* 29.2*  MCV 77.1* 77.6* 79.2 78.5 80.9  PLT 320 245 299 262 259   Cardiac Enzymes:  Recent Labs Lab 03/30/14 2003 03/31/14 0852 03/31/14 1310 03/31/14 1943  TROPONINI <0.03 <0.03 <0.03 <0.03   BNP: BNP (last 3 results)  Recent Labs  03/03/14 1625 03/30/14 2346  BNP 41.1 64.6    ProBNP (last 3 results) No results for input(s): PROBNP in the last 8760 hours.  CBG:  Recent Labs Lab 04/01/14 1636 04/01/14 2148 04/01/14 2350 04/02/14 0500 04/02/14 0745  GLUCAP 116* 184* 124* 127* 119*    Time coordinating discharge: Over 30 minutes

## 2014-04-02 NOTE — Discharge Instructions (Signed)
Hyponatremia  °Hyponatremia is when the amount of salt (sodium) in your blood is too low. When sodium levels are low, your cells will absorb extra water and swell. The swelling happens throughout the body, but it mostly affects the brain. Severe brain swelling (cerebral edema), seizures, or coma can happen.  °CAUSES  °· Heart, kidney, or liver problems. °· Thyroid problems. °· Adrenal gland problems. °· Severe vomiting and diarrhea. °· Certain medicines or illegal drugs. °· Dehydration. °· Drinking too much water. °· Low-sodium diet. °SYMPTOMS  °· Nausea and vomiting. °· Confusion. °· Lethargy. °· Agitation. °· Headache. °· Twitching or shaking (seizures). °· Unconsciousness. °· Appetite loss. °· Muscle weakness and cramping. °DIAGNOSIS  °Hyponatremia is identified by a simple blood test. Your caregiver will perform a history and physical exam to try to find the cause and type of hyponatremia. Other tests may be needed to measure the amount of sodium in your blood and urine. °TREATMENT  °Treatment will depend on the cause.  °· Fluids may be given through the vein (IV). °· Medicines may be used to correct the sodium imbalance. If medicines are causing the problem, they will need to be adjusted. °· Water or fluid intake may be restricted to restore proper balance. °The speed of correcting the sodium problem is very important. If the problem is corrected too fast, nerve damage (sometimes unchangeable) can happen. °HOME CARE INSTRUCTIONS  °· Only take medicines as directed by your caregiver. Many medicines can make hyponatremia worse. Discuss all your medicines with your caregiver. °· Carefully follow any recommended diet, including any fluid restrictions. °· You may be asked to repeat lab tests. Follow these directions. °· Avoid alcohol and recreational drugs. °SEEK MEDICAL CARE IF:  °· You develop worsening nausea, fatigue, headache, confusion, or weakness. °· Your original hyponatremia symptoms return. °· You have  problems following the recommended diet. °SEEK IMMEDIATE MEDICAL CARE IF:  °· You have a seizure. °· You faint. °· You have ongoing diarrhea or vomiting. °MAKE SURE YOU:  °· Understand these instructions. °· Will watch your condition. °· Will get help right away if you are not doing well or get worse. °Document Released: 12/30/2001 Document Revised: 04/03/2011 Document Reviewed: 06/26/2010 °[employer]® Patient Information ©2015 [employer]. This information is not intended to replace advice given to you by your health care provider. Make sure you discuss any questions you have with your health care provider. ° °

## 2014-05-01 ENCOUNTER — Other Ambulatory Visit: Payer: Self-pay | Admitting: Gastroenterology

## 2014-05-22 ENCOUNTER — Inpatient Hospital Stay (HOSPITAL_COMMUNITY)
Admission: EM | Admit: 2014-05-22 | Discharge: 2014-05-23 | DRG: 303 | Disposition: A | Discharge status: Home / Self Care | Payer: Medicare Other | Attending: Internal Medicine | Admitting: Internal Medicine

## 2014-05-22 ENCOUNTER — Emergency Department (HOSPITAL_COMMUNITY): Payer: Medicare Other

## 2014-05-22 ENCOUNTER — Encounter (HOSPITAL_COMMUNITY): Payer: Self-pay | Admitting: *Deleted

## 2014-05-22 DIAGNOSIS — M1990 Unspecified osteoarthritis, unspecified site: Secondary | ICD-10-CM | POA: Diagnosis not present

## 2014-05-22 DIAGNOSIS — Z8701 Personal history of pneumonia (recurrent): Secondary | ICD-10-CM | POA: Diagnosis not present

## 2014-05-22 DIAGNOSIS — I2510 Atherosclerotic heart disease of native coronary artery without angina pectoris: Secondary | ICD-10-CM | POA: Diagnosis present

## 2014-05-22 DIAGNOSIS — Z7982 Long term (current) use of aspirin: Secondary | ICD-10-CM

## 2014-05-22 DIAGNOSIS — Z9049 Acquired absence of other specified parts of digestive tract: Secondary | ICD-10-CM | POA: Diagnosis present

## 2014-05-22 DIAGNOSIS — I5032 Chronic diastolic (congestive) heart failure: Secondary | ICD-10-CM | POA: Diagnosis present

## 2014-05-22 DIAGNOSIS — R079 Chest pain, unspecified: Secondary | ICD-10-CM | POA: Diagnosis not present

## 2014-05-22 DIAGNOSIS — Z888 Allergy status to other drugs, medicaments and biological substances status: Secondary | ICD-10-CM | POA: Diagnosis not present

## 2014-05-22 DIAGNOSIS — D649 Anemia, unspecified: Secondary | ICD-10-CM | POA: Diagnosis present

## 2014-05-22 DIAGNOSIS — F319 Bipolar disorder, unspecified: Secondary | ICD-10-CM | POA: Diagnosis present

## 2014-05-22 DIAGNOSIS — F1020 Alcohol dependence, uncomplicated: Secondary | ICD-10-CM | POA: Diagnosis present

## 2014-05-22 DIAGNOSIS — Z79899 Other long term (current) drug therapy: Secondary | ICD-10-CM | POA: Diagnosis not present

## 2014-05-22 DIAGNOSIS — I200 Unstable angina: Secondary | ICD-10-CM

## 2014-05-22 DIAGNOSIS — Z9889 Other specified postprocedural states: Secondary | ICD-10-CM | POA: Diagnosis not present

## 2014-05-22 DIAGNOSIS — F1010 Alcohol abuse, uncomplicated: Secondary | ICD-10-CM | POA: Diagnosis present

## 2014-05-22 DIAGNOSIS — I25110 Atherosclerotic heart disease of native coronary artery with unstable angina pectoris: Secondary | ICD-10-CM | POA: Diagnosis present

## 2014-05-22 DIAGNOSIS — F17210 Nicotine dependence, cigarettes, uncomplicated: Secondary | ICD-10-CM | POA: Diagnosis present

## 2014-05-22 DIAGNOSIS — Z96643 Presence of artificial hip joint, bilateral: Secondary | ICD-10-CM | POA: Diagnosis present

## 2014-05-22 DIAGNOSIS — E785 Hyperlipidemia, unspecified: Secondary | ICD-10-CM | POA: Diagnosis present

## 2014-05-22 DIAGNOSIS — F10220 Alcohol dependence with intoxication, uncomplicated: Secondary | ICD-10-CM | POA: Diagnosis not present

## 2014-05-22 DIAGNOSIS — I341 Nonrheumatic mitral (valve) prolapse: Secondary | ICD-10-CM | POA: Diagnosis present

## 2014-05-22 DIAGNOSIS — Z9861 Coronary angioplasty status: Secondary | ICD-10-CM | POA: Diagnosis not present

## 2014-05-22 DIAGNOSIS — F419 Anxiety disorder, unspecified: Secondary | ICD-10-CM | POA: Diagnosis present

## 2014-05-22 DIAGNOSIS — I10 Essential (primary) hypertension: Secondary | ICD-10-CM | POA: Diagnosis present

## 2014-05-22 DIAGNOSIS — Z720 Tobacco use: Secondary | ICD-10-CM | POA: Diagnosis not present

## 2014-05-22 DIAGNOSIS — E871 Hypo-osmolality and hyponatremia: Secondary | ICD-10-CM | POA: Diagnosis present

## 2014-05-22 DIAGNOSIS — G4700 Insomnia, unspecified: Secondary | ICD-10-CM | POA: Diagnosis present

## 2014-05-22 DIAGNOSIS — K219 Gastro-esophageal reflux disease without esophagitis: Secondary | ICD-10-CM | POA: Diagnosis not present

## 2014-05-22 DIAGNOSIS — Z96611 Presence of right artificial shoulder joint: Secondary | ICD-10-CM | POA: Diagnosis present

## 2014-05-22 DIAGNOSIS — I25119 Atherosclerotic heart disease of native coronary artery with unspecified angina pectoris: Secondary | ICD-10-CM | POA: Diagnosis not present

## 2014-05-22 DIAGNOSIS — Z885 Allergy status to narcotic agent status: Secondary | ICD-10-CM

## 2014-05-22 LAB — BRAIN NATRIURETIC PEPTIDE: B NATRIURETIC PEPTIDE 5: 61.5 pg/mL (ref 0.0–100.0)

## 2014-05-22 LAB — CBC WITH DIFFERENTIAL/PLATELET
BASOS ABS: 0 10*3/uL (ref 0.0–0.1)
Basophils Relative: 0 % (ref 0–1)
EOS PCT: 1 % (ref 0–5)
Eosinophils Absolute: 0.1 10*3/uL (ref 0.0–0.7)
HCT: 34.5 % — ABNORMAL LOW (ref 39.0–52.0)
Hemoglobin: 11.9 g/dL — ABNORMAL LOW (ref 13.0–17.0)
LYMPHS ABS: 1.5 10*3/uL (ref 0.7–4.0)
LYMPHS PCT: 19 % (ref 12–46)
MCH: 26.6 pg (ref 26.0–34.0)
MCHC: 34.5 g/dL (ref 30.0–36.0)
MCV: 77 fL — ABNORMAL LOW (ref 78.0–100.0)
Monocytes Absolute: 0.8 10*3/uL (ref 0.1–1.0)
Monocytes Relative: 10 % (ref 3–12)
Neutro Abs: 5.8 10*3/uL (ref 1.7–7.7)
Neutrophils Relative %: 70 % (ref 43–77)
PLATELETS: 311 10*3/uL (ref 150–400)
RBC: 4.48 MIL/uL (ref 4.22–5.81)
RDW: 15 % (ref 11.5–15.5)
WBC: 8.1 10*3/uL (ref 4.0–10.5)

## 2014-05-22 LAB — BASIC METABOLIC PANEL
ANION GAP: 10 (ref 5–15)
BUN: 6 mg/dL (ref 6–23)
CHLORIDE: 90 mmol/L — AB (ref 96–112)
CO2: 20 mmol/L (ref 19–32)
Calcium: 9.2 mg/dL (ref 8.4–10.5)
Creatinine, Ser: 0.9 mg/dL (ref 0.50–1.35)
GFR, EST NON AFRICAN AMERICAN: 89 mL/min — AB (ref >90)
GLUCOSE: 93 mg/dL (ref 70–99)
Potassium: 4.1 mmol/L (ref 3.5–5.1)
Sodium: 120 mmol/L — ABNORMAL LOW (ref 135–145)

## 2014-05-22 LAB — I-STAT TROPONIN, ED: Troponin i, poc: 0 ng/mL (ref 0.00–0.08)

## 2014-05-22 LAB — TROPONIN I: Troponin I: <0.03 ng/mL (ref <0.031)

## 2014-05-22 LAB — MRSA PCR SCREENING: MRSA by PCR: NEGATIVE

## 2014-05-22 LAB — OSMOLALITY: OSMOLALITY: 252 mosm/kg — AB (ref 275–300)

## 2014-05-22 LAB — OSMOLALITY, URINE: Osmolality, Ur: 179 mOsm/kg — ABNORMAL LOW (ref 390–1090)

## 2014-05-22 LAB — MAGNESIUM: Magnesium: 1.9 mg/dL (ref 1.5–2.5)

## 2014-05-22 LAB — HEPARIN LEVEL (UNFRACTIONATED): HEPARIN UNFRACTIONATED: 0.29 [IU]/mL — AB (ref 0.30–0.70)

## 2014-05-22 LAB — SODIUM, URINE, RANDOM: SODIUM UR: 32 mmol/L

## 2014-05-22 LAB — PHOSPHORUS: Phosphorus: 3.8 mg/dL (ref 2.3–4.6)

## 2014-05-22 MED ORDER — BUPROPION HCL ER (XL) 300 MG PO TB24
300.0000 mg | ORAL_TABLET | Freq: Every day | ORAL | Status: DC
  Administered 2014-05-23: 300 mg via ORAL
  Filled 2014-05-22: qty 1

## 2014-05-22 MED ORDER — METOPROLOL TARTRATE 25 MG PO TABS
25.0000 mg | ORAL_TABLET | Freq: Every day | ORAL | Status: DC
  Administered 2014-05-22: 25 mg via ORAL
  Filled 2014-05-22 (×2): qty 1

## 2014-05-22 MED ORDER — ACETAMINOPHEN 325 MG PO TABS
650.0000 mg | ORAL_TABLET | ORAL | Status: DC | PRN
  Administered 2014-05-22 – 2014-05-23 (×3): 650 mg via ORAL
  Filled 2014-05-22 (×3): qty 2

## 2014-05-22 MED ORDER — ASPIRIN EC 325 MG PO TBEC
325.0000 mg | DELAYED_RELEASE_TABLET | Freq: Every morning | ORAL | Status: DC
  Administered 2014-05-23: 325 mg via ORAL
  Filled 2014-05-22: qty 1

## 2014-05-22 MED ORDER — ATORVASTATIN CALCIUM 40 MG PO TABS
40.0000 mg | ORAL_TABLET | Freq: Every day | ORAL | Status: DC
Start: 2014-05-23 — End: 2014-05-23
  Administered 2014-05-23: 40 mg via ORAL
  Filled 2014-05-22: qty 1

## 2014-05-22 MED ORDER — GI COCKTAIL ~~LOC~~: 30.0000 mL | Freq: Four times a day (QID) | ORAL | Status: DC | PRN

## 2014-05-22 MED ORDER — SODIUM CHLORIDE 0.9 % IV BOLUS (SEPSIS)
500.0000 mL | Freq: Once | INTRAVENOUS | Status: AC
  Administered 2014-05-22: 500 mL via INTRAVENOUS

## 2014-05-22 MED ORDER — ADULT MULTIVITAMIN W/MINERALS CH
1.0000 | ORAL_TABLET | Freq: Every day | ORAL | Status: DC
  Administered 2014-05-22 – 2014-05-23 (×2): 1 via ORAL
  Filled 2014-05-22 (×2): qty 1

## 2014-05-22 MED ORDER — FOLIC ACID 1 MG PO TABS
1.0000 mg | ORAL_TABLET | Freq: Every day | ORAL | Status: DC
  Administered 2014-05-22 – 2014-05-23 (×2): 1 mg via ORAL
  Filled 2014-05-22 (×2): qty 1

## 2014-05-22 MED ORDER — LORAZEPAM 1 MG PO TABS
1.0000 mg | ORAL_TABLET | Freq: Four times a day (QID) | ORAL | Status: DC | PRN
  Administered 2014-05-23: 1 mg via ORAL
  Filled 2014-05-22: qty 1

## 2014-05-22 MED ORDER — HEPARIN BOLUS VIA INFUSION
4000.0000 [IU] | Freq: Once | INTRAVENOUS | Status: AC
  Administered 2014-05-22: 4000 [IU] via INTRAVENOUS
  Filled 2014-05-22: qty 4000

## 2014-05-22 MED ORDER — MORPHINE SULFATE 2 MG/ML IJ SOLN
2.0000 mg | INTRAMUSCULAR | Status: DC | PRN
  Administered 2014-05-22 (×2): 2 mg via INTRAVENOUS
  Filled 2014-05-22 (×2): qty 1

## 2014-05-22 MED ORDER — ONDANSETRON HCL 4 MG/2ML IJ SOLN
4.0000 mg | Freq: Once | INTRAMUSCULAR | Status: AC
  Administered 2014-05-22: 4 mg via INTRAVENOUS
  Filled 2014-05-22: qty 2

## 2014-05-22 MED ORDER — HEPARIN (PORCINE) IN NACL 100-0.45 UNIT/ML-% IJ SOLN
1150.0000 [IU]/h | INTRAMUSCULAR | Status: DC
  Administered 2014-05-22: 1000 [IU]/h via INTRAVENOUS
  Administered 2014-05-23: 1150 [IU]/h via INTRAVENOUS
  Filled 2014-05-22 (×3): qty 250

## 2014-05-22 MED ORDER — LORAZEPAM 2 MG/ML IJ SOLN: 2.0000 mg | INTRAMUSCULAR | Status: DC | PRN

## 2014-05-22 MED ORDER — NITROGLYCERIN 0.4 MG SL SUBL
0.4000 mg | SUBLINGUAL_TABLET | SUBLINGUAL | Status: DC | PRN
  Administered 2014-05-22: 0.4 mg via SUBLINGUAL
  Filled 2014-05-22: qty 1

## 2014-05-22 MED ORDER — ONDANSETRON HCL 4 MG/2ML IJ SOLN
4.0000 mg | Freq: Four times a day (QID) | INTRAMUSCULAR | Status: DC | PRN
  Administered 2014-05-23: 4 mg via INTRAVENOUS
  Filled 2014-05-22: qty 2

## 2014-05-22 MED ORDER — NITROGLYCERIN IN D5W 200-5 MCG/ML-% IV SOLN
0.0000 ug/min | INTRAVENOUS | Status: DC
  Administered 2014-05-22: 5 ug/min via INTRAVENOUS
  Filled 2014-05-22: qty 250

## 2014-05-22 MED ORDER — THIAMINE HCL 100 MG/ML IJ SOLN
100.0000 mg | Freq: Every day | INTRAMUSCULAR | Status: DC
  Filled 2014-05-22: qty 2
  Filled 2014-05-22: qty 1
  Filled 2014-05-22: qty 2

## 2014-05-22 MED ORDER — LORAZEPAM 1 MG PO TABS: 0.0000 mg | ORAL_TABLET | Freq: Four times a day (QID) | ORAL | Status: DC

## 2014-05-22 MED ORDER — FOLIC ACID 5 MG/ML IJ SOLN: 1.0000 mg | Freq: Every day | INTRAMUSCULAR | Status: DC

## 2014-05-22 MED ORDER — QUETIAPINE FUMARATE ER 50 MG PO TB24
250.0000 mg | ORAL_TABLET | Freq: Every day | ORAL | Status: DC
  Administered 2014-05-22: 250 mg via ORAL
  Filled 2014-05-22 (×2): qty 1

## 2014-05-22 MED ORDER — VITAMIN B-1 100 MG PO TABS: 100.0000 mg | ORAL_TABLET | Freq: Every day | ORAL | Status: DC

## 2014-05-22 MED ORDER — ASPIRIN 81 MG PO CHEW: 324.0000 mg | CHEWABLE_TABLET | Freq: Once | ORAL | Status: DC

## 2014-05-22 MED ORDER — MORPHINE SULFATE 4 MG/ML IJ SOLN
4.0000 mg | Freq: Once | INTRAMUSCULAR | Status: AC
  Administered 2014-05-22: 4 mg via INTRAVENOUS
  Filled 2014-05-22: qty 1

## 2014-05-22 MED ORDER — VITAMIN B-1 100 MG PO TABS
100.0000 mg | ORAL_TABLET | Freq: Every day | ORAL | Status: DC
Start: 2014-05-22 — End: 2014-05-23
  Administered 2014-05-22 – 2014-05-23 (×2): 100 mg via ORAL
  Filled 2014-05-22 (×2): qty 1

## 2014-05-22 MED ORDER — LORAZEPAM 1 MG PO TABS: 0.0000 mg | ORAL_TABLET | Freq: Two times a day (BID) | ORAL | Status: DC

## 2014-05-22 MED ORDER — THIAMINE HCL 100 MG/ML IJ SOLN: 100.0000 mg | Freq: Every day | INTRAMUSCULAR | Status: DC

## 2014-05-22 MED ORDER — LORAZEPAM 2 MG/ML IJ SOLN: 1.0000 mg | Freq: Four times a day (QID) | INTRAMUSCULAR | Status: DC | PRN
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
## 2014-05-22 NOTE — Progress Notes (Signed)
ANTICOAGULATION CONSULT NOTE   Pharmacy Consult for Heparin Indication: chest pain/ACS  Allergies  Allergen Reactions  . Ambien [Zolpidem Tartrate] Other (See Comments)    Sleep Walking  . Codeine Itching    Patient Measurements: Height: 5\' 10"  (177.8 cm) Weight: 179 lb (81.194 kg) IBW/kg (Calculated) : 73 Heparin Dosing Weight: 81 kg  Vital Signs: Temp: 98.3 F (36.8 C) (04/29 1232) Temp Source: Oral (04/29 1232) BP: 98/68 mmHg (04/29 2200) Pulse Rate: 70 (04/29 2200)  Labs:  Recent Labs  05/22/14 1447 05/22/14 2153  HGB 11.9*  --   HCT 34.5*  --   PLT 311  --   HEPARINUNFRC  --  0.29*  CREATININE 0.90  --     Estimated Creatinine Clearance: 87.9 mL/min (by C-G formula based on Cr of 0.9).   Assessment: 64yo male with history of CAD, MVP, HTN, HLD and bipolar d/o presents with chest pain. Pharmacy is consulted to dose heparin for ACS/chest pain.   Initial heparin level = 0.29  Goal of Therapy:  Heparin level 0.3-0.7 units/ml Monitor platelets by anticoagulation protocol: Yes   Plan:  Increase heparin to 1150 units / hr Follow up AM labs  Thank you. Okey RegalLisa Jalena Vanderlinden, PharmD [phone] 05/22/2014,10:34 PM

## 2014-05-22 NOTE — Consult Note (Signed)
History and Physical   Patient ID: CAMILA NORVILLE MRN: [id number], DOB/AGE: 02/01/1950 64 y.o. Date of Encounter: 05/22/2014  Primary Physician: Thayer Headings, MD Primary Cardiologist: Dr. Swaziland  Chief Complaint:  Chest pain  HPI: TYLER CUBIT is a 64 y.o. male with a history of alcohol abuse, coronary artery disease, hypertension, hyperlipidemia, depression, and bipolar disorder. He also has a history of hyponatremia. His sodium has been as low as 110 in the past. He has had percutaneous intervention to his RCA and has a history of mitral valve prolapse. Last Myoview in 2013 was without ischemia, EF 71%.  Mr. Wissner states that he smokes at times, but has not had a drink in several months.  Last p.m., he was in his usual state of health and states he felt panicky. He was feeling the need to breathe deeply and rapidly. He does not know why. There was no cough, fever or chills. He was not lightheaded or dizzy. He is not sure if this was a panic attack or not. Eventually his symptoms eased off and he was able to go to bed.  Patient states he was awakened by chest pain at about 5 AM. He states the pain is worse with deep inspiration and on both sides of his chest. These areas of his chest wall are tender. He was diaphoretic upon waking but denies nausea or vomiting.   The pain was initially a 9/10. When his symptoms did not resolve, he came to the emergency room. He was given one nitroglycerin, but states it did not help. He has had morphine 4 mg and Zofran 4 mg in addition to heparin. Currently his pain is an 8/10.   Past Medical History  Diagnosis Date  . MVP (mitral valve prolapse)   . Anxiety   . Hypertension   . Hyperlipidemia   . Palpitations   . Bipolar disorder   . Depression   . Esophagitis   . Gastritis   . Insomnia   . Hypercholesteremia   . Coronary artery disease     a.  s/p PTCA to PL branch of RCA in 2007;  b.  Last LHC 04/2008:  LM with irregularities, LAD 50%,  mid LAD 50%, proximal D1 80-90%, proximal OM1 30-40%, proximal OM2 50%, PLB branch 30-40%, site of prior angioplasty in the RCA widely patent, EF 65%=>Med Rx;  c. Nuclear study a 12/2011: EF 71% and normal perfusion.    Marland Kitchen History of echocardiogram     Echo 08/16/09: Mild LVH, EF 55-60%, no normal wall motion, grade 1 diastolic dysfunction  . Anginal pain   . Pneumonia 2005  . GERD (gastroesophageal reflux disease)   . Arthritis     "hands; especially in cold weather" (12/11/2013)  . Alcohol related seizure     "when I go off it"    Surgical History:  Past Surgical History  Procedure Laterality Date  . Coronary angioplasty  2007    RCA  . Cardiac catheterization  04/2008    EF 65%  . Knee arthroscopy, medial patello femoral ligament repair    . Hip arthroplasty Bilateral     "both replaced twice"   . Total shoulder replacement Right   . Total hip revision  02/07/2012    Procedure: TOTAL HIP REVISION;  Surgeon: [name], MD;  Location: [facility] OR;  Service: Orthopedics;  Laterality: Left;  [place] Hip Revision Set  . Total knee arthroplasty Right   . Appendectomy  1958  .  Tonsillectomy  1958  . Joint replacement       I have reviewed the patient's current medications. Medication Sig  aspirin EC 325 MG tablet Take 1 tablet (325 mg total) by mouth every morning. For blood thinner for heart health  atorvastatin (LIPITOR) 40 MG tablet Take 1 tablet (40 mg total) by mouth daily. For cholesterol  buPROPion (WELLBUTRIN XL) 300 MG 24 hr tablet Take 1 tablet (300 mg total) by mouth daily. For depression  hydrOXYzine (ATARAX/VISTARIL) 25 MG tablet Take 1 tablet (25 mg total) by mouth every 6 (six) hours as needed for anxiety.  LORazepam (ATIVAN) 1 MG tablet Take 1 tablet (1 mg total) by mouth every 8 (eight) hours as needed (CIWA-AR > 8-OR-withdrawal symptoms:anxiety, agitation, insomnia, diaphoresis, nausea, vomiting, tremors, tachycardia, or hypertension.).  metoprolol tartrate  (LOPRESSOR) 25 MG tablet Take 25 mg by mouth at bedtime.  Multiple Vitamin (MULTIVITAMIN WITH MINERALS) TABS tablet Take 1 tablet by mouth daily.  pantoprazole (PROTONIX) 40 MG tablet Take 1 tablet (40 mg total) by mouth daily. Patient taking differently: Take 40 mg by mouth as needed (reflux/indigestion).   QUEtiapine (SEROQUEL XR) 50 MG TB24 24 hr tablet Take 5 tablets (250 mg total) by mouth at bedtime. Patient taking differently: Take 50-200 mg by mouth at bedtime. Take 1 Tablet (50 mg) in the am and Take 4 Tablets (200 mg) in the pm  cyclobenzaprine (FLEXERIL) 5 MG tablet Take 1 tablet (5 mg total) by mouth 3 (three) times daily as needed for muscle spasms. Patient not taking: Reported on 05/22/2014  folic acid (FOLVITE) 1 MG tablet Take 1 tablet (1 mg total) by mouth daily. Patient not taking: Reported on 05/22/2014  nitroGLYCERIN (NITROSTAT) 0.4 MG SL tablet Place 1 tablet (0.4 mg total) under the tongue every 5 (five) minutes as needed for chest pain (CP or SOB).  thiamine 100 MG tablet Take 1 tablet (100 mg total) by mouth daily. Patient not taking: Reported on 05/22/2014   Scheduled Meds: . folic acid  1 mg Intravenous Daily  . thiamine  100 mg Intravenous Daily   Continuous Infusions: . heparin 1,000 Units/hr (05/22/14 1521)  . sodium chloride     PRN Meds:.LORazepam, nitroGLYCERIN  Allergies:  Allergies  Allergen Reactions  . Ambien [Zolpidem Tartrate] Other (See Comments)    Sleep Walking  . Codeine Itching   History   Social History  . Marital Status: Single    Spouse Name: N/A  . Number of Children: N/A  . Years of Education: N/A   Occupational History  . Retired Airline pilotaccountant    Social History Main Topics  . Smoking status: Current Some Day Smoker -- 0.50 packs/day for 30 years    Types: Cigarettes  . Smokeless tobacco: Never Used  . Alcohol Use: 0.0 oz/week     Comment: 12/11/2013 "maybe 2-3 beers or a couple mixed drinks q couple weeks"  . Drug Use: 7.00  per week    Special: Benzodiazepines     Comment: "stopped ~ 2005"  . Sexual Activity: No   Other Topics Concern  . Not on file   Social History Narrative   Lives alone    Family History  Problem Relation Age of Onset  . Heart disease Neg Hx   . Prostate cancer Father    Family Status  Relation Status Death Age  . Mother Deceased     Manic depressive; committed suicide  . Father Deceased     metastatic prostate cancer  . Brother  Alive   . Brother Alive     Review of Systems:   Full 14-point review of systems otherwise negative except as noted above.  Physical Exam: Blood pressure 112/76, pulse 72, temperature 98.3 F (36.8 C), temperature source Oral, resp. rate 17, height  (1.778 m), weight 179 lb (81.194 kg), SpO2 97 %. General: Well developed, well nourished,male in no acute distress. Head: Normocephalic, atraumatic, sclera non-icteric, no xanthomas, nares are without discharge. Dentition: Good Neck: No carotid bruits. JVD not elevated. No thyromegally Lungs: Good expansion bilaterally. without wheezes or rhonchi. Clear, chest wall in the mid clavicular area bilaterally is tender Heart: Regular rate and rhythm with S1 S2.  No S3 or S4.  No murmur, no rubs, or gallops appreciated. Abdomen: Soft, tender left upper quadrant, non-distended with normoactive bowel sounds. No hepatomegaly. No rebound/guarding. No obvious abdominal masses. Msk:  Strength and tone appear normal for age. No joint deformities or effusions, no spine or costo-vertebral angle tenderness. Extremities: No clubbing or cyanosis. No edema.  Distal pedal pulses are 2+ in 4 extrem Neuro: Alert and oriented X 3. Moves all extremities spontaneously. No focal deficits noted. Psych:  Responds to questions appropriately with a very anxious affect. Skin: No rashes or lesions noted  Labs:   Lab Results  Component Value Date   WBC 8.1 05/22/2014   HGB 11.9* 05/22/2014   HCT 34.5* 05/22/2014   MCV 77.0*  05/22/2014   PLT 311 05/22/2014     Recent Labs Lab 05/22/14 1447  NA 120*  K 4.1  CL 90*  CO2 20  BUN 6  CREATININE 0.90  CALCIUM 9.2  GLUCOSE 93    Recent Labs  05/22/14 1453  TROPIPOC 0.00    Radiology/Studies: Dg Chest Port 1 View 05/22/2014   CLINICAL DATA:  Chest pain  EXAM: PORTABLE CHEST - 1 VIEW  COMPARISON:  03/30/2014  FINDINGS: Heart is upper limits normal in size. Mediastinal contours are within normal limits. No confluent airspace opacities or effusions. No acute bony abnormality. Prior right shoulder replacement.  IMPRESSION: No active disease.   Electronically Signed   By: Charlett Nose M.D.   On: 05/22/2014 14:04    Cardiac Cath: 04/27/2008 FINAL INTERPRETATION: 1. Modest diffuse atherosclerotic coronary artery disease. He does  have a high-grade stenosis and a very small diagonal branch, which  is really not suitable for intervention. Compared to his prior  cardiac catheterization in 2007, there has been some regression of  disease in the marginal branches. There is some mild progression  of disease in the mid left anterior descending. The diagonal  lesion is unchanged. 2. Normal left ventricular function. PLAN: Since the bulk of his disease is within small branches that are not suitable for intervention, we would recommend continued medical therapy with aggressive lipid-lowering therapy, antiplatelet therapy, and smoking cessation.  Echo: 03/31/2014 - Left ventricle: The cavity size was normal. There was moderate focal basal and mild concentric hypertrophy. Systolic function was normal. The estimated ejection fraction was in the range of 60% to 65%. Wall motion was normal; there were no regional wall motion abnormalities. There was an increased relative contribution of atrial contraction to ventricular filling. Doppler parameters are consistent with abnormal left ventricular relaxation (grade 1  diastolic dysfunction).  ECG: 05/22/2014 Sinus rhythm, no acute ischemic changes  ASSESSMENT AND PLAN:  Active Problems:   Chest pain at rest - Initial enzymes are negative, ECG without acute ischemic changes and chest wall is tender with increasing pain  associated with deep inspiration. - He has known CAD and agree with cycling enzymes. However, his pain is atypical and if enzymes remain negative, M.D. advise if any further workup is needed.    Hyponatremia - And other issues will be per the primary admitting team.  Signed, Theodore Demark, PA-C 05/22/2014 4:54 PM Beeper [id number] 64 year old male with past medical history of alcohol abuse, coronary artery disease, hypertension, hyperlipidemia, bipolar disorder, chronic hyponatremia for evaluation of chest pain. Patient has had previous PCI of his right coronary artery in 2007. Last nuclear study in 2013 showed an ejection fraction of 71% and no ischemia. Recent echocardiogram March 2016 showed normal LV function and grade 1 diastolic dysfunction. Patient states he began having chest pain yesterday as a tight feeling. This morning he awoke at 4 AM with chest pressure. The pain does not radiate. It increases with cough. There is nausea and dyspnea but no diaphoresis. The pain has been continuous since early this morning. Electrocardiogram shows sinus rhythm, first-degree AV block and no ST changes. Initial enzymes negative.  Symptoms are atypical. His chest pain is reproduced with palpation. Most likely musculoskeletal. Would rule out myocardial infarction with serial enzymes. If negative would not pursue further ischemia evaluation. Continue aspirin, statin and beta blocker. Follow-up with Dr. Swaziland for cardiac issues. Primary team to evaluate hyponatremia; he is anxious and tremulous; ? Early withdrawal. However patient states he has not consumed alcohol in 2 months. Olga Millers
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
## 2014-05-22 NOTE — ED Notes (Signed)
Pt is difficult stick for blood work. RN and Tech have attempted. Phlebotomy made aware and will attempt to get bloodwork.

## 2014-05-22 NOTE — ED Notes (Signed)
Per EMS pt from home with c/o central chest pressure since last night. EKG normal. 18 G LAC. BP 93/64. Given ASA 324 in route. No nitro given. Cardiac hx.

## 2014-05-22 NOTE — Progress Notes (Signed)
ANTICOAGULATION CONSULT NOTE - Initial Consult  Pharmacy Consult for Heparin Indication: chest pain/ACS  Allergies  Allergen Reactions  . Ambien [Zolpidem Tartrate] Other (See Comments)    Sleep Walking  . Codeine Itching    Patient Measurements: Height: 5\' 10"  (177.8 cm) Weight: 179 lb (81.194 kg) IBW/kg (Calculated) : 73 Heparin Dosing Weight: 81 kg  Vital Signs: Temp: 98.3 F (36.8 C) (04/29 1232) Temp Source: Oral (04/29 1232) BP: 106/76 mmHg (04/29 1429) Pulse Rate: 74 (04/29 1429)  Labs: No results for input(s): HGB, HCT, PLT, APTT, LABPROT, INR, HEPARINUNFRC, CREATININE, CKTOTAL, CKMB, TROPONINI in the last 72 hours.  CrCl cannot be calculated (Unknown ideal weight.).   Medical History: Past Medical History  Diagnosis Date  . MVP (mitral valve prolapse)   . Anxiety   . Hypertension   . Hyperlipidemia   . Palpitations   . Bipolar disorder   . Depression   . Esophagitis   . Gastritis   . Insomnia   . Hypercholesteremia   . Coronary artery disease     a.  s/p PCI to RCA in 2007;  b.  Last LHC 04/2008:  LM with irregularities, LAD 50%, mid LAD 50%, proximal D1 80-90%, proximal OM1 30-40%, proximal OM2 50%, PLB branch 30-40%, site of prior angioplasty in the RCA widely patent, EF 65%=>Med Rx;  c. Nuclear study a 12/2011: EF 71% and normal perfusion.    Marland Kitchen. History of echocardiogram     Echo 08/16/09: Mild LVH, EF 55-60%, no normal wall motion, grade 1 diastolic dysfunction  . Anginal pain   . Pneumonia 2005  . GERD (gastroesophageal reflux disease)   . Arthritis     "hands; especially in cold weather" (12/11/2013)  . Alcohol related seizure     "when I go off it"    Medications:   (Not in a hospital admission) Scheduled:   Infusions:    Assessment: 64yo male with history of CAD, MVP, HTN, HLD and bipolar d/o presents with chest pain. Pharmacy is consulted to dose heparin for ACS/chest pain. Labs are pending.  Goal of Therapy:  Heparin level 0.3-0.7  units/ml Monitor platelets by anticoagulation protocol: Yes   Plan:  Give 4000 units bolus x 1 Start heparin infusion at 1000 units/hr Check anti-Xa level in 6 hours and daily while on heparin Continue to monitor H&H and platelets  Arlean Hoppingorey M. Newman PiesBall, PharmD Clinical Pharmacist Pager [phone] 05/22/2014,2:44 PM

## 2014-05-23 ENCOUNTER — Emergency Department (HOSPITAL_COMMUNITY)
Admission: EM | Admit: 2014-05-23 | Discharge: 2014-05-23 | Disposition: A | Discharge status: Home / Self Care | Payer: Medicare Other | Attending: Emergency Medicine | Admitting: Emergency Medicine

## 2014-05-23 ENCOUNTER — Encounter (HOSPITAL_COMMUNITY): Payer: Self-pay | Admitting: Emergency Medicine

## 2014-05-23 DIAGNOSIS — G4700 Insomnia, unspecified: Secondary | ICD-10-CM | POA: Insufficient documentation

## 2014-05-23 DIAGNOSIS — Z9889 Other specified postprocedural states: Secondary | ICD-10-CM | POA: Insufficient documentation

## 2014-05-23 DIAGNOSIS — F319 Bipolar disorder, unspecified: Secondary | ICD-10-CM | POA: Insufficient documentation

## 2014-05-23 DIAGNOSIS — Z9861 Coronary angioplasty status: Secondary | ICD-10-CM | POA: Insufficient documentation

## 2014-05-23 DIAGNOSIS — F1010 Alcohol abuse, uncomplicated: Secondary | ICD-10-CM | POA: Diagnosis not present

## 2014-05-23 DIAGNOSIS — Z8701 Personal history of pneumonia (recurrent): Secondary | ICD-10-CM | POA: Insufficient documentation

## 2014-05-23 DIAGNOSIS — K219 Gastro-esophageal reflux disease without esophagitis: Secondary | ICD-10-CM | POA: Insufficient documentation

## 2014-05-23 DIAGNOSIS — M1990 Unspecified osteoarthritis, unspecified site: Secondary | ICD-10-CM | POA: Insufficient documentation

## 2014-05-23 DIAGNOSIS — I10 Essential (primary) hypertension: Secondary | ICD-10-CM | POA: Insufficient documentation

## 2014-05-23 DIAGNOSIS — Z720 Tobacco use: Secondary | ICD-10-CM | POA: Insufficient documentation

## 2014-05-23 DIAGNOSIS — Z7982 Long term (current) use of aspirin: Secondary | ICD-10-CM | POA: Insufficient documentation

## 2014-05-23 DIAGNOSIS — E785 Hyperlipidemia, unspecified: Secondary | ICD-10-CM

## 2014-05-23 DIAGNOSIS — F419 Anxiety disorder, unspecified: Secondary | ICD-10-CM | POA: Insufficient documentation

## 2014-05-23 DIAGNOSIS — Z79899 Other long term (current) drug therapy: Secondary | ICD-10-CM | POA: Insufficient documentation

## 2014-05-23 DIAGNOSIS — F10220 Alcohol dependence with intoxication, uncomplicated: Secondary | ICD-10-CM

## 2014-05-23 DIAGNOSIS — I25119 Atherosclerotic heart disease of native coronary artery with unspecified angina pectoris: Secondary | ICD-10-CM | POA: Insufficient documentation

## 2014-05-23 LAB — BASIC METABOLIC PANEL
ANION GAP: 8 (ref 5–15)
BUN: 9 mg/dL (ref 6–23)
CALCIUM: 9.7 mg/dL (ref 8.4–10.5)
CHLORIDE: 94 mmol/L — AB (ref 96–112)
CO2: 23 mmol/L (ref 19–32)
CREATININE: 1.09 mg/dL (ref 0.50–1.35)
GFR calc Af Amer: 82 mL/min — ABNORMAL LOW (ref >90)
GFR calc non Af Amer: 71 mL/min — ABNORMAL LOW (ref >90)
GLUCOSE: 86 mg/dL (ref 70–99)
Potassium: 4.7 mmol/L (ref 3.5–5.1)
Sodium: 125 mmol/L — ABNORMAL LOW (ref 135–145)

## 2014-05-23 LAB — CBC
HEMATOCRIT: 32.2 % — AB (ref 39.0–52.0)
Hemoglobin: 10.8 g/dL — ABNORMAL LOW (ref 13.0–17.0)
MCH: 26.1 pg (ref 26.0–34.0)
MCHC: 33.5 g/dL (ref 30.0–36.0)
MCV: 77.8 fL — AB (ref 78.0–100.0)
Platelets: 285 10*3/uL (ref 150–400)
RBC: 4.14 MIL/uL — ABNORMAL LOW (ref 4.22–5.81)
RDW: 15.3 % (ref 11.5–15.5)
WBC: 7 10*3/uL (ref 4.0–10.5)

## 2014-05-23 LAB — TROPONIN I: Troponin I: <0.03 ng/mL (ref <0.031)

## 2014-05-23 LAB — HEPARIN LEVEL (UNFRACTIONATED): Heparin Unfractionated: 0.29 IU/mL — ABNORMAL LOW (ref 0.30–0.70)

## 2014-05-23 MED ORDER — LORAZEPAM 1 MG PO TABS
1.0000 mg | ORAL_TABLET | Freq: Once | ORAL | Status: AC
  Administered 2014-05-23: 1 mg via ORAL
  Filled 2014-05-23: qty 1

## 2014-05-23 MED ORDER — CHLORDIAZEPOXIDE HCL 25 MG PO CAPS: ORAL_CAPSULE | ORAL | Status: DC

## 2014-05-23 NOTE — Progress Notes (Signed)
Ruled-out for AMI - please refer to Dr. Ludwig Clarksrenshaw's consult note. No further ischemic work-up planned -symptoms felt to be atypical for ischemia. Follow-up with Dr. SwazilandJordan as an outpatient. Cardiology will sign-off. Call with questions.  Chrystie NoseKenneth C. Anda Sobotta, MD, [facility] Attending Cardiologist [facility]

## 2014-05-23 NOTE — Progress Notes (Signed)
DC orders received.  Patient stable with no S/S of distress.  Medication and discharge information reviewed with patient.  Patient awaiting on ride for DC. [place], Mitzi HansenJessica Marie

## 2014-05-23 NOTE — ED Notes (Signed)
Pt arrived to the ED with a complaint of a alcohol problem.  Pt states he wants to go to behavioral health to detox from alcohol and sleep medications.  Pt states his last drink was yesterday morning.  [facility].
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
## 2014-05-23 NOTE — Discharge Instructions (Signed)
°Emergency Department Resource Guide °1) Find a Doctor and Pay Out of Pocket °Although you won't have to find out who is covered by your insurance plan, it is a good idea to ask around and get recommendations. You will then need to call the office and see if the doctor you have chosen will accept you as a new patient and what types of options they offer for patients who are self-pay. Some doctors offer discounts or will set up payment plans for their patients who do not have insurance, but you will need to ask so you aren't surprised when you get to your appointment. ° °2) Contact Your Local Health Department °Not all health departments have doctors that can see patients for sick visits, but many do, so it is worth a call to see if yours does. If you don't know where your local health department is, you can check in your phone book. The CDC also has a tool to help you locate your state's health department, and many state websites also have listings of all of their local health departments. ° °3) Find a Walk-in Clinic °If your illness is not likely to be very severe or complicated, you may want to try a walk in clinic. These are popping up all over the country in pharmacies, drugstores, and shopping centers. They're usually staffed by nurse practitioners or physician assistants that have been trained to treat common illnesses and complaints. They're usually fairly quick and inexpensive. However, if you have serious medical issues or chronic medical problems, these are probably not your best option. ° °No Primary Care Doctor: °- Call Health Connect at  832-8000 - they can help you locate a primary care doctor that  accepts your insurance, provides certain services, etc. °- Physician Referral Service- 1-800-533-3463 ° °Chronic Pain Problems: °Organization         Address  Phone   Notes  °[facility]  (336) 297-2271 Patients need to be referred by their primary care doctor.  ° °Medication  Assistance: °Organization         Address  Phone   Notes  °[place] [facility] [address] °[place], [place] [address] (336) 641-8030 --Must be a resident of [place] °-- Must have NO insurance coverage whatsoever (no Medicaid/ Medicare, etc.) °-- The pt. MUST have a primary care doctor that directs their care regularly and follows them in the community °  °[facility]  (866) 331-1348   °[employer]  (888) 892-1162   ° °Agencies that provide inexpensive medical care: °Organization         Address  Phone   Notes  °[facility]  (336) 832-8035   °[facility]    (336) 832-7272   °[facility] [address] °[place], [place] [address] (336) 832-4777   °[facility] [address], °[place] (336) 271-4999   °[facility]    (336) 373-0678   °[facility]    (336) 272-1050   °[facility] ° [address], [place] Phone:  (336) 832-4444, Fax:  (336) 832-4440 Hours of Operation:  9 am - 6 pm, M-F.  Also accepts Medicaid/Medicare and self-pay.  °[facility] ° [address], [place] Phone: (336) 832-3150, Fax: (336) [phone]. Hours of Operation:  8:30 am - 5:30 pm, M-F.  Also accepts Medicaid and self-pay.  °[facility] [place] [address], [place] Phone: (336) 878-6027   °[facility] [address], [place], [place] (336)723-1848, Ext. 123 Mondays & Thursdays: 7-9 AM.  First 15 patients are seen on a first come, first serve basis. °  ° °Medicaid-accepting [place] Providers: ° °Organization         Address  Phone   Notes  °[facility] [address], [place] (336) 641-2100 Also accepts self-pay patients.  °[facility] [address], [place] ° (336) 856-9996   °[facility] [address], [place]  (336) 288-8857   °[facility] [address] [place] [address], [place] (336) 299-7000   °Veita Bland [address], [place]  ° (336) 373-1557 Only accepts [place] Access Medicaid patients after they have their name applied to their card.  ° °Self-Pay (no insurance) in [place]: ° °Organization         Address  Phone   Notes  °Sickle Cell Patients, [facility] [address], [place] (336) 832-1970   °[facility] [address], [place] (336) 832-4400   °[facility] ° [address], [place] (336) 992-4800   °[facility]/Dr. Osei-Bonsu ° [address] [place] [address], [place] or [address], [place] (336) [phone] Phone number for both [place] and [place] locations is the same.  °[facility] [address], [place] (336) 299-0000   °[facility] [address] [place] [address], [place] or [address] (336) 852-7530 °(336) 878-2260   °[facility] [address], [place] (336) 350-1642, phone; (336) 294-5005, fax Sees patients 1st and 3rd Saturday of every month.  Must not qualify for public or private insurance (i.e. Medicaid, Medicare, [place] Health Choice, Veterans' Benefits) • Household income should be no more than 200% of the poverty level •The clinic cannot treat you if you are pregnant or think you are pregnant • Sexually transmitted diseases are not treated at the clinic.  ° ° °Dental Care: °Organization         Address  Phone  Notes  °[place] [facility] [address],  (336) 641-6152 Accepts children up to age 21 who are enrolled in Medicaid or [place] Health Choice; pregnant women with a Medicaid card; and children who have applied for Medicaid or [place] Health Choice, but were declined, whose parents can pay a reduced fee at time of service.  °[place]  [facility] [place]  [address], [place] (336) 641-7733 Accepts children up to age 21 who are enrolled in Medicaid or [place] Health Choice; pregnant women with a Medicaid card; and children who have applied for Medicaid or [place] Health Choice, but were declined, whose parents can pay a reduced fee at time of service.  °[facility] ° [address],  (336) 641-4533 Patients are seen by appointment only. Walk-ins are not accepted. [facility] will see patients 18 years of age and older. °Monday - Tuesday (8am-5pm) °Most Wednesdays (8:30-5pm) °$30 per visit, cash only  °[facility] ° [address], [place] (336) 641-4533 Patients are seen by appointment only. Walk-ins are not accepted. [facility] will see patients 18 years of age and older. °One   Wednesday Evening (Monthly: Volunteer Based).  $30 per visit, cash only  °[facility]  (919) 537-3737 for adults; Children under age 4, call Graduate Pediatric Dentistry at (919) [phone]. Children aged 4-14, please call (919) [phone] to request a pediatric application. ° Dental services are provided in all areas of dental care including fillings, crowns and bridges, complete and partial dentures, implants, gum treatment, root canals, and extractions. Preventive care is also provided. Treatment is provided to both adults and children. °Patients are selected via a lottery and there is often a waiting list. °  °[facility] [address], °[place] ° (336) 763-8833 [email or web address] °  °[facility] [address], [place], [place] (336)723-1848, Ext. 123 Second and Fourth Thursday of each month, opens at 6:30 AM; Clinic ends at 9 AM.  Patients are seen on a first-come first-served basis, and a limited number are seen during each clinic.  ° °[facility] ° [address], [place], [place] (336) 723-7904    Eligibility Requirements °You must have lived in [place], [place], or [place] counties for at least the last three months. °  You cannot be eligible for state or federal sponsored healthcare insurance, including Veterans Administration, Medicaid, or Medicare. °  You generally cannot be eligible for healthcare insurance through your employer.  °  How to apply: °Eligibility screenings are held every Tuesday and Wednesday afternoon from 1:00 pm until 4:00 pm. You do not need an appointment for the interview!  °[facility] [address], [place], [place] 336-631-2330   °[facility] [facility]  336-342-8273   °[place]  336-703-3100   °[facility]  336-570-6415   ° °Behavioral Health Resources in the Community: °Intensive Outpatient Programs °Organization         Address  Phone  Notes  °[facility] [address], [place], [place] 336-878-6098   °[facility] [address], [place], [place] 336-832-9800   °[facility] [address], [place], [place] ° 336-882-2125   °[facility] [address],  °[place], [place] 1-800-853-5163 or 336-641-4981   °Substance Abuse Resources °Organization         Address  Phone  Notes  °[facility]  336-882-2125   °[facility]  336-784-9470   °[facility]  336-285-9073   °[facility]  336-845-3988   °Residential & Outpatient Substance Abuse Program  1-800-659-3381   °Psychological Services °Organization         Address  Phone  Notes  °[facility]  336- 832-9600   °[facility]  336- 378-7881   °[facility] [address], [place] 1-800-853-5163 or 336-641-4981   ° °Mobile Crisis Teams °Organization         Address  Phone  Notes  °[facility], Mobile Crisis Care Unit  1-877-626-1772   °Assertive °[facility] ° [address], [place] 336-834-9664   °Sharon DeEsch [address] ° [place] 336-554-5454   ° °Self-Help/Support Groups °Organization         Address  Phone             Notes  °[facility]  - variety of support groups  336- 373-1402 Call for more information  °Narcotics Anonymous (NA), [facility] [address], °[place]   2 meetings at this location  ° °  Residential Treatment Programs °Organization         Address  Phone  Notes  °[facility] [address],    °[place]  1-866-801-8205   °[facility] ° [address], [place], [place] 704-293-8524   °[facility] [address], [place] 336-845-3988 Admissions: 8am-3pm M-F  °[facility] [address]    °[place], [place] 336-841-1104   °[facility] [address], [place], [place] 336-379-7146   °[facility] [address]  °[place], [place] 336-285-9073   °[facility] Intensive Outpatient [address], [place], [place] 336-852-3033   °[facility] [address]  °[place], [place] 1-877-615-2722 or 336-784-9470   °[facility] ([facility] [address] [place], [place] 336-227-7417 Accepts Medicaid  °[facility] [address]  °[place] 1-800-659-3381 Substance Abuse/Addiction Treatment  ° °[place] Behavioral Health Resources °Organization         Address  Phone  Notes  °[facility]  (888) 581-9988   °Julie Brannon, PhD [address] [place], [place]   (336) 349-5553 or (336) 951-0000   °[facility]   [address] °[place], [place] (336) 349-4454   °[facility] [address], [place], [place] (336) 342-8316 Insurance/Medicaid/sponsorship through [facility]  °[facility] [address]                                    [place],  (336) 342-8316 Therapy/tele-psych/case    °[facility] [address]  ° [place],  (336) 349-2233    °Dr. Arfeen  (336) 349-4544   °[facility] [place]  [employer] [place] [facility] 1) [address], [place] °2) [address], [place] °3)  [address], [place] (336) 349-3220 °(336) 342-7768 ° °(336) 342-8140   °[place] Child Abuse Hotline (336) 342-1394 or (336) 342-3537 (After Hours)    ° ° °

## 2014-05-24 NOTE — ED Provider Notes (Signed)
CSN: [id number]     Arrival date & time 05/23/14  2052 History   First MD Initiated Contact with Patient 05/23/14 2115     Chief Complaint  Patient presents with  . Alcohol Problem    HPI Patient presents to emergency department today complaining of needing help with alcohol abuse.  He states his been on a binge for several weeks and has a prior history of alcohol abuse.  He is requesting admission to [facility] for detox.  No homicidal or suicidal thoughts.  No other complaints at this time.  He is here with his friend.   Past Medical History  Diagnosis Date  . MVP (mitral valve prolapse)   . Anxiety   . Hypertension   . Hyperlipidemia   . Palpitations   . Bipolar disorder   . Depression   . Esophagitis   . Gastritis   . Insomnia   . Hypercholesteremia   . Coronary artery disease     a.  s/p PTCA to PL branch of RCA in 2007;  b.  Last LHC 04/2008:  LM with irregularities, LAD 50%, mid LAD 50%, proximal D1 80-90%, proximal OM1 30-40%, proximal OM2 50%, PLB branch 30-40%, site of prior angioplasty in the RCA widely patent, EF 65%=>Med Rx;  c. Nuclear study a 12/2011: EF 71% and normal perfusion.    Marland Kitchen. History of echocardiogram     Echo 08/16/09: Mild LVH, EF 55-60%, no normal wall motion, grade 1 diastolic dysfunction  . Anginal pain   . Pneumonia 2005  . GERD (gastroesophageal reflux disease)   . Arthritis     "hands; especially in cold weather" (12/11/2013)  . 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 Coronary angioplasty  2007    RCA  . Cardiac catheterization  04/2008    EF 65%  . Knee arthroscopy, medial patello femoral ligament repair    . Hip arthroplasty Bilateral     "both replaced twice"   . Total shoulder replacement Right   . Total hip revision  02/07/2012    Procedure: TOTAL HIP REVISION;  Surgeon: Nestor LewandowskyFrank J Rowan, MD;  Location: [facility] OR;  Service: Orthopedics;  Laterality: Left;  [place] Hip  Revision Set  . Total knee arthroplasty Right   . Appendectomy  1958  . Tonsillectomy  1958  . Joint replacement     Family History  Problem Relation Age of Onset  . Heart disease Neg Hx   . Prostate cancer Father    History  Substance Use Topics  . Smoking status: Current Some Day Smoker -- 0.50 packs/day for 30 years    Types: Cigarettes  . Smokeless tobacco: Never Used  . Alcohol Use: 0.0 oz/week     Comment: 12/11/2013 "maybe 2-3 beers or a couple mixed drinks q couple weeks"    Review of Systems  All other systems reviewed and are negative.     Allergies  Ambien and Codeine  Home Medications   Prior to Admission medications   Medication Sig Start Date End Date Taking? Authorizing Provider  aspirin EC 325 MG tablet Take 1 tablet (325 mg total) by mouth every morning. For blood thinner for heart health 04/02/14  Yes Alison MurrayAlma M Devine, MD  atorvastatin (LIPITOR) 40 MG tablet Take 1 tablet (40 mg total) by mouth daily. For cholesterol 10/24/12  Yes Sanjuana KavaAgnes I Nwoko, NP  buPROPion (WELLBUTRIN XL) 300 MG 24  hr tablet Take 1 tablet (300 mg total) by mouth daily. For depression 10/24/12  Yes Sanjuana Kava, NP  hydrOXYzine (ATARAX/VISTARIL) 25 MG tablet Take 1 tablet (25 mg total) by mouth every 6 (six) hours as needed for anxiety. 10/24/12  Yes Sanjuana Kava, NP  LORazepam (ATIVAN) 1 MG tablet Take 1 tablet (1 mg total) by mouth every 8 (eight) hours as needed (CIWA-AR > 8-OR-withdrawal symptoms:anxiety, agitation, insomnia, diaphoresis, nausea, vomiting, tremors, tachycardia, or hypertension.). 04/02/14  Yes Alison Murray, MD  metoprolol tartrate (LOPRESSOR) 25 MG tablet Take 25 mg by mouth at bedtime.   Yes Historical Provider, MD  Multiple Vitamin (MULTIVITAMIN WITH MINERALS) TABS tablet Take 1 tablet by mouth daily. 04/02/14  Yes Alison Murray, MD  pantoprazole (PROTONIX) 40 MG tablet Take 1 tablet (40 mg total) by mouth daily. Patient taking differently: Take 40 mg by mouth as  needed (reflux/indigestion).  04/02/14  Yes Alison Murray, MD  QUEtiapine (SEROQUEL XR) 50 MG TB24 24 hr tablet Take 5 tablets (250 mg total) by mouth at bedtime. Patient taking differently: Take 50-200 mg by mouth at bedtime. Take 1 Tablet (50 mg) in the am and Take 4 Tablets (200 mg) in the pm 08/15/13  Yes Charm Rings, NP  thiamine 100 MG tablet Take 1 tablet (100 mg total) by mouth daily. 04/02/14  Yes Alison Murray, MD  chlordiazePOXIDE (LIBRIUM) 25 MG capsule  PO TID x 1D, then 25-50mg  PO BID X 1D, then 25-50mg  PO QD X 1D 05/23/14   Azalia Bilis, MD  nitroGLYCERIN (NITROSTAT) 0.4 MG SL tablet Place 1 tablet (0.4 mg total) under the tongue every 5 (five) minutes as needed for chest pain (CP or SOB). Patient not taking: Reported on 05/23/2014 10/24/12   Sanjuana Kava, NP   BP 122/75 mmHg  Pulse 65  Temp(Src) 98.3 F (36.8 C) (Oral)  Resp 16  SpO2 100% Physical Exam  Constitutional: He is oriented to person, place, and time. He appears well-developed and well-nourished.  HENT:  Head: Normocephalic and atraumatic.  Eyes: EOM are normal.  Neck: Normal range of motion.  Cardiovascular: Normal rate, regular rhythm, normal heart sounds and intact distal pulses.   Pulmonary/Chest: Effort normal and breath sounds normal. No respiratory distress.  Abdominal: Soft. He exhibits no distension. There is no tenderness.  Musculoskeletal: Normal range of motion.  Neurological: He is alert and oriented to person, place, and time.  Skin: Skin is warm and dry.  Psychiatric: He has a normal mood and affect. Judgment normal.  Nursing note and vitals reviewed.   ED Course  Procedures (including critical care time) Labs Review Labs Reviewed - No data to display  Imaging Review    EKG Interpretation None      MDM   Final diagnoses:  Alcohol abuse    No life-threatening illness.  Medical screening examination completed.  Outpatient resources given for alcohol abuse.  Home on Librium  taper.  Patient understands return to the ER for new or worsening symptoms.  Vital signs are normal.  No signs of withdrawal this time.    Azalia Bilis, MD 05/24/14 [phone]

## 2014-06-12 ENCOUNTER — Encounter (HOSPITAL_COMMUNITY): Payer: Self-pay | Admitting: Emergency Medicine

## 2014-06-12 ENCOUNTER — Inpatient Hospital Stay (HOSPITAL_COMMUNITY)
Admission: EM | Admit: 2014-06-12 | Discharge: 2014-06-16 | DRG: 641 | Disposition: A | Discharge status: Home / Self Care | Payer: Medicare Other | Attending: Internal Medicine | Admitting: Internal Medicine

## 2014-06-12 DIAGNOSIS — K219 Gastro-esophageal reflux disease without esophagitis: Secondary | ICD-10-CM | POA: Diagnosis present

## 2014-06-12 DIAGNOSIS — I2510 Atherosclerotic heart disease of native coronary artery without angina pectoris: Secondary | ICD-10-CM | POA: Diagnosis present

## 2014-06-12 DIAGNOSIS — R079 Chest pain, unspecified: Secondary | ICD-10-CM

## 2014-06-12 DIAGNOSIS — I959 Hypotension, unspecified: Secondary | ICD-10-CM | POA: Diagnosis present

## 2014-06-12 DIAGNOSIS — Z96643 Presence of artificial hip joint, bilateral: Secondary | ICD-10-CM | POA: Diagnosis present

## 2014-06-12 DIAGNOSIS — F329 Major depressive disorder, single episode, unspecified: Secondary | ICD-10-CM

## 2014-06-12 DIAGNOSIS — E780 Pure hypercholesterolemia: Secondary | ICD-10-CM | POA: Diagnosis present

## 2014-06-12 DIAGNOSIS — Z96651 Presence of right artificial knee joint: Secondary | ICD-10-CM | POA: Diagnosis present

## 2014-06-12 DIAGNOSIS — F319 Bipolar disorder, unspecified: Secondary | ICD-10-CM | POA: Diagnosis present

## 2014-06-12 DIAGNOSIS — E871 Hypo-osmolality and hyponatremia: Secondary | ICD-10-CM

## 2014-06-12 DIAGNOSIS — Z886 Allergy status to analgesic agent status: Secondary | ICD-10-CM

## 2014-06-12 DIAGNOSIS — I9589 Other hypotension: Secondary | ICD-10-CM

## 2014-06-12 DIAGNOSIS — F17210 Nicotine dependence, cigarettes, uncomplicated: Secondary | ICD-10-CM | POA: Diagnosis present

## 2014-06-12 DIAGNOSIS — E785 Hyperlipidemia, unspecified: Secondary | ICD-10-CM | POA: Diagnosis present

## 2014-06-12 DIAGNOSIS — N179 Acute kidney failure, unspecified: Secondary | ICD-10-CM | POA: Diagnosis present

## 2014-06-12 DIAGNOSIS — F1020 Alcohol dependence, uncomplicated: Secondary | ICD-10-CM | POA: Diagnosis present

## 2014-06-12 DIAGNOSIS — Z7982 Long term (current) use of aspirin: Secondary | ICD-10-CM

## 2014-06-12 DIAGNOSIS — F1010 Alcohol abuse, uncomplicated: Secondary | ICD-10-CM | POA: Diagnosis present

## 2014-06-12 DIAGNOSIS — E860 Dehydration: Secondary | ICD-10-CM | POA: Diagnosis present

## 2014-06-12 DIAGNOSIS — Z96611 Presence of right artificial shoulder joint: Secondary | ICD-10-CM | POA: Diagnosis present

## 2014-06-12 DIAGNOSIS — I10 Essential (primary) hypertension: Secondary | ICD-10-CM | POA: Diagnosis present

## 2014-06-12 DIAGNOSIS — M1990 Unspecified osteoarthritis, unspecified site: Secondary | ICD-10-CM | POA: Diagnosis present

## 2014-06-12 DIAGNOSIS — I517 Cardiomegaly: Secondary | ICD-10-CM | POA: Diagnosis present

## 2014-06-12 DIAGNOSIS — F419 Anxiety disorder, unspecified: Secondary | ICD-10-CM | POA: Diagnosis present

## 2014-06-12 DIAGNOSIS — F32A Depression, unspecified: Secondary | ICD-10-CM

## 2014-06-12 DIAGNOSIS — I5032 Chronic diastolic (congestive) heart failure: Secondary | ICD-10-CM | POA: Diagnosis present

## 2014-06-12 DIAGNOSIS — Z9861 Coronary angioplasty status: Secondary | ICD-10-CM

## 2014-06-12 DIAGNOSIS — F1910 Other psychoactive substance abuse, uncomplicated: Secondary | ICD-10-CM

## 2014-06-12 LAB — CBC
HCT: 29.6 % — ABNORMAL LOW (ref 39.0–52.0)
Hemoglobin: 10.2 g/dL — ABNORMAL LOW (ref 13.0–17.0)
MCH: 27.1 pg (ref 26.0–34.0)
MCHC: 34.5 g/dL (ref 30.0–36.0)
MCV: 78.5 fL (ref 78.0–100.0)
PLATELETS: 229 10*3/uL (ref 150–400)
RBC: 3.77 MIL/uL — ABNORMAL LOW (ref 4.22–5.81)
RDW: 15.3 % (ref 11.5–15.5)
WBC: 9.5 10*3/uL (ref 4.0–10.5)

## 2014-06-12 LAB — I-STAT TROPONIN, ED: TROPONIN I, POC: 0 ng/mL (ref 0.00–0.08)

## 2014-06-12 MED ORDER — SODIUM CHLORIDE 0.9 % IV SOLN
1000.0000 mL | Freq: Once | INTRAVENOUS | Status: AC
  Administered 2014-06-13: 1000 mL via INTRAVENOUS

## 2014-06-12 MED ORDER — THIAMINE HCL 100 MG/ML IJ SOLN
100.0000 mg | Freq: Once | INTRAMUSCULAR | Status: AC
  Administered 2014-06-13: 100 mg via INTRAVENOUS
  Filled 2014-06-12: qty 2

## 2014-06-12 MED ORDER — SODIUM CHLORIDE 0.9 % IV SOLN: 1000.0000 mL | INTRAVENOUS | Status: DC

## 2014-06-12 NOTE — ED Notes (Signed)
Pt reports mid sternal cp intermittently all day to with sob, nv. Pt skin warm and dry.

## 2014-06-12 NOTE — ED Provider Notes (Signed)
CSN: [id number]     Arrival date & time 06/12/14  2330 History   First MD Initiated Contact with Patient 06/12/14 2354     This chart was scribed for [name] Severinlga Lillyahna Hemberger, MD by Arlan OrganAshley [name], ED Scribe. This patient was seen in room A07C/A07C and the patient's care was started 11:57 PM.   Chief Complaint  Patient presents with  . Chest Pain   The history is provided by the patient. No language interpreter was used.    HPI Comments: [name] is a 64 y.o. male with a PMHx of HTN, hyperlipidemia, bipolar disorder, CAD, GERD who presents to the Emergency Department complaining of intermittent, ongoing chest pain onset tonight. Pt also reports a 2 day history of not eating well along with chills and subjective fever. [name] was recently in the hospital 3 weeks ago for alcohol abuse but also reports chest pain at that time. Friend states pt recently lost his brother and has been depressed in the last few weeks. He believes pt may be self medicating but is unsure. Pt admits to consuming 10 beers and wine prior to arrival. He denies taking any extra doses on his prescribed medications and reports skipping his dose of Seroquel today. PSHx includes coronary angioplasty, cardiac catheterization, Hip arthroplasty, Knee arthroscopy.  Past Medical History  Diagnosis Date  . MVP (mitral valve prolapse)   . Anxiety   . Hypertension   . Hyperlipidemia   . Palpitations   . Bipolar disorder   . Depression   . Esophagitis   . Gastritis   . Insomnia   . Hypercholesteremia   . Coronary artery disease     a.  s/p PTCA to PL branch of RCA in 2007;  b.  Last LHC 04/2008:  LM with irregularities, LAD 50%, mid LAD 50%, proximal D1 80-90%, proximal OM1 30-40%, proximal OM2 50%, PLB branch 30-40%, site of prior angioplasty in the RCA widely patent, EF 65%=>Med Rx;  c. Nuclear study a 12/2011: EF 71% and normal perfusion.    Marland Kitchen. History of echocardiogram     Echo 08/16/09: Mild LVH, EF 55-60%, no normal wall motion, grade 1  diastolic dysfunction  . Anginal pain   . Pneumonia 2005  . GERD (gastroesophageal reflux disease)   . Arthritis     "hands; especially in cold weather" (12/11/2013)  . 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 Coronary angioplasty  2007    RCA  . Cardiac catheterization  04/2008    EF 65%  . Knee arthroscopy, medial patello femoral ligament repair    . Hip arthroplasty Bilateral     "both replaced twice"   . Total shoulder replacement Right   . Total hip revision  02/07/2012    Procedure: TOTAL HIP REVISION;  Surgeon: Nestor LewandowskyFrank J Rowan, MD;  Location: [facility] OR;  Service: Orthopedics;  Laterality: Left;  [place] Hip Revision Set  . Total knee arthroplasty Right   . Appendectomy  1958  . Tonsillectomy  1958  . Joint replacement     Family History  Problem Relation Age of Onset  . Heart disease Neg Hx   . Prostate cancer Father    History  Substance Use Topics  . Smoking status: Current Some Day Smoker -- 0.50 packs/day for 30 years    Types: Cigarettes  . Smokeless tobacco: Never Used  . Alcohol Use: 0.0 oz/week  Comment: 12/11/2013 "maybe 2-3 beers or a couple mixed drinks q couple weeks"    Review of Systems  Constitutional: Positive for fever and chills.  Respiratory: Negative for cough and shortness of breath.   Cardiovascular: Positive for chest pain.  Gastrointestinal: Negative for nausea and vomiting.  Skin: Negative for rash.  Psychiatric/Behavioral: Negative for confusion.      Allergies  Ambien and Codeine  Home Medications   Prior to Admission medications   Medication Sig Start Date End Date Taking? Authorizing Provider  aspirin EC 325 MG tablet Take 1 tablet (325 mg total) by mouth every morning. For blood thinner for heart health 04/02/14   Alison MurrayAlma M Devine, MD  atorvastatin (LIPITOR) 40 MG tablet Take 1 tablet (40 mg total) by mouth daily. For cholesterol 10/24/12   Sanjuana KavaAgnes I Nwoko, NP  buPROPion  (WELLBUTRIN XL) 300 MG 24 hr tablet Take 1 tablet (300 mg total) by mouth daily. For depression 10/24/12   Sanjuana KavaAgnes I Nwoko, NP  chlordiazePOXIDE (LIBRIUM) 25 MG capsule 50mg  PO TID x 1D, then 25-50mg  PO BID X 1D, then 25-50mg  PO QD X 1D 05/23/14   Azalia BilisKevin Campos, MD  hydrOXYzine (ATARAX/VISTARIL) 25 MG tablet Take 1 tablet (25 mg total) by mouth every 6 (six) hours as needed for anxiety. 10/24/12   Sanjuana KavaAgnes I Nwoko, NP  LORazepam (ATIVAN) 1 MG tablet Take 1 tablet (1 mg total) by mouth every 8 (eight) hours as needed (CIWA-AR > 8-OR-withdrawal symptoms:anxiety, agitation, insomnia, diaphoresis, nausea, vomiting, tremors, tachycardia, or hypertension.). 04/02/14   Alison MurrayAlma M Devine, MD  metoprolol tartrate (LOPRESSOR) 25 MG tablet Take 25 mg by mouth at bedtime.    Historical Provider, MD  Multiple Vitamin (MULTIVITAMIN WITH MINERALS) TABS tablet Take 1 tablet by mouth daily. 04/02/14   Alison MurrayAlma M Devine, MD  nitroGLYCERIN (NITROSTAT) 0.4 MG SL tablet Place 1 tablet (0.4 mg total) under the tongue every 5 (five) minutes as needed for chest pain (CP or SOB). Patient not taking: Reported on 05/23/2014 10/24/12   Sanjuana KavaAgnes I Nwoko, NP  pantoprazole (PROTONIX) 40 MG tablet Take 1 tablet (40 mg total) by mouth daily. Patient taking differently: Take 40 mg by mouth as needed (reflux/indigestion).  04/02/14   Alison MurrayAlma M Devine, MD  QUEtiapine (SEROQUEL XR) 50 MG TB24 24 hr tablet Take 5 tablets (250 mg total) by mouth at bedtime. Patient taking differently: Take 50-200 mg by mouth at bedtime. Take 1 Tablet (50 mg) in the am and Take 4 Tablets (200 mg) in the pm 08/15/13   Charm RingsJamison Y Lord, NP  thiamine 100 MG tablet Take 1 tablet (100 mg total) by mouth daily. 04/02/14   Alison MurrayAlma M Devine, MD   Triage Vitals: BP 62/38 mmHg  Pulse 95  Temp(Src) 97.4 F (36.3 C) (Oral)  Resp 18  SpO2 97%    Physical Exam  Constitutional: He is oriented to person, place, and time. He appears well-developed and well-nourished.  Patient is confused, has  difficulty answering questions fully.  Slowed responses to commands  HENT:  Head: Normocephalic and atraumatic.  Nose: Nose normal.  Mouth/Throat: Oropharynx is clear and moist.  Eyes: Conjunctivae and EOM are normal. Pupils are equal, round, and reactive to light.  Pupils are 4 mm and sluggish to respond bilaterally  Neck: Normal range of motion. Neck supple. No JVD present. No tracheal deviation present. No thyromegaly present.  Cardiovascular: Normal rate, regular rhythm and intact distal pulses.  Exam reveals no gallop and no friction rub.   Murmur  heard. Pulmonary/Chest: Effort normal and breath sounds normal. No stridor. No respiratory distress. He has no wheezes. He has no rales. He exhibits no tenderness.  Abdominal: Soft. Bowel sounds are normal. He exhibits no distension and no mass. There is no tenderness. There is no rebound and no guarding.  Musculoskeletal: Normal range of motion. He exhibits no edema or tenderness.  Lymphadenopathy:    He has no cervical adenopathy.  Neurological: He is alert and oriented to person, place, and time. He displays normal reflexes. He exhibits normal muscle tone. Coordination normal.  Skin: Skin is warm and dry. No rash noted. No erythema. No pallor.  Psychiatric: He has a normal mood and affect. His behavior is normal. Judgment and thought content normal.  Nursing note and vitals reviewed.   ED Course  Procedures (including critical care time)  DIAGNOSTIC STUDIES: Oxygen Saturation is 97% on RA, adequate by my interpretation.    COORDINATION OF CARE: 12:02 AM- Will give Narcan, fluids, and thiamine injection. Will order CXR, lipase, heptic function panel, ethanol, i-stat cg4, CBC, BNP, i-stat troponin and BMP.Discussed treatment plan with pt at bedside and pt agreed to plan.     Labs Review Labs Reviewed  CBC - Abnormal; Notable for the following:    RBC 3.77 (*)    Hemoglobin 10.2 (*)    HCT 29.6 (*)    All other components within  normal limits  BASIC METABOLIC PANEL - Abnormal; Notable for the following:    Sodium 108 (*)    Chloride 75 (*)    CO2 15 (*)    BUN 26 (*)    Creatinine, Ser 2.40 (*)    Calcium 8.7 (*)    GFR calc non Af Amer 27 (*)    GFR calc Af Amer 32 (*)    Anion gap 18 (*)    All other components within normal limits  HEPATIC FUNCTION PANEL - Abnormal; Notable for the following:    Total Protein 6.3 (*)    Bilirubin, Direct <0.1 (*)    All other components within normal limits  CBG MONITORING, ED - Abnormal; Notable for the following:    Glucose-Capillary 127 (*)    All other components within normal limits  BRAIN NATRIURETIC PEPTIDE  ETHANOL  LIPASE, BLOOD  URINALYSIS, ROUTINE W REFLEX MICROSCOPIC  URINE RAPID DRUG SCREEN (HOSP PERFORMED)  I-STAT TROPOININ, ED  I-STAT CG4 LACTIC ACID, ED  CBG MONITORING, ED    Imaging Review No results found.   EKG Interpretation   Date/Time:  Friday Jun 12 2014 23:39:20 EDT Ventricular Rate:  96 PR Interval:  240 QRS Duration: 106 QT Interval:  346 QTC Calculation: 437 R Axis:   105 Text Interpretation:  Sinus rhythm with 1st degree A-V block Rightward  axis Borderline ECG Confirmed by Macy Polio  MD, Jezreel Sisk ([id number]) on 06/13/2014  12:15:50 AM      MDM   Final diagnoses:  Chest pain  Polysubstance abuse  Hyponatremia  Depression  Other specified hypotension  Dehydration  Acute kidney injury    64 year old male presents with complaint of chest pain, hypertensive.  He is a very difficult historian.  It is thought that he is intoxicated on alcohol and other substances.  Drug database consult added, patient has recent prescriptions for Percocet, clonazepam, Librium, and Sonata.  Patient reported to nursing staff that he took extra Klonopin to help with sleep.  He reports striking heavily tonight.  His friend reports that he needs detox.  Hospital records reviewed,  patient recently admitted for chest pain, returned the next day for alcohol  abuse.  EKG without ischemia, but does have pronounced T waves.  Patient received IV fluids, blood sugar, low to receive D50.  I personally performed the services described in this documentation, which was scribed in my presence. The recorded information has been reviewed and is accurate.  12:22 AM  Patient has received Narcan.  Blood pressure has improved from 60/40-81/55.  Patient is more awake and alert.  He is asking for pain medicine for his ongoing chest pain.  Patient informed he would not receive narcotic pain medicine at this time aside from aspirin.   Blood pressure continues to improve with IV fluids.  Discuss with hospitalist who will admit to stepdown.    [name] Severin, MD 06/13/14 [phone]

## 2014-06-13 ENCOUNTER — Encounter (HOSPITAL_COMMUNITY): Payer: Self-pay | Admitting: *Deleted

## 2014-06-13 ENCOUNTER — Emergency Department (HOSPITAL_COMMUNITY): Payer: Medicare Other

## 2014-06-13 DIAGNOSIS — I25119 Atherosclerotic heart disease of native coronary artery with unspecified angina pectoris: Secondary | ICD-10-CM | POA: Diagnosis not present

## 2014-06-13 DIAGNOSIS — E871 Hypo-osmolality and hyponatremia: Secondary | ICD-10-CM

## 2014-06-13 DIAGNOSIS — F1010 Alcohol abuse, uncomplicated: Secondary | ICD-10-CM | POA: Diagnosis not present

## 2014-06-13 DIAGNOSIS — I2510 Atherosclerotic heart disease of native coronary artery without angina pectoris: Secondary | ICD-10-CM | POA: Diagnosis present

## 2014-06-13 DIAGNOSIS — F419 Anxiety disorder, unspecified: Secondary | ICD-10-CM | POA: Diagnosis present

## 2014-06-13 DIAGNOSIS — I10 Essential (primary) hypertension: Secondary | ICD-10-CM

## 2014-06-13 DIAGNOSIS — Z886 Allergy status to analgesic agent status: Secondary | ICD-10-CM | POA: Diagnosis not present

## 2014-06-13 DIAGNOSIS — F17210 Nicotine dependence, cigarettes, uncomplicated: Secondary | ICD-10-CM | POA: Diagnosis present

## 2014-06-13 DIAGNOSIS — E780 Pure hypercholesterolemia: Secondary | ICD-10-CM | POA: Diagnosis present

## 2014-06-13 DIAGNOSIS — N179 Acute kidney failure, unspecified: Secondary | ICD-10-CM

## 2014-06-13 DIAGNOSIS — F1910 Other psychoactive substance abuse, uncomplicated: Secondary | ICD-10-CM | POA: Diagnosis not present

## 2014-06-13 DIAGNOSIS — Z96651 Presence of right artificial knee joint: Secondary | ICD-10-CM | POA: Diagnosis present

## 2014-06-13 DIAGNOSIS — E860 Dehydration: Secondary | ICD-10-CM | POA: Diagnosis present

## 2014-06-13 DIAGNOSIS — I959 Hypotension, unspecified: Secondary | ICD-10-CM | POA: Diagnosis present

## 2014-06-13 DIAGNOSIS — I5032 Chronic diastolic (congestive) heart failure: Secondary | ICD-10-CM | POA: Diagnosis present

## 2014-06-13 DIAGNOSIS — R079 Chest pain, unspecified: Secondary | ICD-10-CM

## 2014-06-13 DIAGNOSIS — Z96611 Presence of right artificial shoulder joint: Secondary | ICD-10-CM | POA: Diagnosis present

## 2014-06-13 DIAGNOSIS — E785 Hyperlipidemia, unspecified: Secondary | ICD-10-CM | POA: Diagnosis present

## 2014-06-13 DIAGNOSIS — F1020 Alcohol dependence, uncomplicated: Secondary | ICD-10-CM | POA: Diagnosis present

## 2014-06-13 DIAGNOSIS — M1990 Unspecified osteoarthritis, unspecified site: Secondary | ICD-10-CM | POA: Diagnosis present

## 2014-06-13 DIAGNOSIS — Z9861 Coronary angioplasty status: Secondary | ICD-10-CM | POA: Diagnosis not present

## 2014-06-13 DIAGNOSIS — K219 Gastro-esophageal reflux disease without esophagitis: Secondary | ICD-10-CM | POA: Diagnosis present

## 2014-06-13 DIAGNOSIS — Z96643 Presence of artificial hip joint, bilateral: Secondary | ICD-10-CM | POA: Diagnosis present

## 2014-06-13 DIAGNOSIS — Z7982 Long term (current) use of aspirin: Secondary | ICD-10-CM | POA: Diagnosis not present

## 2014-06-13 DIAGNOSIS — I9589 Other hypotension: Secondary | ICD-10-CM | POA: Diagnosis present

## 2014-06-13 DIAGNOSIS — F319 Bipolar disorder, unspecified: Secondary | ICD-10-CM | POA: Diagnosis present

## 2014-06-13 LAB — URINALYSIS, ROUTINE W REFLEX MICROSCOPIC
BILIRUBIN URINE: NEGATIVE
GLUCOSE, UA: NEGATIVE mg/dL
HGB URINE DIPSTICK: NEGATIVE
Ketones, ur: NEGATIVE mg/dL
Leukocytes, UA: NEGATIVE
Nitrite: NEGATIVE
Protein, ur: NEGATIVE mg/dL
SPECIFIC GRAVITY, URINE: 1.012 (ref 1.005–1.030)
UROBILINOGEN UA: 0.2 mg/dL (ref 0.0–1.0)
pH: 5 (ref 5.0–8.0)

## 2014-06-13 LAB — BASIC METABOLIC PANEL
ANION GAP: 18 — AB (ref 5–15)
Anion gap: 11 (ref 5–15)
BUN: 22 mg/dL — ABNORMAL HIGH (ref 6–20)
BUN: 26 mg/dL — ABNORMAL HIGH (ref 6–20)
CO2: 15 mmol/L — ABNORMAL LOW (ref 22–32)
CO2: 17 mmol/L — ABNORMAL LOW (ref 22–32)
Calcium: 8.2 mg/dL — ABNORMAL LOW (ref 8.9–10.3)
Calcium: 8.7 mg/dL — ABNORMAL LOW (ref 8.9–10.3)
Chloride: 75 mmol/L — ABNORMAL LOW (ref 101–111)
Chloride: 88 mmol/L — ABNORMAL LOW (ref 101–111)
Creatinine, Ser: 1.65 mg/dL — ABNORMAL HIGH (ref 0.61–1.24)
Creatinine, Ser: 2.4 mg/dL — ABNORMAL HIGH (ref 0.61–1.24)
GFR calc Af Amer: 50 mL/min — ABNORMAL LOW (ref >60)
GFR calc non Af Amer: 27 mL/min — ABNORMAL LOW (ref >60)
GFR calc non Af Amer: 43 mL/min — ABNORMAL LOW (ref >60)
GFR, EST AFRICAN AMERICAN: 32 mL/min — AB (ref >60)
GLUCOSE: 76 mg/dL (ref 65–99)
Glucose, Bld: 82 mg/dL (ref 65–99)
Potassium: 4.2 mmol/L (ref 3.5–5.1)
Potassium: 4.8 mmol/L (ref 3.5–5.1)
SODIUM: 108 mmol/L — AB (ref 135–145)
Sodium: 116 mmol/L — CL (ref 135–145)

## 2014-06-13 LAB — I-STAT CG4 LACTIC ACID, ED: Lactic Acid, Venous: 1.5 mmol/L (ref 0.5–2.0)

## 2014-06-13 LAB — HEPATIC FUNCTION PANEL
ALBUMIN: 3.8 g/dL (ref 3.5–5.0)
ALK PHOS: 70 U/L (ref 38–126)
ALT: 23 U/L (ref 17–63)
AST: 37 U/L (ref 15–41)
Bilirubin, Direct: <0.1 mg/dL — ABNORMAL LOW (ref 0.1–0.5)
Total Bilirubin: 0.6 mg/dL (ref 0.3–1.2)
Total Protein: 6.3 g/dL — ABNORMAL LOW (ref 6.5–8.1)

## 2014-06-13 LAB — CBC
HCT: 28 % — ABNORMAL LOW (ref 39.0–52.0)
Hemoglobin: 9.6 g/dL — ABNORMAL LOW (ref 13.0–17.0)
MCH: 26.4 pg (ref 26.0–34.0)
MCHC: 34.3 g/dL (ref 30.0–36.0)
MCV: 76.9 fL — ABNORMAL LOW (ref 78.0–100.0)
Platelets: 224 10*3/uL (ref 150–400)
RBC: 3.64 MIL/uL — ABNORMAL LOW (ref 4.22–5.81)
RDW: 15.1 % (ref 11.5–15.5)
WBC: 8.8 10*3/uL (ref 4.0–10.5)

## 2014-06-13 LAB — CBG MONITORING, ED
Glucose-Capillary: 127 mg/dL — ABNORMAL HIGH (ref 65–99)
Glucose-Capillary: 66 mg/dL (ref 65–99)

## 2014-06-13 LAB — RAPID URINE DRUG SCREEN, HOSP PERFORMED
AMPHETAMINES: NOT DETECTED
BARBITURATES: NOT DETECTED
BENZODIAZEPINES: POSITIVE — AB
Cocaine: NOT DETECTED
Opiates: NOT DETECTED
Tetrahydrocannabinol: NOT DETECTED

## 2014-06-13 LAB — TROPONIN I
Troponin I: <0.03 ng/mL (ref <0.031)
Troponin I: <0.03 ng/mL (ref <0.031)

## 2014-06-13 LAB — BRAIN NATRIURETIC PEPTIDE: B Natriuretic Peptide: 75.8 pg/mL (ref 0.0–100.0)

## 2014-06-13 LAB — ETHANOL: Alcohol, Ethyl (B): <5 mg/dL (ref <5)

## 2014-06-13 LAB — LIPASE, BLOOD: Lipase: 24 U/L (ref 22–51)

## 2014-06-13 MED ORDER — SODIUM CHLORIDE 0.9 % IV SOLN
INTRAVENOUS | Status: DC
  Administered 2014-06-13 – 2014-06-15 (×3): via INTRAVENOUS

## 2014-06-13 MED ORDER — SODIUM CHLORIDE 0.9 % IV BOLUS (SEPSIS)
500.0000 mL | Freq: Once | INTRAVENOUS | Status: AC
  Administered 2014-06-13: 500 mL via INTRAVENOUS

## 2014-06-13 MED ORDER — BUPROPION HCL ER (XL) 150 MG PO TB24
300.0000 mg | ORAL_TABLET | Freq: Every day | ORAL | Status: DC
  Administered 2014-06-13 – 2014-06-16 (×4): 300 mg via ORAL
  Filled 2014-06-13 (×2): qty 1
  Filled 2014-06-13: qty 2
  Filled 2014-06-13: qty 1

## 2014-06-13 MED ORDER — ACETAMINOPHEN 325 MG PO TABS
650.0000 mg | ORAL_TABLET | Freq: Four times a day (QID) | ORAL | Status: DC | PRN
  Administered 2014-06-14 – 2014-06-15 (×3): 650 mg via ORAL
  Filled 2014-06-13 (×3): qty 2

## 2014-06-13 MED ORDER — THIAMINE HCL 100 MG/ML IJ SOLN
100.0000 mg | Freq: Every day | INTRAMUSCULAR | Status: DC
  Filled 2014-06-13 (×2): qty 1

## 2014-06-13 MED ORDER — HYDROMORPHONE HCL 1 MG/ML IJ SOLN: 0.5000 mg | INTRAMUSCULAR | Status: DC | PRN

## 2014-06-13 MED ORDER — ONDANSETRON HCL 4 MG/2ML IJ SOLN: 4.0000 mg | Freq: Four times a day (QID) | INTRAMUSCULAR | Status: DC | PRN

## 2014-06-13 MED ORDER — ADULT MULTIVITAMIN W/MINERALS CH
1.0000 | ORAL_TABLET | Freq: Every day | ORAL | Status: DC
  Administered 2014-06-13 – 2014-06-16 (×4): 1 via ORAL
  Filled 2014-06-13 (×4): qty 1

## 2014-06-13 MED ORDER — DEXTROSE 50 % IV SOLN
1.0000 | Freq: Once | INTRAVENOUS | Status: AC
  Administered 2014-06-13: 50 mL via INTRAVENOUS
  Filled 2014-06-13: qty 50

## 2014-06-13 MED ORDER — FENTANYL CITRATE (PF) 100 MCG/2ML IJ SOLN
50.0000 ug | Freq: Once | INTRAMUSCULAR | Status: DC
Start: 2014-06-13 — End: 2014-06-15

## 2014-06-13 MED ORDER — NALOXONE HCL 0.4 MG/ML IJ SOLN
0.4000 mg | Freq: Once | INTRAMUSCULAR | Status: AC
  Administered 2014-06-13: 0.4 mg via INTRAVENOUS
  Filled 2014-06-13: qty 1

## 2014-06-13 MED ORDER — LORAZEPAM 2 MG/ML IJ SOLN: 0.0000 mg | Freq: Two times a day (BID) | INTRAMUSCULAR | Status: DC

## 2014-06-13 MED ORDER — ONDANSETRON HCL 4 MG PO TABS: 4.0000 mg | ORAL_TABLET | Freq: Four times a day (QID) | ORAL | Status: DC | PRN

## 2014-06-13 MED ORDER — QUETIAPINE FUMARATE ER 50 MG PO TB24
250.0000 mg | ORAL_TABLET | Freq: Every day | ORAL | Status: DC
  Administered 2014-06-13 – 2014-06-15 (×3): 250 mg via ORAL
  Filled 2014-06-13 (×5): qty 1

## 2014-06-13 MED ORDER — LORAZEPAM 1 MG PO TABS
1.0000 mg | ORAL_TABLET | Freq: Four times a day (QID) | ORAL | Status: AC | PRN
  Administered 2014-06-15 (×2): 1 mg via ORAL
  Filled 2014-06-13 (×3): qty 1

## 2014-06-13 MED ORDER — FOLIC ACID 1 MG PO TABS
1.0000 mg | ORAL_TABLET | Freq: Every day | ORAL | Status: DC
  Administered 2014-06-13 – 2014-06-16 (×4): 1 mg via ORAL
  Filled 2014-06-13 (×4): qty 1

## 2014-06-13 MED ORDER — ASPIRIN EC 325 MG PO TBEC
325.0000 mg | DELAYED_RELEASE_TABLET | Freq: Every morning | ORAL | Status: DC
  Administered 2014-06-13 – 2014-06-16 (×4): 325 mg via ORAL
  Filled 2014-06-13 (×4): qty 1

## 2014-06-13 MED ORDER — SODIUM CHLORIDE 0.9 % IV BOLUS (SEPSIS)
1000.0000 mL | Freq: Once | INTRAVENOUS | Status: AC
  Administered 2014-06-13: 1000 mL via INTRAVENOUS

## 2014-06-13 MED ORDER — OXYCODONE HCL 5 MG PO TABS
5.0000 mg | ORAL_TABLET | ORAL | Status: DC | PRN
  Administered 2014-06-13: 5 mg via ORAL
  Filled 2014-06-13: qty 1

## 2014-06-13 MED ORDER — LORAZEPAM 2 MG/ML IJ SOLN
0.0000 mg | Freq: Four times a day (QID) | INTRAMUSCULAR | Status: AC
  Administered 2014-06-13 – 2014-06-14 (×2): 1 mg via INTRAVENOUS
  Filled 2014-06-13 (×2): qty 1

## 2014-06-13 MED ORDER — ATORVASTATIN CALCIUM 40 MG PO TABS
40.0000 mg | ORAL_TABLET | Freq: Every day | ORAL | Status: DC
  Administered 2014-06-13 – 2014-06-16 (×4): 40 mg via ORAL
  Filled 2014-06-13 (×4): qty 1

## 2014-06-13 MED ORDER — LORAZEPAM 2 MG/ML IJ SOLN
1.0000 mg | Freq: Four times a day (QID) | INTRAMUSCULAR | Status: AC | PRN
  Administered 2014-06-13 – 2014-06-15 (×2): 1 mg via INTRAVENOUS
  Filled 2014-06-13 (×2): qty 1

## 2014-06-13 MED ORDER — HEPARIN SODIUM (PORCINE) 5000 UNIT/ML IJ SOLN
5000.0000 [IU] | Freq: Three times a day (TID) | INTRAMUSCULAR | Status: DC
  Administered 2014-06-14 – 2014-06-16 (×6): 5000 [IU] via SUBCUTANEOUS
  Filled 2014-06-13 (×11): qty 1

## 2014-06-13 MED ORDER — ACETAMINOPHEN 650 MG RE SUPP: 650.0000 mg | Freq: Four times a day (QID) | RECTAL | Status: DC | PRN

## 2014-06-13 MED ORDER — ALUM & MAG HYDROXIDE-SIMETH 200-200-20 MG/5ML PO SUSP
30.0000 mL | Freq: Four times a day (QID) | ORAL | Status: DC | PRN
  Administered 2014-06-13 – 2014-06-15 (×4): 30 mL via ORAL
  Filled 2014-06-13 (×4): qty 30

## 2014-06-13 MED ORDER — VITAMIN B-1 100 MG PO TABS
100.0000 mg | ORAL_TABLET | Freq: Every day | ORAL | Status: DC
  Administered 2014-06-13 – 2014-06-16 (×4): 100 mg via ORAL
  Filled 2014-06-13 (×4): qty 1

## 2014-06-13 NOTE — Progress Notes (Signed)
[place] TEAM 1 - Stepdown/ICU TEAM Progress Note  [name]:[id number] DOB: [birth year] DOA: 06/12/2014 PCP: Thayer Headings, MD  Admit HPI / Brief Narrative: [name] is a 65 y.o. WM PMHx depression, bipolar disorder, anxiety, alcoholism, alcohol related seizure, CAD S/P PTCA, MVP, Diastolic Dysfxn, HTN, Hyperlipidemia,   Presents to the ED with complaints of intermittent chest pain for the past 2 days . He denies SOB or nausea or vomiting. He reports that he has been heavily drinking since the loss of his brother in 12/2013. He last drank 1 days ago. He reports the he last drank 10 beers and 1 bottle of wine. He reports not eating or drinking very much for the past 2 days. When he arrived in the ED he was found to have hypotension with Systolic Blood Pressure of 60, and he was administered IVFs with improvement. His labs revealed hyponatremia with a sodium level of 108, and his initial troponin was negative.    HPI/Subjective: 5/21 A/O 4, NAD  Assessment/Plan: Hyponatremi/ Potomania  -Due to Dehydration and Alcohol Abuse Send Urine Electrolytes and Osm IVFs with NSS Monitor Na+ Trend  Hypotension - Due to Dehydration and Overmedication Monitor BPs IVFs  Chest pain at rest Cardiac Monitoring Cycle Troponins ASA No Nitroglycerins due to Hypotension  Alcohol abuse CIWA Protocol with IV Ativan  AKI (acute kidney injury) Mon itor BUN/Cr IVFs  Chronic diastolic heart failure Montor For Fluid Overload  CAD (coronary artery disease) Cardiac  Monitoring Metoprolol on hold due to Hypotension Cotninue ASA, and Atorvastatin Rx  HTN (hypertension) Holding Anti-Hypertensives due to Hypotension  Hyperlipidemia On Atorvastatin Rxx   Code Status: FULL Family Communication: no family present at time of exam Disposition Plan: SNF    Consultants:   Procedure/Significant Events:    Culture   Antibiotics:   DVT prophylaxis: Subcutaneous heparin   Devices    LINES / TUBES:      Continuous Infusions: . sodium chloride    . sodium chloride 125 mL/hr at 06/13/14 1357    Objective: VITAL SIGNS: Temp: 97.5 F (36.4 C) (05/21 1913) Temp Source: Oral (05/21 1913) BP: 97/58 mmHg (05/21 1913) Pulse Rate: 96 (05/21 1913) SPO2; FIO2:   Intake/Output Summary (Last 24 hours) at 06/13/14 2118 Last data filed at 06/13/14 2034  Gross per 24 hour  Intake 2642.5 ml  Output   6100 ml  Net -3457.5 ml     Exam: General: A/O 4, NAD, No acute respiratory distress Eyes: Negative headache, eye pain, double vision, scotomas, floaters, negative retinal hemorrhage ENT: Negative Runny nose, negative ear pain, negative tinnitus, negative gingival bleeding,  Neck:  Negative scars, masses, torticollis, lymphadenopathy, JVD Lungs: Clear to auscultation bilaterally without wheezes or crackles Cardiovascular: Tachycardic, Regular rhythm without murmur gallop or rub normal S1 and S2 Abdomen: Positive abdominal pain to palpation greatest RUQ/LLQ, negative dysphagia,, nondistended, soft, bowel sounds positive, no rebound, no ascites, no appreciable mass Extremities: No significant cyanosis, clubbing, or edema bilateral lower extremities Psychiatric:  Positive depression, positive anxiety, negative fatigue, negative mania Neurologic:  Cranial nerves II through XII intact, tongue/uvula midline, all extremities muscle strength 5/5,  sensation intact throughout,  negative dysarthria, negative expressive aphasia, negative receptive aphasia.      Data Reviewed: Basic Metabolic Panel:  Recent Labs Lab 06/12/14 2338 06/13/14 0830  NA 108* 116*  K 4.8 4.2  CL 75* 88*  CO2 15* 17*  GLUCOSE 76 82  BUN 26* 22*  CREATININE 2.40* 1.65*  CALCIUM 8.7*  8.2*   Liver Function Tests:  Recent Labs Lab 06/12/14 2346  AST 37  ALT 23  ALKPHOS 70  BILITOT 0.6  PROT 6.3*  ALBUMIN 3.8    Recent Labs Lab 06/12/14 2346  LIPASE 24   No results for input(s): AMMONIA in the last 168 hours. CBC:  Recent Labs Lab 06/12/14 2338 06/13/14 0830  WBC 9.5 8.8  HGB 10.2* 9.6*  HCT 29.6* 28.0*  MCV 78.5 76.9*  PLT 229 224   Cardiac Enzymes:  Recent Labs Lab 06/13/14 0428 06/13/14 0830 06/13/14 1453  TROPONINI <0.03 <0.03 <0.03   BNP (last 3 results)  Recent Labs  03/30/14 2346 05/22/14 1902 06/12/14 2338  BNP 64.6 61.5 75.8    ProBNP (last 3 results) No results for input(s): PROBNP in the last 8760 hours.  CBG:  Recent Labs Lab 06/13/14 0013 06/13/14 0113  GLUCAP 66 127*    No results found for this or any previous visit (from the past 240 hour(s)).   Studies:  Recent x-ray studies have been reviewed in detail by the Attending Physician  Scheduled Meds:  Scheduled Meds: . aspirin EC  325 mg Oral q morning - 10a  . atorvastatin  40 mg Oral Daily  . buPROPion  300 mg Oral Daily  . fentaNYL (SUBLIMAZE) injection  50 mcg Intravenous Once  . folic acid  1 mg Oral Daily  . heparin  5,000 Units Subcutaneous 3 times per day  . LORazepam  0-4 mg Intravenous Q6H   Followed by  . [START ON 06/15/2014] LORazepam  0-4 mg Intravenous Q12H  . multivitamin with minerals  1 tablet Oral Daily  . QUEtiapine  250 mg Oral QHS  . thiamine  100 mg Oral Daily   Or  . thiamine  100 mg Intravenous Daily    Time spent on care of this patient: 40 mins   [name], Roselind MessierURTIS J , MD  [facility] Office  [phone] Pager - [phone]  On-Call/Text Page:      [email or web address]      password [email or web address]  If 7PM-7AM, please contact night-coverage [email or web address] Password [email or web address] 06/13/2014, 9:18 PM   LOS: 0 days   Care during the described time interval was provided by me .  I have reviewed this patient's available data, including medical history, events of note, physical examination, and all test results as part of my evaluation. I have personally reviewed and interpreted all radiology studies.   [name] Littlesurtis Woods, MD [phone] Pager

## 2014-06-13 NOTE — ED Notes (Signed)
Pt's friend Nadine CountsBob sts we can call him if/when pt is ready for pickup.    Home - [phone] Cell - [phone]

## 2014-06-13 NOTE — Progress Notes (Signed)
Informed Dr. Joseph ArtWoods of pt BP 76/50. New orders for 1L bolus received. Will continue to monitor pt.

## 2014-06-13 NOTE — Plan of Care (Signed)
Problem: Consults Goal: Tobacco Cessation referral if indicated Outcome: Not Met (add Reason) Patient not ready to quit

## 2014-06-13 NOTE — Progress Notes (Signed)
1L bolus in. Pt post bolus BP 77/48. Text paged Dr. Joseph ArtWoods. Awaiting call back. Will continue to monitor pt.

## 2014-06-13 NOTE — H&P (Signed)
[facility] Admission History and Physical       [name]:[id number] DOB: 1951-01-05 DOA: 06/12/2014  Referring physician: EDP PCP: Thayer Headings, MD  Specialists:   Chief Complaint: Chest Pain  HPI: [name] is a 64 y.o. male with a history of CAD S/P PTCA,  MVP, Diastolic Dysfxn, HTN, Hyperlipidemia, Alcoholism, Bipolar Disorder who presents to the ED with complaints of intermittent chest pain for the past 2 days .   He denies SOB or nausea or vomiting.   He reports that he has been heavily drinking since the loss of his brother in 12/2013.   He last drank 1 days ago.   He reports the he last drank 10 beers and 1 bottle of wine. He reports not eating or drinking very much for the past 2 days.  When he arrived in the ED he was found to have hypotension with Systolic Blood Pressure of 60, and he was administered IVFs with improvement.   His labs revealed hyponatremia with a sodium level of 108, and his initial troponin was negative.   He    Review of Systems:  Constitutional: No Weight Loss, No Weight Gain, Night Sweats, Fevers, Chills, Dizziness, Light Headedness, Fatigue, or Generalized Weakness HEENT: No Headaches, Difficulty Swallowing,Tooth/Dental Problems,Sore Throat,  No Sneezing, Rhinitis, Ear Ache, Nasal Congestion, or Post Nasal Drip,  Cardio-vascular:  +Chest pain, Orthopnea, PND, Edema in Lower Extremities, Anasarca, Dizziness, Palpitations  Resp: No Dyspnea, No DOE, No Productive Cough, No Non-Productive Cough, No Hemoptysis, No Wheezing.    GI: No Heartburn, Indigestion, Abdominal Pain, Nausea, Vomiting, Diarrhea, Constipation, Hematemesis, Hematochezia, Melena, Change in Bowel Habits, + Loss of Appetite  GU: No Dysuria, No Change in Color of Urine, No Urgency or Urinary Frequency, No Flank pain.  Musculoskeletal: No Joint Pain or Swelling, No Decreased Range of Motion, No Back Pain.  Neurologic: No Syncope, No Seizures, Muscle Weakness, Paresthesia, Vision  Disturbance or Loss, No Diplopia, No Vertigo, No Difficulty Walking,  Skin: No Rash or Lesions. Psych: No Change in Mood or Affect, No Depression or Anxiety, No Memory loss, No Confusion, or Hallucinations   Past Medical History  Diagnosis Date  . MVP (mitral valve prolapse)   . Anxiety   . Hypertension   . Hyperlipidemia   . Palpitations   . Bipolar disorder   . Depression   . Esophagitis   . Gastritis   . Insomnia   . Hypercholesteremia   . Coronary artery disease     a.  s/p PTCA to PL branch of RCA in 2007;  b.  Last LHC 04/2008:  LM with irregularities, LAD 50%, mid LAD 50%, proximal D1 80-90%, proximal OM1 30-40%, proximal OM2 50%, PLB branch 30-40%, site of prior angioplasty in the RCA widely patent, EF 65%=>Med Rx;  [name]. Nuclear study a 12/2011: EF 71% and normal perfusion.    Marland Kitchen History of echocardiogram     Echo 08/16/09: Mild LVH, EF 55-60%, no normal wall motion, grade 1 diastolic dysfunction  . Anginal pain   . Pneumonia 2005  . GERD (gastroesophageal reflux disease)   . Arthritis     "hands; especially in cold weather" (12/11/2013)  . Alcohol related seizure     "when I go off it"     Past Surgical History  Procedure Laterality Date  . Coronary angioplasty  2007    RCA  . Cardiac catheterization  04/2008    EF 65%  . Knee arthroscopy, medial patello femoral ligament repair    .  Hip arthroplasty Bilateral     "both replaced twice"   . Total shoulder replacement Right   . Total hip revision  02/07/2012    Procedure: TOTAL HIP REVISION;  Surgeon: [name], MD;  Location: [facility] OR;  Service: Orthopedics;  Laterality: Left;  [place] Hip Revision Set  . Total knee arthroplasty Right   . Appendectomy  1958  . Tonsillectomy  1958  . Joint replacement        Prior to Admission medications   Medication Sig Start Date End Date Taking? Authorizing Provider  aspirin EC 325 MG tablet Take 1 tablet (325 mg total) by mouth every morning. For blood thinner for heart  health 04/02/14  Yes Alison Murray, MD  atorvastatin (LIPITOR) 40 MG tablet Take 1 tablet (40 mg total) by mouth daily. For cholesterol 10/24/12  Yes Sanjuana Kava, NP  buPROPion (WELLBUTRIN XL) 300 MG 24 hr tablet Take 1 tablet (300 mg total) by mouth daily. For depression 10/24/12  Yes Sanjuana Kava, NP  LORazepam (ATIVAN) 1 MG tablet Take 1 tablet (1 mg total) by mouth every 8 (eight) hours as needed (CIWA-AR > 8-OR-withdrawal symptoms:anxiety, agitation, insomnia, diaphoresis, nausea, vomiting, tremors, tachycardia, or hypertension.). Patient taking differently: Take 1 mg by mouth 2 (two) times daily.  04/02/14  Yes Alison Murray, MD  metoprolol tartrate (LOPRESSOR) 25 MG tablet Take 25 mg by mouth at bedtime.   Yes Historical Provider, MD  Multiple Vitamin (MULTIVITAMIN WITH MINERALS) TABS tablet Take 1 tablet by mouth daily. 04/02/14  Yes Alison Murray, MD  nitroGLYCERIN (NITROSTAT) 0.4 MG SL tablet Place 1 tablet (0.4 mg total) under the tongue every 5 (five) minutes as needed for chest pain (CP or SOB). 10/24/12  Yes Sanjuana Kava, NP  QUEtiapine (SEROQUEL XR) 50 MG TB24 24 hr tablet Take 5 tablets (250 mg total) by mouth at bedtime. Patient taking differently: Take 50-200 mg by mouth at bedtime. Take 1 Tablet (50 mg) in the am and Take 4 Tablets (200 mg) in the pm 08/15/13  Yes Charm Rings, NP  chlordiazePOXIDE (LIBRIUM) 25 MG capsule  PO TID x 1D, then 25-50mg  PO BID X 1D, then 25-50mg  PO QD X 1D Patient not taking: Reported on 06/13/2014 05/23/14   Azalia Bilis, MD  hydrOXYzine (ATARAX/VISTARIL) 25 MG tablet Take 1 tablet (25 mg total) by mouth every 6 (six) hours as needed for anxiety. Patient not taking: Reported on 06/13/2014 10/24/12   Sanjuana Kava, NP  pantoprazole (PROTONIX) 40 MG tablet Take 1 tablet (40 mg total) by mouth daily. Patient not taking: Reported on 06/13/2014 04/02/14   Alison Murray, MD  thiamine 100 MG tablet Take 1 tablet (100 mg total) by mouth daily. Patient not  taking: Reported on 06/13/2014 04/02/14   Alison Murray, MD     Allergies  Allergen Reactions  . Ambien [Zolpidem Tartrate] Other (See Comments)    Sleep Walking  . Codeine Itching    Social History:  reports that he has been smoking Cigarettes.  He has a 15 pack-year smoking history. He has never used smokeless tobacco. He reports that he drinks alcohol. He reports that he uses illicit drugs (Benzodiazepines) about 7 times per week.    Family History  Problem Relation Age of Onset  . Heart disease Neg Hx   . Prostate cancer Father        Physical Exam:  GEN:  Pleasant Thin  64 y.o. Caucasian male examined  and in no acute distress; cooperative with exam Filed Vitals:   06/13/14 0020 06/13/14 0030 06/13/14 0056 06/13/14 0130  BP: 81/55 86/52 86/58  125/79  Pulse: 86 83 112 82  Temp:      TempSrc:      Resp: 20  20   SpO2: 93% 97% 100% 100%   Blood pressure 125/79, pulse 82, temperature 97.4 F (36.3 [name]), temperature source Oral, resp. rate 20, SpO2 100 %. PSYCH: He is alert and oriented x4; does not appear anxious does not appear depressed; affect is normal HEENT: Normocephalic and Atraumatic, Mucous membranes pink; PERRLA; EOM intact; Fundi:  Benign;  No scleral icterus, Nares: Patent, Oropharynx: Clear, Fair Dentition,    Neck:  FROM, No Cervical Lymphadenopathy nor Thyromegaly or Carotid Bruit; No JVD; Breasts:: Not examined CHEST WALL: No tenderness CHEST: Normal respiration, clear to auscultation bilaterally HEART: Regular rate and rhythm; no murmurs rubs or gallops BACK: No kyphosis or scoliosis; No CVA tenderness ABDOMEN: Positive Bowel Sounds, Soft Non-Tender, No Rebound or Guarding; No Masses, No Organomegaly. Rectal Exam: Not done EXTREMITIES: No Cyanosis, Clubbing, or Edema; No Ulcerations. Genitalia: not examined PULSES: 2+ and symmetric SKIN: Normal hydration no rash or ulceration CNS:  Alert and Oriented x 4, No Focal Deficits Vascular: pulses palpable  throughout    Labs on Admission:  Basic Metabolic Panel:  Recent Labs Lab 06/12/14 2338  NA 108*  K 4.8  CL 75*  CO2 15*  GLUCOSE 76  BUN 26*  CREATININE 2.40*  CALCIUM 8.7*   Liver Function Tests:  Recent Labs Lab 06/12/14 2346  AST 37  ALT 23  ALKPHOS 70  BILITOT 0.6  PROT 6.3*  ALBUMIN 3.8    Recent Labs Lab 06/12/14 2346  LIPASE 24   No results for input(s): AMMONIA in the last 168 hours. CBC:  Recent Labs Lab 06/12/14 2338  WBC 9.5  HGB 10.2*  HCT 29.6*  MCV 78.5  PLT 229   Cardiac Enzymes: No results for input(s): CKTOTAL, CKMB, CKMBINDEX, TROPONINI in the last 168 hours.  BNP (last 3 results)  Recent Labs  03/30/14 2346 05/22/14 1902 06/12/14 2338  BNP 64.6 61.5 75.8    ProBNP (last 3 results) No results for input(s): PROBNP in the last 8760 hours.  CBG:  Recent Labs Lab 06/13/14 0013 06/13/14 0113  GLUCAP 66 127*    Radiological Exams on Admission: Dg Chest Port 1 View  06/13/2014   CLINICAL DATA:  Chest pain.  Hypotension.  Symptoms for 1 day.  EXAM: PORTABLE CHEST - 1 VIEW  COMPARISON:  05/22/2014, 03/30/2014  FINDINGS: Lower lung volumes from prior exam. Cardiomegaly is unchanged allowing for differences in technique. There is no pulmonary edema, confluent airspace disease, large pleural effusion or pneumothorax. Right proximal humeral prosthesis in place.  IMPRESSION: Stable cardiomegaly without acute or localizing pulmonary process.   Electronically Signed   By: Rubye Oaks M.D.   On: 06/13/2014 00:49     EKG: Independently reviewed. Normal Sinus Rhythm rate 96 with a 1st Degree AV Block   Assessment/Plan:   64 y.o. male with  Principal Problem:   1.    Hyponatremia- Due to Dehydration and Alcohol Abuse   Send Urine Electrolytes and Osm   IVFs with NSS   Monitor Na+ Trend   Active Problems:   2.   Hypotension- Due to Dehydration and Overmedication   Monitor BPs   IVFs       3.   Chest pain at  rest  Cardiac Monitoring   Cycle Troponins   ASA   No Nitroglycerins due to Hypotension       4.   Alcohol abuse     CIWA Protocol with IV Ativan     5.   AKI (acute kidney injury)   Mon itor BUN/Cr   IVFs     6.   Chronic diastolic heart failure   Montor For Fluid Overload     7.   CAD (coronary artery disease)   Cardiac Monitoring   Metoprolol on hold due to Hypotension   Cotninue ASA, and Atorvastatin Rx     8.   HTN (hypertension)   Holding Anti-Hypertensives due to Hypotension     9.   Hyperlipidemia   On Atorvastatin Rxx    10.  DVT Prophylaxis   SQ Heparin         Code Status:     FULL CODE       Family Communication:   No Family Present    Disposition Plan:    Inpatient Status        Time spent:  7570 Minutes      [name],[name] [facility] Pager [phone]   If 7AM -7PM Please Contact the Day Rounding Team MD for [facility]  If 7PM-7AM, Please Contact Night-Floor Coverage  [email or web address] Password [email or web address] 06/13/2014, 2:39 AM     ADDENDUM:   Patient was seen and examined on 06/13/2014

## 2014-06-13 NOTE — Progress Notes (Signed)
CRITICAL VALUE ALERT  Critical value received:  Na 116  Date of notification:  06/13/14  Time of notification:  0925  Critical value read back:Yes.    Nurse who received alert:  Buckner Maltaracie Durenda Pechacek  MD notified (1st page):  Dr. Joseph ArtWoods  Time of first page:  0927  MD notified (2nd page):  Time of second page:  Responding MD:  Dr. Joseph ArtWoods  Time MD responded:  [phone]

## 2014-06-14 DIAGNOSIS — I2510 Atherosclerotic heart disease of native coronary artery without angina pectoris: Secondary | ICD-10-CM | POA: Diagnosis present

## 2014-06-14 DIAGNOSIS — I10 Essential (primary) hypertension: Secondary | ICD-10-CM | POA: Diagnosis present

## 2014-06-14 DIAGNOSIS — I517 Cardiomegaly: Secondary | ICD-10-CM | POA: Diagnosis present

## 2014-06-14 LAB — CBC WITH DIFFERENTIAL/PLATELET
BASOS PCT: 0 % (ref 0–1)
Basophils Absolute: 0 10*3/uL (ref 0.0–0.1)
EOS ABS: 0.3 10*3/uL (ref 0.0–0.7)
Eosinophils Relative: 5 % (ref 0–5)
HCT: 26.9 % — ABNORMAL LOW (ref 39.0–52.0)
HEMOGLOBIN: 9.1 g/dL — AB (ref 13.0–17.0)
LYMPHS PCT: 31 % (ref 12–46)
Lymphs Abs: 1.7 10*3/uL (ref 0.7–4.0)
MCH: 26.5 pg (ref 26.0–34.0)
MCHC: 33.8 g/dL (ref 30.0–36.0)
MCV: 78.2 fL (ref 78.0–100.0)
MONO ABS: 0.4 10*3/uL (ref 0.1–1.0)
MONOS PCT: 7 % (ref 3–12)
NEUTROS PCT: 57 % (ref 43–77)
Neutro Abs: 3.1 10*3/uL (ref 1.7–7.7)
PLATELETS: 240 10*3/uL (ref 150–400)
RBC: 3.44 MIL/uL — ABNORMAL LOW (ref 4.22–5.81)
RDW: 15.6 % — ABNORMAL HIGH (ref 11.5–15.5)
WBC: 5.5 10*3/uL (ref 4.0–10.5)

## 2014-06-14 LAB — COMPREHENSIVE METABOLIC PANEL
ALT: 19 U/L (ref 17–63)
AST: 27 U/L (ref 15–41)
Albumin: 2.8 g/dL — ABNORMAL LOW (ref 3.5–5.0)
Alkaline Phosphatase: 69 U/L (ref 38–126)
Anion gap: 7 (ref 5–15)
BUN: 11 mg/dL (ref 6–20)
CO2: 19 mmol/L — ABNORMAL LOW (ref 22–32)
Calcium: 8.3 mg/dL — ABNORMAL LOW (ref 8.9–10.3)
Chloride: 101 mmol/L (ref 101–111)
Creatinine, Ser: 1.11 mg/dL (ref 0.61–1.24)
GLUCOSE: 98 mg/dL (ref 65–99)
Potassium: 3.9 mmol/L (ref 3.5–5.1)
SODIUM: 127 mmol/L — AB (ref 135–145)
Total Bilirubin: 0.5 mg/dL (ref 0.3–1.2)
Total Protein: 5.3 g/dL — ABNORMAL LOW (ref 6.5–8.1)

## 2014-06-14 LAB — AMMONIA: Ammonia: 22 umol/L (ref 9–35)

## 2014-06-14 LAB — MAGNESIUM: Magnesium: 2 mg/dL (ref 1.7–2.4)

## 2014-06-14 MED ORDER — POLYETHYLENE GLYCOL 3350 17 G PO PACK
17.0000 g | PACK | Freq: Two times a day (BID) | ORAL | Status: DC | PRN
  Filled 2014-06-14: qty 1

## 2014-06-14 NOTE — Progress Notes (Signed)
[place] TEAM 1 - Stepdown/ICU TEAM Progress Note  [name] BanDavid M Demaria WUJ:[id number] DOB: [birth year] DOA: 06/12/2014 PCP: Thayer HeadingsMACKENZIE,BRIAN, MD  Admit HPI / Brief Narrative: [name] is a 64 y.o. WM PMHx depression, bipolar disorder, anxiety, alcoholism, alcohol related seizure, CAD S/P PTCA, MVP, Diastolic Dysfxn, HTN, Hyperlipidemia,   Presents to the ED with complaints of intermittent chest pain for the past 2 days . He denies SOB or nausea or vomiting. He reports that he has been heavily drinking since the loss of his brother in 12/2013. He last drank 1 days ago. He reports the he last drank 10 beers and 1 bottle of wine. He reports not eating or drinking very much for the past 2 days. When he arrived in the ED he was found to have hypotension with Systolic Blood Pressure of 60, and he was administered IVFs with improvement. His labs revealed hyponatremia with a sodium level of 108, and his initial troponin was negative.    HPI/Subjective: 5/22 A/O 4, NAD  Assessment/Plan: Hyponatremi/ Potomania  -Due to Dehydration and Alcohol Abuse -Improving continue normal saline at 16800ml/hr - Hypotension - Due to Dehydration and Overmedication -Improving with hydration, however still on the low side. See hyponatremia  Chest pain at rest -Troponins 3 negative -No Nitroglycerins due to Hypotension -Continue aspirin 325 mg daily  CAD (coronary artery disease) -Continue Cardiac Monitoring -Metoprolol on hold due to Hypotension -Cotninue ASA, and Atorvastatin Rx -Lipid panel pending  HTN (hypertension) -Holding Anti-Hypertensives due to Hypotension  LVH  -patient still relatively hypotensive typical BP medications on hold   Alcohol abuse -ContinueCIWA Protocol with IV Ativan -Patient interested in attending [facility] when discharged will place consult to CSW; CSW to arrange for bed placement.   AKI (acute  kidney injury) -Improving continue to hydrate  Hyperlipidemia -Continue Lipitor 40 mg daily -Lipid panel pending     Code Status: FULL Family Communication: no family present at time of exam Disposition Plan:  [facility]?    Consultants:   Procedure/Significant Events: 03/31/14 echocardiogram;Left ventricle: moderate focal basal and mild concentric hypertrophy.  -LVEF=60% to 65%. -(grade 1 diastolic dysfunction).    Culture   Antibiotics:   DVT prophylaxis: Subcutaneous heparin   Devices    LINES / TUBES:      Continuous Infusions: . sodium chloride    . sodium chloride 100 mL/hr at 06/14/14 1840    Objective: VITAL SIGNS: Temp: 98 F (36.7 C) (05/22 1905) Temp Source: Oral (05/22 1905) BP: 103/62 mmHg (05/22 1905) Pulse Rate: 94 (05/22 1905) SPO2; FIO2:   Intake/Output Summary (Last 24 hours) at 06/14/14 2104 Last data filed at 06/14/14 2029  Gross per 24 hour  Intake   2990 ml  Output   3700 ml  Net   -710 ml     Exam: General: A/O 4, NAD, No acute respiratory distress Eyes: Negative headache, eye pain, double vision, scotomas, floaters, negative retinal hemorrhage ENT: Negative Runny nose, negative ear pain, negative tinnitus, negative gingival bleeding,  Neck:  Negative scars, masses, torticollis, lymphadenopathy, JVD Lungs: Clear to auscultation bilaterally without wheezes or crackles Cardiovascular: Tachycardic, Regular rhythm without murmur gallop or rub normal S1 and S2 Abdomen: Positive abdominal pain to palpation greatest RUQ/LLQ, negative dysphagia,, nondistended, soft, bowel sounds positive, no rebound, no ascites, no appreciable mass Extremities: No significant cyanosis, clubbing, or edema bilateral lower extremities Psychiatric:  Positive depression, positive anxiety, negative fatigue, negative mania Neurologic:  Cranial nerves II through XII intact, tongue/uvula midline,  all extremities  muscle strength 5/5, sensation intact throughout,  negative dysarthria, negative expressive aphasia, negative receptive aphasia.      Data Reviewed: Basic Metabolic Panel:  Recent Labs Lab 06/12/14 2338 06/13/14 0830 06/14/14 0352  NA 108* 116* 127*  K 4.8 4.2 3.9  CL 75* 88* 101  CO2 15* 17* 19*  GLUCOSE 76 82 98  BUN 26* 22* 11  CREATININE 2.40* 1.65* 1.11  CALCIUM 8.7* 8.2* 8.3*  MG  --   --  2.0   Liver Function Tests:  Recent Labs Lab 06/12/14 2346 06/14/14 0352  AST 37 27  ALT 23 19  ALKPHOS 70 69  BILITOT 0.6 0.5  PROT 6.3* 5.3*  ALBUMIN 3.8 2.8*    Recent Labs Lab 06/12/14 2346  LIPASE 24    Recent Labs Lab 06/14/14 0352  AMMONIA 22   CBC:  Recent Labs Lab 06/12/14 2338 06/13/14 0830 06/14/14 0352  WBC 9.5 8.8 5.5  NEUTROABS  --   --  3.1  HGB 10.2* 9.6* 9.1*  HCT 29.6* 28.0* 26.9*  MCV 78.5 76.9* 78.2  PLT 229 224 240   Cardiac Enzymes:  Recent Labs Lab 06/13/14 0428 06/13/14 0830 06/13/14 1453  TROPONINI <0.03 <0.03 <0.03   BNP (last 3 results)  Recent Labs  03/30/14 2346 05/22/14 1902 06/12/14 2338  BNP 64.6 61.5 75.8    ProBNP (last 3 results) No results for input(s): PROBNP in the last 8760 hours.  CBG:  Recent Labs Lab 06/13/14 0013 06/13/14 0113  GLUCAP 66 127*    No results found for this or any previous visit (from the past 240 hour(s)).   Studies:  Recent x-ray studies have been reviewed in detail by the Attending Physician  Scheduled Meds:  Scheduled Meds: . aspirin EC  325 mg Oral q morning - 10a  . atorvastatin  40 mg Oral Daily  . buPROPion  300 mg Oral Daily  . fentaNYL (SUBLIMAZE) injection  50 mcg Intravenous Once  . folic acid  1 mg Oral Daily  . heparin  5,000 Units Subcutaneous 3 times per day  . LORazepam  0-4 mg Intravenous Q6H   Followed by  . [START ON 06/15/2014] LORazepam  0-4 mg Intravenous Q12H  . multivitamin with minerals  1 tablet Oral Daily  . QUEtiapine  250 mg Oral  QHS  . thiamine  100 mg Oral Daily    Time spent on care of this patient: 40 mins   Arshiya Jakes, Roselind Messier , MD  [facility] Office  [phone] Pager [phone]  On-Call/Text Page:      [email or web address]      password [email or web address]  If 7PM-7AM, please contact night-coverage [email or web address] Password [email or web address] 06/14/2014, 9:04 PM   LOS: 1 day   Care during the described time interval was provided by me .  I have reviewed this patient's available data, including medical history, events of note, physical examination, and all test results as part of my evaluation. I have personally reviewed and interpreted all radiology studies.   Carolyne Littles, MD [phone] Pager

## 2014-06-15 LAB — CBC WITH DIFFERENTIAL/PLATELET
BASOS PCT: 0 % (ref 0–1)
Basophils Absolute: 0 10*3/uL (ref 0.0–0.1)
Eosinophils Absolute: 0.2 10*3/uL (ref 0.0–0.7)
Eosinophils Relative: 5 % (ref 0–5)
HCT: 24.9 % — ABNORMAL LOW (ref 39.0–52.0)
Hemoglobin: 8.6 g/dL — ABNORMAL LOW (ref 13.0–17.0)
LYMPHS ABS: 2 10*3/uL (ref 0.7–4.0)
Lymphocytes Relative: 39 % (ref 12–46)
MCH: 27.4 pg (ref 26.0–34.0)
MCHC: 34.5 g/dL (ref 30.0–36.0)
MCV: 79.3 fL (ref 78.0–100.0)
MONO ABS: 0.5 10*3/uL (ref 0.1–1.0)
Monocytes Relative: 10 % (ref 3–12)
Neutro Abs: 2.4 10*3/uL (ref 1.7–7.7)
Neutrophils Relative %: 46 % (ref 43–77)
Platelets: 203 10*3/uL (ref 150–400)
RBC: 3.14 MIL/uL — AB (ref 4.22–5.81)
RDW: 15.9 % — ABNORMAL HIGH (ref 11.5–15.5)
WBC: 5.2 10*3/uL (ref 4.0–10.5)

## 2014-06-15 LAB — LIPID PANEL
CHOLESTEROL: 87 mg/dL (ref 0–200)
HDL: 36 mg/dL — AB (ref >40)
LDL Cholesterol: 39 mg/dL (ref 0–99)
Total CHOL/HDL Ratio: 2.4 RATIO
Triglycerides: 58 mg/dL (ref <150)
VLDL: 12 mg/dL (ref 0–40)

## 2014-06-15 LAB — COMPREHENSIVE METABOLIC PANEL
ALBUMIN: 2.7 g/dL — AB (ref 3.5–5.0)
ALK PHOS: 67 U/L (ref 38–126)
ALT: 18 U/L (ref 17–63)
ANION GAP: 5 (ref 5–15)
AST: 24 U/L (ref 15–41)
BUN: <5 mg/dL — ABNORMAL LOW (ref 6–20)
CALCIUM: 8.3 mg/dL — AB (ref 8.9–10.3)
CO2: 23 mmol/L (ref 22–32)
CREATININE: 0.89 mg/dL (ref 0.61–1.24)
Chloride: 101 mmol/L (ref 101–111)
Glucose, Bld: 123 mg/dL — ABNORMAL HIGH (ref 65–99)
POTASSIUM: 3.8 mmol/L (ref 3.5–5.1)
Sodium: 129 mmol/L — ABNORMAL LOW (ref 135–145)
Total Bilirubin: 0.6 mg/dL (ref 0.3–1.2)
Total Protein: 5.2 g/dL — ABNORMAL LOW (ref 6.5–8.1)

## 2014-06-15 LAB — AMMONIA: AMMONIA: 18 umol/L (ref 9–35)

## 2014-06-15 LAB — MAGNESIUM: MAGNESIUM: 1.8 mg/dL (ref 1.7–2.4)

## 2014-06-15 MED ORDER — METOPROLOL TARTRATE 25 MG PO TABS
25.0000 mg | ORAL_TABLET | Freq: Every day | ORAL | Status: DC
  Administered 2014-06-15: 25 mg via ORAL
  Filled 2014-06-15 (×2): qty 1

## 2014-06-15 MED ORDER — ACETAMINOPHEN 500 MG PO TABS
500.0000 mg | ORAL_TABLET | Freq: Four times a day (QID) | ORAL | Status: DC | PRN
  Administered 2014-06-16 (×2): 500 mg via ORAL
  Filled 2014-06-15 (×2): qty 1

## 2014-06-15 MED ORDER — ACETAMINOPHEN 650 MG RE SUPP: 325.0000 mg | Freq: Four times a day (QID) | RECTAL | Status: DC | PRN

## 2014-06-15 MED ORDER — OXYCODONE HCL 5 MG PO TABS: 5.0000 mg | ORAL_TABLET | ORAL | Status: DC | PRN

## 2014-06-15 MED ORDER — SODIUM CHLORIDE 0.9 % IV SOLN: 1000.0000 mL | INTRAVENOUS | Status: DC

## 2014-06-15 NOTE — Progress Notes (Signed)
[place] TEAM 1 - Stepdown/ICU TEAM Progress Note  [name] EAV:[id number] DOB: 16-May-1950 DOA: 06/12/2014 PCP: Thayer Headings, MD  Admit HPI / Brief Narrative: 64 y.o. M Hx depression, bipolar disorder, anxiety, alcoholism, alcohol related seizure, CAD S/P PTCA, MVP, Diastolic Dysfxn, HTN, and Hyperlipidemia who presentsed to the ED with complaints of intermittent chest pain for 2 days.  He denied SOB.He reported he had been heavily drinking since the loss of his brother in 12/2013.  In the ED he was found to have a Systolic Blood Pressure of 60, and he was administered IVFs with improvement.His labs revealed hyponatremia with a sodium level of 108.   HPI/Subjective: The patient is resting comfortably in bed.  He states that he feels weak in general.  He reports that he has not been up and around independently.  He denies headache nausea vomiting fevers chills shortness of breath or abdominal pain.  Assessment/Plan:  Hyponatremia / Potomania  -Due to Dehydration and Alcohol Abuse -Improving w/ abstinence and normal saline IVF - attempt to liberate from IV fluid over next 12-24 hours   Hypotension -Resolved with hydration  Chest pain at rest -Resolved -Troponins 3 negative -last Myoview in 2013 was without ischemia -continue aspirin 325 mg daily -at high risk for EtOH related GI illness (esophagitis, gastritis) - empiric PPI and follow -further w/u as outpt once recovered from EtOH - consider EGD + cardiac stress testing   Hx of CAD  -Resume metoprolol  -Cotninue ASA and Atorvastatin  -LDL at goal  Hx of HTN -blood pressure currently well controlled  Alcohol abuse -ContinueCIWA Protocol with IV Ativan - now in final stages  -Patient interested in attending [facility] when discharged  but beds not readily available per CSW and therefore patient will likely need to go home first  Acute kidney  injury -Has resolved with volume resuscitation  Hyperlipidemia -Continue Lipitor 40 mg daily - LDL 39  Anemia panel anemia panel -Likely due to bone marrow suppression related to alcoholism - check anemia panel  Code Status: FULL Family Communication: no family present at time of exam Disposition Plan:  transfer to medical bed  - complete CIWA protocol  - PT OT to ambulate  - follow-up sodium in a.m.  - possible discharge home 5/24   Consultants: none  Procedure/Significant Events: 03/31/14 echocardiogram;Left ventricle: moderate focal basal and mild concentric hypertrophy.  -LVEF=60% to 65%. -(grade 1 diastolic dysfunction).  Antibiotics: None  DVT prophylaxis: Subcutaneous heparin  Objective: Blood pressure 103/60, pulse 73, temperature 97.7 F (36.5 C), temperature source Oral, resp. rate 13, height  (1.778 m), weight 85.3 kg (188 lb 0.8 oz), SpO2 96 %.  Intake/Output Summary (Last 24 hours) at 06/15/14 1204 Last data filed at 06/15/14 0900  Gross per 24 hour  Intake   2870 ml  Output   2250 ml  Net    620 ml    Exam: General: No acute respiratory distress Neck:  Negative JVD Lungs: Clear to auscultation bilaterally without wheezes or crackles Cardiovascular: regular rate and rhythm without murmur gallop or rub  Abdomen:  nontender nondistended soft bowel sounds present no organomegaly no rebound no ascites Extremities: No significant cyanosis, clubbing, or edema bilateral lower extremities Neurologic:  Cranial nerves II through XII intact, tongue/uvula midline   Data Reviewed: Basic Metabolic Panel:  Recent Labs Lab 06/12/14 2338 06/13/14 0830 06/14/14 0352 06/15/14 0230  NA 108* 116* 127* 129*  K 4.8 4.2 3.9 3.8  CL 75* 88* 101  101  CO2 15* 17* 19* 23  GLUCOSE 76 82 98 123*  BUN 26* 22* 11 <5*  CREATININE 2.40* 1.65* 1.11 0.89  CALCIUM 8.7* 8.2* 8.3* 8.3*  MG  --   --  2.0 1.8   Liver Function Tests:  Recent  Labs Lab 06/12/14 2346 06/14/14 0352 06/15/14 0230  AST 37 27 24  ALT 23 19 18   ALKPHOS 70 69 67  BILITOT 0.6 0.5 0.6  PROT 6.3* 5.3* 5.2*  ALBUMIN 3.8 2.8* 2.7*    Recent Labs Lab 06/12/14 2346  LIPASE 24    Recent Labs Lab 06/14/14 0352 06/15/14 0230  AMMONIA 22 18   CBC:  Recent Labs Lab 06/12/14 2338 06/13/14 0830 06/14/14 0352 06/15/14 0230  WBC 9.5 8.8 5.5 5.2  NEUTROABS  --   --  3.1 2.4  HGB 10.2* 9.6* 9.1* 8.6*  HCT 29.6* 28.0* 26.9* 24.9*  MCV 78.5 76.9* 78.2 79.3  PLT 229 224 240 203   Cardiac Enzymes:  Recent Labs Lab 06/13/14 0428 06/13/14 0830 06/13/14 1453  TROPONINI <0.03 <0.03 <0.03   CBG:  Recent Labs Lab 06/13/14 0013 06/13/14 0113  GLUCAP 66 127*    Studies:  Recent x-ray studies have been reviewed in detail by the Attending Physician  Scheduled Meds:  Scheduled Meds: . aspirin EC  325 mg Oral q morning - 10a  . atorvastatin  40 mg Oral Daily  . buPROPion  300 mg Oral Daily  . fentaNYL (SUBLIMAZE) injection  50 mcg Intravenous Once  . folic acid  1 mg Oral Daily  . heparin  5,000 Units Subcutaneous 3 times per day  . LORazepam  0-4 mg Intravenous Q12H  . multivitamin with minerals  1 tablet Oral Daily  . QUEtiapine  250 mg Oral QHS  . thiamine  100 mg Oral Daily    Time spent on care of this patient: 35 mins  Lonia BloodJeffrey T. Shineka Auble, MD [facility] For Consults/Admissions - Flow Manager - [phone] Office  [phone]  Contact MD directly via text page:      [email or web address]      password [email or web address]  06/15/2014, 12:04 PM   LOS: 2 days

## 2014-06-15 NOTE — Progress Notes (Signed)
Medicare Important Message given? YES (If response is "NO", the following Medicare IM given date fields will be blank) Date Medicare IM given: 06/15/14  Medicare IM given by: Kentarius Partington 

## 2014-06-15 NOTE — Progress Notes (Signed)
Report given to Okey Regalarol, RN, all questions answered.  Patient will notify family of transfer.

## 2014-06-15 NOTE — Clinical Social Work Note (Signed)
Clinical Social Work Assessment  Patient Details  Name: [name] MRN: [id number] Date of Birth: 01/29/1950  Date of referral:  06/14/14               Reason for consult:  Substance Use/ETOH Abuse              Housing/Transportation Living arrangements for the past 2 months:  Apartment Source of Information:   Patient  Patient Interpreter Needed:  None Criminal Activity/Legal Involvement Pertinent to Current Situation/Hospitalization:  No - Comment as needed Significant Relationships:  Friend Lives with:  Self Do you feel safe going back to the place where you live?  Yes Need for family participation in patient care:  No (Coment)  Care giving concerns:  N/A   Facilities manager / plan:  CSW met the pt at the bedside. CSW introduce self and purpose of visit. Per the pt requested CSW sent a drug referral to [facility]. CSW informed the pt that Chinita Pester has a awaiting list. CSW provided the [facility] intake personnel with the pt's contact information.   Employment status:  Disabled (Comment on whether or not currently receiving Disability) Information / Referral to community resources:   [facility] referral    Patient/Family's Response to care:  Pt reported the care in which is he receiving is good.   Patient/Family's Understanding of and Emotional Response to Diagnosis, Current Treatment, and Prognosis: Pt acknowledged his challenges with drugs. Pt expressed the desire for help.   Emotional Assessment Appearance:  Appears stated age Attitude/Demeanor/Rapport:   (Calm ) Affect (typically observed):  Pleasant, Accepting Orientation:  Oriented to Self, Oriented to Place, Oriented to  Time, Oriented to Situation Alcohol / Substance use:    Substance  Psych involvement (Current and /or in the community):  No (Comment)  Discharge Needs  Concerns to be addressed:  Substance Abuse Concerns Current discharge risk:  Substance Abuse Barriers to Discharge:  No Barriers  Identified   Melvin, MSW, [place]

## 2014-06-16 DIAGNOSIS — I25119 Atherosclerotic heart disease of native coronary artery with unspecified angina pectoris: Secondary | ICD-10-CM

## 2014-06-16 DIAGNOSIS — I9589 Other hypotension: Secondary | ICD-10-CM

## 2014-06-16 LAB — BASIC METABOLIC PANEL
Anion gap: 6 (ref 5–15)
CALCIUM: 8.6 mg/dL — AB (ref 8.9–10.3)
CO2: 21 mmol/L — ABNORMAL LOW (ref 22–32)
CREATININE: 0.78 mg/dL (ref 0.61–1.24)
Chloride: 103 mmol/L (ref 101–111)
GFR calc Af Amer: >60 mL/min (ref >60)
Glucose, Bld: 92 mg/dL (ref 65–99)
Potassium: 4.1 mmol/L (ref 3.5–5.1)
SODIUM: 130 mmol/L — AB (ref 135–145)

## 2014-06-16 LAB — RETICULOCYTES
RBC.: 3 MIL/uL — ABNORMAL LOW (ref 4.22–5.81)
RETIC CT PCT: 1.1 % (ref 0.4–3.1)
Retic Count, Absolute: 33 10*3/uL (ref 19.0–186.0)

## 2014-06-16 LAB — IRON AND TIBC
Iron: 57 ug/dL (ref 45–182)
Saturation Ratios: 17 % — ABNORMAL LOW (ref 17.9–39.5)
TIBC: 340 ug/dL (ref 250–450)
UIBC: 283 ug/dL

## 2014-06-16 LAB — FOLATE: Folate: 18.3 ng/mL (ref >5.9)

## 2014-06-16 LAB — FERRITIN: Ferritin: 58 ng/mL (ref 24–336)

## 2014-06-16 LAB — VITAMIN B12: Vitamin B-12: 422 pg/mL (ref 180–914)

## 2014-06-16 MED ORDER — LORAZEPAM 1 MG PO TABS
1.0000 mg | ORAL_TABLET | Freq: Four times a day (QID) | ORAL | Status: DC | PRN
  Administered 2014-06-16: 1 mg via ORAL
  Filled 2014-06-16: qty 1

## 2014-06-16 MED ORDER — FOLIC ACID 1 MG PO TABS: 1.0000 mg | ORAL_TABLET | Freq: Every day | ORAL | Status: DC

## 2014-06-16 MED ORDER — LORAZEPAM 1 MG PO TABS
1.0000 mg | ORAL_TABLET | Freq: Two times a day (BID) | ORAL | Status: DC
  Administered 2014-06-16 (×2): 1 mg via ORAL
  Filled 2014-06-16: qty 1

## 2014-06-16 MED ORDER — LORAZEPAM 1 MG PO TABS: 0.0000 mg | ORAL_TABLET | Freq: Two times a day (BID) | ORAL | Status: DC

## 2014-06-16 MED ORDER — LORAZEPAM 1 MG PO TABS: 0.0000 mg | ORAL_TABLET | Freq: Four times a day (QID) | ORAL | Status: DC

## 2014-06-16 MED ORDER — LORAZEPAM 2 MG/ML IJ SOLN: 1.0000 mg | Freq: Four times a day (QID) | INTRAMUSCULAR | Status: DC | PRN

## 2014-06-16 NOTE — Evaluation (Signed)
Physical Therapy Evaluation Patient Details Name: [name] MRN: [id number] DOB: 02/01/1950 Today's Date: 06/16/2014   History of Present Illness  64 y.o. M Hx depression, bipolar disorder, anxiety, alcoholism, alcohol related seizure, CAD S/P PTCA, MVP, Diastolic Dysfxn, HTN, and Hyperlipidemia who presentsed to the ED with complaints of intermittent chest pain for 2 days.  Clinical Impression  Patient independent with mobility, ambulated increased distance in hall, no CP or difficulty with activity tolerance. Feel patient is safe for d/c home from mobility standpoint, no further acute PT needs. Will sign off.    Follow Up Recommendations No PT follow up    Equipment Recommendations  None recommended by PT    Recommendations for Other Services       Precautions / Restrictions Precautions Precautions: Fall Restrictions Weight Bearing Restrictions: No      Mobility  Bed Mobility Overal bed mobility: Independent                Transfers Overall transfer level: Independent                  Ambulation/Gait Ambulation/Gait assistance: Independent Ambulation Distance (Feet): 240 Feet Assistive device: None       General Gait Details: steady with ambulation  Stairs            Wheelchair Mobility    Modified Rankin (Stroke Patients Only)       Balance Overall balance assessment: History of Falls                                           Pertinent Vitals/Pain Pain Assessment: No/denies pain    Home Living Family/patient expects to be discharged to:: Private residence Living Arrangements: Alone Available Help at Discharge: Friend(s);Family;Available PRN/intermittently Type of Home: Apartment Home Access: Level entry     Home Layout: One level Home Equipment: Cane - single point;Tub bench      Prior Function Level of Independence: Independent         Comments: Pt reports he goes to gym at least 3 days/wk.  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, radio   Dominant Hand: Right    Extremity/Trunk Assessment   Upper Extremity Assessment: Overall WFL for tasks assessed           Lower Extremity Assessment: Overall WFL for tasks assessed         Communication   Communication: No difficulties  Cognition Arousal/Alertness: Awake/alert Behavior During Therapy: WFL for tasks assessed/performed Overall Cognitive Status: Within Functional Limits for tasks assessed                      General Comments      Exercises        Assessment/Plan    PT Assessment Patent does not need any further PT services  PT Diagnosis Difficulty walking   PT Problem List    PT Treatment Interventions     PT Goals (Current goals can be found in the Care Plan section) Acute Rehab PT Goals PT Goal Formulation: All assessment and education complete, DC therapy    Frequency     Barriers to discharge        Co-evaluation               End of Session   Activity  Tolerance: Patient tolerated treatment well Patient left: in chair;with call bell/phone within reach;with family/visitor present Nurse Communication: Mobility status         Time: [id number] PT Time Calculation (min) (ACUTE ONLY): 13 min   Charges:   PT Evaluation $Initial PT Evaluation Tier I: 1 Procedure     PT G CodesFabio [name]:        [name] 06/16/2014, 10:08 AM [name], PT DPT  [phone]

## 2014-06-16 NOTE — Progress Notes (Signed)
OT Cancellation Note  Patient Details Name: [name] MRN: [id number] DOB: [birth year]   Cancelled Treatment:    Reason Eval/Treat Not Completed: OT screened, no needs identified, will sign off. Pt independent with mobility and selfcare  Galen ManilaSpencer, Elira Colasanti Jeanette 06/16/2014, 11:24 AM

## 2014-06-16 NOTE — Discharge Summary (Signed)
Physician Discharge Summary  [name] BanDavid M Disano OZH:[id number] DOB: 26-Oct-1950 DOA: 06/12/2014  PCP: Thayer HeadingsMACKENZIE,BRIAN, MD  Admit date: 06/12/2014 Discharge date: 06/16/2014  Time spent: 40 minutes  Recommendations for Outpatient Follow-up:   Hyponatremi/ Potomania  -Due to Dehydration and Alcohol Abuse -Improved after hydration  -Follow-up with PCP in one week  -CSW who provide patient with information on [facility] prior to discharge.   Hypotension - Due to Dehydration and Overmedication -Resolved   Chest pain at rest -Troponins 3 negative -Continue aspirin 325 mg daily -Resolved  CAD (coronary artery disease) -Continue Cardiac Monitoring -Metoprolol 25 mg  QHS  -Cotninue Lipitor 40 mg daily -Lipid panel; within ADA guidelines except for low HDL  HTN (hypertension) -Metoprolol 25 mg  QHS   LVH  -stable  Alcohol abuse -[facility] when discharged  AKI (acute kidney injury) -Improved   Hyperlipidemia -Continue Lipitor 40 mg daily -Lipid panel; within ADA guidelines except for low HDL   Discharge Diagnoses:  Principal Problem:   Hyponatremia Active Problems:   CAD (coronary artery disease)   HTN (hypertension)   Hyperlipidemia   Chest pain at rest   Chronic diastolic heart failure   Alcohol abuse   Hypotension   AKI (acute kidney injury)   Dehydration   Acute kidney injury   Other specified hypotension   Coronary artery disease involving native coronary artery of native heart without angina pectoris   Essential hypertension   LVH (left ventricular hypertrophy)   Discharge Condition: Stable  Diet recommendation: Heart healthy  Filed Weights   06/13/14 0326  Weight: 85.3 kg (188 lb 0.8 oz)    History of present illness:  [name] is a 64 y.o. WM PMHx depression, bipolar disorder, anxiety, alcoholism, alcohol related seizure,  CAD S/P PTCA, MVP, Diastolic Dysfxn, HTN, Hyperlipidemia,   Presents to the ED with complaints of intermittent chest pain for the past 2 days . He denies SOB or nausea or vomiting. He reports that he has been heavily drinking since the loss of his brother in 12/2013. He last drank 1 days ago. He reports the he last drank 10 beers and 1 bottle of wine. He reports not eating or drinking very much for the past 2 days. When he arrived in the ED he was found to have hypotension with Systolic Blood Pressure of 60, and he was administered IVFs with improvement. His labs revealed hyponatremia with a sodium level of 108, and his initial troponin was negative.  While hospitalized patient was treated for alcoholism, alcohol-related seizures, and Potomania. Patient understands the sequela of continuing his alcoholism to include death, and has related that he will be [facility] when discharged    Procedure/Significant Events: 03/31/14 echocardiogram;Left ventricle: moderate focal basal and mild concentric hypertrophy.  -LVEF=60% to 65%. -(grade 1 diastolic dysfunction).    Discharge Exam: Filed Vitals:   06/15/14 1857 06/15/14 2129 06/16/14 0000 06/16/14 0600  BP: 137/82 146/89  133/77  Pulse: 93 80 82 68  Temp: 98.2 F (36.8 C) 98.4 F (36.9 C)  97.5 F (36.4 C)  TempSrc: Oral Oral  Oral  Resp: 16 18  20   Height:      Weight:      SpO2: 100% 99%  98%    General: A/O 4, NAD, No acute respiratory distress Eyes: Negative headache, eye pain, double vision, scotomas, floaters, negative retinal hemorrhage ENT: Negative Runny nose, negative ear pain, negative tinnitus, negative gingival bleeding,  Neck:  Negative scars, masses, torticollis, lymphadenopathy, JVD Lungs: Clear to auscultation bilaterally without wheezes or crackles Cardiovascular: Regular rhythm and rate without murmur gallop or rub normal S1 and S2 Abdomen: Positive abdominal pain to palpation  greatest RUQ/LLQ, negative dysphagia,, nondistended, soft, bowel sounds positive, no rebound, no ascites, no appreciable mass Extremities: No significant cyanosis, clubbing, or edema bilateral lower extremities Psychiatric: Positive depression, positive anxiety, negative fatigue, negative mania Neurologic: Cranial nerves II through XII intact, tongue/uvula midline, all extremities muscle strength 5/5, sensation intact throughout, negative dysarthria, negative expressive aphasia, negative receptive aphasia.   Discharge Instructions     Medication List    STOP taking these medications        chlordiazePOXIDE 25 MG capsule  Commonly known as:  LIBRIUM     hydrOXYzine 25 MG tablet  Commonly known as:  ATARAX/VISTARIL     LORazepam 1 MG tablet  Commonly known as:  ATIVAN     pantoprazole 40 MG tablet  Commonly known as:  PROTONIX      TAKE these medications        aspirin EC 325 MG tablet  Take 1 tablet (325 mg total) by mouth every morning. For blood thinner for heart health     atorvastatin 40 MG tablet  Commonly known as:  LIPITOR  Take 1 tablet (40 mg total) by mouth daily. For cholesterol     buPROPion 300 MG 24 hr tablet  Commonly known as:  WELLBUTRIN XL  Take 1 tablet (300 mg total) by mouth daily. For depression     folic acid 1 MG tablet  Commonly known as:  FOLVITE  Take 1 tablet (1 mg total) by mouth daily.     metoprolol tartrate 25 MG tablet  Commonly known as:  LOPRESSOR  Take 25 mg by mouth at bedtime.     multivitamin with minerals Tabs tablet  Take 1 tablet by mouth daily.     nitroGLYCERIN 0.4 MG SL tablet  Commonly known as:  NITROSTAT  Place 1 tablet (0.4 mg total) under the tongue every 5 (five) minutes as needed for chest pain (CP or SOB).     QUEtiapine 50 MG Tb24 24 hr tablet  Commonly known as:  SEROQUEL XR  Take 5 tablets (250 mg total) by mouth at bedtime.     thiamine 100 MG tablet  Take 1 tablet (100 mg total) by mouth daily.        Allergies  Allergen Reactions  . Ambien [Zolpidem Tartrate] Other (See Comments)    Sleep Walking  . Codeine Itching   Follow-up Information    Follow up with Thayer Headings, MD. Schedule an appointment as soon as possible for a visit in 1 week.   Specialty:  Internal Medicine   Why:  Schedule appointment 1 week after discharge with D Thayer Headings hospital follow-up Potomania, alcohol abuse,, LVH, CAD   Contact information:   [address] [place] [address] [phone]        The results of significant diagnostics from this hospitalization (including imaging, microbiology, ancillary and laboratory) are listed below for reference.    Significant Diagnostic Studies: Dg Chest Port 1 View  06/13/2014   CLINICAL DATA:  Chest pain.  Hypotension.  Symptoms for 1 day.  EXAM: PORTABLE CHEST - 1 VIEW  COMPARISON:  05/22/2014, 03/30/2014  FINDINGS: Lower lung volumes from prior exam. Cardiomegaly is unchanged allowing for differences in technique. There is no pulmonary edema, confluent airspace disease, large pleural effusion or  pneumothorax. Right proximal humeral prosthesis in place.  IMPRESSION: Stable cardiomegaly without acute or localizing pulmonary process.   Electronically Signed   By: Rubye Oaks M.D.   On: 06/13/2014 00:49   Dg Chest Port 1 View  05/22/2014   CLINICAL DATA:  Chest pain  EXAM: PORTABLE CHEST - 1 VIEW  COMPARISON:  03/30/2014  FINDINGS: Heart is upper limits normal in size. Mediastinal contours are within normal limits. No confluent airspace opacities or effusions. No acute bony abnormality. Prior right shoulder replacement.  IMPRESSION: No active disease.   Electronically Signed   By: Charlett Nose M.D.   On: 05/22/2014 14:04    Microbiology: No results found for this or any previous visit (from the past 240 hour(s)).   Labs: Basic Metabolic Panel:  Recent Labs Lab 06/12/14 2338 06/13/14 0830 06/14/14 0352 06/15/14 0230  06/16/14 0413  NA 108* 116* 127* 129* 130*  K 4.8 4.2 3.9 3.8 4.1  CL 75* 88* 101 101 103  CO2 15* 17* 19* 23 21*  GLUCOSE 76 82 98 123* 92  BUN 26* 22* 11 <5* <5*  CREATININE 2.40* 1.65* 1.11 0.89 0.78  CALCIUM 8.7* 8.2* 8.3* 8.3* 8.6*  MG  --   --  2.0 1.8  --    Liver Function Tests:  Recent Labs Lab 06/12/14 2346 06/14/14 0352 06/15/14 0230  AST 37 27 24  ALT ALKPHOS 70 69 67  BILITOT 0.6 0.5 0.6  PROT 6.3* 5.3* 5.2*  ALBUMIN 3.8 2.8* 2.7*    Recent Labs Lab 06/12/14 2346  LIPASE 24    Recent Labs Lab 06/14/14 0352 06/15/14 0230  AMMONIA 22 18   CBC:  Recent Labs Lab 06/12/14 2338 06/13/14 0830 06/14/14 0352 06/15/14 0230  WBC 9.5 8.8 5.5 5.2  NEUTROABS  --   --  3.1 2.4  HGB 10.2* 9.6* 9.1* 8.6*  HCT 29.6* 28.0* 26.9* 24.9*  MCV 78.5 76.9* 78.2 79.3  PLT 229 224 240 203   Cardiac Enzymes:  Recent Labs Lab 06/13/14 0428 06/13/14 0830 06/13/14 1453  TROPONINI <0.03 <0.03 <0.03   BNP: BNP (last 3 results)  Recent Labs  03/30/14 2346 05/22/14 1902 06/12/14 2338  BNP 64.6 61.5 75.8    ProBNP (last 3 results) No results for input(s): PROBNP in the last 8760 hours.  CBG:  Recent Labs Lab 06/13/14 0013 06/13/14 0113  GLUCAP 66 127*       Signed:  Carolyne Littles, MD [facility] [phone] pager

## 2014-07-15 ENCOUNTER — Encounter (HOSPITAL_COMMUNITY): Payer: Self-pay

## 2014-07-15 ENCOUNTER — Inpatient Hospital Stay (HOSPITAL_COMMUNITY)
Admission: EM | Admit: 2014-07-15 | Discharge: 2014-07-19 | DRG: 392 | Disposition: A | Discharge status: Home / Self Care | Payer: Medicare Other | Attending: Internal Medicine | Admitting: Internal Medicine

## 2014-07-15 DIAGNOSIS — E860 Dehydration: Secondary | ICD-10-CM | POA: Diagnosis present

## 2014-07-15 DIAGNOSIS — E785 Hyperlipidemia, unspecified: Secondary | ICD-10-CM | POA: Diagnosis present

## 2014-07-15 DIAGNOSIS — E871 Hypo-osmolality and hyponatremia: Secondary | ICD-10-CM

## 2014-07-15 DIAGNOSIS — F1020 Alcohol dependence, uncomplicated: Secondary | ICD-10-CM | POA: Diagnosis present

## 2014-07-15 DIAGNOSIS — F10930 Alcohol use, unspecified with withdrawal, uncomplicated: Secondary | ICD-10-CM

## 2014-07-15 DIAGNOSIS — Z8042 Family history of malignant neoplasm of prostate: Secondary | ICD-10-CM | POA: Diagnosis not present

## 2014-07-15 DIAGNOSIS — F319 Bipolar disorder, unspecified: Secondary | ICD-10-CM | POA: Diagnosis present

## 2014-07-15 DIAGNOSIS — N281 Cyst of kidney, acquired: Secondary | ICD-10-CM | POA: Diagnosis present

## 2014-07-15 DIAGNOSIS — R1084 Generalized abdominal pain: Secondary | ICD-10-CM | POA: Diagnosis present

## 2014-07-15 DIAGNOSIS — F17210 Nicotine dependence, cigarettes, uncomplicated: Secondary | ICD-10-CM | POA: Diagnosis present

## 2014-07-15 DIAGNOSIS — F10230 Alcohol dependence with withdrawal, uncomplicated: Secondary | ICD-10-CM

## 2014-07-15 DIAGNOSIS — I2510 Atherosclerotic heart disease of native coronary artery without angina pectoris: Secondary | ICD-10-CM | POA: Diagnosis present

## 2014-07-15 DIAGNOSIS — Z96643 Presence of artificial hip joint, bilateral: Secondary | ICD-10-CM | POA: Diagnosis present

## 2014-07-15 DIAGNOSIS — Z79899 Other long term (current) drug therapy: Secondary | ICD-10-CM

## 2014-07-15 DIAGNOSIS — R079 Chest pain, unspecified: Secondary | ICD-10-CM | POA: Diagnosis present

## 2014-07-15 DIAGNOSIS — Z96611 Presence of right artificial shoulder joint: Secondary | ICD-10-CM | POA: Diagnosis present

## 2014-07-15 DIAGNOSIS — Z7982 Long term (current) use of aspirin: Secondary | ICD-10-CM

## 2014-07-15 DIAGNOSIS — Z955 Presence of coronary angioplasty implant and graft: Secondary | ICD-10-CM | POA: Diagnosis not present

## 2014-07-15 DIAGNOSIS — G4700 Insomnia, unspecified: Secondary | ICD-10-CM | POA: Diagnosis present

## 2014-07-15 DIAGNOSIS — F1010 Alcohol abuse, uncomplicated: Secondary | ICD-10-CM | POA: Diagnosis present

## 2014-07-15 DIAGNOSIS — R197 Diarrhea, unspecified: Secondary | ICD-10-CM | POA: Diagnosis present

## 2014-07-15 DIAGNOSIS — Y901 Blood alcohol level of 20-39 mg/100 ml: Secondary | ICD-10-CM | POA: Diagnosis present

## 2014-07-15 DIAGNOSIS — R1011 Right upper quadrant pain: Secondary | ICD-10-CM | POA: Diagnosis not present

## 2014-07-15 DIAGNOSIS — K219 Gastro-esophageal reflux disease without esophagitis: Secondary | ICD-10-CM | POA: Diagnosis present

## 2014-07-15 DIAGNOSIS — R109 Unspecified abdominal pain: Secondary | ICD-10-CM | POA: Diagnosis not present

## 2014-07-15 DIAGNOSIS — I10 Essential (primary) hypertension: Secondary | ICD-10-CM | POA: Diagnosis present

## 2014-07-15 DIAGNOSIS — F1994 Other psychoactive substance use, unspecified with psychoactive substance-induced mood disorder: Secondary | ICD-10-CM | POA: Diagnosis not present

## 2014-07-15 DIAGNOSIS — F329 Major depressive disorder, single episode, unspecified: Secondary | ICD-10-CM | POA: Diagnosis not present

## 2014-07-15 LAB — RAPID URINE DRUG SCREEN, HOSP PERFORMED
Amphetamines: NOT DETECTED
Barbiturates: NOT DETECTED
Benzodiazepines: POSITIVE — AB
Cocaine: NOT DETECTED
OPIATES: NOT DETECTED
TETRAHYDROCANNABINOL: NOT DETECTED

## 2014-07-15 LAB — COMPREHENSIVE METABOLIC PANEL
ALK PHOS: 84 U/L (ref 38–126)
ALT: 18 U/L (ref 17–63)
AST: 25 U/L (ref 15–41)
Albumin: 5.1 g/dL — ABNORMAL HIGH (ref 3.5–5.0)
Anion gap: 17 — ABNORMAL HIGH (ref 5–15)
BILIRUBIN TOTAL: 0.6 mg/dL (ref 0.3–1.2)
BUN: 6 mg/dL (ref 6–20)
CHLORIDE: 84 mmol/L — AB (ref 101–111)
CO2: 18 mmol/L — AB (ref 22–32)
CREATININE: 0.91 mg/dL (ref 0.61–1.24)
Calcium: 10.2 mg/dL (ref 8.9–10.3)
GFR calc Af Amer: >60 mL/min (ref >60)
GFR calc non Af Amer: >60 mL/min (ref >60)
Glucose, Bld: 81 mg/dL (ref 65–99)
Potassium: 3.7 mmol/L (ref 3.5–5.1)
SODIUM: 119 mmol/L — AB (ref 135–145)
Total Protein: 8.2 g/dL — ABNORMAL HIGH (ref 6.5–8.1)

## 2014-07-15 LAB — CBC
HEMATOCRIT: 36.4 % — AB (ref 39.0–52.0)
Hemoglobin: 12.2 g/dL — ABNORMAL LOW (ref 13.0–17.0)
MCH: 26.4 pg (ref 26.0–34.0)
MCHC: 33.5 g/dL (ref 30.0–36.0)
MCV: 78.8 fL (ref 78.0–100.0)
PLATELETS: 301 10*3/uL (ref 150–400)
RBC: 4.62 MIL/uL (ref 4.22–5.81)
RDW: 15.3 % (ref 11.5–15.5)
WBC: 6.9 10*3/uL (ref 4.0–10.5)

## 2014-07-15 LAB — ACETAMINOPHEN LEVEL

## 2014-07-15 LAB — ETHANOL: Alcohol, Ethyl (B): 37 mg/dL — ABNORMAL HIGH (ref <5)

## 2014-07-15 LAB — SALICYLATE LEVEL

## 2014-07-15 MED ORDER — ONDANSETRON HCL 4 MG PO TABS: 4.0000 mg | ORAL_TABLET | Freq: Four times a day (QID) | ORAL | Status: DC | PRN

## 2014-07-15 MED ORDER — LORAZEPAM 1 MG PO TABS: 0.0000 mg | ORAL_TABLET | Freq: Four times a day (QID) | ORAL | Status: DC

## 2014-07-15 MED ORDER — ADULT MULTIVITAMIN W/MINERALS CH: 1.0000 | ORAL_TABLET | Freq: Every day | ORAL | Status: DC

## 2014-07-15 MED ORDER — BUPROPION HCL ER (XL) 300 MG PO TB24
300.0000 mg | ORAL_TABLET | Freq: Every day | ORAL | Status: DC
  Administered 2014-07-16 – 2014-07-19 (×4): 300 mg via ORAL
  Filled 2014-07-15 (×4): qty 1

## 2014-07-15 MED ORDER — LORAZEPAM 1 MG PO TABS
1.0000 mg | ORAL_TABLET | Freq: Four times a day (QID) | ORAL | Status: AC | PRN
  Administered 2014-07-16 – 2014-07-18 (×7): 1 mg via ORAL
  Filled 2014-07-15 (×7): qty 1

## 2014-07-15 MED ORDER — ATORVASTATIN CALCIUM 40 MG PO TABS
40.0000 mg | ORAL_TABLET | Freq: Every day | ORAL | Status: DC
  Administered 2014-07-16 – 2014-07-18 (×3): 40 mg via ORAL
  Filled 2014-07-15 (×4): qty 1

## 2014-07-15 MED ORDER — ACETAMINOPHEN 650 MG RE SUPP: 650.0000 mg | Freq: Four times a day (QID) | RECTAL | Status: DC | PRN

## 2014-07-15 MED ORDER — METOPROLOL TARTRATE 25 MG PO TABS
25.0000 mg | ORAL_TABLET | Freq: Every day | ORAL | Status: DC
  Administered 2014-07-15 – 2014-07-17 (×3): 25 mg via ORAL
  Filled 2014-07-15 (×4): qty 1

## 2014-07-15 MED ORDER — ONDANSETRON 8 MG PO TBDP: 8.0000 mg | ORAL_TABLET | Freq: Once | ORAL | Status: DC

## 2014-07-15 MED ORDER — QUETIAPINE FUMARATE ER 200 MG PO TB24
200.0000 mg | ORAL_TABLET | Freq: Every day | ORAL | Status: DC
  Administered 2014-07-15: 200 mg via ORAL
  Filled 2014-07-15 (×2): qty 1

## 2014-07-15 MED ORDER — LORAZEPAM 1 MG PO TABS: 0.0000 mg | ORAL_TABLET | Freq: Two times a day (BID) | ORAL | Status: DC

## 2014-07-15 MED ORDER — ADULT MULTIVITAMIN W/MINERALS CH
1.0000 | ORAL_TABLET | Freq: Every day | ORAL | Status: DC
  Administered 2014-07-16 – 2014-07-19 (×4): 1 via ORAL
  Filled 2014-07-15 (×4): qty 1

## 2014-07-15 MED ORDER — LORAZEPAM 2 MG/ML IJ SOLN
1.0000 mg | Freq: Once | INTRAMUSCULAR | Status: AC
  Administered 2014-07-15: 1 mg via INTRAVENOUS
  Filled 2014-07-15: qty 1

## 2014-07-15 MED ORDER — SODIUM CHLORIDE 0.9 % IV BOLUS (SEPSIS): 1000.0000 mL | Freq: Once | INTRAVENOUS | Status: DC

## 2014-07-15 MED ORDER — ACETAMINOPHEN 325 MG PO TABS
650.0000 mg | ORAL_TABLET | Freq: Four times a day (QID) | ORAL | Status: DC | PRN
  Administered 2014-07-17 – 2014-07-19 (×3): 650 mg via ORAL
  Filled 2014-07-15 (×3): qty 2

## 2014-07-15 MED ORDER — NITROGLYCERIN 0.4 MG SL SUBL: 0.4000 mg | SUBLINGUAL_TABLET | SUBLINGUAL | Status: DC | PRN

## 2014-07-15 MED ORDER — LORAZEPAM 2 MG/ML IJ SOLN: 0.0000 mg | Freq: Two times a day (BID) | INTRAMUSCULAR | Status: DC

## 2014-07-15 MED ORDER — FOLIC ACID 1 MG PO TABS
1.0000 mg | ORAL_TABLET | Freq: Every day | ORAL | Status: DC
  Administered 2014-07-16 – 2014-07-19 (×4): 1 mg via ORAL
  Filled 2014-07-15 (×4): qty 1

## 2014-07-15 MED ORDER — VITAMIN B-1 100 MG PO TABS
100.0000 mg | ORAL_TABLET | Freq: Every day | ORAL | Status: DC
  Administered 2014-07-16 – 2014-07-19 (×4): 100 mg via ORAL
  Filled 2014-07-15 (×4): qty 1

## 2014-07-15 MED ORDER — THIAMINE HCL 100 MG/ML IJ SOLN
100.0000 mg | Freq: Every day | INTRAMUSCULAR | Status: DC
  Filled 2014-07-15 (×2): qty 1

## 2014-07-15 MED ORDER — ONDANSETRON HCL 4 MG/2ML IJ SOLN
4.0000 mg | Freq: Four times a day (QID) | INTRAMUSCULAR | Status: DC | PRN
  Filled 2014-07-15 (×3): qty 2

## 2014-07-15 MED ORDER — LORAZEPAM 2 MG/ML IJ SOLN
1.0000 mg | Freq: Four times a day (QID) | INTRAMUSCULAR | Status: AC | PRN
  Filled 2014-07-15: qty 1

## 2014-07-15 MED ORDER — LORAZEPAM 2 MG/ML IJ SOLN
0.0000 mg | Freq: Four times a day (QID) | INTRAMUSCULAR | Status: AC
  Administered 2014-07-15 – 2014-07-17 (×3): 2 mg via INTRAVENOUS
  Filled 2014-07-15 (×3): qty 1

## 2014-07-15 MED ORDER — ASPIRIN EC 325 MG PO TBEC
325.0000 mg | DELAYED_RELEASE_TABLET | Freq: Every morning | ORAL | Status: DC
  Administered 2014-07-16 – 2014-07-19 (×4): 325 mg via ORAL
  Filled 2014-07-15 (×4): qty 1

## 2014-07-15 MED ORDER — ONDANSETRON HCL 4 MG/2ML IJ SOLN
4.0000 mg | Freq: Once | INTRAMUSCULAR | Status: AC
  Administered 2014-07-15: 4 mg via INTRAVENOUS
  Filled 2014-07-15: qty 2

## 2014-07-15 MED ORDER — SODIUM CHLORIDE 0.9 % IV SOLN
INTRAVENOUS | Status: DC
  Administered 2014-07-15: via INTRAVENOUS

## 2014-07-15 MED ORDER — QUETIAPINE FUMARATE ER 50 MG PO TB24
50.0000 mg | ORAL_TABLET | Freq: Every day | ORAL | Status: DC
  Filled 2014-07-15: qty 1

## 2014-07-15 NOTE — ED Notes (Signed)
Per EMS- Patient is requesting detox from ETOH, Percocet, and Xanax. Patient reported a beer one hours ago and Percocet 5mg /375 mg 2 hours ago. Patient also c/o generalized abdominal pain, N/V, and diarrhea.

## 2014-07-15 NOTE — ED Provider Notes (Signed)
CSN: [id number]     Arrival date & time 07/15/14  1746 History   First MD Initiated Contact with Patient 07/15/14 1848     Chief Complaint  Patient presents with  . Addiction Problem     (Consider location/radiation/quality/duration/timing/severity/associated sxs/prior Treatment) HPI Comments: Patient presents to the emergency department with chief complaint of alcohol and substance abuse. He is requesting detox from alcohol, Xanax, and Percocet. States that has last drink was today, last Xanax was yesterday.  Patient states that he has been feeling tremulous, has had abdominal cramps, nausea, vomiting, diarrhea. There are no aggravating or relieving factors.  The history is provided by the patient. No language interpreter was used.    Past Medical History  Diagnosis Date  . MVP (mitral valve prolapse)   . Anxiety   . Hypertension   . Hyperlipidemia   . Palpitations   . Bipolar disorder   . Depression   . Esophagitis   . Gastritis   . Insomnia   . Hypercholesteremia   . Coronary artery disease     a.  s/p PTCA to PL branch of RCA in 2007;  b.  Last LHC 04/2008:  LM with irregularities, LAD 50%, mid LAD 50%, proximal D1 80-90%, proximal OM1 30-40%, proximal OM2 50%, PLB branch 30-40%, site of prior angioplasty in the RCA widely patent, EF 65%=>Med Rx;  c. Nuclear study a 12/2011: EF 71% and normal perfusion.    Marland Kitchen History of echocardiogram     Echo 08/16/09: Mild LVH, EF 55-60%, no normal wall motion, grade 1 diastolic dysfunction  . Anginal pain   . Pneumonia 2005  . GERD (gastroesophageal reflux disease)   . Arthritis     "hands; especially in cold weather" (12/11/2013)  . 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 Coronary angioplasty  2007    RCA  . Cardiac catheterization  04/2008    EF 65%  . Knee arthroscopy, medial patello femoral ligament repair    . Hip arthroplasty Bilateral     "both replaced twice"   .  Total shoulder replacement Right   . Total hip revision  02/07/2012    Procedure: TOTAL HIP REVISION;  Surgeon: [name], MD;  Location: [facility] OR;  Service: Orthopedics;  Laterality: Left;  [place] Hip Revision Set  . Total knee arthroplasty Right   . Appendectomy  1958  . Tonsillectomy  1958  . Joint replacement     Family History  Problem Relation Age of Onset  . Heart disease Neg Hx   . Prostate cancer Father    History  Substance Use Topics  . Smoking status: Current Every Day Smoker -- 0.50 packs/day for 40 years    Types: Cigarettes  . Smokeless tobacco: Never Used  . Alcohol Use: 0.0 oz/week     Comment: patient drinks 2-3 times a week    Review of Systems  Constitutional: Negative for fever and chills.  Respiratory: Negative for shortness of breath.   Cardiovascular: Negative for chest pain.  Gastrointestinal: Positive for nausea and vomiting. Negative for diarrhea and constipation.  Genitourinary: Negative for dysuria.  All other systems reviewed and are negative.     Allergies  Ambien and Codeine  Home Medications   Prior to Admission medications   Medication Sig Start Date End Date Taking? Authorizing Provider  acetaminophen (TYLENOL) 325 MG tablet Take 1,300 mg by mouth every 6 (six)  hours as needed for mild pain, moderate pain, fever or headache.   Yes Historical Provider, MD  ALPRAZolam (XANAX XR) 2 MG 24 hr tablet Take 2 mg by mouth at bedtime.   Yes Historical Provider, MD  aspirin EC 325 MG tablet Take 1 tablet (325 mg total) by mouth every morning. For blood thinner for heart health 04/02/14  Yes Alison Murray, MD  atorvastatin (LIPITOR) 40 MG tablet Take 1 tablet (40 mg total) by mouth daily. For cholesterol 10/24/12  Yes Sanjuana Kava, NP  buPROPion (WELLBUTRIN XL) 300 MG 24 hr tablet Take 1 tablet (300 mg total) by mouth daily. For depression 10/24/12  Yes Sanjuana Kava, NP  metoprolol tartrate (LOPRESSOR) 25 MG tablet Take 25 mg by mouth at bedtime.    Yes Historical Provider, MD  Multiple Vitamin (MULTIVITAMIN WITH MINERALS) TABS tablet Take 1 tablet by mouth daily. 04/02/14  Yes Alison Murray, MD  nitroGLYCERIN (NITROSTAT) 0.4 MG SL tablet Place 1 tablet (0.4 mg total) under the tongue every 5 (five) minutes as needed for chest pain (CP or SOB). 10/24/12  Yes Sanjuana Kava, NP  QUEtiapine (SEROQUEL XR) 50 MG TB24 24 hr tablet Take 5 tablets (250 mg total) by mouth at bedtime. Patient taking differently: Take 50-200 mg by mouth 2 (two) times daily. Take 1 Tablet (50 mg) in the am and Take 4 Tablets (200 mg) in the pm 08/15/13  Yes Charm Rings, NP  folic acid (FOLVITE) 1 MG tablet Take 1 tablet (1 mg total) by mouth daily. Patient not taking: Reported on 07/15/2014 06/16/14   Drema Dallas, MD  thiamine 100 MG tablet Take 1 tablet (100 mg total) by mouth daily. Patient not taking: Reported on 06/13/2014 04/02/14   Alison Murray, MD   BP 135/95 mmHg  Pulse 81  Temp(Src) 98.4 F (36.9 C) (Oral)  Resp 16  Ht 5\' 10"  (1.778 m)  Wt 170 lb (77.111 kg)  BMI 24.39 kg/m2  SpO2 100% Physical Exam  Constitutional: He is oriented to person, place, and time. He appears well-developed and well-nourished.  HENT:  Head: Normocephalic and atraumatic.  Eyes: Conjunctivae and EOM are normal. Pupils are equal, round, and reactive to light. Right eye exhibits no discharge. Left eye exhibits no discharge. No scleral icterus.  Neck: Normal range of motion. Neck supple. No JVD present.  Cardiovascular: Normal rate, regular rhythm and normal heart sounds.  Exam reveals no gallop and no friction rub.   No murmur heard. Pulmonary/Chest: Effort normal and breath sounds normal. No respiratory distress. He has no wheezes. He has no rales. He exhibits no tenderness.  Abdominal: Soft. He exhibits no distension and no mass. There is no tenderness. There is no rebound and no guarding.  No focal abdominal tenderness, no RLQ tenderness or pain at McBurney's point, no RUQ  tenderness or Murphy's sign, no left-sided abdominal tenderness, no fluid wave, or signs of peritonitis   Musculoskeletal: Normal range of motion. He exhibits no edema or tenderness.  Neurological: He is alert and oriented to person, place, and time.  Skin: Skin is warm and dry.  Psychiatric: He has a normal mood and affect. His behavior is normal. Judgment and thought content normal.  Nursing note and vitals reviewed.   ED Course  Procedures (including critical care time) Results for orders placed or performed during the hospital encounter of 07/15/14  Acetaminophen level  Result Value Ref Range   Acetaminophen (Tylenol), Serum <10 (L) 10 -  30 ug/mL  CBC  Result Value Ref Range   WBC 6.9 4.0 - 10.5 K/uL   RBC 4.62 4.22 - 5.81 MIL/uL   Hemoglobin 12.2 (L) 13.0 - 17.0 g/dL   HCT 16.1 (L) 09.6 - 04.5 %   MCV 78.8 78.0 - 100.0 fL   MCH 26.4 26.0 - 34.0 pg   MCHC 33.5 30.0 - 36.0 g/dL   RDW 40.9 81.1 - 91.4 %   Platelets 301 150 - 400 K/uL  Comprehensive metabolic panel  Result Value Ref Range   Sodium 119 (LL) 135 - 145 mmol/L   Potassium 3.7 3.5 - 5.1 mmol/L   Chloride 84 (L) 101 - 111 mmol/L   CO2 18 (L) 22 - 32 mmol/L   Glucose, Bld 81 65 - 99 mg/dL   BUN 6 6 - 20 mg/dL   Creatinine, Ser 7.82 0.61 - 1.24 mg/dL   Calcium 95.6 8.9 - 21.3 mg/dL   Total Protein 8.2 (H) 6.5 - 8.1 g/dL   Albumin 5.1 (H) 3.5 - 5.0 g/dL   AST 25 15 - 41 U/L   ALT 18 17 - 63 U/L   Alkaline Phosphatase 84 38 - 126 U/L   Total Bilirubin 0.6 0.3 - 1.2 mg/dL   GFR calc non Af Amer >60 >60 mL/min   GFR calc Af Amer >60 >60 mL/min   Anion gap 17 (H) 5 - 15  Ethanol (ETOH)  Result Value Ref Range   Alcohol, Ethyl (B) 37 (H) <5 mg/dL  Salicylate level  Result Value Ref Range   Salicylate Lvl <4.0 2.8 - 30.0 mg/dL  Urine rapid drug screen (hosp performed)not at [facility]  Result Value Ref Range   Opiates NONE DETECTED NONE DETECTED   Cocaine NONE DETECTED NONE DETECTED   Benzodiazepines POSITIVE (A)  NONE DETECTED   Amphetamines NONE DETECTED NONE DETECTED   Tetrahydrocannabinol NONE DETECTED NONE DETECTED   Barbiturates NONE DETECTED NONE DETECTED   No results found.    EKG Interpretation None      MDM   Final diagnoses:  Hyponatremia  Alcohol withdrawal, uncomplicated    Patient requesting detox from alcohol, Xanax, and Percocet. Triage labs were ordered. Will give patient some Ativan for his tremors. CIWA score is 10.  Sodium is 119, patient will likely require admission for alcoholic withdrawal. He is mentating appropriately and has no evidence of delirium.  Patient discussed with Dr. Fayrene Fearing, who agrees plan for admission.    Roxy Horseman, PA-C 07/15/14 0865  Rolland Porter, MD 07/24/14 [phone]

## 2014-07-15 NOTE — ED Notes (Signed)
Bed: [facility]4 Expected date:  Expected time:  Means of arrival:  Comments: EMS/24M/detox

## 2014-07-15 NOTE — ED Notes (Signed)
Na = 119  Notified primary nurse

## 2014-07-15 NOTE — H&P (Signed)
[facility] History and Physical  MATTHIEW MOURA HWK:[id number] DOB: 1950-08-18 DOA: 07/15/2014  Referring physician: Mr. Dahlia Client. PCP: Thayer Headings, MD  Specialists: None.  Chief Complaint: Abdominal pain and chest pain.  HPI: [name] is a 64 y.o. male with history of alcoholism, CAD status post PTCA, hyperlipidemia presents to the ER because of abdominal pain and nausea vomiting and chest pain. Patient states he has been having these symptoms over last 2 days with abdominal pain which is generalized crampy colicky in nature with multiple episodes of nausea and vomiting unable to keep anything. Patient also has been having persistent chest pain which is tight squeezing type is present even at rest. In addition patient also wants to be detoxed from pain medication. Patient states his last alcohol drink was yesterday. Patient's labs revealed severe hyponatremia.  Review of Systems: As presented in the history of presenting illness, rest negative.  Past Medical History  Diagnosis Date  . MVP (mitral valve prolapse)   . Anxiety   . Hypertension   . Hyperlipidemia   . Palpitations   . Bipolar disorder   . Depression   . Esophagitis   . Gastritis   . Insomnia   . Hypercholesteremia   . Coronary artery disease     a.  s/p PTCA to PL branch of RCA in 2007;  b.  Last LHC 04/2008:  LM with irregularities, LAD 50%, mid LAD 50%, proximal D1 80-90%, proximal OM1 30-40%, proximal OM2 50%, PLB branch 30-40%, site of prior angioplasty in the RCA widely patent, EF 65%=>Med Rx;  c. Nuclear study a 12/2011: EF 71% and normal perfusion.    Marland Kitchen History of echocardiogram     Echo 08/16/09: Mild LVH, EF 55-60%, no normal wall motion, grade 1 diastolic dysfunction  . Anginal pain   . Pneumonia 2005  . GERD (gastroesophageal reflux disease)   . Arthritis     "hands; especially in cold weather" (12/11/2013)  . Alcohol related seizure     "when I go off it"   Past Surgical History  Procedure  Laterality Date  . Coronary angioplasty  2007    RCA  . Cardiac catheterization  04/2008    EF 65%  . Knee arthroscopy, medial patello femoral ligament repair    . Hip arthroplasty Bilateral     "both replaced twice"   . Total shoulder replacement Right   . Total hip revision  02/07/2012    Procedure: TOTAL HIP REVISION;  Surgeon: [name], MD;  Location: [facility] OR;  Service: Orthopedics;  Laterality: Left;  [place] Hip Revision Set  . Total knee arthroplasty Right   . Appendectomy  1958  . Tonsillectomy  1958  . Joint replacement     Social History:  reports that he has been smoking Cigarettes.  He has a 20 pack-year smoking history. He has never used smokeless tobacco. He reports that he drinks alcohol. He reports that he uses illicit drugs (Benzodiazepines) about 7 times per week. Where does patient live home. Can patient participate in ADLs? Yes.  Allergies  Allergen Reactions  . Ambien [Zolpidem Tartrate] Other (See Comments)    Sleep Walking  . Codeine Itching    Family History:  Family History  Problem Relation Age of Onset  . Heart disease Neg Hx   . Prostate cancer Father       Prior to Admission medications   Medication Sig Start Date End Date Taking? Authorizing Provider  acetaminophen (TYLENOL) 325 MG tablet Take  1,300 mg by mouth every 6 (six) hours as needed for mild pain, moderate pain, fever or headache.   Yes Historical Provider, MD  ALPRAZolam (XANAX XR) 2 MG 24 hr tablet Take 2 mg by mouth at bedtime.   Yes Historical Provider, MD  aspirin EC 325 MG tablet Take 1 tablet (325 mg total) by mouth every morning. For blood thinner for heart health 04/02/14  Yes Alison Murray, MD  atorvastatin (LIPITOR) 40 MG tablet Take 1 tablet (40 mg total) by mouth daily. For cholesterol 10/24/12  Yes Sanjuana Kava, NP  buPROPion (WELLBUTRIN XL) 300 MG 24 hr tablet Take 1 tablet (300 mg total) by mouth daily. For depression 10/24/12  Yes Sanjuana Kava, NP  metoprolol tartrate  (LOPRESSOR) 25 MG tablet Take 25 mg by mouth at bedtime.   Yes Historical Provider, MD  Multiple Vitamin (MULTIVITAMIN WITH MINERALS) TABS tablet Take 1 tablet by mouth daily. 04/02/14  Yes Alison Murray, MD  nitroGLYCERIN (NITROSTAT) 0.4 MG SL tablet Place 1 tablet (0.4 mg total) under the tongue every 5 (five) minutes as needed for chest pain (CP or SOB). 10/24/12  Yes Sanjuana Kava, NP  QUEtiapine (SEROQUEL XR) 50 MG TB24 24 hr tablet Take 5 tablets (250 mg total) by mouth at bedtime. Patient taking differently: Take 50-200 mg by mouth 2 (two) times daily. Take 1 Tablet (50 mg) in the am and Take 4 Tablets (200 mg) in the pm 08/15/13  Yes Charm Rings, NP  folic acid (FOLVITE) 1 MG tablet Take 1 tablet (1 mg total) by mouth daily. Patient not taking: Reported on 07/15/2014 06/16/14   Drema Dallas, MD  thiamine 100 MG tablet Take 1 tablet (100 mg total) by mouth daily. Patient not taking: Reported on 06/13/2014 04/02/14   Alison Murray, MD    Physical Exam: Filed Vitals:   07/15/14 1752 07/15/14 1809 07/15/14 2027 07/15/14 2215  BP:  152/97 152/97 135/95  Pulse:  81 81 81  Temp:  98.4 F (36.9 C)    TempSrc:  Oral    Resp:  16    Height:   (1.778 m)    Weight:  77.111 kg (170 lb)    SpO2: 98% 100%       General:  Moderately built and nourished.  Eyes: Anicteric no pallor.  ENT: No discharge from the ears eyes nose and mouth.  Neck: No mass felt.  Cardiovascular: S1 and S2 heard.  Respiratory: No rhonchi or crepitations.  Abdomen: Soft nontender bowel sounds present.  Skin: No rash.  Musculoskeletal: No edema.  Psychiatric: Appears normal.  Neurologic: Alert awake oriented to time place and person. Moves all extremities.  Labs on Admission:  Basic Metabolic Panel:  Recent Labs Lab 07/15/14 2041  NA 119*  K 3.7  CL 84*  CO2 18*  GLUCOSE 81  BUN 6  CREATININE 0.91  CALCIUM 10.2   Liver Function Tests:  Recent Labs Lab 07/15/14 2041  AST 25  ALT  18  ALKPHOS 84  BILITOT 0.6  PROT 8.2*  ALBUMIN 5.1*   No results for input(s): LIPASE, AMYLASE in the last 168 hours. No results for input(s): AMMONIA in the last 168 hours. CBC:  Recent Labs Lab 07/15/14 2041  WBC 6.9  HGB 12.2*  HCT 36.4*  MCV 78.8  PLT 301   Cardiac Enzymes: No results for input(s): CKTOTAL, CKMB, CKMBINDEX, TROPONINI in the last 168 hours.  BNP (last 3 results)  Recent Labs  03/30/14 2346 05/22/14 1902 06/12/14 2338  BNP 64.6 61.5 75.8    ProBNP (last 3 results) No results for input(s): PROBNP in the last 8760 hours.  CBG: No results for input(s): GLUCAP in the last 168 hours.  Radiological Exams on Admission: No results found.  EKG: Independently reviewed. Normal sinus rhythm with nonspecific ST-T changes. First-degree AV block.  Assessment/Plan Principal Problem:   Abdominal pain Active Problems:   HTN (hypertension)   Hyponatremia   Chest pain   Alcohol abuse   1. Abdominal pain with nausea vomiting and diarrhea - given the history of alcoholism primarily we have to rule out pancreatitis for which I have ordered lipase and CT abdomen and pelvis. For now I will keep patient on clear liquid diet and antiemetics and gentle hydration. 2. Chest pain with history of PTCA in 2007 - chest pain appears atypical and is persistent. I have ordered chest x-ray and we will cycle cardiac markers and consult cardiology. 3. Severe hyponatremia - probably from poor oral intake and also alcohol intake. Check urine studies. Since patient has had poor oral intake for now I will keep patient on gentle hydration and closely follow metabolic panel. 4. History of hypertension - continue present medications. 5. Hyperlipidemia - continue present medications. 6. Alcohol abuse - alcohol withdrawal protocol.   DVT Prophylaxis SCDs.  Code Status: Full code.  Family Communication: Discussed with patient.  Disposition Plan: Admit to inpatient.     Inesha Sow N. [facility] Pager [phone].  If 7PM-7AM, please contact night-coverage [email or web address] Password [email or web address] [facility] 07/15/2014, 10:54 PM

## 2014-07-15 NOTE — ED Notes (Addendum)
Patient states he came to the ED for c/o abdominal pain, vomiting, generalized pain and detox from ETOH, Xanax, and Percocet. Patient also has tremors x 2 days. Patient states he has been cutting back on his intake of ETOH, and Xanax.

## 2014-07-15 NOTE — ED Notes (Signed)
Unsuccessful lab draw x2. 

## 2014-07-16 ENCOUNTER — Inpatient Hospital Stay (HOSPITAL_COMMUNITY): Payer: Medicare Other

## 2014-07-16 ENCOUNTER — Encounter (HOSPITAL_COMMUNITY): Payer: Self-pay | Admitting: Cardiology

## 2014-07-16 DIAGNOSIS — R1011 Right upper quadrant pain: Secondary | ICD-10-CM

## 2014-07-16 LAB — LIPASE, BLOOD: LIPASE: 41 U/L (ref 22–51)

## 2014-07-16 LAB — BASIC METABOLIC PANEL
ANION GAP: 14 (ref 5–15)
ANION GAP: 15 (ref 5–15)
ANION GAP: 11 (ref 5–15)
BUN: 7 mg/dL (ref 6–20)
BUN: 9 mg/dL (ref 6–20)
BUN: 9 mg/dL (ref 6–20)
CALCIUM: 9.6 mg/dL (ref 8.9–10.3)
CHLORIDE: 88 mmol/L — AB (ref 101–111)
CHLORIDE: 87 mmol/L — AB (ref 101–111)
CHLORIDE: 93 mmol/L — AB (ref 101–111)
CO2: 19 mmol/L — AB (ref 22–32)
CO2: 19 mmol/L — AB (ref 22–32)
CO2: 22 mmol/L (ref 22–32)
CREATININE: 0.94 mg/dL (ref 0.61–1.24)
CREATININE: 1.02 mg/dL (ref 0.61–1.24)
Calcium: 9.4 mg/dL (ref 8.9–10.3)
Calcium: 9 mg/dL (ref 8.9–10.3)
Creatinine, Ser: 0.81 mg/dL (ref 0.61–1.24)
GFR calc Af Amer: >60 mL/min (ref >60)
GFR calc Af Amer: >60 mL/min (ref >60)
GFR calc non Af Amer: >60 mL/min (ref >60)
GFR calc non Af Amer: >60 mL/min (ref >60)
GFR calc non Af Amer: >60 mL/min (ref >60)
GLUCOSE: 78 mg/dL (ref 65–99)
GLUCOSE: 131 mg/dL — AB (ref 65–99)
Glucose, Bld: 77 mg/dL (ref 65–99)
Potassium: 3.9 mmol/L (ref 3.5–5.1)
Potassium: 3.8 mmol/L (ref 3.5–5.1)
Potassium: 4.1 mmol/L (ref 3.5–5.1)
SODIUM: 121 mmol/L — AB (ref 135–145)
Sodium: 121 mmol/L — ABNORMAL LOW (ref 135–145)
Sodium: 126 mmol/L — ABNORMAL LOW (ref 135–145)

## 2014-07-16 LAB — TSH: TSH: 0.924 u[IU]/mL (ref 0.350–4.500)

## 2014-07-16 LAB — TROPONIN I
Troponin I: <0.03 ng/mL (ref <0.031)
Troponin I: <0.03 ng/mL (ref <0.031)

## 2014-07-16 LAB — CBC
HEMATOCRIT: 33.9 % — AB (ref 39.0–52.0)
HEMOGLOBIN: 11.3 g/dL — AB (ref 13.0–17.0)
MCH: 26.7 pg (ref 26.0–34.0)
MCHC: 33.3 g/dL (ref 30.0–36.0)
MCV: 80.1 fL (ref 78.0–100.0)
Platelets: 293 10*3/uL (ref 150–400)
RBC: 4.23 MIL/uL (ref 4.22–5.81)
RDW: 15.4 % (ref 11.5–15.5)
WBC: 6.9 10*3/uL (ref 4.0–10.5)

## 2014-07-16 LAB — OSMOLALITY, URINE: Osmolality, Ur: 366 mOsm/kg — ABNORMAL LOW (ref 390–1090)

## 2014-07-16 LAB — SODIUM, URINE, RANDOM: SODIUM UR: 40 mmol/L

## 2014-07-16 MED ORDER — IOHEXOL 300 MG/ML  SOLN
100.0000 mL | Freq: Once | INTRAMUSCULAR | Status: AC | PRN
  Administered 2014-07-16: 100 mL via INTRAVENOUS

## 2014-07-16 MED ORDER — ONDANSETRON HCL 4 MG/2ML IJ SOLN
4.0000 mg | Freq: Once | INTRAMUSCULAR | Status: AC
  Administered 2014-07-16: 4 mg via INTRAVENOUS

## 2014-07-16 MED ORDER — SODIUM CHLORIDE 0.9 % IV SOLN: INTRAVENOUS | Status: DC

## 2014-07-16 MED ORDER — PROMETHAZINE HCL 25 MG/ML IJ SOLN
12.5000 mg | Freq: Once | INTRAMUSCULAR | Status: AC
  Administered 2014-07-16: 12.5 mg via INTRAVENOUS
  Filled 2014-07-16: qty 1

## 2014-07-16 MED ORDER — PANTOPRAZOLE SODIUM 40 MG PO TBEC
40.0000 mg | DELAYED_RELEASE_TABLET | Freq: Every day | ORAL | Status: DC
  Administered 2014-07-16 – 2014-07-19 (×4): 40 mg via ORAL
  Filled 2014-07-16 (×5): qty 1

## 2014-07-16 MED ORDER — IOHEXOL 300 MG/ML  SOLN
25.0000 mL | INTRAMUSCULAR | Status: AC
  Administered 2014-07-16 (×2): 25 mL via ORAL

## 2014-07-16 MED ORDER — NITROGLYCERIN 0.4 MG SL SUBL: 0.4000 mg | SUBLINGUAL_TABLET | SUBLINGUAL | Status: DC | PRN

## 2014-07-16 MED ORDER — ENOXAPARIN SODIUM 40 MG/0.4ML ~~LOC~~ SOLN
40.0000 mg | SUBCUTANEOUS | Status: DC
  Administered 2014-07-16 – 2014-07-19 (×4): 40 mg via SUBCUTANEOUS
  Filled 2014-07-16 (×4): qty 0.4

## 2014-07-16 MED ORDER — QUETIAPINE FUMARATE ER 50 MG PO TB24
100.0000 mg | ORAL_TABLET | Freq: Every day | ORAL | Status: DC
  Administered 2014-07-16 – 2014-07-18 (×3): 100 mg via ORAL
  Filled 2014-07-16 (×4): qty 2

## 2014-07-16 NOTE — Progress Notes (Addendum)
[facility] PROGRESS NOTE  [name] ZOX:[id number] DOB: 12/05/1950 DOA: 07/15/2014 PCP: Thayer Headings, MD   Chief Complaint: Abdominal pain and chest pain. HPI: [name] is a 64 y.o. male with history of alcoholism, CAD status post PTCA, hyperlipidemia presented to the ER 6/22 requesting detox from ETOH, xanax and percocet and also complaining of abdominal pain and nausea vomiting and chest pain. Patient stated he has been having these symptoms over last 2 days with abdominal pain which is generalized crampy colicky in nature with multiple episodes of nausea and vomiting unable to keep anything. Patient also has been having persistent chest pain which is tight squeezing type is present even at rest. In addition patient also wants to be detoxed from pain medication. Patient states his last alcohol drink was 6/22. Patient's labs revealed severe hyponatremia  Assessment/Plan: 1. Abdominal pain with nausea vomiting  - improved, CT Abd ordered by Dr.kakrakandy will FU  -exam benign at this time, WBC, LFts, lipase WNL  -supportive care for now,  clear liquid diet and antiemetics, PPI, IVF.  2. Chest pain, h/o CAD with PTCA in 2007  - atypical and resolved, continue PPI; suspect GI in origin - troponin negative -continue ASA, metoprolol, statin  3. 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 History of hypertension  - continue metoprolol . 5. Alcohol abuse -thiamine, CIWA protocol -CSw consult, was placed on [facility] wait list after DC in May -goes to AA meetings  DVT Prophylaxis add lovenox.   Code Status: Full Code Family Communication: none at bedside Disposition Plan: home when improved     HPI/Subjective: Feels ok, some  nausea, no further abd pain, c/o some shakes  Objective: Filed Vitals:   07/16/14 0604  BP: 107/77  Pulse: 87  Temp: 98.2 F (36.8 C)  Resp: 16    Intake/Output Summary (Last 24 hours) at 07/16/14 0902 Last data filed at 07/16/14 0853  Gross per 24 hour  Intake 408.75 ml  Output   1000 ml  Net -591.25 ml   Filed Weights   07/15/14 1809  Weight: 77.111 kg (170 lb)    Exam:   General:  AAOx3, no distress, avg built  HEENT: no icterus  Cardiovascular: S1S2/RRR  Respiratory: CTAB  Abdomen: soft, NT, BS present  Musculoskeletal: no edema  Neuro: minimal tremors, no asterixes   Data Reviewed: Basic Metabolic Panel:  Recent Labs Lab 07/15/14 2041 07/16/14 0100 07/16/14 0530  NA 119* 121* 121*  K 3.7 3.9 3.8  CL 84* 88* 87*  CO2 18* 19* 19*  GLUCOSE 81 78 77  BUN CREATININE 0.91 0.81 0.94  CALCIUM 10.2 9.6 9.4   Liver Function Tests:  Recent Labs Lab 07/15/14 2041  AST 25  ALT 18  ALKPHOS 84  BILITOT 0.6  PROT 8.2*  ALBUMIN 5.1*    Recent Labs Lab 07/16/14 0100  LIPASE 41   No results for input(s): AMMONIA in the last 168 hours. CBC:  Recent Labs Lab 07/15/14 2041 07/16/14 0530  WBC 6.9 6.9  HGB 12.2* 11.3*  HCT 36.4* 33.9*  MCV 78.8 80.1  PLT 301 293   Cardiac Enzymes:  Recent Labs Lab 07/16/14 0100 07/16/14 0530  TROPONINI <0.03 <0.03   BNP (last 3 results)  Recent Labs  03/30/14 2346 05/22/14 1902 06/12/14 2338  BNP 64.6 61.5 75.8    ProBNP (last 3 results) No results for input(s): PROBNP in the last 8760 hours.  CBG: No results for input(s): GLUCAP in the last 168 hours.  No results found for this or any previous visit (from the past 240 hour(s)).   Studies: Dg Chest Port 1 View  07/16/2014   CLINICAL DATA:  Substance abuse.  EXAM: PORTABLE CHEST - 1 VIEW  COMPARISON:  Chest radiograph 06/13/2014  FINDINGS: Monitoring leads overlie the patient. Stable cardiac and mediastinal contours. Low lung  volumes. Minimal left basilar atelectasis. No pleural effusion pneumothorax. Right shoulder arthroplasty.  IMPRESSION: No acute cardiopulmonary process.  Left basilar atelectasis.   Electronically Signed   By: Annia Belt M.D.   On: 07/16/2014 03:03    Scheduled Meds: . aspirin EC  325 mg Oral q morning - 10a  . atorvastatin  40 mg Oral q1800  . buPROPion  300 mg Oral Daily  . folic acid  1 mg Oral Daily  . LORazepam  0-4 mg Intravenous 4 times per day   Followed by  . [START ON 07/18/2014] LORazepam  0-4 mg Intravenous Q12H  . metoprolol tartrate  25 mg Oral QHS  . multivitamin with minerals  1 tablet Oral Daily  . QUEtiapine  200 mg Oral QHS  . QUEtiapine  50 mg Oral Daily  . thiamine  100 mg Oral Daily   Or  . thiamine  100 mg Intravenous Daily   Continuous Infusions: . sodium chloride     Antibiotics Given (last 72 hours)    None      Principal Problem:   Abdominal pain Active Problems:   HTN (hypertension)   Hyponatremia   Chest pain   Alcohol abuse    Time spent:    [facility]  [facility] Pager [phone]. If 7PM-7AM, please contact night-coverage at [email or web address], password [email or web address] 07/16/2014, 9:02 AM  LOS: 1 day

## 2014-07-16 NOTE — Progress Notes (Signed)
Clinical Social Work  CSW received referral in order to assess for substance abuse. CSW went to room but patient sleeping and did not awaken when CSW called his name or tapped his shoulder. CSW will follow up at later time when patient able to fully participate in assessment.  [place], [place] [phone]

## 2014-07-16 NOTE — Consult Note (Signed)
Reason for Consult: chest pain in pt with known CAD and stent   Referring Physician: Dr. Broadus John   PCP:  Thressa Sheller, MD  Primary Cardiologist:Dr. [place]  [name] is an 64 y.o. male.    Chief Complaint:  admitted 07/15/14 pm for abd and chest pain.     HPI: 64 y.o. male with history of alcoholism, CAD status post PTCA, hyperlipidemia presents to the ER because of abdominal pain and nausea vomiting and chest pain. Also hx of BiPolar disorder.  Patient states he has been having these symptoms over last 2 days with abdominal pain which is generalized crampy colicky in nature with multiple episodes of nausea and vomiting unable to keep anything. Patient also has been having persistent chest pain which is tight squeezing type is present even at rest. In addition patient also wants to be detoxed from pain medication. Patient states his last alcohol drink was yesterday. Patient's labs revealed severe hyponatremia- he had hyponatremia with admit 08/2012.    His hx of CAD: s/p angioplasty of the RCA in 2007, MVP, HTN, HL. Last LHC 04/2008: LM with irregularities, LAD 50%, mid LAD 50%, proximal D1 80-90%, proximal OM1 30-40%, proximal OM2 50%, PLB branch 30-40%, site of prior angioplasty in the RCA widely patent, EF 65%. It was felt that the bulk of the disease was within small branches that are not suitable for intervention. Continued medical therapy was recommended at that time. Echo 08/16/09: Mild LVH, EF 55-60%, no normal wall motion, grade 1 diastolic dysfunction.ECHO 03/31/14 EF 60-65%with normal wall motion, G1DD.   Nuclear study a 12/2011: EF 71% and normal perfusion. pt with admit with chest pain 05/22/14 neg MI.   Since admit his troponins are negative <0.03, X 2.  Na now 121 up from 119. EKG: Sinus rhythm with 1st degree A-V block Inferior infarct , age undetermined No significant change since last tracing 06/13/14   Currently:no chest pain but continued abd discomfort.   Today he stated his abd and chest pain came on all at once while he was watching TV he thinks.  The chest pain felt like a brick on his chest a dull pressure.  No diaphoresis or SOB.  Similar to episode in April.  His troponins are negative.  Past Medical History  Diagnosis Date  . MVP (mitral valve prolapse)   . Anxiety   . Hypertension   . Hyperlipidemia   . Palpitations   . Bipolar disorder   . Depression   . Esophagitis   . Gastritis   . Insomnia   . Hypercholesteremia   . Coronary artery disease     a.  s/p PTCA to PL branch of RCA in 2007;  b.  Last LHC 04/2008:  LM with irregularities, LAD 50%, mid LAD 50%, proximal D1 80-90%, proximal OM1 30-40%, proximal OM2 50%, PLB branch 30-40%, site of prior angioplasty in the RCA widely patent, EF 65%=>Med Rx;  c. Nuclear study a 12/2011: EF 71% and normal perfusion.    Marland Kitchen History of echocardiogram     Echo 08/16/09: Mild LVH, EF 55-60%, no normal wall motion, grade 1 diastolic dysfunction  . Anginal pain   . Pneumonia 2005  . GERD (gastroesophageal reflux disease)   . Arthritis     "hands; especially in cold weather" (12/11/2013)  . Alcohol related seizure     "when I go off it"    Past Surgical History  Procedure Laterality Date  . Coronary  angioplasty  2007    RCA  . Cardiac catheterization  04/2008    EF 65%  . Knee arthroscopy, medial patello femoral ligament repair    . Hip arthroplasty Bilateral     "both replaced twice"   . Total shoulder replacement Right   . Total hip revision  02/07/2012    Procedure: TOTAL HIP REVISION;  Surgeon: Kerin Salen, MD;  Location: [facility];  Service: Orthopedics;  Laterality: Left;  [place] Hip Revision Set  . Total knee arthroplasty Right   . Appendectomy  1958  . Tonsillectomy  1958  . Joint replacement      Family History  Problem Relation Age of Onset  . Heart disease Neg Hx   . Prostate cancer Father    Social History:  reports that he has been smoking Cigarettes.  He has a 20  pack-year smoking history. He has never used smokeless tobacco. He reports that he drinks alcohol. He reports that he uses illicit drugs (Benzodiazepines) about 7 times per week.  Allergies:  Allergies  Allergen Reactions  . Ambien [Zolpidem Tartrate] Other (See Comments)    Sleep Walking  . Codeine Itching    OUTPATIENT MEDICATIONS: No current facility-administered medications on file prior to encounter.   Current Outpatient Prescriptions on File Prior to Encounter  Medication Sig Dispense Refill  . aspirin EC 325 MG tablet Take 1 tablet (325 mg total) by mouth every morning. For blood thinner for heart health 30 tablet 0  . atorvastatin (LIPITOR) 40 MG tablet Take 1 tablet (40 mg total) by mouth daily. For cholesterol 30 tablet 0  . buPROPion (WELLBUTRIN XL) 300 MG 24 hr tablet Take 1 tablet (300 mg total) by mouth daily. For depression 30 tablet 0  . metoprolol tartrate (LOPRESSOR) 25 MG tablet Take 25 mg by mouth at bedtime.    . Multiple Vitamin (MULTIVITAMIN WITH MINERALS) TABS tablet Take 1 tablet by mouth daily. 30 tablet 0  . nitroGLYCERIN (NITROSTAT) 0.4 MG SL tablet Place 1 tablet (0.4 mg total) under the tongue every 5 (five) minutes as needed for chest pain (CP or SOB). 15 tablet 0  . QUEtiapine (SEROQUEL XR) 50 MG TB24 24 hr tablet Take 5 tablets (250 mg total) by mouth at bedtime. (Patient taking differently: Take 50-200 mg by mouth 2 (two) times daily. Take 1 Tablet (50 mg) in the am and Take 4 Tablets (200 mg) in the pm) 790 each 0  . folic acid (FOLVITE) 1 MG tablet Take 1 tablet (1 mg total) by mouth daily. (Patient not taking: Reported on 07/15/2014) 30 tablet 0  . thiamine 100 MG tablet Take 1 tablet (100 mg total) by mouth daily. (Patient not taking: Reported on 06/13/2014) 30 tablet 0   Current medications: Scheduled Meds: . aspirin EC  325 mg Oral q morning - 10a  . atorvastatin  40 mg Oral q1800  . buPROPion  300 mg Oral Daily  . enoxaparin (LOVENOX) injection  40  mg Subcutaneous Q24H  . folic acid  1 mg Oral Daily  . LORazepam  0-4 mg Intravenous 4 times per day   Followed by  . [START ON 07/18/2014] LORazepam  0-4 mg Intravenous Q12H  . metoprolol tartrate  25 mg Oral QHS  . multivitamin with minerals  1 tablet Oral Daily  . pantoprazole  40 mg Oral Q1200  . QUEtiapine  100 mg Oral QHS  . thiamine  100 mg Oral Daily   Or  . thiamine  100 mg Intravenous Daily   Continuous Infusions: . sodium chloride     PRN Meds:.acetaminophen **OR** acetaminophen, LORazepam **OR** LORazepam, nitroGLYCERIN, ondansetron **OR** ondansetron (ZOFRAN) IV   Results for orders placed or performed during the hospital encounter of 07/15/14 (from the past 48 hour(s))  Urine rapid drug screen (hosp performed)not at [facility]     Status: Abnormal   Collection Time: 07/15/14  6:40 PM  Result Value Ref Range   Opiates NONE DETECTED NONE DETECTED   Cocaine NONE DETECTED NONE DETECTED   Benzodiazepines POSITIVE (A) NONE DETECTED   Amphetamines NONE DETECTED NONE DETECTED   Tetrahydrocannabinol NONE DETECTED NONE DETECTED   Barbiturates NONE DETECTED NONE DETECTED    Comment:        DRUG SCREEN FOR MEDICAL PURPOSES ONLY.  IF CONFIRMATION IS NEEDED FOR ANY PURPOSE, NOTIFY LAB WITHIN 5 DAYS.        LOWEST DETECTABLE LIMITS FOR URINE DRUG SCREEN Drug Class       Cutoff (ng/mL) Amphetamine      1000 Barbiturate      200 Benzodiazepine   960 Tricyclics       454 Opiates          300 Cocaine          300 THC              50   Acetaminophen level     Status: Abnormal   Collection Time: 07/15/14  8:40 PM  Result Value Ref Range   Acetaminophen (Tylenol), Serum <10 (L) 10 - 30 ug/mL    Comment:        THERAPEUTIC CONCENTRATIONS VARY SIGNIFICANTLY. A RANGE OF 10-30 ug/mL MAY BE AN EFFECTIVE CONCENTRATION FOR MANY PATIENTS. HOWEVER, SOME ARE BEST TREATED AT CONCENTRATIONS OUTSIDE THIS RANGE. ACETAMINOPHEN CONCENTRATIONS >150 ug/mL AT 4 HOURS AFTER INGESTION AND >50  ug/mL AT 12 HOURS AFTER INGESTION ARE OFTEN ASSOCIATED WITH TOXIC REACTIONS.   Ethanol (ETOH)     Status: Abnormal   Collection Time: 07/15/14  8:40 PM  Result Value Ref Range   Alcohol, Ethyl (B) 37 (H) <5 mg/dL    Comment:        LOWEST DETECTABLE LIMIT FOR SERUM ALCOHOL IS 5 mg/dL FOR MEDICAL PURPOSES ONLY   Salicylate level     Status: None   Collection Time: 07/15/14  8:40 PM  Result Value Ref Range   Salicylate Lvl <0.9 2.8 - 30.0 mg/dL  CBC     Status: Abnormal   Collection Time: 07/15/14  8:41 PM  Result Value Ref Range   WBC 6.9 4.0 - 10.5 K/uL   RBC 4.62 4.22 - 5.81 MIL/uL   Hemoglobin 12.2 (L) 13.0 - 17.0 g/dL   HCT 36.4 (L) 39.0 - 52.0 %   MCV 78.8 78.0 - 100.0 fL   MCH 26.4 26.0 - 34.0 pg   MCHC 33.5 30.0 - 36.0 g/dL   RDW 15.3 11.5 - 15.5 %   Platelets 301 150 - 400 K/uL  Comprehensive metabolic panel     Status: Abnormal   Collection Time: 07/15/14  8:41 PM  Result Value Ref Range   Sodium 119 (LL) 135 - 145 mmol/L    Comment: REPEATED TO VERIFY CRITICAL RESULT CALLED TO, READ BACK BY AND VERIFIED WITHRamond Marrow CAMPBELL,RN [id number] @ 2118 BY J SCOTTON    Potassium 3.7 3.5 - 5.1 mmol/L   Chloride 84 (L) 101 - 111 mmol/L   CO2 18 (L) 22 - 32 mmol/L   Glucose,  Bld 81 65 - 99 mg/dL   BUN 6 6 - 20 mg/dL   Creatinine, Ser 0.91 0.61 - 1.24 mg/dL   Calcium 10.2 8.9 - 10.3 mg/dL   Total Protein 8.2 (H) 6.5 - 8.1 g/dL   Albumin 5.1 (H) 3.5 - 5.0 g/dL   AST 25 15 - 41 U/L   ALT 18 17 - 63 U/L   Alkaline Phosphatase 84 38 - 126 U/L   Total Bilirubin 0.6 0.3 - 1.2 mg/dL   GFR calc non Af Amer >60 >60 mL/min   GFR calc Af Amer >60 >60 mL/min    Comment: (NOTE) The eGFR has been calculated using the CKD EPI equation. This calculation has not been validated in all clinical situations. eGFR's persistently <60 mL/min signify possible Chronic Kidney Disease.    Anion gap 17 (H) 5 - 15  Sodium, urine, random     Status: None   Collection Time: 07/15/14 10:36 PM    Result Value Ref Range   Sodium, Ur 40 mmol/L    Comment: Performed at [facility]  Osmolality, urine     Status: Abnormal   Collection Time: 07/15/14 10:36 PM  Result Value Ref Range   Osmolality, Ur 366 (L) 390 - 1090 mOsm/kg    Comment: Performed at [facility]  Troponin I (q 6hr x 3)     Status: None   Collection Time: 07/16/14  1:00 AM  Result Value Ref Range   Troponin I <0.03 <0.031 ng/mL    Comment:        NO INDICATION OF MYOCARDIAL INJURY.   Basic metabolic panel     Status: Abnormal   Collection Time: 07/16/14  1:00 AM  Result Value Ref Range   Sodium 121 (L) 135 - 145 mmol/L   Potassium 3.9 3.5 - 5.1 mmol/L   Chloride 88 (L) 101 - 111 mmol/L   CO2 19 (L) 22 - 32 mmol/L   Glucose, Bld 78 65 - 99 mg/dL   BUN 7 6 - 20 mg/dL   Creatinine, Ser 0.81 0.61 - 1.24 mg/dL   Calcium 9.6 8.9 - 10.3 mg/dL   GFR calc non Af Amer >60 >60 mL/min   GFR calc Af Amer >60 >60 mL/min    Comment: (NOTE) The eGFR has been calculated using the CKD EPI equation. This calculation has not been validated in all clinical situations. eGFR's persistently <60 mL/min signify possible Chronic Kidney Disease.    Anion gap 14 5 - 15  TSH     Status: None   Collection Time: 07/16/14  1:00 AM  Result Value Ref Range   TSH 0.924 0.350 - 4.500 uIU/mL  Lipase, blood     Status: None   Collection Time: 07/16/14  1:00 AM  Result Value Ref Range   Lipase 41 22 - 51 U/L  Troponin I (q 6hr x 3)     Status: None   Collection Time: 07/16/14  5:30 AM  Result Value Ref Range   Troponin I <0.03 <0.031 ng/mL    Comment:        NO INDICATION OF MYOCARDIAL INJURY.   Basic metabolic panel     Status: Abnormal   Collection Time: 07/16/14  5:30 AM  Result Value Ref Range   Sodium 121 (L) 135 - 145 mmol/L   Potassium 3.8 3.5 - 5.1 mmol/L   Chloride 87 (L) 101 - 111 mmol/L   CO2 19 (L) 22 - 32 mmol/L   Glucose,  Bld 77 65 - 99 mg/dL   BUN 9 6 - 20 mg/dL   Creatinine, Ser 0.94 0.61 -  1.24 mg/dL   Calcium 9.4 8.9 - 10.3 mg/dL   GFR calc non Af Amer >60 >60 mL/min   GFR calc Af Amer >60 >60 mL/min    Comment: (NOTE) The eGFR has been calculated using the CKD EPI equation. This calculation has not been validated in all clinical situations. eGFR's persistently <60 mL/min signify possible Chronic Kidney Disease.    Anion gap 15 5 - 15  CBC     Status: Abnormal   Collection Time: 07/16/14  5:30 AM  Result Value Ref Range   WBC 6.9 4.0 - 10.5 K/uL   RBC 4.23 4.22 - 5.81 MIL/uL   Hemoglobin 11.3 (L) 13.0 - 17.0 g/dL   HCT 33.9 (L) 39.0 - 52.0 %   MCV 80.1 78.0 - 100.0 fL   MCH 26.7 26.0 - 34.0 pg   MCHC 33.3 30.0 - 36.0 g/dL   RDW 15.4 11.5 - 15.5 %   Platelets 293 150 - 400 K/uL   Dg Chest Port 1 View  07/16/2014   CLINICAL DATA:  Substance abuse.  EXAM: PORTABLE CHEST - 1 VIEW  COMPARISON:  Chest radiograph 06/13/2014  FINDINGS: Monitoring leads overlie the patient. Stable cardiac and mediastinal contours. Low lung volumes. Minimal left basilar atelectasis. No pleural effusion pneumothorax. Right shoulder arthroplasty.  IMPRESSION: No acute cardiopulmonary process.  Left basilar atelectasis.   Electronically Signed   By: Lovey Newcomer M.D.   On: 07/16/2014 03:03    ROS: General:no colds or fevers, weights as listed below up and down Skin:no rashes or ulcers HEENT:no blurred vision, no congestion CV:see HPI PUL:see HPI GI:+ diarrhea yesterday, no constipation or melena, no indigestion GU:no hematuria, no dysuria MS:no joint pain, no claudication Neuro:no syncope, no lightheadedness Endo:no diabetes, no thyroid disease   Blood pressure 107/77, pulse 87, temperature 98.2 F (36.8 C), temperature source Oral, resp. rate 16, height _0  (1.778 m), weight 170 lb (77.111 kg), SpO2 98 %.  Wt Readings from Last 3 Encounters:  07/15/14 170 lb (77.111 kg)  06/13/14 188 lb 0.8 oz (85.3 kg)  05/22/14 179 lb (81.194 kg)    PE: General:Pleasant affect though subdued,  NAD Skin:Warm and dry, brisk capillary refill HEENT:normocephalic, sclera clear, mucus membranes moist Neck:supple, no JVD, no bruits, no adenopathy  Heart:S1S2 RRR without murmur, gallup, rub or click Lungs:clear without rales, rhonchi, or wheezes CMK:LKJZ, mild diffuse tenderness, + BS, do not palpate liver spleen or masses Ext:no lower ext edema, 2+ pedal pulses, 2+ radial pulses Neuro:alert and oriented X 3, MAE, follows commands, + facial symmetry Tele: SR with long 1st degree block.    Assessment/Plan Principal Problem:   Abdominal pain- mild pain now, headed down for CT  Active Problems:   --HTN (hypertension)- labile 152/97 to 107/77- previously on toprol XL 50 mg daily now on lopressor 25 mg once daily  -- Hyponatremia- improving    --Chest pain- neg troponin and has now resolved.  Possible radiation of abd pain. No change on EKG  -- Alcohol abuse  -- CAD /p angioplasty of the RCA in 2007, MVP, HTN, HL. Last LHC 04/2008: LM with irregularities, LAD 50%, mid LAD 50%, proximal D1 80-90%, proximal OM1 30-40%, proximal OM2 50%, PLB branch 30-40%, site of prior angioplasty in the RCA widely patent, Last nuc 2013 without ischemia.    [place]  Nurse Practitioner Certified [facility] Pager [phone] or after 5pm or weekends call [phone] 07/16/2014, 9:36 AM  History and all data above reviewed.  Patient examined.  I agree with the findings as above.  Patient reports that he presented for detox.  He does report some chest pain but he denies that currently.  He reports that he hurts all over and his chest is "just part of the abdominal pain."  He says that he exercises routinely and that he was doing this recently and he did well with that without any symptoms.  The patient exam reveals COR:RRR, soft systolic murmur  ,  Lungs: Clear  ,  Abd: Positive bowel sounds, no rebound no guarding,, Ext no edema  .  All available labs, radiology testing, previous records  reviewed. Agree with documented assessment and plan. Chest pain:  Atypical.  No objective evidence of ischemia.  No further cardiac work up.  Please call with further questions.   Jeneen Rinks Mellina Benison  10:45 AM  07/16/2014

## 2014-07-17 DIAGNOSIS — I10 Essential (primary) hypertension: Secondary | ICD-10-CM

## 2014-07-17 LAB — BASIC METABOLIC PANEL
Anion gap: 6 (ref 5–15)
BUN: 9 mg/dL (ref 6–20)
CO2: 22 mmol/L (ref 22–32)
Calcium: 9 mg/dL (ref 8.9–10.3)
Chloride: 99 mmol/L — ABNORMAL LOW (ref 101–111)
Creatinine, Ser: 0.87 mg/dL (ref 0.61–1.24)
GFR calc Af Amer: >60 mL/min (ref >60)
GFR calc non Af Amer: >60 mL/min (ref >60)
Glucose, Bld: 105 mg/dL — ABNORMAL HIGH (ref 65–99)
Potassium: 4 mmol/L (ref 3.5–5.1)
SODIUM: 127 mmol/L — AB (ref 135–145)

## 2014-07-17 LAB — CBC
HCT: 30.5 % — ABNORMAL LOW (ref 39.0–52.0)
HEMOGLOBIN: 10.1 g/dL — AB (ref 13.0–17.0)
MCH: 26.9 pg (ref 26.0–34.0)
MCHC: 33.1 g/dL (ref 30.0–36.0)
MCV: 81.1 fL (ref 78.0–100.0)
Platelets: 231 10*3/uL (ref 150–400)
RBC: 3.76 MIL/uL — ABNORMAL LOW (ref 4.22–5.81)
RDW: 15.7 % — ABNORMAL HIGH (ref 11.5–15.5)
WBC: 6.6 10*3/uL (ref 4.0–10.5)

## 2014-07-17 MED ORDER — SODIUM CHLORIDE 0.9 % IV SOLN
INTRAVENOUS | Status: DC
  Administered 2014-07-17: 21:00:00 via INTRAVENOUS

## 2014-07-17 MED ORDER — SODIUM CHLORIDE 0.9 % IV BOLUS (SEPSIS)
250.0000 mL | Freq: Once | INTRAVENOUS | Status: AC
  Administered 2014-07-17: 250 mL via INTRAVENOUS

## 2014-07-17 NOTE — Clinical Social Work Note (Signed)
Clinical Social Work Assessment  Patient Details  Name: [name] MRN: [id number] Date of Birth: 1950-08-09  Date of referral:  07/17/14               Reason for consult:  Substance Use/ETOH Abuse                Permission sought to share information with:    Permission granted to share information::  No  Name::     N/A  Agency::  N/A  Relationship::  N/A  Contact Information:  N/A  Housing/Transportation Living arrangements for the past 2 months:  Apartment Source of Information:  Patient Patient Interpreter Needed:  None Criminal Activity/Legal Involvement Pertinent to Current Situation/Hospitalization:  No - Comment as needed Significant Relationships:  Friend Lives with:  Self Do you feel safe going back to the place where you live?  Yes Need for family participation in patient care:  No (Coment)  Care giving concerns:  None reported. Patient lives alone but has strong friend support to assist if needed.   Social Worker assessment / plan:   CSW received referral to assess for substance abuse. CSW reviewed chart and met with patient at bedside. CSW introduced myself and explained role.  Patient laying in bed and agreeable to assessment. Patient reports that he lives home alone in [place] but has strong friend support. Patient is currently disabled but received accounting degree from [employer] in the past. Patient identifies as homosexual and reports that he has never been married and does not have children. Patient reports he is not opposed to being married but has not found the right person. Patient's parents passed away several years ago and patient's younger brother passed away 2014-01-20. Patient has an older brother who lives in [place] but he has not had contact with him in a couple of years. Patient spoke about parents divorcing when he was younger and having to care for his younger brother. Patient reports that he and older brother were on good terms but brother drinks  alcohol and sometimes goes years without having any communication. Patient is grieving loss of brother and recounted memories of soothing younger brother and comforting him after parents divorced.  Patient reports that he started going to [facility] in 2007 for medication management. Patient is currently taking Wellbutrin and feels it is effective. Patient reports extreme depression about twice a year but has learned to manage his symptoms by exercising, socializing with friends, and seeking therapy when needed. Patient is currently receiving individual and group therapy through Hospice to deal with grief of brother's death. Brother died of a heart attack and it was not expected so patient felt traumatized by learning of his death. Patient reports that he feels sad due to loss but does not feel his chronic depression has been affected.  CSW inquired about patient's substance use. Patient reports he used to drink large amounts of alcohol in the past but now only drinks socially. Patient used example of going on trip in the next couple of months to visit [place] with friends and reported that he would want to drink with them. Patient denies ever using alcohol to manage depression. Patient reports he has gone years without drinking alcohol but now enjoys drinking a mixed drink or wine with friends. Patient completed SBIRT but reports that he is not interested in any treatment at this time. Patient reports he does not plan on drinking any alcohol at DC but still plans to drink socially.  Patient thanked CSW for visit but reports no needs at this time. Patient reports that he will have friends from church come to visit and does not require any additional support at this time.  Employment status:  Disabled (Comment on whether or not currently receiving Disability) Insurance information:  Medicare PT Recommendations:  Not assessed at this time Information / Referral to community resources:  SBIRT, Outpatient  Substance Abuse Treatment Options  Patient/Family's Response to care:  Patient open to assessment. Patient fixated on getting regular diet to eat and reports he is feeling better. Patient reports he enjoys having someone to talk with and is hopeful to feel better soon so he can return back home.  Patient/Family's Understanding of and Emotional Response to Diagnosis, Current Treatment, and Prognosis:  Patient reports he understands that medical condition would be negatively affected by alcohol use. Patient guarded when discussing alcohol use and reports he does not need to talk about treatment options because alcohol use is not a concern for him. Patient engaged during assessment and reports he enjoyed talking about his family and future plans but does not feel any SW intervention is needed.  Emotional Assessment Appearance:  Disheveled Attitude/Demeanor/Rapport:  Other Affect (typically observed):  Accepting Orientation:  Oriented to Self, Oriented to Place, Oriented to  Time, Oriented to Situation Alcohol / Substance use:  Alcohol Use Psych involvement (Current and /or in the community):  No (Comment)  Discharge Needs  Concerns to be addressed:  Substance Abuse Concerns, Grief and Loss Concerns Readmission within the last 30 days:  No Current discharge risk:  Substance Abuse Barriers to Discharge:  No Barriers Identified   Boone Master, [place] 07/17/2014, 10:50 AM  [phone]

## 2014-07-17 NOTE — Progress Notes (Signed)
[facility] PROGRESS NOTE  [name] ZOX:[id number] DOB: Aug 01, 1950 DOA: 07/15/2014 PCP: Thayer Headings, MD   Chief Complaint: Abdominal pain and chest pain. HPI: [name] is a 64 y.o. male with history of alcoholism, CAD status post PTCA, hyperlipidemia presented to the ER 6/22 requesting detox from ETOH, xanax and percocet and also complaining of abdominal pain and nausea vomiting and chest pain. Patient stated he has been having these symptoms over last 2 days with abdominal pain which is generalized crampy colicky in nature with multiple episodes of nausea and vomiting unable to keep anything. Patient also has been having persistent chest pain which is tight squeezing type is present even at rest. In addition patient also wants to be detoxed from pain medication. Patient states his last alcohol drink was 6/22. Patient's labs revealed severe hyponatremia  Assessment/Plan: 1. Abdominal pain with nausea vomiting  - improved, CT Abd with bilateral renal lesions, complex cysts vs masses-needs Urology FU for this, no acute findings, symptoms could have been related to withdrawal  -exam benign at this time, WBC, LFts, lipase WNL  -supportive care, advance diet, cut down IVF, PPI.  2. Chest pain, h/o CAD with PTCA in 2007  - atypical and resolved, continue PPI; suspect GI in origin - troponin negative -continue ASA, metoprolol, statin -cards was consulted by admitter yesterday and didn't feel he needed further cardiac workup  3. 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 -Urine studies c/w SIADH  (urine sodium 40 and osm 366) but repsonding to IVF at this time, will cut down IVF, continue for 6hours -i cut down seroquel dose -monitor urine output, bmet in am  4. History of hypertension  - continue metoprolol . 5. Alcohol abuse -thiamine, CIWA protocol -very mild  withdrawal at this time -CSw consult, was placed on [facility] wait list after DC in May -goes to AA meetings  Ambulate, PT eval  DVT Prophylaxis  lovenox.   Code Status: Full Code Family Communication: none at bedside Disposition Plan: home when improved     HPI/Subjective: Feels much better, still having some shakes and got ativan x2 this am, no nausea, no further abd pain  Objective: Filed Vitals:   07/17/14 0513  BP: 98/66  Pulse: 73  Temp: 98.3 F (36.8 C)  Resp: 18    Intake/Output Summary (Last 24 hours) at 07/17/14 1038 Last data filed at 07/17/14 0743  Gross per 24 hour  Intake 2281.67 ml  Output   1400 ml  Net 881.67 ml   Filed Weights   07/15/14 1809  Weight: 77.111 kg (170 lb)    Exam:   General:  AAOx3, no distress, avg built  HEENT: no icterus  Cardiovascular: S1S2/RRR, faint systolic murmur  Respiratory: CTAB  Abdomen: soft, NT, BS present  Musculoskeletal: no edema  Neuro: minimal tremors, no asterixes   Data Reviewed: Basic Metabolic Panel:  Recent Labs Lab 07/15/14 2041 07/16/14 0100 07/16/14 0530 07/16/14 1910 07/17/14 0700  NA 119* 121* 121* 126* 127*  K 3.7 3.9 3.8 4.1 4.0  CL 84* 88* 87* 93* 99*  CO2 18* 19* 19* 22 22  GLUCOSE 81 78 77 131* 105*  BUN CREATININE 0.91 0.81 0.94 1.02 0.87  CALCIUM 10.2 9.6 9.4 9.0 9.0   Liver Function Tests:  Recent Labs Lab 07/15/14 2041  AST 25  ALT 18  ALKPHOS 84  BILITOT 0.6  PROT 8.2*  ALBUMIN 5.1*    Recent Labs Lab 07/16/14 0100  LIPASE 41   No results for input(s): AMMONIA in the last 168 hours. CBC:  Recent Labs Lab 07/15/14 2041 07/16/14 0530 07/17/14 0700  WBC 6.9 6.9 6.6  HGB 12.2* 11.3* 10.1*  HCT 36.4* 33.9* 30.5*  MCV 78.8 80.1 81.1  PLT 301 293 231   Cardiac Enzymes:  Recent Labs Lab 07/16/14 0100 07/16/14 0530 07/16/14 1330  TROPONINI <0.03 <0.03 <0.03   BNP (last 3 results)  Recent Labs  03/30/14 2346 05/22/14 1902  06/12/14 2338  BNP 64.6 61.5 75.8    ProBNP (last 3 results) No results for input(s): PROBNP in the last 8760 hours.  CBG: No results for input(s): GLUCAP in the last 168 hours.  No results found for this or any previous visit (from the past 240 hour(s)).   Studies: Ct Abdomen Pelvis W Contrast  07/16/2014   CLINICAL DATA:  Generalized abdominal pain. Nausea vomiting and diarrhea.  EXAM: CT ABDOMEN AND PELVIS WITH CONTRAST  TECHNIQUE: Multidetector CT imaging of the abdomen and pelvis was performed using the standard protocol following bolus administration of intravenous contrast.  CONTRAST:  OMNIPAQUE IOHEXOL 300 MG/ML  SOLN  COMPARISON:  01/11/2013  FINDINGS: Lower chest: The lung bases appear clear. No pleural or pericardial effusions.  Hepatobiliary: Interval improvement in previously noted hepatic steatosis. A few small foci of low-attenuation noted. These are too small to characterize measuring up to 9 mm, image 12/series 2. The gallbladder appears normal. No biliary dilatation.  Pancreas: Normal appearance of the pancreas.  Spleen: Normal appearance of the spleen.  Adrenals/Urinary Tract: The adrenal glands are on unremarkable. 2.2 cm hyperdense lesion arises from the upper pole the right kidney, image 16/series 2. On previous noncontrast CT this measured 36 Hounsfield units. On today's exam this measures 75 Hounsfield units. There is a small right renal cyst which measures 11 mm. 1.5 cm exophytic lesion arising from the inferior pole of the right kidney noted. This measures 20 Hounsfield units. Previously this lesion measured 11 mm and 39.7 Hounsfield units. There is a hyperdense lesion arising from the posterior left kidney measuring 1.3 cm and 64 Hounsfield units, image 26/series 2. Previously this measured 1.7 cm and 27 Hounsfield units. Two simple appearing cysts are noted within the inferior pole of the left kidney. Bladder detail diminished secondary to beam hardening artifact from  bilateral hip arthroplasty devices.  Stomach/Bowel: The stomach appears normal. The small bowel loops have a normal course and caliber. Appendix negative. Normal appearance of the colon.  Vascular/Lymphatic: Calcified atherosclerotic disease involves the abdominal aorta. No aneurysm. No enlarged retroperitoneal or mesenteric adenopathy. No enlarged pelvic or inguinal lymph nodes.  Reproductive: Prostate gland is largely obscured by beam hardening artifact from arthroplasty devices.  Other: There is no ascites or focal fluid collections within the abdomen or pelvis.  Musculoskeletal: There is degenerative disc disease noted within the lumbar spine. Bilateral facet hypertrophy and degenerative change noted.  IMPRESSION: 1. Bilateral renal lesions of varying complexity are identified. Most concerning is a 2.2 cm hyperdense lesion arising from the upper pole of the right kidney and a 1.3 cm hyperdense lesion arising from the posterior left kidney. These may represent cyst complicated by hemorrhage or solid enhancing masses. Further evaluation with renal protocol MRI is advised. 2. Improvement in hepatic steatosis. 3. Aortic atherosclerosis.   Electronically Signed   By: Signa Kell M.D.   On: 07/16/2014 10:54   Dg  Chest Port 1 View  07/16/2014   CLINICAL DATA:  Substance abuse.  EXAM: PORTABLE CHEST - 1 VIEW  COMPARISON:  Chest radiograph 06/13/2014  FINDINGS: Monitoring leads overlie the patient. Stable cardiac and mediastinal contours. Low lung volumes. Minimal left basilar atelectasis. No pleural effusion pneumothorax. Right shoulder arthroplasty.  IMPRESSION: No acute cardiopulmonary process.  Left basilar atelectasis.   Electronically Signed   By: Annia Belt M.D.   On: 07/16/2014 03:03    Scheduled Meds: . aspirin EC  325 mg Oral q morning - 10a  . atorvastatin  40 mg Oral q1800  . buPROPion  300 mg Oral Daily  . enoxaparin (LOVENOX) injection  40 mg Subcutaneous Q24H  . folic acid  1 mg Oral Daily   . LORazepam  0-4 mg Intravenous 4 times per day   Followed by  . [START ON 07/18/2014] LORazepam  0-4 mg Intravenous Q12H  . metoprolol tartrate  25 mg Oral QHS  . multivitamin with minerals  1 tablet Oral Daily  . pantoprazole  40 mg Oral Q1200  . QUEtiapine  100 mg Oral QHS  . thiamine  100 mg Oral Daily   Or  . thiamine  100 mg Intravenous Daily   Continuous Infusions: . sodium chloride     Antibiotics Given (last 72 hours)    None      Principal Problem:   Abdominal pain Active Problems:   HTN (hypertension)   Hyponatremia   Chest pain   Alcohol abuse    Time spent:    [facility]  [facility] Pager [phone]. If 7PM-7AM, please contact night-coverage at [email or web address], password [email or web address] 07/17/2014, 10:38 AM  LOS: 2 days

## 2014-07-17 NOTE — Progress Notes (Signed)
Patient's blood pressure 76/59,hr 80,patient asymptomatic,patient says" I 'm just sleepy.". Dr Mitchel Honour notified. Will continue to monitor patient.[name]

## 2014-07-18 DIAGNOSIS — F1020 Alcohol dependence, uncomplicated: Secondary | ICD-10-CM

## 2014-07-18 DIAGNOSIS — F1994 Other psychoactive substance use, unspecified with psychoactive substance-induced mood disorder: Secondary | ICD-10-CM

## 2014-07-18 DIAGNOSIS — E871 Hypo-osmolality and hyponatremia: Secondary | ICD-10-CM

## 2014-07-18 DIAGNOSIS — F1010 Alcohol abuse, uncomplicated: Secondary | ICD-10-CM

## 2014-07-18 DIAGNOSIS — R1084 Generalized abdominal pain: Secondary | ICD-10-CM

## 2014-07-18 DIAGNOSIS — F329 Major depressive disorder, single episode, unspecified: Secondary | ICD-10-CM

## 2014-07-18 DIAGNOSIS — R079 Chest pain, unspecified: Secondary | ICD-10-CM

## 2014-07-18 LAB — BASIC METABOLIC PANEL
Anion gap: 8 (ref 5–15)
BUN: 7 mg/dL (ref 6–20)
CALCIUM: 8.7 mg/dL — AB (ref 8.9–10.3)
CO2: 20 mmol/L — ABNORMAL LOW (ref 22–32)
Chloride: 101 mmol/L (ref 101–111)
Creatinine, Ser: 0.87 mg/dL (ref 0.61–1.24)
GFR calc non Af Amer: >60 mL/min (ref >60)
Glucose, Bld: 92 mg/dL (ref 65–99)
Potassium: 3.9 mmol/L (ref 3.5–5.1)
SODIUM: 129 mmol/L — AB (ref 135–145)

## 2014-07-18 LAB — CBC
HCT: 28.9 % — ABNORMAL LOW (ref 39.0–52.0)
HEMOGLOBIN: 9.4 g/dL — AB (ref 13.0–17.0)
MCH: 26.7 pg (ref 26.0–34.0)
MCHC: 32.5 g/dL (ref 30.0–36.0)
MCV: 82.1 fL (ref 78.0–100.0)
Platelets: 247 10*3/uL (ref 150–400)
RBC: 3.52 MIL/uL — ABNORMAL LOW (ref 4.22–5.81)
RDW: 16.1 % — ABNORMAL HIGH (ref 11.5–15.5)
WBC: 7.3 10*3/uL (ref 4.0–10.5)

## 2014-07-18 MED ORDER — SENNOSIDES-DOCUSATE SODIUM 8.6-50 MG PO TABS
2.0000 | ORAL_TABLET | Freq: Every evening | ORAL | Status: DC | PRN
  Administered 2014-07-18: 2 via ORAL
  Filled 2014-07-18: qty 2

## 2014-07-18 MED ORDER — LORAZEPAM 1 MG PO TABS
1.0000 mg | ORAL_TABLET | Freq: Every day | ORAL | Status: DC
  Administered 2014-07-18: 1 mg via ORAL

## 2014-07-18 MED ORDER — METOPROLOL TARTRATE 25 MG PO TABS
25.0000 mg | ORAL_TABLET | Freq: Every day | ORAL | Status: DC
  Administered 2014-07-18: 25 mg via ORAL
  Filled 2014-07-18 (×2): qty 1

## 2014-07-18 NOTE — Progress Notes (Addendum)
PATIENT DETAILS Name: [name] Age: 64 y.o. Sex: male Date of Birth: 1950-12-01 Admit Date: 07/15/2014 Admitting Physician Eduard Clos, MD ZOX:[id number], MD  Subjective: No abdominal pain or chest pain.  Assessment/Plan: Principal Problem: Abdominal pain: Resolved. CT of the abdomen without any acute findings, lipase normal. Exam is completely benign. Continue with supportive care.  Chest pain: Resolved. Very atypical. Although has a history of CAD-given atypical nature of the pain no further workup felt needed. Cardiology was consulted this hospital stay.  History of CAD: Continue with aspirin, metoprolol and statin  Hyponatremia: Secondary to beer potomania. Sodium significantly improved with supportive care. Follow.  Alcohol abuse: Continue Ativan per CIWA protocol. No significant withdrawal  symptoms at present.  Depression/bipolar disorder: Claims that he is still not over his brother death this past winter. He denies any homicidal/suicidal ideation. Have consulted psychiatry for medication management-especially with ongoing/recurring hyponatremia. For now cautiously continue with both Wellbutrin and Seroquel  HTN (hypertension): Continue with metoprolol. Blood pressure controlled.  Bilateral Renal Cysts: will require MRI kidney as outpatient-discussed with patient-I have asked him to discuss this with his PCP at next visit. Hence will defer further work up to the outpatient setting.  Disposition: Remain inpatient-home in 1-2 days  Antimicrobial agents  See below  Anti-infectives    None      DVT Prophylaxis: Prophylactic Lovenox   Code Status: Full code   Family Communication None  Procedures: nONE  CONSULTS:  psychiatry  Time spent 25 minutes-Greater than 50% of this time was spent in counseling, explanation of diagnosis, planning of further management, and coordination of care.  MEDICATIONS: Scheduled Meds: .  aspirin EC  325 mg Oral q morning - 10a  . atorvastatin  40 mg Oral q1800  . buPROPion  300 mg Oral Daily  . enoxaparin (LOVENOX) injection  40 mg Subcutaneous Q24H  . folic acid  1 mg Oral Daily  . LORazepam  0-4 mg Intravenous Q12H  . metoprolol tartrate  25 mg Oral QHS  . multivitamin with minerals  1 tablet Oral Daily  . pantoprazole  40 mg Oral Q1200  . QUEtiapine  100 mg Oral QHS  . thiamine  100 mg Oral Daily   Continuous Infusions:  PRN Meds:.acetaminophen **OR** acetaminophen, LORazepam **OR** LORazepam, nitroGLYCERIN, ondansetron **OR** ondansetron (ZOFRAN) IV    PHYSICAL EXAM: Vital signs in last 24 hours: Filed Vitals:   07/17/14 1547 07/17/14 2131 07/18/14 0506 07/18/14 1340  BP: 106/65 99/52 99/64  86/51  Pulse: 88 98 68 90  Temp:  98.9 F (37.2 C) 97.3 F (36.3 C) 98.3 F (36.8 C)  TempSrc:  Oral Oral Oral  Resp:  20 18 16   Height:      Weight:      SpO2: 100% 97% 100% 100%    Weight change:  Filed Weights   07/15/14 1809  Weight: 77.111 kg (170 lb)   Body mass index is 24.39 kg/(m^2).   Gen Exam: Awake and alert with clear speech. Neck: Supple, No JVD.   Chest: B/L Clear.   CVS: S1 S2 Regular, no murmurs.  Abdomen: soft, BS +, non tender, non distended.  Extremities: no edema, lower extremities warm to touch. Neurologic: Non Focal.   Skin: No Rash.   Wounds: N/A.   Intake/Output from previous day:  Intake/Output Summary (Last 24 hours) at 07/18/14 1416 Last data filed at 07/18/14 1300  Gross per 24  hour  Intake 320.83 ml  Output   2400 ml  Net -2079.17 ml     LAB RESULTS: CBC  Recent Labs Lab 07/15/14 2041 07/16/14 0530 07/17/14 0700 07/18/14 0511  WBC 6.9 6.9 6.6 7.3  HGB 12.2* 11.3* 10.1* 9.4*  HCT 36.4* 33.9* 30.5* 28.9*  PLT 301 293 231 247  MCV 78.8 80.1 81.1 82.1  MCH 26.4 26.7 26.9 26.7  MCHC 33.5 33.3 33.1 32.5  RDW 15.3 15.4 15.7* 16.1*    Chemistries   Recent Labs Lab 07/16/14 0100 07/16/14 0530  07/16/14 1910 07/17/14 0700 07/18/14 0511  NA 121* 121* 126* 127* 129*  K 3.9 3.8 4.1 4.0 3.9  CL 88* 87* 93* 99* 101  CO2 19* 19* 22 22 20*  GLUCOSE 78 77 131* 105* 92  BUN CREATININE 0.81 0.94 1.02 0.87 0.87  CALCIUM 9.6 9.4 9.0 9.0 8.7*    CBG: No results for input(s): GLUCAP in the last 168 hours.  GFR Estimated Creatinine Clearance: 90.9 mL/min (by C-G formula based on Cr of 0.87).  Coagulation profile No results for input(s): INR, PROTIME in the last 168 hours.  Cardiac Enzymes  Recent Labs Lab 07/16/14 0100 07/16/14 0530 07/16/14 1330  TROPONINI <0.03 <0.03 <0.03    Invalid input(s): POCBNP No results for input(s): DDIMER in the last 72 hours. No results for input(s): HGBA1C in the last 72 hours. No results for input(s): CHOL, HDL, LDLCALC, TRIG, CHOLHDL, LDLDIRECT in the last 72 hours.  Recent Labs  07/16/14 0100  TSH 0.924   No results for input(s): VITAMINB12, FOLATE, FERRITIN, TIBC, IRON, RETICCTPCT in the last 72 hours.  Recent Labs  07/16/14 0100  LIPASE 41    Urine Studies No results for input(s): UHGB, CRYS in the last 72 hours.  Invalid input(s): UACOL, UAPR, USPG, UPH, UTP, UGL, UKET, UBIL, UNIT, UROB, ULEU, UEPI, UWBC, URBC, UBAC, CAST, UCOM, BILUA  MICROBIOLOGY: No results found for this or any previous visit (from the past 240 hour(s)).  RADIOLOGY STUDIES/RESULTS: Ct Abdomen Pelvis W Contrast  07/16/2014   CLINICAL DATA:  Generalized abdominal pain. Nausea vomiting and diarrhea.  EXAM: CT ABDOMEN AND PELVIS WITH CONTRAST  TECHNIQUE: Multidetector CT imaging of the abdomen and pelvis was performed using the standard protocol following bolus administration of intravenous contrast.  CONTRAST:  OMNIPAQUE IOHEXOL 300 MG/ML  SOLN  COMPARISON:  01/11/2013  FINDINGS: Lower chest: The lung bases appear clear. No pleural or pericardial effusions.  Hepatobiliary: Interval improvement in previously noted hepatic steatosis. A few  small foci of low-attenuation noted. These are too small to characterize measuring up to 9 mm, image 12/series 2. The gallbladder appears normal. No biliary dilatation.  Pancreas: Normal appearance of the pancreas.  Spleen: Normal appearance of the spleen.  Adrenals/Urinary Tract: The adrenal glands are on unremarkable. 2.2 cm hyperdense lesion arises from the upper pole the right kidney, image 16/series 2. On previous noncontrast CT this measured 36 Hounsfield units. On today's exam this measures 75 Hounsfield units. There is a small right renal cyst which measures 11 mm. 1.5 cm exophytic lesion arising from the inferior pole of the right kidney noted. This measures 20 Hounsfield units. Previously this lesion measured 11 mm and 39.7 Hounsfield units. There is a hyperdense lesion arising from the posterior left kidney measuring 1.3 cm and 64 Hounsfield units, image 26/series 2. Previously this measured 1.7 cm and 27 Hounsfield units. Two simple appearing cysts are noted within the inferior  pole of the left kidney. Bladder detail diminished secondary to beam hardening artifact from bilateral hip arthroplasty devices.  Stomach/Bowel: The stomach appears normal. The small bowel loops have a normal course and caliber. Appendix negative. Normal appearance of the colon.  Vascular/Lymphatic: Calcified atherosclerotic disease involves the abdominal aorta. No aneurysm. No enlarged retroperitoneal or mesenteric adenopathy. No enlarged pelvic or inguinal lymph nodes.  Reproductive: Prostate gland is largely obscured by beam hardening artifact from arthroplasty devices.  Other: There is no ascites or focal fluid collections within the abdomen or pelvis.  Musculoskeletal: There is degenerative disc disease noted within the lumbar spine. Bilateral facet hypertrophy and degenerative change noted.  IMPRESSION: 1. Bilateral renal lesions of varying complexity are identified. Most concerning is a 2.2 cm hyperdense lesion arising from  the upper pole of the right kidney and a 1.3 cm hyperdense lesion arising from the posterior left kidney. These may represent cyst complicated by hemorrhage or solid enhancing masses. Further evaluation with renal protocol MRI is advised. 2. Improvement in hepatic steatosis. 3. Aortic atherosclerosis.   Electronically Signed   By: Signa Kell M.D.   On: 07/16/2014 10:54   Dg Chest Port 1 View  07/16/2014   CLINICAL DATA:  Substance abuse.  EXAM: PORTABLE CHEST - 1 VIEW  COMPARISON:  Chest radiograph 06/13/2014  FINDINGS: Monitoring leads overlie the patient. Stable cardiac and mediastinal contours. Low lung volumes. Minimal left basilar atelectasis. No pleural effusion pneumothorax. Right shoulder arthroplasty.  IMPRESSION: No acute cardiopulmonary process.  Left basilar atelectasis.   Electronically Signed   By: Annia Belt M.D.   On: 07/16/2014 03:03    Jeoffrey Massed, MD  [facility] Pager:[phone]  If 7PM-7AM, please contact night-coverage [email or web address] Password [email or web address] 07/18/2014, 2:16 PM   LOS: 3 days

## 2014-07-18 NOTE — Consult Note (Signed)
[facility] Face-to-Face Psychiatry Consult   Reason for Consult:  Substance abuse, depression and hyponatremia Referring Physician:  Dr. Sloan Leiter Patient Identification: [name] MRN:  [id number] Principal Diagnosis: Abdominal pain Diagnosis:   Patient Active Problem List   Diagnosis Date Noted  . Abdominal pain [R10.9] 07/15/2014  . Coronary artery disease involving native coronary artery of native heart without angina pectoris [I25.10]   . Essential hypertension [I10]   . LVH (left ventricular hypertrophy) [I51.7]   . Alcohol abuse [F10.10] 06/13/2014  . Hypotension [I95.9] 06/13/2014  . AKI (acute kidney injury) [N17.9] 06/13/2014  . Dehydration [E86.0]   . Acute kidney injury [N17.9]   . Other specified hypotension [I95.89]   . Chronic diastolic heart failure [F74.94] 05/22/2014  . Chest pain [R07.9] 05/22/2014  . Anxiety [F41.9] 09/19/2012  . Bipolar disorder [F31.9] 09/19/2012  . Depression [F32.9] 09/19/2012  . Hyponatremia [E87.1] 09/19/2012  . Anemia [D64.9] 03/22/2012  . Alcohol dependence [F10.20] 03/21/2012    Class: Chronic  . Chest pain at rest [R07.9] 03/20/2012  . Acute blood loss anemia [D62] 02/12/2012  . Pain due to hip joint prosthesis, recalled Depuy ASR [W96.75FF, Z96.649] 02/07/2012  . Polysubstance abuse [F19.10] 09/02/2011  . Broken internal left hip prosthesis [T84.011A] 09/02/2011  . CAD (coronary artery disease) [I25.10] 09/27/2010  . HTN (hypertension) [I10] 09/27/2010  . Hyperlipidemia [E78.5] 09/27/2010    Total Time spent with patient: 1 hour  Subjective:   [name] is a 64 y.o. male patient admitted with hyponatremia and history of substance abuse.  HPI:  [name] is a 64 y.o. male seen face-to-face for psychiatric consultation and evaluation for alcohol dependence, substance-induced mood disorder, hyponatremia. Patient stated that he was admitted to [facility] for chest pain headache and abdominal pain which were eventually  treated successfully. Patient minimizes his physical  complaints at this time Patient reported that he has been living by himself has no boyfriend at this time but has 2 friends who are supportive to him. Patient reportedly found his younger brother was died in [place] during month of October last year but he could not get the information until 2 weeks into December 2015. Patient has been receiving grief counseling at hospice care. Patient reportedly depressed about losing his younger brother. Patient also reportedly relapsed on drinking but denied being dependent on alcohol at this time. 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 Patient denies current symptoms of suicidal/homicidal ideation, intention or plans. He has no evidence of psychosis. Patient presented with abdominal pain generalized crampy and colicky in nature multiple episodes of nausea vomiting but currently denies. Patient stated he has been fine at this time and seeking no additional help for depression or substance abuse.  HPI Elements:  Location:  Depression and alcohol abuse versus dependence. Quality:  Poor to fair. Severity:  Relapse of alcohol and hyponatremia. Timing:  Relapse of substance abuse. Duration:  Few months. Context:  Psychosocial stresses, grief, loss of family member and substance abuse.  Past Medical History:  Past Medical History  Diagnosis Date  . MVP (mitral valve prolapse)   . Anxiety   . Hypertension   . Hyperlipidemia   . Palpitations   . Bipolar disorder   . Depression   . Esophagitis   . Gastritis   . Insomnia   . Hypercholesteremia   . Coronary artery disease     a.  s/p PTCA to PL branch of RCA in 2007;  b.  Last LHC 04/2008:  LM with irregularities, LAD 50%, mid LAD 50%, proximal D1 80-90%, proximal OM1 30-40%, proximal OM2 50%, PLB branch 30-40%, site of prior angioplasty in the RCA widely patent, EF 65%=>Med Rx;  c. Nuclear study a 12/2011: EF 71% and  normal perfusion.    Marland Kitchen History of echocardiogram     Echo 08/16/09: Mild LVH, EF 55-60%, no normal wall motion, grade 1 diastolic dysfunction  . Anginal pain   . Pneumonia 2005  . GERD (gastroesophageal reflux disease)   . Arthritis     "hands; especially in cold weather" (12/11/2013)  . Alcohol related seizure     "when I go off it"    Past Surgical History  Procedure Laterality Date  . Coronary angioplasty  2007    RCA  . Cardiac catheterization  04/2008    EF 65%  . Knee arthroscopy, medial patello femoral ligament repair    . Hip arthroplasty Bilateral     "both replaced twice"   . Total shoulder replacement Right   . Total hip revision  02/07/2012    Procedure: TOTAL HIP REVISION;  Surgeon: Kerin Salen, MD;  Location: [place];  Service: Orthopedics;  Laterality: Left;  [place] Hip Revision Set  . Total knee arthroplasty Right   . Appendectomy  1958  . Tonsillectomy  1958  . Joint replacement     Family History:  Family History  Problem Relation Age of Onset  . Heart disease Neg Hx   . Prostate cancer Father    Social History:  History  Alcohol Use  . 0.0 oz/week    Comment: patient drinks 2-3 times a week     History  Drug Use  . 7.00 per week  . Special: Benzodiazepines    Comment: Percocet, Xanax    History   Social History  . Marital Status: Single    Spouse Name: N/A  . Number of Children: N/A  . Years of Education: N/A   Occupational History  . Retired Optometrist    Social History Main Topics  . Smoking status: Current Every Day Smoker -- 0.50 packs/day for 40 years    Types: Cigarettes  . Smokeless tobacco: Never Used  . Alcohol Use: 0.0 oz/week     Comment: patient drinks 2-3 times a week  . Drug Use: 7.00 per week    Special: Benzodiazepines     Comment: Percocet, Xanax  . Sexual Activity: No   Other Topics Concern  . None   Social History Narrative   Lives alone   Additional Social History:                           Allergies:   Allergies  Allergen Reactions  . Ambien [Zolpidem Tartrate] Other (See Comments)    Sleep Walking  . Codeine Itching    Labs:  Results for orders placed or performed during the hospital encounter of 07/15/14 (from the past 48 hour(s))  Basic metabolic panel     Status: Abnormal   Collection Time: 07/16/14  7:10 PM  Result Value Ref Range   Sodium 126 (L) 135 - 145 mmol/L   Potassium 4.1 3.5 - 5.1 mmol/L   Chloride 93 (L) 101 - 111 mmol/L   CO2 22 22 - 32 mmol/L   Glucose, Bld 131 (H) 65 - 99 mg/dL   BUN 9 6 - 20 mg/dL   Creatinine, Ser 1.02 0.61 - 1.24 mg/dL  Calcium 9.0 8.9 - 10.3 mg/dL   GFR calc non Af Amer >60 >60 mL/min   GFR calc Af Amer >60 >60 mL/min    Comment: (NOTE) The eGFR has been calculated using the CKD EPI equation. This calculation has not been validated in all clinical situations. eGFR's persistently <60 mL/min signify possible Chronic Kidney Disease.    Anion gap 11 5 - 15  CBC     Status: Abnormal   Collection Time: 07/17/14  7:00 AM  Result Value Ref Range   WBC 6.6 4.0 - 10.5 K/uL   RBC 3.76 (L) 4.22 - 5.81 MIL/uL   Hemoglobin 10.1 (L) 13.0 - 17.0 g/dL   HCT 30.5 (L) 39.0 - 52.0 %   MCV 81.1 78.0 - 100.0 fL   MCH 26.9 26.0 - 34.0 pg   MCHC 33.1 30.0 - 36.0 g/dL   RDW 15.7 (H) 11.5 - 15.5 %   Platelets 231 150 - 400 K/uL  Basic metabolic panel     Status: Abnormal   Collection Time: 07/17/14  7:00 AM  Result Value Ref Range   Sodium 127 (L) 135 - 145 mmol/L   Potassium 4.0 3.5 - 5.1 mmol/L   Chloride 99 (L) 101 - 111 mmol/L   CO2 22 22 - 32 mmol/L   Glucose, Bld 105 (H) 65 - 99 mg/dL   BUN 9 6 - 20 mg/dL   Creatinine, Ser 0.87 0.61 - 1.24 mg/dL   Calcium 9.0 8.9 - 10.3 mg/dL   GFR calc non Af Amer >60 >60 mL/min   GFR calc Af Amer >60 >60 mL/min    Comment: (NOTE) The eGFR has been calculated using the CKD EPI equation. This calculation has not been validated in all clinical situations. eGFR's persistently <60 mL/min  signify possible Chronic Kidney Disease.    Anion gap 6 5 - 15  Basic metabolic panel     Status: Abnormal   Collection Time: 07/18/14  5:11 AM  Result Value Ref Range   Sodium 129 (L) 135 - 145 mmol/L   Potassium 3.9 3.5 - 5.1 mmol/L   Chloride 101 101 - 111 mmol/L   CO2 20 (L) 22 - 32 mmol/L   Glucose, Bld 92 65 - 99 mg/dL   BUN 7 6 - 20 mg/dL   Creatinine, Ser 0.87 0.61 - 1.24 mg/dL   Calcium 8.7 (L) 8.9 - 10.3 mg/dL   GFR calc non Af Amer >60 >60 mL/min   GFR calc Af Amer >60 >60 mL/min    Comment: (NOTE) The eGFR has been calculated using the CKD EPI equation. This calculation has not been validated in all clinical situations. eGFR's persistently <60 mL/min signify possible Chronic Kidney Disease.    Anion gap 8 5 - 15  CBC     Status: Abnormal   Collection Time: 07/18/14  5:11 AM  Result Value Ref Range   WBC 7.3 4.0 - 10.5 K/uL   RBC 3.52 (L) 4.22 - 5.81 MIL/uL   Hemoglobin 9.4 (L) 13.0 - 17.0 g/dL   HCT 28.9 (L) 39.0 - 52.0 %   MCV 82.1 78.0 - 100.0 fL   MCH 26.7 26.0 - 34.0 pg   MCHC 32.5 30.0 - 36.0 g/dL   RDW 16.1 (H) 11.5 - 15.5 %   Platelets 247 150 - 400 K/uL    Vitals: Blood pressure 86/51, pulse 90, temperature 98.3 F (36.8 C), temperature source Oral, resp. rate 16, height _0  (1.778 m), weight 77.111 kg (170  lb), SpO2 100 %.  Risk to Self: Is patient at risk for suicide?: No Risk to Others:   Prior Inpatient Therapy:   Prior Outpatient Therapy:    Current Facility-Administered Medications  Medication Dose Route Frequency Provider Last Rate Last Dose  . acetaminophen (TYLENOL) tablet 650 mg  650 mg Oral Q6H PRN Rise Patience, MD   650 mg at 07/17/14 2329   Or  . acetaminophen (TYLENOL) suppository 650 mg  650 mg Rectal Q6H PRN Rise Patience, MD      . aspirin EC tablet 325 mg  325 mg Oral q morning - 10a Rise Patience, MD   325 mg at 07/18/14 1051  . atorvastatin (LIPITOR) tablet 40 mg  40 mg Oral q1800 Rise Patience, MD    40 mg at 07/17/14 1810  . buPROPion (WELLBUTRIN XL) 24 hr tablet 300 mg  300 mg Oral Daily Rise Patience, MD   300 mg at 07/18/14 1051  . enoxaparin (LOVENOX) injection 40 mg  40 mg Subcutaneous Q24H Domenic Polite, MD   40 mg at 07/18/14 1051  . folic acid (FOLVITE) tablet 1 mg  1 mg Oral Daily Rise Patience, MD   1 mg at 07/18/14 1050  . LORazepam (ATIVAN) injection 0-4 mg  0-4 mg Intravenous Q12H Rise Patience, MD   0 mg at 07/18/14 0000  . LORazepam (ATIVAN) tablet 1 mg  1 mg Oral Q6H PRN Rise Patience, MD   1 mg at 07/18/14 1051   Or  . LORazepam (ATIVAN) injection 1 mg  1 mg Intravenous Q6H PRN Rise Patience, MD      . metoprolol tartrate (LOPRESSOR) tablet 25 mg  25 mg Oral QHS Jonetta Osgood, MD      . multivitamin with minerals tablet 1 tablet  1 tablet Oral Daily Rise Patience, MD   1 tablet at 07/18/14 1050  . nitroGLYCERIN (NITROSTAT) SL tablet 0.4 mg  0.4 mg Sublingual Q5 min PRN Rise Patience, MD      . ondansetron [facility]) tablet 4 mg  4 mg Oral Q6H PRN Rise Patience, MD       Or  . ondansetron [facility]) injection 4 mg  4 mg Intravenous Q6H PRN Rise Patience, MD      . pantoprazole (PROTONIX) EC tablet 40 mg  40 mg Oral Q1200 Domenic Polite, MD   40 mg at 07/18/14 1050  . QUEtiapine (SEROQUEL XR) 24 hr tablet 100 mg  100 mg Oral QHS Domenic Polite, MD   100 mg at 07/17/14 2123  . thiamine (VITAMIN B-1) tablet 100 mg  100 mg Oral Daily Rise Patience, MD   100 mg at 07/18/14 1050    Musculoskeletal: Strength & Muscle Tone: within normal limits Gait & Station: unable to stand Patient leans: N/A  Psychiatric Specialty Exam: Physical Exam as per history and physical   ROS abdominal pain, chest pain, alcohol abuse and hyponatremia, nausea and vomiting's. No Fever-chills, No Headache, No changes with Vision or hearing, reports vertigo No problems swallowing food or Liquids, No Cough or Shortness of Breath, Bowel  movements are regular, No Blood in stool or Urine, No dysuria, No new skin rashes or bruises, No new joints pains-aches,  No new weakness, tingling, numbness in any extremity, No recent weight gain or loss, No polyuria, polydypsia or polyphagia,   A full 10 point Review of Systems was done, except as stated above, all other Review  of Systems were negative.   Blood pressure 86/51, pulse 90, temperature 98.3 F (36.8 C), temperature source Oral, resp. rate 16, height _0  (1.778 m), weight 77.111 kg (170 lb), SpO2 100 %.Body mass index is 24.39 kg/(m^2).  General Appearance: Casual  Eye Contact::  Good  Speech:  Clear and Coherent  Volume:  Decreased  Mood:  Depressed  Affect:  Appropriate and Congruent  Thought Process:  Coherent and Goal Directed  Orientation:  Full (Time, Place, and Person)  Thought Content:  WDL  Suicidal Thoughts:  No  Homicidal Thoughts:  No  Memory:  Immediate;   Good Recent;   Fair Remote;   Fair  Judgement:  Fair  Insight:  Good  Psychomotor Activity:  Decreased  Concentration:  Good  Recall:  Good  Fund of Knowledge:Good  Language: Good  Akathisia:  Negative  Handed:  Right  AIMS (if indicated):     Assets:  Communication Skills Desire for Improvement Financial Resources/Insurance Housing Leisure Time Resilience Social Support Talents/Skills Transportation  ADL's:  Intact  Cognition: WNL  Sleep:      Medical Decision Making: Review of Psycho-Social Stressors (1), Review or order clinical lab tests (1), Established Problem, Worsening (2), New Problem, with no additional work-up planned (3), Review of Last Therapy Session (1), Review or order medicine tests (1), Review of Medication Regimen & Side Effects (2) and Review of New Medication or Change in Dosage (2)  Treatment Plan Summary: Patient presented with symptoms of depression, grief and relapse of alcohol with the hyponatremia. Patient denies active suicidal/homicidal ideation,  intention or plan. He has no evidence of psychotic symptoms. Patient has been compliant with his medication and would like to continue follow-up with outpatient medication management. Daily contact with patient to assess and evaluate symptoms and progress in treatment and Medication management  Plan:  Safety concerns: Patient has been contracting for safety and denies suicidal/homicidal ideations. Alcohol withdrawal: Monitor CIWA protocol and Ativan detox treatment Hyponatremia: Patient psychiatric medication Wellbutrin and Seroquel has no contribution to hyponatremia at this time. Please identify other causes of hyponatremia like nausea, vomiting and alcohol withdrawal symptoms and treat appropriately Depression: Continue Wellbutrin XL 300 mg daily Insomnia: Continue Seroquel 100 mg at bedtime Alcohol abuse versus dependence: Counseling and encouraged to continue stay sober  Patient does not meet criteria for psychiatric inpatient admission. Supportive therapy provided about ongoing stressors.  Disposition: Patient will be referred to the outpatient medication management and medically stable.  Johniece Hornbaker,JANARDHAHA R. 07/18/2014 2:11 PM

## 2014-07-19 DIAGNOSIS — R109 Unspecified abdominal pain: Secondary | ICD-10-CM

## 2014-07-19 LAB — BASIC METABOLIC PANEL
Anion gap: 7 (ref 5–15)
BUN: 6 mg/dL (ref 6–20)
CALCIUM: 8.8 mg/dL — AB (ref 8.9–10.3)
CO2: 22 mmol/L (ref 22–32)
CREATININE: 0.79 mg/dL (ref 0.61–1.24)
Chloride: 101 mmol/L (ref 101–111)
GFR calc Af Amer: >60 mL/min (ref >60)
GFR calc non Af Amer: >60 mL/min (ref >60)
GLUCOSE: 98 mg/dL (ref 65–99)
Potassium: 3.6 mmol/L (ref 3.5–5.1)
SODIUM: 130 mmol/L — AB (ref 135–145)

## 2014-07-19 MED ORDER — LORAZEPAM 1 MG PO TABS: 1.0000 mg | ORAL_TABLET | Freq: Every day | ORAL | Status: DC

## 2014-07-19 MED ORDER — QUETIAPINE FUMARATE ER 50 MG PO TB24: 100.0000 mg | ORAL_TABLET | Freq: Every day | ORAL | Status: DC

## 2014-07-19 MED ORDER — PANTOPRAZOLE SODIUM 40 MG PO TBEC: 40.0000 mg | DELAYED_RELEASE_TABLET | Freq: Every day | ORAL | Status: DC

## 2014-07-19 NOTE — Evaluation (Signed)
Physical Therapy Screen Patient Details Name: [name] MRN: [id number] DOB: 1950-12-18 Today's Date: 07/19/2014   History of Present Illness  64 yo male admitted with abdominal pain, ches tpain. Hx of alcoholism, bipolar d/o, Sz, anxiety, CAD. Pt lives alone  Clinical Impression  Screen assessment-pt mobilizing at Mod Ind level. Pt denied any difficulties with mobility. Walked ~200 feet in hallway with no LOB. Pt feels he is at his baseline. Very quick assessment so did not formally charge for visit.     Follow Up Recommendations No PT follow up    Equipment Recommendations  None recommended by PT    Recommendations for Other Services       Precautions / Restrictions Precautions Precautions: None Restrictions Weight Bearing Restrictions: No      Mobility  Bed Mobility Overal bed mobility: Independent                Transfers Overall transfer level: Independent                  Ambulation/Gait Ambulation/Gait assistance: Modified independent (Device/Increase time) Ambulation Distance (Feet): 200 Feet Assistive device: None       General Gait Details: good gait speed. no lob  Stairs            Wheelchair Mobility    Modified Rankin (Stroke Patients Only)       Balance Overall balance assessment: No apparent balance deficits (not formally assessed)                                           Pertinent Vitals/Pain Pain Assessment: No/denies pain    Home Living Family/patient expects to be discharged to:: Private residence Living Arrangements: Alone   Type of Home: Apartment Home Access: Level entry     Home Layout: One level Home Equipment: None      Prior Function Level of Independence: Independent         Comments: Pt reports he goes to gym at least 3 days/wk.  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, radio        Extremity/Trunk Assessment   Upper Extremity Assessment:  Overall WFL for tasks assessed           Lower Extremity Assessment: Overall WFL for tasks assessed      Cervical / Trunk Assessment: Normal  Communication   Communication: No difficulties  Cognition Arousal/Alertness: Awake/alert Behavior During Therapy: WFL for tasks assessed/performed Overall Cognitive Status: Within Functional Limits for tasks assessed                      General Comments      Exercises        Assessment/Plan    PT Assessment Patent does not need any further PT services  PT Diagnosis     PT Problem List    PT Treatment Interventions     PT Goals (Current goals can be found in the Care Plan section) Acute Rehab PT Goals Patient Stated Goal: home PT Goal Formulation: All assessment and education complete, DC therapy    Frequency     Barriers to discharge        Co-evaluation               End of Session   Activity Tolerance: Patient tolerated  treatment well Patient left: in bed;with call bell/phone within reach           Time: 0910-0917 PT Time Calculation (min) (ACUTE ONLY): 7 min   Charges:         PT G Codes:        Rebeca Alert, MPT Pager: [phone]

## 2014-07-19 NOTE — Discharge Instructions (Signed)
You have bilateral kidney cysts-please follow up with your Primary Care MD regarding this. You will need a MRI of the kidneys to be done. In some cases, this can turn out to be cancerous.

## 2014-07-19 NOTE — Discharge Summary (Addendum)
PATIENT DETAILS Name: [name] Age: 64 y.o. Sex: male Date of Birth: May 27, 1950 MRN: [id number]. Admitting Physician: Eduard Clos, MD YBW:[email or web address],JGOTL, MD  Admit Date: 07/15/2014 Discharge date: 07/19/2014  Recommendations for Outpatient Follow-up:  1. Patient needs MRI of bilateral kidneys-has renal cysts-needs a MRI to rule out complex cysts/malignancy. Deferred to the outpatient setting 2. 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 Needs ongoing counseling for alcohol use.   PRIMARY DISCHARGE DIAGNOSIS:  Principal Problem:   Abdominal pain Active Problems:   HTN (hypertension)   Hyponatremia   Chest pain   Alcohol abuse      PAST MEDICAL HISTORY: Past Medical History  Diagnosis Date  . MVP (mitral valve prolapse)   . Anxiety   . Hypertension   . Hyperlipidemia   . Palpitations   . Bipolar disorder   . Depression   . Esophagitis   . Gastritis   . Insomnia   . Hypercholesteremia   . Coronary artery disease     a.  s/p PTCA to PL branch of RCA in 2007;  b.  Last LHC 04/2008:  LM with irregularities, LAD 50%, mid LAD 50%, proximal D1 80-90%, proximal OM1 30-40%, proximal OM2 50%, PLB branch 30-40%, site of prior angioplasty in the RCA widely patent, EF 65%=>Med Rx;  c. Nuclear study a 12/2011: EF 71% and normal perfusion.    Marland Kitchen History of echocardiogram     Echo 08/16/09: Mild LVH, EF 55-60%, no normal wall motion, grade 1 diastolic dysfunction  . Anginal pain   . Pneumonia 2005  . GERD (gastroesophageal reflux disease)   . Arthritis     "hands; especially in cold weather" (12/11/2013)  . Alcohol related seizure     "when I go off it"    DISCHARGE MEDICATIONS: Current Discharge Medication List    START taking these medications   Details  LORazepam (ATIVAN) 1 MG tablet Take 1 tablet (1 mg total) by mouth at bedtime. Qty: 15 tablet, Refills: 0    pantoprazole (PROTONIX) 40 MG tablet Take 1  tablet (40 mg total) by mouth daily at 12 noon. Qty: 30 tablet, Refills: 0      CONTINUE these medications which have CHANGED   Details  QUEtiapine (SEROQUEL XR) 50 MG TB24 24 hr tablet Take 2 tablets (100 mg total) by mouth at bedtime. Qty: 60 each, Refills: 0      CONTINUE these medications which have NOT CHANGED   Details  aspirin EC 325 MG tablet Take 1 tablet (325 mg total) by mouth every morning. For blood thinner for heart health Qty: 30 tablet, Refills: 0    atorvastatin (LIPITOR) 40 MG tablet Take 1 tablet (40 mg total) by mouth daily. For cholesterol Qty: 30 tablet, Refills: 0    buPROPion (WELLBUTRIN XL) 300 MG 24 hr tablet Take 1 tablet (300 mg total) by mouth daily. For depression Qty: 30 tablet, Refills: 0    metoprolol tartrate (LOPRESSOR) 25 MG tablet Take 25 mg by mouth at bedtime.    Multiple Vitamin (MULTIVITAMIN WITH MINERALS) TABS tablet Take 1 tablet by mouth daily. Qty: 30 tablet, Refills: 0    nitroGLYCERIN (NITROSTAT) 0.4 MG SL tablet Place 1 tablet (0.4 mg total) under the tongue every 5 (five) minutes as needed for chest pain (CP or SOB). Qty: 15 tablet, Refills: 0    folic acid (FOLVITE) 1 MG tablet Take 1 tablet (1 mg total) by mouth daily. Qty: 30  tablet, Refills: 0    thiamine 100 MG tablet Take 1 tablet (100 mg total) by mouth daily. Qty: 30 tablet, Refills: 0      STOP taking these medications     acetaminophen (TYLENOL) 325 MG tablet      ALPRAZolam (XANAX XR) 2 MG 24 hr tablet         ALLERGIES:   Allergies  Allergen Reactions  . Ambien [Zolpidem Tartrate] Other (See Comments)    Sleep Walking  . Codeine Itching    BRIEF HPI:  See H&P, Labs, Consult and Test reports for all details in brief, patient is a 64 year old male with a history of evaluation abuse, CAD status post PCI, anxiety with depression presented with abdominal pain, vomiting and also chest pain. Patient was admitted for further evaluation and treatment.    CONSULTATIONS:   psychiatry  PERTINENT RADIOLOGIC STUDIES: Ct Abdomen Pelvis W Contrast  07/16/2014   CLINICAL DATA:  Generalized abdominal pain. Nausea vomiting and diarrhea.  EXAM: CT ABDOMEN AND PELVIS WITH CONTRAST  TECHNIQUE: Multidetector CT imaging of the abdomen and pelvis was performed using the standard protocol following bolus administration of intravenous contrast.  CONTRAST:  OMNIPAQUE IOHEXOL 300 MG/ML  SOLN  COMPARISON:  01/11/2013  FINDINGS: Lower chest: The lung bases appear clear. No pleural or pericardial effusions.  Hepatobiliary: Interval improvement in previously noted hepatic steatosis. A few small foci of low-attenuation noted. These are too small to characterize measuring up to 9 mm, image 12/series 2. The gallbladder appears normal. No biliary dilatation.  Pancreas: Normal appearance of the pancreas.  Spleen: Normal appearance of the spleen.  Adrenals/Urinary Tract: The adrenal glands are on unremarkable. 2.2 cm hyperdense lesion arises from the upper pole the right kidney, image 16/series 2. On previous noncontrast CT this measured 36 Hounsfield units. On today's exam this measures 75 Hounsfield units. There is a small right renal cyst which measures 11 mm. 1.5 cm exophytic lesion arising from the inferior pole of the right kidney noted. This measures 20 Hounsfield units. Previously this lesion measured 11 mm and 39.7 Hounsfield units. There is a hyperdense lesion arising from the posterior left kidney measuring 1.3 cm and 64 Hounsfield units, image 26/series 2. Previously this measured 1.7 cm and 27 Hounsfield units. Two simple appearing cysts are noted within the inferior pole of the left kidney. Bladder detail diminished secondary to beam hardening artifact from bilateral hip arthroplasty devices.  Stomach/Bowel: The stomach appears normal. The small bowel loops have a normal course and caliber. Appendix negative. Normal appearance of the colon.  Vascular/Lymphatic:  Calcified atherosclerotic disease involves the abdominal aorta. No aneurysm. No enlarged retroperitoneal or mesenteric adenopathy. No enlarged pelvic or inguinal lymph nodes.  Reproductive: Prostate gland is largely obscured by beam hardening artifact from arthroplasty devices.  Other: There is no ascites or focal fluid collections within the abdomen or pelvis.  Musculoskeletal: There is degenerative disc disease noted within the lumbar spine. Bilateral facet hypertrophy and degenerative change noted.  IMPRESSION: 1. Bilateral renal lesions of varying complexity are identified. Most concerning is a 2.2 cm hyperdense lesion arising from the upper pole of the right kidney and a 1.3 cm hyperdense lesion arising from the posterior left kidney. These may represent cyst complicated by hemorrhage or solid enhancing masses. Further evaluation with renal protocol MRI is advised. 2. Improvement in hepatic steatosis. 3. Aortic atherosclerosis.   Electronically Signed   By: Signa Kell M.D.   On: 07/16/2014 10:54   Dg Chest  Port 1 View  07/16/2014   CLINICAL DATA:  Substance abuse.  EXAM: PORTABLE CHEST - 1 VIEW  COMPARISON:  Chest radiograph 06/13/2014  FINDINGS: Monitoring leads overlie the patient. Stable cardiac and mediastinal contours. Low lung volumes. Minimal left basilar atelectasis. No pleural effusion pneumothorax. Right shoulder arthroplasty.  IMPRESSION: No acute cardiopulmonary process.  Left basilar atelectasis.   Electronically Signed   By: Annia Belt M.D.   On: 07/16/2014 03:03     PERTINENT LAB RESULTS: CBC:  Recent Labs  07/17/14 0700 07/18/14 0511  WBC 6.6 7.3  HGB 10.1* 9.4*  HCT 30.5* 28.9*  PLT 231 247   CMET CMP     Component Value Date/Time   NA 130* 07/19/2014 0519   K 3.6 07/19/2014 0519   CL 101 07/19/2014 0519   CO2 22 07/19/2014 0519   GLUCOSE 98 07/19/2014 0519   BUN 6 07/19/2014 0519   CREATININE 0.79 07/19/2014 0519   CALCIUM 8.8* 07/19/2014 0519   PROT 8.2*  07/15/2014 2041   ALBUMIN 5.1* 07/15/2014 2041   AST 25 07/15/2014 2041   ALT 18 07/15/2014 2041   ALKPHOS 84 07/15/2014 2041   BILITOT 0.6 07/15/2014 2041   GFRNONAA >60 07/19/2014 0519   GFRAA >60 07/19/2014 0519    GFR Estimated Creatinine Clearance: 98.9 mL/min (by C-G formula based on Cr of 0.79). No results for input(s): LIPASE, AMYLASE in the last 72 hours.  Recent Labs  07/16/14 1330  TROPONINI <0.03   Invalid input(s): POCBNP No results for input(s): DDIMER in the last 72 hours. No results for input(s): HGBA1C in the last 72 hours. No results for input(s): CHOL, HDL, LDLCALC, TRIG, CHOLHDL, LDLDIRECT in the last 72 hours. No results for input(s): TSH, T4TOTAL, T3FREE, THYROIDAB in the last 72 hours.  Invalid input(s): FREET3 No results for input(s): VITAMINB12, FOLATE, FERRITIN, TIBC, IRON, RETICCTPCT in the last 72 hours. Coags: No results for input(s): INR in the last 72 hours.  Invalid input(s): PT Microbiology: No results found for this or any previous visit (from the past 240 hour(s)).   BRIEF HOSPITAL COURSE:  Abdominal pain: Resolved. CT of the abdomen without any acute findings, lipase normal. Probably secondary to GERD. Exam is completely benign. Continue with PPI on discharge  Chest pain: Resolved. Very atypical. Although has a history of CAD-given atypical nature of the pain no further workup felt needed. Cardiology was consulted this hospital stay.  History of CAD: Continue with aspirin, metoprolol and statin  Hyponatremia: Secondary to beer potomania. Sodium significantly improved with supportive care. Follow in the outpatient setting.  Alcohol abuse: Managed with Ativan per CIWA protocol. No significant withdrawal symptoms at present. Patient awake and alert, ambulating in the hallway independently.  Depression/bipolar disorder: Claims that he is still not over his brother death this past winter. He denies any homicidal/suicidal ideation.  Psychiatry was consulted during this hospital stay-accommodations were to continue with Wellbutrin and Seroquel. Patient was on Xanax daily at bedtime-we will convert this to lorazepam. Further optimization to be done in the outpatient setting.  HTN (hypertension): Continue with metoprolol. Blood pressure controlled.  Bilateral Renal Cysts: will require MRI kidney as outpatient-discussed with patient-I have asked him to discuss this with his PCP at next visit. Hence will defer further work up to the outpatient setting. Patient has claimed understanding-he is aware that there is a chance that this could be malignant.  TODAY-DAY OF DISCHARGE:  Subjective:   Jodene Nam today has no headache,no chest abdominal pain,no  new weakness tingling or numbness, feels much better wants to go home today.   Objective:   Blood pressure 111/71, pulse 68, temperature 97.9 F (36.6 C), temperature source Oral, resp. rate 16, height  (1.778 m), weight 77.111 kg (170 lb), SpO2 99 %.  Intake/Output Summary (Last 24 hours) at 07/19/14 1007 Last data filed at 07/19/14 0927  Gross per 24 hour  Intake    960 ml  Output   1600 ml  Net   -640 ml   Filed Weights   07/15/14 1809  Weight: 77.111 kg (170 lb)    Exam Awake Alert, Oriented *3, No new F.N deficits, Normal affect [place].AT,PERRAL Supple Neck,No JVD, No cervical lymphadenopathy appriciated.  Symmetrical Chest wall movement, Good air movement bilaterally, CTAB RRR,No Gallops,Rubs or new Murmurs, No Parasternal Heave +ve B.Sounds, Abd Soft, Non tender, No organomegaly appriciated, No rebound -guarding or rigidity. No Cyanosis, Clubbing or edema, No new Rash or bruise  DISCHARGE CONDITION: Stable  DISPOSITION: Home  DISCHARGE INSTRUCTIONS:    Activity:  As tolerated   Diet recommendation: Heart Healthy diet  Discharge Instructions    Call MD for:  persistant dizziness or light-headedness    Complete by:  As directed      Diet - low  sodium heart healthy    Complete by:  As directed      Discharge instructions    Complete by:  As directed   You have bilateral kidney cysts-please follow up with your Primary Care MD regarding this. You will need a MRI of the kidneys to be done. In some cases, this can turn out to be cancerous.     Increase activity slowly    Complete by:  As directed           Follow-up Information    Follow up with Thayer Headings, MD. Schedule an appointment as soon as possible for a visit in 1 week.   Specialty:  Internal Medicine   Why:  For hospital discharge follow-up. Please ask your primary M.D. to make sure you get a MRI of the kidneys.   Contact information:   [address] [place] [address] [phone]      Total Time spent on discharge equals  45 minutes  Signed: Jeoffrey Massed 07/19/2014 10:07 AM

## 2014-09-08 ENCOUNTER — Other Ambulatory Visit: Payer: Self-pay | Admitting: Internal Medicine

## 2014-09-08 DIAGNOSIS — N289 Disorder of kidney and ureter, unspecified: Secondary | ICD-10-CM

## 2014-09-16 ENCOUNTER — Inpatient Hospital Stay: Admission: RE | Admit: 2014-09-16 | Payer: Self-pay | Source: Ambulatory Visit

## 2014-09-17 ENCOUNTER — Ambulatory Visit
Admission: RE | Admit: 2014-09-17 | Discharge: 2014-09-17 | Disposition: A | Discharge status: Home / Self Care | Payer: Medicare Other | Source: Ambulatory Visit | Attending: Internal Medicine | Admitting: Internal Medicine

## 2014-09-17 DIAGNOSIS — N289 Disorder of kidney and ureter, unspecified: Secondary | ICD-10-CM

## 2014-09-17 MED ORDER — GADOBENATE DIMEGLUMINE 529 MG/ML IV SOLN
15.0000 mL | Freq: Once | INTRAVENOUS | Status: AC | PRN
  Administered 2014-09-17: 15 mL via INTRAVENOUS

## 2014-11-25 ENCOUNTER — Inpatient Hospital Stay (HOSPITAL_COMMUNITY)
Admission: EM | Admit: 2014-11-25 | Discharge: 2014-11-28 | DRG: 641 | Disposition: A | Discharge status: Home / Self Care | Payer: Medicare Other | Attending: Internal Medicine | Admitting: Internal Medicine

## 2014-11-25 ENCOUNTER — Encounter (HOSPITAL_COMMUNITY): Payer: Self-pay | Admitting: Emergency Medicine

## 2014-11-25 ENCOUNTER — Emergency Department (HOSPITAL_COMMUNITY): Payer: Medicare Other

## 2014-11-25 DIAGNOSIS — Z96611 Presence of right artificial shoulder joint: Secondary | ICD-10-CM | POA: Diagnosis present

## 2014-11-25 DIAGNOSIS — E860 Dehydration: Secondary | ICD-10-CM | POA: Diagnosis present

## 2014-11-25 DIAGNOSIS — R197 Diarrhea, unspecified: Secondary | ICD-10-CM

## 2014-11-25 DIAGNOSIS — I110 Hypertensive heart disease with heart failure: Secondary | ICD-10-CM | POA: Diagnosis present

## 2014-11-25 DIAGNOSIS — I10 Essential (primary) hypertension: Secondary | ICD-10-CM | POA: Diagnosis not present

## 2014-11-25 DIAGNOSIS — E876 Hypokalemia: Secondary | ICD-10-CM | POA: Diagnosis present

## 2014-11-25 DIAGNOSIS — D649 Anemia, unspecified: Secondary | ICD-10-CM | POA: Diagnosis present

## 2014-11-25 DIAGNOSIS — F419 Anxiety disorder, unspecified: Secondary | ICD-10-CM | POA: Diagnosis present

## 2014-11-25 DIAGNOSIS — G4700 Insomnia, unspecified: Secondary | ICD-10-CM | POA: Diagnosis present

## 2014-11-25 DIAGNOSIS — Z9861 Coronary angioplasty status: Secondary | ICD-10-CM | POA: Diagnosis not present

## 2014-11-25 DIAGNOSIS — F10239 Alcohol dependence with withdrawal, unspecified: Secondary | ICD-10-CM | POA: Diagnosis present

## 2014-11-25 DIAGNOSIS — K529 Noninfective gastroenteritis and colitis, unspecified: Secondary | ICD-10-CM | POA: Diagnosis present

## 2014-11-25 DIAGNOSIS — F1010 Alcohol abuse, uncomplicated: Secondary | ICD-10-CM | POA: Diagnosis not present

## 2014-11-25 DIAGNOSIS — R0789 Other chest pain: Secondary | ICD-10-CM | POA: Diagnosis not present

## 2014-11-25 DIAGNOSIS — R079 Chest pain, unspecified: Secondary | ICD-10-CM | POA: Diagnosis not present

## 2014-11-25 DIAGNOSIS — E785 Hyperlipidemia, unspecified: Secondary | ICD-10-CM | POA: Diagnosis present

## 2014-11-25 DIAGNOSIS — K219 Gastro-esophageal reflux disease without esophagitis: Secondary | ICD-10-CM | POA: Diagnosis present

## 2014-11-25 DIAGNOSIS — E871 Hypo-osmolality and hyponatremia: Secondary | ICD-10-CM | POA: Diagnosis present

## 2014-11-25 DIAGNOSIS — R112 Nausea with vomiting, unspecified: Secondary | ICD-10-CM | POA: Diagnosis present

## 2014-11-25 DIAGNOSIS — Y904 Blood alcohol level of 80-99 mg/100 ml: Secondary | ICD-10-CM | POA: Diagnosis present

## 2014-11-25 DIAGNOSIS — F10230 Alcohol dependence with withdrawal, uncomplicated: Secondary | ICD-10-CM | POA: Diagnosis not present

## 2014-11-25 DIAGNOSIS — Z7982 Long term (current) use of aspirin: Secondary | ICD-10-CM | POA: Diagnosis not present

## 2014-11-25 DIAGNOSIS — I2510 Atherosclerotic heart disease of native coronary artery without angina pectoris: Secondary | ICD-10-CM | POA: Diagnosis present

## 2014-11-25 DIAGNOSIS — F1910 Other psychoactive substance abuse, uncomplicated: Secondary | ICD-10-CM | POA: Diagnosis present

## 2014-11-25 DIAGNOSIS — M1990 Unspecified osteoarthritis, unspecified site: Secondary | ICD-10-CM | POA: Diagnosis present

## 2014-11-25 DIAGNOSIS — F319 Bipolar disorder, unspecified: Secondary | ICD-10-CM | POA: Diagnosis present

## 2014-11-25 DIAGNOSIS — F314 Bipolar disorder, current episode depressed, severe, without psychotic features: Secondary | ICD-10-CM | POA: Diagnosis not present

## 2014-11-25 DIAGNOSIS — I5032 Chronic diastolic (congestive) heart failure: Secondary | ICD-10-CM | POA: Diagnosis present

## 2014-11-25 DIAGNOSIS — F17210 Nicotine dependence, cigarettes, uncomplicated: Secondary | ICD-10-CM | POA: Diagnosis present

## 2014-11-25 DIAGNOSIS — E878 Other disorders of electrolyte and fluid balance, not elsewhere classified: Secondary | ICD-10-CM | POA: Diagnosis present

## 2014-11-25 DIAGNOSIS — I341 Nonrheumatic mitral (valve) prolapse: Secondary | ICD-10-CM | POA: Diagnosis present

## 2014-11-25 DIAGNOSIS — Z96651 Presence of right artificial knee joint: Secondary | ICD-10-CM | POA: Diagnosis present

## 2014-11-25 DIAGNOSIS — R569 Unspecified convulsions: Secondary | ICD-10-CM | POA: Diagnosis present

## 2014-11-25 DIAGNOSIS — Z79899 Other long term (current) drug therapy: Secondary | ICD-10-CM | POA: Diagnosis not present

## 2014-11-25 DIAGNOSIS — Z96643 Presence of artificial hip joint, bilateral: Secondary | ICD-10-CM | POA: Diagnosis present

## 2014-11-25 LAB — URINALYSIS, ROUTINE W REFLEX MICROSCOPIC
Bilirubin Urine: NEGATIVE
Glucose, UA: NEGATIVE mg/dL
Hgb urine dipstick: NEGATIVE
Ketones, ur: 15 mg/dL — AB
LEUKOCYTES UA: NEGATIVE
NITRITE: NEGATIVE
Protein, ur: NEGATIVE mg/dL
SPECIFIC GRAVITY, URINE: 1.012 (ref 1.005–1.030)
UROBILINOGEN UA: 0.2 mg/dL (ref 0.0–1.0)
pH: 6 (ref 5.0–8.0)

## 2014-11-25 LAB — RAPID URINE DRUG SCREEN, HOSP PERFORMED
Amphetamines: NOT DETECTED
BENZODIAZEPINES: POSITIVE — AB
Barbiturates: NOT DETECTED
COCAINE: NOT DETECTED
OPIATES: NOT DETECTED
Tetrahydrocannabinol: NOT DETECTED

## 2014-11-25 LAB — BASIC METABOLIC PANEL
ANION GAP: 17 — AB (ref 5–15)
Anion gap: 16 — ABNORMAL HIGH (ref 5–15)
Anion gap: 15 (ref 5–15)
BUN: 6 mg/dL (ref 6–20)
CALCIUM: 10.1 mg/dL (ref 8.9–10.3)
CHLORIDE: 84 mmol/L — AB (ref 101–111)
CO2: 21 mmol/L — ABNORMAL LOW (ref 22–32)
CO2: 21 mmol/L — AB (ref 22–32)
CO2: 19 mmol/L — AB (ref 22–32)
CREATININE: 0.94 mg/dL (ref 0.61–1.24)
CREATININE: 0.82 mg/dL (ref 0.61–1.24)
Calcium: 9.2 mg/dL (ref 8.9–10.3)
Calcium: 9 mg/dL (ref 8.9–10.3)
Chloride: 79 mmol/L — ABNORMAL LOW (ref 101–111)
Chloride: 83 mmol/L — ABNORMAL LOW (ref 101–111)
Creatinine, Ser: 0.96 mg/dL (ref 0.61–1.24)
GFR calc Af Amer: >60 mL/min (ref >60)
GFR calc Af Amer: >60 mL/min (ref >60)
GFR calc non Af Amer: >60 mL/min (ref >60)
GLUCOSE: 88 mg/dL (ref 65–99)
Glucose, Bld: 80 mg/dL (ref 65–99)
Glucose, Bld: 75 mg/dL (ref 65–99)
POTASSIUM: 4.7 mmol/L (ref 3.5–5.1)
Potassium: 4 mmol/L (ref 3.5–5.1)
Potassium: 4.1 mmol/L (ref 3.5–5.1)
SODIUM: 116 mmol/L — AB (ref 135–145)
Sodium: 120 mmol/L — ABNORMAL LOW (ref 135–145)
Sodium: 119 mmol/L — CL (ref 135–145)

## 2014-11-25 LAB — SODIUM, URINE, RANDOM: SODIUM UR: 110 mmol/L

## 2014-11-25 LAB — CBC
HCT: 36.7 % — ABNORMAL LOW (ref 39.0–52.0)
Hemoglobin: 11.9 g/dL — ABNORMAL LOW (ref 13.0–17.0)
MCH: 25.6 pg — ABNORMAL LOW (ref 26.0–34.0)
MCHC: 32.4 g/dL (ref 30.0–36.0)
MCV: 79.1 fL (ref 78.0–100.0)
PLATELETS: 388 10*3/uL (ref 150–400)
RBC: 4.64 MIL/uL (ref 4.22–5.81)
RDW: 17.3 % — AB (ref 11.5–15.5)
WBC: 8.6 10*3/uL (ref 4.0–10.5)

## 2014-11-25 LAB — OSMOLALITY: OSMOLALITY: 259 mosm/kg — AB (ref 275–300)

## 2014-11-25 LAB — ETHANOL: ALCOHOL ETHYL (B): 87 mg/dL — AB (ref <5)

## 2014-11-25 LAB — TROPONIN I
Troponin I: <0.03 ng/mL (ref <0.031)
Troponin I: <0.03 ng/mL (ref <0.031)

## 2014-11-25 LAB — OSMOLALITY, URINE: OSMOLALITY UR: 432 mosm/kg (ref 390–1090)

## 2014-11-25 MED ORDER — SODIUM CHLORIDE 0.9 % IV BOLUS (SEPSIS): 1000.0000 mL | Freq: Once | INTRAVENOUS | Status: DC

## 2014-11-25 MED ORDER — ASPIRIN EC 325 MG PO TBEC
325.0000 mg | DELAYED_RELEASE_TABLET | Freq: Every morning | ORAL | Status: DC
  Administered 2014-11-25 – 2014-11-28 (×4): 325 mg via ORAL
  Filled 2014-11-25 (×4): qty 1

## 2014-11-25 MED ORDER — ACETAMINOPHEN 650 MG RE SUPP: 650.0000 mg | Freq: Four times a day (QID) | RECTAL | Status: DC | PRN

## 2014-11-25 MED ORDER — SODIUM CHLORIDE 0.9 % IV SOLN
INTRAVENOUS | Status: DC
  Administered 2014-11-25: 15:00:00 via INTRAVENOUS

## 2014-11-25 MED ORDER — METOCLOPRAMIDE HCL 5 MG/ML IJ SOLN: 10.0000 mg | Freq: Four times a day (QID) | INTRAMUSCULAR | Status: DC | PRN

## 2014-11-25 MED ORDER — SODIUM CHLORIDE 0.9 % IJ SOLN
3.0000 mL | Freq: Two times a day (BID) | INTRAMUSCULAR | Status: DC
  Administered 2014-11-25 (×2): 3 mL via INTRAVENOUS

## 2014-11-25 MED ORDER — BUPROPION HCL ER (XL) 300 MG PO TB24
300.0000 mg | ORAL_TABLET | Freq: Every day | ORAL | Status: DC
  Administered 2014-11-27 – 2014-11-28 (×2): 300 mg via ORAL
  Filled 2014-11-25 (×4): qty 1

## 2014-11-25 MED ORDER — ADULT MULTIVITAMIN W/MINERALS CH
1.0000 | ORAL_TABLET | Freq: Every day | ORAL | Status: DC
  Administered 2014-11-25 – 2014-11-28 (×4): 1 via ORAL
  Filled 2014-11-25 (×4): qty 1

## 2014-11-25 MED ORDER — FOLIC ACID 1 MG PO TABS
1.0000 mg | ORAL_TABLET | Freq: Every day | ORAL | Status: DC
  Administered 2014-11-25 – 2014-11-28 (×4): 1 mg via ORAL
  Filled 2014-11-25 (×4): qty 1

## 2014-11-25 MED ORDER — PROMETHAZINE HCL 25 MG/ML IJ SOLN
12.5000 mg | Freq: Three times a day (TID) | INTRAMUSCULAR | Status: DC | PRN
  Filled 2014-11-25: qty 1

## 2014-11-25 MED ORDER — QUETIAPINE FUMARATE ER 50 MG PO TB24
100.0000 mg | ORAL_TABLET | Freq: Every day | ORAL | Status: DC
  Administered 2014-11-25 – 2014-11-27 (×3): 100 mg via ORAL
  Filled 2014-11-25 (×5): qty 2

## 2014-11-25 MED ORDER — OXYCODONE HCL 5 MG PO TABS
5.0000 mg | ORAL_TABLET | Freq: Four times a day (QID) | ORAL | Status: DC | PRN
  Administered 2014-11-25 – 2014-11-26 (×2): 5 mg via ORAL
  Filled 2014-11-25 (×2): qty 1

## 2014-11-25 MED ORDER — LORAZEPAM 1 MG PO TABS
1.0000 mg | ORAL_TABLET | Freq: Four times a day (QID) | ORAL | Status: AC | PRN
  Administered 2014-11-26 – 2014-11-28 (×8): 1 mg via ORAL
  Filled 2014-11-25 (×8): qty 1

## 2014-11-25 MED ORDER — PANTOPRAZOLE SODIUM 40 MG PO TBEC
40.0000 mg | DELAYED_RELEASE_TABLET | Freq: Every day | ORAL | Status: DC
  Administered 2014-11-25 – 2014-11-26 (×2): 40 mg via ORAL
  Filled 2014-11-25 (×2): qty 1

## 2014-11-25 MED ORDER — BOOST / RESOURCE BREEZE PO LIQD
1.0000 | Freq: Three times a day (TID) | ORAL | Status: DC
  Administered 2014-11-25 – 2014-11-27 (×8): 1 via ORAL
  Administered 2014-11-28: 10:00:00 via ORAL

## 2014-11-25 MED ORDER — ENOXAPARIN SODIUM 40 MG/0.4ML ~~LOC~~ SOLN
40.0000 mg | SUBCUTANEOUS | Status: DC
  Administered 2014-11-25 – 2014-11-27 (×3): 40 mg via SUBCUTANEOUS
  Filled 2014-11-25 (×3): qty 0.4

## 2014-11-25 MED ORDER — SODIUM CHLORIDE 0.9 % IV BOLUS (SEPSIS)
500.0000 mL | Freq: Once | INTRAVENOUS | Status: AC
  Administered 2014-11-25: 500 mL via INTRAVENOUS

## 2014-11-25 MED ORDER — THIAMINE HCL 100 MG/ML IJ SOLN: 100.0000 mg | Freq: Every day | INTRAMUSCULAR | Status: DC

## 2014-11-25 MED ORDER — NICOTINE 21 MG/24HR TD PT24
21.0000 mg | MEDICATED_PATCH | Freq: Every day | TRANSDERMAL | Status: DC
  Administered 2014-11-25 – 2014-11-28 (×4): 21 mg via TRANSDERMAL
  Filled 2014-11-25 (×4): qty 1

## 2014-11-25 MED ORDER — ONDANSETRON HCL 4 MG/2ML IJ SOLN
4.0000 mg | Freq: Once | INTRAMUSCULAR | Status: AC
  Administered 2014-11-25: 4 mg via INTRAVENOUS
  Filled 2014-11-25: qty 2

## 2014-11-25 MED ORDER — ACETAMINOPHEN 325 MG PO TABS
650.0000 mg | ORAL_TABLET | Freq: Four times a day (QID) | ORAL | Status: DC | PRN
  Administered 2014-11-28: 650 mg via ORAL
  Filled 2014-11-25: qty 2

## 2014-11-25 MED ORDER — ONDANSETRON HCL 4 MG PO TABS
4.0000 mg | ORAL_TABLET | Freq: Four times a day (QID) | ORAL | Status: DC | PRN
  Administered 2014-11-26 (×3): 4 mg via ORAL
  Filled 2014-11-25 (×3): qty 1

## 2014-11-25 MED ORDER — ONDANSETRON HCL 4 MG/2ML IJ SOLN
4.0000 mg | Freq: Four times a day (QID) | INTRAMUSCULAR | Status: DC | PRN
  Administered 2014-11-25: 4 mg via INTRAVENOUS
  Filled 2014-11-25: qty 2

## 2014-11-25 MED ORDER — GI COCKTAIL ~~LOC~~
30.0000 mL | Freq: Once | ORAL | Status: AC
  Administered 2014-11-25: 30 mL via ORAL
  Filled 2014-11-25: qty 30

## 2014-11-25 MED ORDER — GI COCKTAIL ~~LOC~~: 30.0000 mL | Freq: Two times a day (BID) | ORAL | Status: DC | PRN

## 2014-11-25 MED ORDER — METOPROLOL TARTRATE 25 MG PO TABS
25.0000 mg | ORAL_TABLET | Freq: Every day | ORAL | Status: DC
  Administered 2014-11-25 – 2014-11-27 (×3): 25 mg via ORAL
  Filled 2014-11-25 (×3): qty 1

## 2014-11-25 MED ORDER — DIPHENHYDRAMINE HCL 25 MG PO CAPS
25.0000 mg | ORAL_CAPSULE | Freq: Four times a day (QID) | ORAL | Status: DC | PRN
Start: 2014-11-25 — End: 2014-11-28
  Administered 2014-11-27 (×2): 25 mg via ORAL
  Filled 2014-11-25 (×3): qty 1

## 2014-11-25 MED ORDER — VITAMIN B-1 100 MG PO TABS
100.0000 mg | ORAL_TABLET | Freq: Every day | ORAL | Status: DC
  Administered 2014-11-25 – 2014-11-28 (×4): 100 mg via ORAL
  Filled 2014-11-25 (×4): qty 1

## 2014-11-25 MED ORDER — NITROGLYCERIN 0.4 MG SL SUBL
0.4000 mg | SUBLINGUAL_TABLET | SUBLINGUAL | Status: DC | PRN
  Administered 2014-11-25 (×3): 0.4 mg via SUBLINGUAL
  Filled 2014-11-25 (×2): qty 1

## 2014-11-25 MED ORDER — ALUM & MAG HYDROXIDE-SIMETH 200-200-20 MG/5ML PO SUSP
30.0000 mL | Freq: Four times a day (QID) | ORAL | Status: DC | PRN
  Administered 2014-11-25 – 2014-11-28 (×4): 30 mL via ORAL
  Filled 2014-11-25 (×4): qty 30

## 2014-11-25 MED ORDER — LORAZEPAM 2 MG/ML IJ SOLN: 1.0000 mg | Freq: Four times a day (QID) | INTRAMUSCULAR | Status: AC | PRN

## 2014-11-25 MED ORDER — ADULT MULTIVITAMIN W/MINERALS CH: 1.0000 | ORAL_TABLET | Freq: Every day | ORAL | Status: DC

## 2014-11-25 MED ORDER — ATORVASTATIN CALCIUM 40 MG PO TABS
40.0000 mg | ORAL_TABLET | Freq: Every day | ORAL | Status: DC
  Administered 2014-11-25 – 2014-11-28 (×4): 40 mg via ORAL
  Filled 2014-11-25 (×4): qty 1

## 2014-11-25 MED ORDER — MORPHINE SULFATE (PF) 2 MG/ML IV SOLN
2.0000 mg | INTRAVENOUS | Status: DC | PRN
  Administered 2014-11-25 (×2): 2 mg via INTRAVENOUS
  Filled 2014-11-25 (×3): qty 1

## 2014-11-25 MED ORDER — ALPRAZOLAM 0.25 MG PO TABS
0.2500 mg | ORAL_TABLET | Freq: Three times a day (TID) | ORAL | Status: DC | PRN
  Administered 2014-11-25 – 2014-11-26 (×3): 0.25 mg via ORAL
  Filled 2014-11-25 (×3): qty 1

## 2014-11-25 NOTE — ED Provider Notes (Signed)
CSN: [id number]     Arrival date & time 11/25/14  5621 History   First MD Initiated Contact with Patient 11/25/14 0845     Chief Complaint  Patient presents with  . Chest Pain     (Consider location/radiation/quality/duration/timing/severity/associated sxs/prior Treatment) Patient is a 64 y.o. male presenting with chest pain. The history is provided by the patient.  Chest Pain Associated symptoms: fatigue, nausea and vomiting   Associated symptoms: no abdominal pain, no back pain, no headache, no numbness, no shortness of breath and no weakness    patient has had chest pain since last night. His his mid chest. Also has had nausea vomiting diarrhea. States it does not feel like his previous heart problems. Patient denies ever having stenting. Review records show he doesn't coronary artery disease but has had PTCA not stenting. No fevers. Denies substance abuse. States he just feels kind of bad. He has not had much of an appetite.  Past Medical History  Diagnosis Date  . MVP (mitral valve prolapse)   . Anxiety   . Hypertension   . Hyperlipidemia   . Palpitations   . Bipolar disorder (HCC)   . Depression   . Esophagitis   . Gastritis   . Insomnia   . Hypercholesteremia   . Coronary artery disease     a.  s/p PTCA to PL branch of RCA in 2007;  b.  Last LHC 04/2008:  LM with irregularities, LAD 50%, mid LAD 50%, proximal D1 80-90%, proximal OM1 30-40%, proximal OM2 50%, PLB branch 30-40%, site of prior angioplasty in the RCA widely patent, EF 65%=>Med Rx;  c. Nuclear study a 12/2011: EF 71% and normal perfusion.    Marland Kitchen History of echocardiogram     Echo 08/16/09: Mild LVH, EF 55-60%, no normal wall motion, grade 1 diastolic dysfunction  . Anginal pain (HCC)   . Pneumonia 2005  . GERD (gastroesophageal reflux disease)   . Arthritis     "hands; especially in cold weather" (12/11/2013)  . Alcohol related seizure (HCC)     "when I go off it"   Past Surgical History  Procedure Laterality  Date  . Coronary angioplasty  2007    RCA  . Cardiac catheterization  04/2008    EF 65%  . Knee arthroscopy, medial patello femoral ligament repair    . Hip arthroplasty Bilateral     "both replaced twice"   . Total shoulder replacement Right   . Total hip revision  02/07/2012    Procedure: TOTAL HIP REVISION;  Surgeon: [name], MD;  Location: [facility] OR;  Service: Orthopedics;  Laterality: Left;  [place] Hip Revision Set  . Total knee arthroplasty Right   . Appendectomy  1958  . Tonsillectomy  1958  . Joint replacement     Family History  Problem Relation Age of Onset  . Heart disease Neg Hx   . Prostate cancer Father    Social History  Substance Use Topics  . Smoking status: Current Some Day Smoker -- 0.50 packs/day for 40 years    Types: Cigarettes  . Smokeless tobacco: Never Used  . Alcohol Use: 0.0 oz/week     Comment: patient drinks 2-3 times a week    Review of Systems  Constitutional: Positive for fatigue. Negative for activity change and appetite change.  Eyes: Negative for pain.  Respiratory: Negative for chest tightness and shortness of breath.   Cardiovascular: Positive for chest pain. Negative for leg swelling.  Gastrointestinal: Positive for  nausea and vomiting. Negative for abdominal pain and diarrhea.  Genitourinary: Negative for flank pain.  Musculoskeletal: Negative for back pain and neck stiffness.  Skin: Negative for rash.  Neurological: Negative for weakness, numbness and headaches.  Psychiatric/Behavioral: Negative for behavioral problems.      Allergies  Ambien and Codeine  Home Medications   Prior to Admission medications   Medication Sig Start Date End Date Taking? Authorizing Provider  ALPRAZolam Prudy Feeler(XANAX) 0.5 MG tablet Take 0.25-0.5 tablets by mouth 3 (three) times daily as needed. 08/31/14  Yes Historical Provider, MD  aspirin EC 325 MG tablet Take 1 tablet (325 mg total) by mouth every morning. For blood thinner for heart health 04/02/14   Yes Alison MurrayAlma M Devine, MD  atorvastatin (LIPITOR) 40 MG tablet Take 1 tablet (40 mg total) by mouth daily. For cholesterol 10/24/12  Yes Sanjuana KavaAgnes I Nwoko, NP  buPROPion (WELLBUTRIN XL) 300 MG 24 hr tablet Take 1 tablet (300 mg total) by mouth daily. For depression 10/24/12  Yes Sanjuana KavaAgnes I Nwoko, NP  ibuprofen (ADVIL,MOTRIN) 200 MG tablet Take 400 mg by mouth every 6 (six) hours as needed for moderate pain.   Yes Historical Provider, MD  metoprolol tartrate (LOPRESSOR) 25 MG tablet Take 25 mg by mouth at bedtime.   Yes Historical Provider, MD  QUEtiapine (SEROQUEL XR) 50 MG TB24 24 hr tablet Take 2 tablets (100 mg total) by mouth at bedtime. Patient taking differently: Take 100 mg by mouth daily.  07/19/14  Yes Shanker Levora DredgeM Ghimire, MD  folic acid (FOLVITE) 1 MG tablet Take 1 tablet (1 mg total) by mouth daily. Patient not taking: Reported on 07/15/2014 06/16/14   Drema Dallasurtis J Woods, MD  Multiple Vitamin (MULTIVITAMIN WITH MINERALS) TABS tablet Take 1 tablet by mouth daily. Patient not taking: Reported on 11/25/2014 04/02/14   Alison MurrayAlma M Devine, MD  nitroGLYCERIN (NITROSTAT) 0.4 MG SL tablet Place 1 tablet (0.4 mg total) under the tongue every 5 (five) minutes as needed for chest pain (CP or SOB). 10/24/12   Sanjuana KavaAgnes I Nwoko, NP  pantoprazole (PROTONIX) 40 MG tablet Take 1 tablet (40 mg total) by mouth daily at 12 noon. Patient not taking: Reported on 11/25/2014 07/19/14   Maretta BeesShanker M Ghimire, MD  thiamine 100 MG tablet Take 1 tablet (100 mg total) by mouth daily. Patient not taking: Reported on 06/13/2014 04/02/14   Alison MurrayAlma M Devine, MD   BP 86/60 mmHg  Pulse 116  Temp(Src) 98.6 F (37 C) (Oral)  Resp 20  Ht 5\' 10"  (1.778 m)  Wt 181 lb 8 oz (82.328 kg)  BMI 26.04 kg/m2  SpO2 99% Physical Exam  Constitutional: He is oriented to person, place, and time. He appears well-developed and well-nourished.  HENT:  Head: Normocephalic and atraumatic.  Eyes: Pupils are equal, round, and reactive to light.  Neck: Normal range of motion.  Neck supple.  Cardiovascular: Normal rate, regular rhythm and normal heart sounds.   No murmur heard. Pulmonary/Chest: Effort normal and breath sounds normal.  Abdominal: Soft. Bowel sounds are normal. He exhibits no distension. There is no tenderness. There is no rebound.  Musculoskeletal: Normal range of motion. He exhibits no edema.  Neurological: He is alert and oriented to person, place, and time. No cranial nerve deficit.  Skin: Skin is warm and dry.  Psychiatric: He has a normal mood and affect.  Nursing note and vitals reviewed.   ED Course  Procedures (including critical care time) Labs Review Labs Reviewed  BASIC METABOLIC PANEL - Abnormal;  Notable for the following:    Sodium 116 (*)    Chloride 79 (*)    CO2 21 (*)    BUN <5 (*)    Anion gap 16 (*)    All other components within normal limits  CBC - Abnormal; Notable for the following:    Hemoglobin 11.9 (*)    HCT 36.7 (*)    MCH 25.6 (*)    RDW 17.3 (*)    All other components within normal limits  URINE RAPID DRUG SCREEN, HOSP PERFORMED - Abnormal; Notable for the following:    Benzodiazepines POSITIVE (*)    All other components within normal limits  ETHANOL - Abnormal; Notable for the following:    Alcohol, Ethyl (B) 87 (*)    All other components within normal limits  URINALYSIS, ROUTINE W REFLEX MICROSCOPIC (NOT AT [facility]) - Abnormal; Notable for the following:    Ketones, ur 15 (*)    All other components within normal limits  OSMOLALITY - Abnormal; Notable for the following:    Osmolality 259 (*)    All other components within normal limits  BASIC METABOLIC PANEL - Abnormal; Notable for the following:    Sodium 120 (*)    Chloride 84 (*)    CO2 21 (*)    BUN <5 (*)    All other components within normal limits  URINE CULTURE  TROPONIN I  SODIUM, URINE, RANDOM  TROPONIN I  OSMOLALITY, URINE  TROPONIN I  TROPONIN I  BASIC METABOLIC PANEL    Imaging Review Dg Chest 2 View  11/25/2014  CLINICAL  DATA:  Chest pain. EXAM: CHEST - 2 VIEW COMPARISON:  07/16/2014 FINDINGS: The heart size and mediastinal contours are within normal limits. Atelectasis present at both lung bases, left greater than right. There is no evidence of pulmonary edema, consolidation, pneumothorax, nodule or pleural fluid. Stable old right-sided rib fractures. IMPRESSION: No active disease.  Bibasilar atelectasis. Electronically Signed   By: Irish Lack M.D.   On: 11/25/2014 09:54   I have personally reviewed and evaluated these images and lab results as part of my medical decision-making.   EKG Interpretation   Date/Time:  Wednesday November 25 2014 08:47:12 EDT Ventricular Rate:  103 PR Interval:  207 QRS Duration: 90 QT Interval:  362 QTC Calculation: 474 R Axis:   -129 Text Interpretation:  Sinus tachycardia Borderline prolonged PR interval  Probable left atrial enlargement Inferior infarct, old Probable  anterolateral infarct, old Confirmed by Aithana Kushner  MD, Shykeem Resurreccion [phone]) on  11/25/2014 10:13:55 AM      MDM   Final diagnoses:  Hyponatremia  Chest pain, unspecified chest pain type    Patient with chest pain. EKG reassuring but found to be hyponatremic. Has had history of same due to his alcohol use. Will admit to internal medicine.    Benjiman Core, MD 11/25/14 [phone]

## 2014-11-25 NOTE — ED Notes (Signed)
PT placed in gown and in bed. Pt monitored by pulse ox, bp cuff, and 12-lead. 

## 2014-11-25 NOTE — H&P (Signed)
[facility] History and Physical  [name] ZOX:[id number] DOB: [birth year] DOA: 11/25/2014  Referring physician: Dr Rubin Payor PCP: Thayer Headings, MD   Chief Complaint: Nausea , vomiting, diarrhea, chest pain  HPI: [name] is a 64 y.o. male with CAD, hypertension, alcohol abuse, bipolar disorder, grade 1 diastolic dysfunction and tobacco use who presents to the ED with hyponatremia, nausea , vomiting, diarrhea and chest pain. Patient states that he has been having diarrhea, nausea and vomiting since last night at approximately 1:30 am and experiencing chest pain that feels like a brick is sitting on his chest. The pain radiates thru to his back. Zofran improved the pain. He says that he has not been eating well for the past two weeks.  Patient states that he had two beers before coming to the ER He denies chest pain that radiates, seizures, shortness of breath, hematuria or blood in stool.   ER work up shows  Na of 116, chloride 79, troponin 0.03, ethyl alcohol 87.  CXR is negative for source of pain,  EKG does not show ST changes.  Patient is nauseated and dry heaving.    Review of Systems:  Constitutional: No weight loss, Fevers.   HEENT: No headaches, Difficulty swallowing, Sore throat,  Cardio-vascular: positive for chest pain,  palpitations .  No orthopnea, PND, swelling in lower extremities, anasarca, dizziness GI: positive for nausea, vomiting , diarrhea and loss of appetite, no heartburn, indigestion, abdominal pain, change in bowel habits Resp: No shortness of breath with exertion or at rest. No excess mucus, no productive cough, No non-productive cough, No coughing up of blood.No change in color of mucus.No wheezing.No chest wall deformity  Skin: no rash or lesions.  GU: no dysuria, change in color of urine, no urgency or frequency. No flank pain.  Musculoskeletal: No joint pain or swelling. No back pain. Psych: No change in mood or affect. No depression or anxiety. No  memory loss.   Past Medical History  Diagnosis Date  . MVP (mitral valve prolapse)   . Anxiety   . Hypertension   . Hyperlipidemia   . Palpitations   . Bipolar disorder (HCC)   . Depression   . Esophagitis   . Gastritis   . Insomnia   . Hypercholesteremia   . Coronary artery disease     a.  s/p PTCA to PL branch of RCA in 2007;  b.  Last LHC 04/2008:  LM with irregularities, LAD 50%, mid LAD 50%, proximal D1 80-90%, proximal OM1 30-40%, proximal OM2 50%, PLB branch 30-40%, site of prior angioplasty in the RCA widely patent, EF 65%=>Med Rx;  c. Nuclear study a 12/2011: EF 71% and normal perfusion.    Marland Kitchen History of echocardiogram     Echo 08/16/09: Mild LVH, EF 55-60%, no normal wall motion, grade 1 diastolic dysfunction  . Anginal pain (HCC)   . Pneumonia 2005  . GERD (gastroesophageal reflux disease)   . Arthritis     "hands; especially in cold weather" (12/11/2013)  . Alcohol related seizure (HCC)     "when I go off it"   Past Surgical History  Procedure Laterality Date  . Coronary angioplasty  2007    RCA  . Cardiac catheterization  04/2008    EF 65%  . Knee arthroscopy, medial patello femoral ligament repair    . Hip arthroplasty Bilateral     "both replaced twice"   . Total shoulder replacement Right   . Total hip revision  02/07/2012  Procedure: TOTAL HIP REVISION;  Surgeon: [name], MD;  Location: [facility] OR;  Service: Orthopedics;  Laterality: Left;  [place] Hip Revision Set  . Total knee arthroplasty Right   . Appendectomy  1958  . Tonsillectomy  1958  . Joint replacement     Social History:  reports that he has been smoking Cigarettes.  He has a 20 pack-year smoking history. He has never used smokeless tobacco. He reports that he drinks alcohol. He reports that he uses illicit drugs (Benzodiazepines) about 7 times per week.  Lives at home alone, independent with ADLs.  Allergies  Allergen Reactions  . Ambien [Zolpidem Tartrate] Other (See Comments)    Sleep  Walking  . Codeine Itching    Family History  Problem Relation Age of Onset  . Heart disease Neg Hx   . Prostate cancer Father    Prior to Admission medications   Medication Sig Start Date End Date Taking? Authorizing Provider  ALPRAZolam Prudy Feeler) 0.5 MG tablet Take 0.25-0.5 tablets by mouth 3 (three) times daily as needed. 08/31/14  Yes Historical Provider, MD  aspirin EC 325 MG tablet Take 1 tablet (325 mg total) by mouth every morning. For blood thinner for heart health 04/02/14  Yes Alison Murray, MD  atorvastatin (LIPITOR) 40 MG tablet Take 1 tablet (40 mg total) by mouth daily. For cholesterol 10/24/12  Yes Sanjuana Kava, NP  buPROPion (WELLBUTRIN XL) 300 MG 24 hr tablet Take 1 tablet (300 mg total) by mouth daily. For depression 10/24/12  Yes Sanjuana Kava, NP  ibuprofen (ADVIL,MOTRIN) 200 MG tablet Take 400 mg by mouth every 6 (six) hours as needed for moderate pain.   Yes Historical Provider, MD  metoprolol tartrate (LOPRESSOR) 25 MG tablet Take 25 mg by mouth at bedtime.   Yes Historical Provider, MD  QUEtiapine (SEROQUEL XR) 50 MG TB24 24 hr tablet Take 2 tablets (100 mg total) by mouth at bedtime. Patient taking differently: Take 100 mg by mouth daily.  07/19/14  Yes Shanker Levora Dredge, MD  folic acid (FOLVITE) 1 MG tablet Take 1 tablet (1 mg total) by mouth daily. Patient not taking: Reported on 07/15/2014 06/16/14   Drema Dallas, MD  Multiple Vitamin (MULTIVITAMIN WITH MINERALS) TABS tablet Take 1 tablet by mouth daily. Patient not taking: Reported on 11/25/2014 04/02/14   Alison Murray, MD  nitroGLYCERIN (NITROSTAT) 0.4 MG SL tablet Place 1 tablet (0.4 mg total) under the tongue every 5 (five) minutes as needed for chest pain (CP or SOB). 10/24/12   Sanjuana Kava, NP  pantoprazole (PROTONIX) 40 MG tablet Take 1 tablet (40 mg total) by mouth daily at 12 noon. Patient not taking: Reported on 11/25/2014 07/19/14   Maretta Bees, MD  thiamine 100 MG tablet Take 1 tablet (100 mg total) by  mouth daily. Patient not taking: Reported on 06/13/2014 04/02/14   Alison Murray, MD   Physical Exam: Filed Vitals:   11/25/14 0945 11/25/14 1000 11/25/14 1030 11/25/14 1100  BP: 119/82 115/85 112/79 116/92  Pulse: 101 103 107 113  Temp:      TempSrc:      Resp: 18 19 19 21   SpO2: 97% 97% 96% 98%    Wt Readings from Last 3 Encounters:  07/15/14 77.111 kg (170 lb)  06/13/14 85.3 kg (188 lb 0.8 oz)  05/22/14 81.194 kg (179 lb)    General:  Nauseated, bringing up clear sputum, Wd but ill appearing. Eyes: PERRL, normal lids,  irises & conjunctiva ENT: grossly normal hearing, lips & tongue Neck: no LAD, masses or thyromegaly Cardiovascular: RRR, no m/r/g. No LE edema. Respiratory: CTA bilaterally, no w/r/r. Decreased respiratory effort due to nausea Abdomen: soft, ntnd, +active bs, no masses Skin: no rash or induration seen on limited exam Musculoskeletal: grossly normal tone BUE/BLE Psychiatric: grossly normal mood and affect, speech fluent and appropriate Neurologic: grossly non-focal.          Labs on Admission:  Basic Metabolic Panel:  Recent Labs Lab 11/25/14 0910  NA 116*  K 4.0  CL 79*  CO2 21*  GLUCOSE 80  BUN <5*  CREATININE 0.94  CALCIUM 10.1   CBC:  Recent Labs Lab 11/25/14 0910  WBC 8.6  HGB 11.9*  HCT 36.7*  MCV 79.1  PLT 388   Cardiac Enzymes:  Recent Labs Lab 11/25/14 0910  TROPONINI <0.03    BNP (last 3 results)  Recent Labs  03/30/14 2346 05/22/14 1902 06/12/14 2338  BNP 64.6 61.5 75.8     Radiological Exams on Admission: Dg Chest 2 View  11/25/2014  CLINICAL DATA:  Chest pain. EXAM: CHEST - 2 VIEW COMPARISON:  07/16/2014 FINDINGS: The heart size and mediastinal contours are within normal limits. Atelectasis present at both lung bases, left greater than right. There is no evidence of pulmonary edema, consolidation, pneumothorax, nodule or pleural fluid. Stable old right-sided rib fractures. IMPRESSION: No active disease.   Bibasilar atelectasis. Electronically Signed   By: Irish LackGlenn  Yamagata M.D.   On: 11/25/2014 09:54    EKG: Independently reviewed. Sinus tach.     Assessment/Plan Principal Problem:   Hyponatremia Active Problems:   HTN (hypertension)   Hyperlipidemia   Polysubstance abuse   Bipolar disorder (HCC)   Chronic diastolic heart failure (HCC)   Chest pain   Alcohol abuse   Dehydration   Coronary artery disease involving native coronary artery of native heart without angina pectoris   Hypochloremia   Nausea vomiting and diarrhea   Alcohol withdrawal seizure (HCC)   Hyponatremia (recurrent) Most likely due to GI losses, poor PO intake and Alcohol use Baseline sodium approximately 124 - 130. 116 in ER today. Patient hypovolemic with hypochloremia. Will check urine / serum osmols, urine sodium, urine creatinine. Will give gentle IVF at 100 ml/hour. Recheck bmet this afternoon and adjust IVF as appropriate.  Chest pain Likely secondary to vomiting. Was temporarily relieved with zofran. POC Trop is < 0.03. No obvious ST changes on EKG. Cycle troponin, restart protonix  Nausea / Vomiting / Diarrhea Acute onset. Likely Gastroenteritis in combination with alcohol abuse. Will treat supportively with IVF and Zofran. Has not had a prolonged hospital stay recently. No antibiotics. Will not check c-diff.  ETOH abuse Patient reports only two beers in last week. ETOH positive on admission (87). CIWA protocol ordered. Social work Administrator, sportsconsultation.   Diastolic Dysfunction Currently stable. Most recent echo 03/31/14 Study Conclusions:  - Left ventricle: The cavity size was normal. There was moderate focal basal and mild concentric hypertrophy. Systolic function was normal. The estimated ejection fraction was in the range of 60% to 65%. Wall motion was normal; there were no regional wall motion abnormalities. There was an increased relative contribution of atrial contraction to ventricular  filling. Doppler parameters are consistent with abnormal left ventricular relaxation (grade 1 diastolic dysfunction).  CAD with HTN and HLD Stable. Continue Aspirin 325 mg. Metoprolol, and atorvastatin.   Depression Patient appears depressed. Continue bupropion, Xanax, seroquel. He lost his brother last Christmas  to cirrhosis. He is at home alone. Likely needs on-going counseling.    Code Status: Full code DVT Prophylaxis: Family Communication:  None at bedside Disposition Plan: inpatient Time spent: 70 minutes  Edgar Frisk PA-S [facility] [place] Downs, [place] [facility] Pager: [phone]

## 2014-11-25 NOTE — Progress Notes (Signed)
Pt complained of chest pain 9/10. Md notified. [name] DownsMarianne York. Nitroglycerin given x 2. Pain now 8/10. will page md.

## 2014-11-25 NOTE — Progress Notes (Signed)
Nitro 0.4 mg sublingual x 3doses given for Chest pain 8 out of 10. Will monitor closely.

## 2014-11-25 NOTE — ED Notes (Signed)
From home via [facility], CP, SOB, N/D onset last night, VSS  2mg  IM Zofran 324 ASA

## 2014-11-26 DIAGNOSIS — I10 Essential (primary) hypertension: Secondary | ICD-10-CM

## 2014-11-26 DIAGNOSIS — R112 Nausea with vomiting, unspecified: Secondary | ICD-10-CM

## 2014-11-26 DIAGNOSIS — F1010 Alcohol abuse, uncomplicated: Secondary | ICD-10-CM

## 2014-11-26 DIAGNOSIS — E871 Hypo-osmolality and hyponatremia: Principal | ICD-10-CM

## 2014-11-26 DIAGNOSIS — F314 Bipolar disorder, current episode depressed, severe, without psychotic features: Secondary | ICD-10-CM

## 2014-11-26 DIAGNOSIS — R197 Diarrhea, unspecified: Secondary | ICD-10-CM

## 2014-11-26 DIAGNOSIS — F10230 Alcohol dependence with withdrawal, uncomplicated: Secondary | ICD-10-CM

## 2014-11-26 LAB — BASIC METABOLIC PANEL
ANION GAP: 11 (ref 5–15)
Anion gap: 14 (ref 5–15)
BUN: 7 mg/dL (ref 6–20)
BUN: <5 mg/dL — ABNORMAL LOW (ref 6–20)
CHLORIDE: 87 mmol/L — AB (ref 101–111)
CO2: 23 mmol/L (ref 22–32)
CO2: 22 mmol/L (ref 22–32)
Calcium: 8.7 mg/dL — ABNORMAL LOW (ref 8.9–10.3)
Calcium: 9.1 mg/dL (ref 8.9–10.3)
Chloride: 86 mmol/L — ABNORMAL LOW (ref 101–111)
Creatinine, Ser: 1.05 mg/dL (ref 0.61–1.24)
Creatinine, Ser: 0.95 mg/dL (ref 0.61–1.24)
GFR calc Af Amer: >60 mL/min (ref >60)
GFR calc non Af Amer: >60 mL/min (ref >60)
GLUCOSE: 107 mg/dL — AB (ref 65–99)
Glucose, Bld: 114 mg/dL — ABNORMAL HIGH (ref 65–99)
POTASSIUM: 3.1 mmol/L — AB (ref 3.5–5.1)
POTASSIUM: 3.5 mmol/L (ref 3.5–5.1)
SODIUM: 123 mmol/L — AB (ref 135–145)
Sodium: 120 mmol/L — ABNORMAL LOW (ref 135–145)

## 2014-11-26 LAB — URINE CULTURE: Culture: 1000

## 2014-11-26 LAB — TROPONIN I: Troponin I: <0.03 ng/mL (ref <0.031)

## 2014-11-26 LAB — MAGNESIUM: Magnesium: 2 mg/dL (ref 1.7–2.4)

## 2014-11-26 MED ORDER — POTASSIUM CHLORIDE CRYS ER 20 MEQ PO TBCR
40.0000 meq | EXTENDED_RELEASE_TABLET | Freq: Once | ORAL | Status: AC
  Administered 2014-11-26: 40 meq via ORAL
  Filled 2014-11-26: qty 2

## 2014-11-26 MED ORDER — OXYCODONE HCL 5 MG PO TABS
5.0000 mg | ORAL_TABLET | Freq: Four times a day (QID) | ORAL | Status: DC | PRN
  Administered 2014-11-26: 5 mg via ORAL
  Filled 2014-11-26: qty 1

## 2014-11-26 MED ORDER — PANTOPRAZOLE SODIUM 40 MG PO TBEC
40.0000 mg | DELAYED_RELEASE_TABLET | Freq: Two times a day (BID) | ORAL | Status: DC
  Administered 2014-11-27 – 2014-11-28 (×3): 40 mg via ORAL
  Filled 2014-11-26 (×3): qty 1

## 2014-11-26 MED ORDER — SODIUM CHLORIDE 0.9 % IV SOLN
INTRAVENOUS | Status: AC
  Administered 2014-11-26: 17:00:00 via INTRAVENOUS
  Administered 2014-11-27: 1000 mL via INTRAVENOUS

## 2014-11-26 NOTE — Evaluation (Signed)
Occupational Therapy Evaluation Patient Details Name: [name] MRN: [id number] DOB: 01/30/1950 Today's Date: 11/26/2014    History of Present Illness   64 y.o. Male presenting with nausea, vomitting, diarrhea and chest pain. PMH: CAD, mitral valve prolapse, HTN, Hyperlipidemia, Hypercholesteremia, Anginal pain and alcoholism.   Clinical Impression   Pt admitted to hospital due to reason stated above. Pt currently with functional limitiations due to the deficits listed below (see OT problem list). Prior to admission pt was independent with ADLs. Pt currently minimal assistance for safety with ADLs. Pt will benefit from skilled OT to increase his independence and safety with ADLs and balance to allow safe discharge home.  Recommending psych consult, due to pt seeming depressed due to loss of younger brother and indicating using alcohol to help him cope with grief.   Follow Up Recommendations  No OT follow up    Equipment Recommendations  3 in 1 bedside comode    Recommendations for Other Services Other (comment) (Psych consult)     Precautions / Restrictions Restrictions Weight Bearing Restrictions: No      Mobility Bed Mobility Overal bed mobility: Needs Assistance Bed Mobility: Supine to Sit     Supine to sit: Supervision        Transfers Overall transfer level: Needs assistance Equipment used: None Transfers: Sit to/from Stand Sit to Stand: Supervision         General transfer comment: Pt reports being "shakey" while standing requires supervision for safety    Balance Overall balance assessment: Needs assistance Sitting-balance support: No upper extremity supported;Feet supported Sitting balance-Leahy Scale: Good     Standing balance support: No upper extremity supported;During functional activity Standing balance-Leahy Scale: Fair Standing balance comment: Pt able to void into bedside urinal with no UE support, however reports being "shakey" while  standing                            ADL Overall ADL's : Needs assistance/impaired Eating/Feeding: Independent;Sitting   Grooming: Wash/dry face;Oral care;Set up;Sitting;Bed level   Upper Body Bathing: Minimal assitance;Sitting   Lower Body Bathing: Minimal assistance;Sit to/from stand   Upper Body Dressing : Minimal assistance;Sitting   Lower Body Dressing: Minimal assistance;Sitting/lateral leans;Sit to/from stand Lower Body Dressing Details (indicate cue type and reason): pt unable to don/doff socks requiring min A, pt reports being "too shakey to complete himself"               General ADL Comments: Pt educated in energy conservation techniques and adaptive equipment to assist with ADL safety and completion. Pt able to perform sit to stand at bed to void into bedside urinal.     Vision     Perception     Praxis      Pertinent Vitals/Pain Pain Assessment: 0-10 Pain Score: 7  Pain Location: chest Pain Descriptors / Indicators: Pressure Pain Intervention(s): Monitored during session;Repositioned     Hand Dominance Right   Extremity/Trunk Assessment Upper Extremity Assessment Upper Extremity Assessment: Overall WFL for tasks assessed   Lower Extremity Assessment Lower Extremity Assessment: Defer to PT evaluation   Cervical / Trunk Assessment Cervical / Trunk Assessment: Normal   Communication Communication Communication: No difficulties   Cognition Arousal/Alertness: Awake/alert Behavior During Therapy: WFL for tasks assessed/performed Overall Cognitive Status: Within Functional Limits for tasks assessed                     General Comments  Exercises       Shoulder Instructions      Home Living Family/patient expects to be discharged to:: Private residence Living Arrangements: Alone Available Help at Discharge: Friend(s);Available PRN/intermittently Type of Home: Apartment Home Access: Level entry     Home Layout:  One level     Bathroom Shower/Tub: Chief Strategy OfficerTub/shower unit   Bathroom Toilet: Standard     Home Equipment: Cane - single point   Additional Comments: Pt drives      Prior Functioning/Environment Level of Independence: Independent             OT Diagnosis: Generalized weakness;Acute pain   OT Problem List: Decreased strength;Decreased activity tolerance;Impaired balance (sitting and/or standing);Decreased knowledge of use of DME or AE;Pain   OT Treatment/Interventions: Self-care/ADL training;Therapeutic exercise;Energy conservation;DME and/or AE instruction;Therapeutic activities;Patient/family education;Balance training    OT Goals(Current goals can be found in the care plan section) Acute Rehab OT Goals Patient Stated Goal: to return to prior level of independence OT Goal Formulation: With patient Time For Goal Achievement: 12/10/14 Potential to Achieve Goals: Good ADL Goals Pt Will Perform Grooming: with modified independence;standing Pt Will Perform Upper Body Bathing: with modified independence;with adaptive equipment;sitting Pt Will Perform Lower Body Bathing: with modified independence;with adaptive equipment;sit to/from stand Pt Will Perform Upper Body Dressing: with modified independence;sitting Pt Will Perform Lower Body Dressing: with modified independence;with adaptive equipment;sit to/from stand Pt Will Transfer to Toilet: with modified independence;ambulating;regular height toilet Pt Will Perform Toileting - Clothing Manipulation and hygiene: with modified independence;sit to/from stand Pt Will Perform Tub/Shower Transfer: Tub transfer;with supervision;ambulating;3 in 1 Pt/caregiver will Perform Home Exercise Program: Increased strength;Both right and left upper extremity;With theraband;With Supervision Additional ADL Goal #1: Pt will score 52 or higher on BERG balance assessement to decrease risk of falls during ADLs.  OT Frequency: Min 2X/week   Barriers to D/C:             Co-evaluation              End of Session Equipment Utilized During Treatment: Oxygen  Activity Tolerance: Patient limited by pain Patient left: in bed;with call bell/phone within reach   Time: 1037-1059 OT Time Calculation (min): 22 min Charges:  OT General Charges $OT Visit: 1 Procedure OT Evaluation $Initial OT Evaluation Tier I: 1 Procedure G-Codes:    [name] HousemanJames Landon Raahim [name] 11/26/2014, 12:26 PM

## 2014-11-26 NOTE — Progress Notes (Signed)
Initial Nutrition Assessment   INTERVENTION:  Diet advancement per MD Monitor magnesium, potassium, and phosphorus daily for at least 3 days, MD to replete as needed, as pt is at risk for refeeding syndrome given minimal PO intake x 2 weeks and alcohol abuse. Continue Boost Breeze po TID, each supplement provides 250 kcal and 9 grams of protein Change supplement to Ensure Enlive when diet is advanced   NUTRITION DIAGNOSIS:   Inadequate oral intake related to social / environmental circumstances, poor appetite as evidenced by energy intake < 75% for > 7 days, mild depletion of muscle mass.   GOAL:   Patient will meet greater than or equal to 90% of their needs   MONITOR:   Diet advancement, PO intake, Labs, Weight trends  REASON FOR ASSESSMENT:   Consult Diet education  ASSESSMENT:   64 y.o. male with CAD, hypertension, alcohol abuse, bipolar disorder, grade 1 diastolic dysfunction and tobacco use who presents to the ED with hyponatremia, nausea , vomiting, diarrhea and chest pain.   Pt states that he has been eating very poorly for the past 2 weeks due to depression and alcohol abuse. He reports eating small amounts of vegetable soup as tolerated. He reports going through periods of alcohol abuse and poor PO intake off and on for the past 10 months. When he is doing better, he eats healthy with lean protein, steamed vegetables, and whole grains. He denies any weight loss, stating that he usually weighs 170-175 lbs.  RD emphasized the importance of sobriety and eating consistently. Provided and discussed "Sobriety Nutrition Therapy" handout from the Academy of [employer] Dietetics.   Labs: low sodium, low potassium, low chloride, low calcium  Diet Order:  Diet clear liquid Room service appropriate?: Yes; Fluid consistency:: Thin  Skin:  Reviewed, no issues  Last BM:  11/1  Height:   Ht Readings from Last 1 Encounters:  11/25/14 5\' 10"  (1.778 m)    Weight:   Wt  Readings from Last 1 Encounters:  11/26/14 177 lb 14.4 oz (80.695 kg)    Ideal Body Weight:  75.5 kg  BMI:  Body mass index is 25.53 kg/(m^2).  Estimated Nutritional Needs:   Kcal:  2000-2200  Protein:  90-100 grams  Fluid:  2-2.2 L/day  EDUCATION NEEDS:   No education needs identified at this time  Dorothea Ogleeanne Chasiti Waddington RD, LDN Inpatient Clinical Dietitian Pager: [phone] After Hours Pager: [phone]

## 2014-11-26 NOTE — Progress Notes (Signed)
Physical Therapy Treatment Patient Details Name: [name] MRN: [id number] DOB: Aug 28, 1950 Today's Date: 11/26/2014    History of Present Illness 64 y.o. Male presenting with nausea, vomitting, diarrhea and chest pain. PMH: CAD, mitral valve prolapse, HTN, Hyperlipidemia, Hypercholesteremia, Anginal pain and alcoholism.    PT Comments    Pt admitted with/for N,V,D and chest pain.  Pt currently limited functionally due to the problems listed below.  (see problems list.)  Pt will benefit from PT to maximize function and safety to be able to get home safely with available assist friends .   Follow Up Recommendations  No PT follow up;Other (comment) (pt hopes to get some grief and ETOH rehab.)     Equipment Recommendations  None recommended by PT    Recommendations for Other Services       Precautions / Restrictions Precautions Precautions: None (minimal fall risk) Restrictions Weight Bearing Restrictions: No    Mobility  Bed Mobility Overal bed mobility: Needs Assistance Bed Mobility: Supine to Sit     Supine to sit: Supervision        Transfers Overall transfer level: Needs assistance Equipment used: None Transfers: Sit to/from Stand Sit to Stand: Supervision         General transfer comment: Pt reports being "shakey" while standing requires supervision for safety  Ambulation/Gait Ambulation/Gait assistance: Supervision Ambulation Distance (Feet): 380 Feet Assistive device: None Gait Pattern/deviations: Step-through pattern Gait velocity: slower Gait velocity interpretation: Below normal speed for age/gender General Gait Details: mildly unsteady gait that gets effortful trying to increase speed or more unsteady with scanning or directional changes.  But no LOB   Stairs Stairs: Yes Stairs assistance: Modified independent (Device/Increase time) Stair Management: One rail Right;Alternating pattern;Forwards Number of Stairs: 3 General stair comments:  safe with rail  Wheelchair Mobility    Modified Rankin (Stroke Patients Only)       Balance Overall balance assessment: Needs assistance Sitting-balance support: No upper extremity supported Sitting balance-Leahy Scale: Good       Standing balance-Leahy Scale: Fair (or better) Standing balance comment: pt urinating and donning gown; holding conversation all without assist             High level balance activites: Backward walking;Direction changes;Turns;Head turns (stepping over objects) High Level Balance Comments: Some deviation, but no overt LOB.    Cognition Arousal/Alertness: Awake/alert Behavior During Therapy: WFL for tasks assessed/performed Overall Cognitive Status: Within Functional Limits for tasks assessed                      Exercises      General Comments        Pertinent Vitals/Pain Pain Assessment: Faces Faces Pain Scale: Hurts little more Pain Location: chest Pain Descriptors / Indicators: Pressure    Home Living Family/patient expects to be discharged to:: Private residence Living Arrangements: Alone Available Help at Discharge: Friend(s);Available PRN/intermittently Type of Home: Apartment Home Access: Level entry   Home Layout: One level Home Equipment: Cane - single point Additional Comments: Pt drives    Prior Function Level of Independence: Independent      Comments: Pt reports he goes to gym at least 3 days/wk.  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     PT Goals (current goals can now be found in the care plan section) Acute Rehab PT Goals Patient Stated Goal: to return to prior level of independence PT Goal Formulation: With patient Time For Goal Achievement: 12/03/14 Potential  to Achieve Goals: Good    Frequency  Min 3X/week    PT Plan      Co-evaluation             End of Session   Activity Tolerance: Patient tolerated treatment well Patient left: in bed;with call bell/phone within  reach;with family/visitor present     Time: [id number] PT Time Calculation (min) (ACUTE ONLY): 22 min  Charges:                       G Codes:      Naama Sappington, Eliseo GumKenneth V 11/26/2014, 4:20 PM  11/26/2014  Kiowa BingKen Lakelyn Straus, PT [phone]  (pager)

## 2014-11-26 NOTE — Progress Notes (Signed)
Utilization review completed. Ranata Laughery, RN, BSN. 

## 2014-11-26 NOTE — Progress Notes (Signed)
PROGRESS NOTE    [name] ZOX:[id number] DOB: 04-Dec-1950 DOA: 11/25/2014 PCP: Thayer HeadingsMACKENZIE,BRIAN, MD  HPI/Brief narrative 64 year old male with history of MVP, HTN, HLD, anxiety/depression/bipolar disorder, CAD, gastritis, esophagitis, alcohol dependence and tobacco abuse, presented to [facility] ED on 11/25/14 with complaints of nausea, nonbloody emesis, diarrhea and chest pain. Does not eat or drink well when he is binging on alcohol. ER work up shows Na of 116, chloride 79, troponin 0.03, ethyl alcohol 87. CXR is negative for source of pain, EKG does not show ST changes.   Assessment/Plan:  Hyponatremia - Baseline sodium: 124-130. Admitted with sodium of 116 - Multifactorial secondary to dehydration from GI losses, beer potomania &? SIADH. - Sodium has improved from 116 to 120 in last 24 hours.  - Continue gentle IV normal saline and aim to correct by no more than 8 mEq in 24 hours. - Patient asymptomatic.  Atypical chest pain - Likely GI in origin versus musculoskeletal from multiple emesis. - EKG without acute changes. Troponin 5 negative. - PPI and supportive treatment.  Nausea, vomiting and diarrhea - DD: Acute viral GE versus alcohol abuse related versus alcohol withdrawal. - Much improved. Only nausea at this time. PPI and diet as tolerated.  Alcohol dependence - States that he has been having intermittent binge drinking since his younger brother died 2 weeks before Christmas of 2015. - Apparently drinks 12 pack of beers up to 3-5 days a week. Denies hard liquor. - Moderation/cessation counseled. Continue CIWA protocol.  Depression/anxiety/bipolar disorder - Denies suicidal or homicidal ideations. Psychiatry consultation requested-patient agrees.  Essential hypertension - Controlled.  Tobacco abuse - Cessation counseled  CAD - Management as above. Continue aspirin, metoprolol and statins.  Hypokalemia - Replace and follow. Magnesium 2.  Anemia - Follow  CBCs.   DVT prophylaxis: Lovenox Code Status: Full Family Communication: None at bedside Disposition Plan: DC home when medically stable   Consultants:  Psychiatry  Procedures:  None  Antibiotics:  None   Subjective: Intermittent lower substernal chest discomfort, nonradiating, moderate, no associated dyspnea, nausea but no vomiting. No diarrhea reported. Feels tremulous.  Objective: Filed Vitals:   11/25/14 1943 11/26/14 0000 11/26/14 0453 11/26/14 0958  BP: 111/76  98/58 90/62  Pulse: 101 97 88 76  Temp: 98.2 F (36.8 C)  98.3 F (36.8 C) 98.6 F (37 C)  TempSrc: Oral  Oral Oral  Resp: 20  20 20   Height:      Weight:   80.695 kg (177 lb 14.4 oz)   SpO2: 92%  98% 96%    Intake/Output Summary (Last 24 hours) at 11/26/14 1624 Last data filed at 11/26/14 1622  Gross per 24 hour  Intake 1711.25 ml  Output   1575 ml  Net 136.25 ml   Filed Weights   11/25/14 1347 11/26/14 0453  Weight: 82.328 kg (181 lb 8 oz) 80.695 kg (177 lb 14.4 oz)     Exam:  General exam: Pleasant middle-aged male lying comfortably in bed. Respiratory system: Clear. No increased work of breathing. Cardiovascular system: S1 & S2 heard, RRR. No JVD, murmurs, gallops, clicks or pedal edema. Telemetry: Sinus rhythm. Gastrointestinal system: Abdomen is nondistended, soft and nontender. Normal bowel sounds heard. Central nervous system: Alert and oriented. No focal neurological deficits. Extremities: Symmetric 5 x 5 power.   Data Reviewed: Basic Metabolic Panel:  Recent Labs Lab 11/25/14 0910 11/25/14 1425 11/25/14 1856 11/26/14 0051 11/26/14 0725  NA 116* 120* 119*  --  120*  K 4.0 4.1  4.7  --  3.1*  CL 79* 84* 83*  --  86*  CO2 21* 21* 19*  --  23  GLUCOSE 80 75 88  --  107*  BUN <5* <5* 6  --  7  CREATININE 0.94 0.82 0.96  --  1.05  CALCIUM 10.1 9.2 9.0  --  8.7*  MG  --   --   --  2.0  --    Liver Function Tests: No results for input(s): AST, ALT, ALKPHOS, BILITOT,  PROT, ALBUMIN in the last 168 hours. No results for input(s): LIPASE, AMYLASE in the last 168 hours. No results for input(s): AMMONIA in the last 168 hours. CBC:  Recent Labs Lab 11/25/14 0910  WBC 8.6  HGB 11.9*  HCT 36.7*  MCV 79.1  PLT 388   Cardiac Enzymes:  Recent Labs Lab 11/25/14 0910 11/25/14 1425 11/25/14 1856 11/26/14 0051 11/26/14 0725  TROPONINI <0.03 <0.03 <0.03 <0.03 <0.03   BNP (last 3 results) No results for input(s): PROBNP in the last 8760 hours. CBG: No results for input(s): GLUCAP in the last 168 hours.  Recent Results (from the past 240 hour(s))  Urine culture     Status: None   Collection Time: 11/25/14  3:22 PM  Result Value Ref Range Status   Specimen Description URINE, RANDOM  Final   Special Requests NONE  Final   Culture 1,000 COLONIES/mL INSIGNIFICANT GROWTH  Final   Report Status 11/26/2014 FINAL  Final           Studies: Dg Chest 2 View  11/25/2014  CLINICAL DATA:  Chest pain. EXAM: CHEST - 2 VIEW COMPARISON:  07/16/2014 FINDINGS: The heart size and mediastinal contours are within normal limits. Atelectasis present at both lung bases, left greater than right. There is no evidence of pulmonary edema, consolidation, pneumothorax, nodule or pleural fluid. Stable old right-sided rib fractures. IMPRESSION: No active disease.  Bibasilar atelectasis. Electronically Signed   By: Irish Lack M.D.   On: 11/25/2014 09:54        Scheduled Meds: . aspirin EC  325 mg Oral q morning - 10a  . atorvastatin  40 mg Oral Daily  . buPROPion  300 mg Oral Daily  . enoxaparin (LOVENOX) injection  40 mg Subcutaneous Q24H  . feeding supplement  1 Container Oral TID BM  . folic acid  1 mg Oral Daily  . metoprolol tartrate  25 mg Oral QHS  . multivitamin with minerals  1 tablet Oral Daily  . nicotine  21 mg Transdermal Daily  . pantoprazole  40 mg Oral Q1200  . QUEtiapine  100 mg Oral QHS  . sodium chloride  3 mL Intravenous Q12H  . thiamine   100 mg Oral Daily   Continuous Infusions: . sodium chloride 75 mL/hr at 11/25/14 1511    Principal Problem:   Bipolar disorder (HCC) Active Problems:   HTN (hypertension)   Hyperlipidemia   Polysubstance abuse   Hyponatremia   Chronic diastolic heart failure (HCC)   Chest pain   Alcohol abuse   Dehydration   Coronary artery disease involving native coronary artery of native heart without angina pectoris   Hypochloremia   Nausea vomiting and diarrhea   Alcohol withdrawal seizure (HCC)    Time spent: 40 minutes    Rasheida Broden, MD, FACP, FHM. [facility] Pager [phone]  If 7PM-7AM, please contact night-coverage [email or web address] Password [email or web address] 11/26/2014, 4:24 PM    LOS: 1 day

## 2014-11-26 NOTE — Consult Note (Signed)
[facility] Consult   Reason for Consult:  alcohol abuse, bipolar disorder Referring Physician:  Dr.Honglagi Patient Identification: [name] MRN:  [id number] Principal Diagnosis: Bipolar disorder [facility]) Diagnosis:   Patient Active Problem List   Diagnosis Date Noted  . Hypochloremia [E87.8] 11/25/2014  . Nausea vomiting and diarrhea [R11.2, R19.7] 11/25/2014  . Alcohol withdrawal seizure ([place]) [B93.903, R56.9] 11/25/2014  . Abdominal pain [R10.9] 07/15/2014  . Coronary artery disease involving native coronary artery of native heart without angina pectoris [I25.10]   . Essential hypertension [I10]   . LVH (left ventricular hypertrophy) [I51.7]   . Alcohol abuse [F10.10] 06/13/2014  . Hypotension [I95.9] 06/13/2014  . AKI (acute kidney injury) ([place]) [N17.9] 06/13/2014  . Dehydration [E86.0]   . Acute kidney injury ([place]) [N17.9]   . Other specified hypotension [I95.89]   . Chronic diastolic heart failure ([place]) [I50.32] 05/22/2014  . Chest pain [R07.9] 05/22/2014  . Anxiety [F41.9] 09/19/2012  . Bipolar disorder ([place]) [F31.9] 09/19/2012  . Depression [F32.9] 09/19/2012  . Hyponatremia [E87.1] 09/19/2012  . Anemia [D64.9] 03/22/2012  . Alcohol dependence ([place]) [F10.20] 03/21/2012    Class: Chronic  . Chest pain at rest [R07.9] 03/20/2012  . Acute blood loss anemia [D62] 02/12/2012  . Pain due to hip joint prosthesis, recalled Depuy ASR [E09.23RA, Z96.649] 02/07/2012  . Polysubstance abuse [F19.10] 09/02/2011  . Broken internal left hip prosthesis ([place]) [T84.011A] 09/02/2011  . CAD (coronary artery disease) [I25.10] 09/27/2010  . HTN (hypertension) [I10] 09/27/2010  . Hyperlipidemia [E78.5] 09/27/2010    Total Time spent with patient: 1 hour  Subjective:   [name] is a 64 y.o. male patient admitted with alcohol abuse, bipolar disorder.  HPI:  [name] is a 64 y.o. male, disabled, lives alone presented with increased symptoms of depression, alcohol  abuse since his younger brother passed away 2023-01-22 a year ago. Patient present with stomach upset, and hyponatremia. Patient endorses of depressive symptoms like loss of interest no motivation staying himself longer periods. Patient also has a disturbance of sleep and appetite. Patient reported has been drinking alcohol since he has been 64 years old and has multiple detox treatments and rehabilitation especially at [facility] which helped him to stay sober one to 2 years. Patient reportedly compliant with his medication and denies any side effects. Patient has been diagnosed with bipolar disorder in the past and also receiving outpatient medication management from the [facility]. Patient denied current symptoms of suicidal ideation, homicidal ideation, intention or plans. Patient has no evidence of psychosis.  Past Psychiatric History: He has been diagnosed with bipolar disorder over 9 years and Alcohol abuse vs dependence. He received out patient medication management from [facility]  Risk to Self: Is patient at risk for suicide?: No Risk to Others:   Prior Inpatient Therapy:   Prior Outpatient Therapy:    Past Medical History:  Past Medical History  Diagnosis Date  . MVP (mitral valve prolapse)   . Anxiety   . Hypertension   . Hyperlipidemia   . Palpitations   . Bipolar disorder ([place])   . Depression   . Esophagitis   . Gastritis   . Insomnia   . Hypercholesteremia   . Coronary artery disease     a.  s/p PTCA to PL branch of RCA in 2007;  b.  Last LHC 04/2008:  LM with irregularities, LAD 50%, mid LAD 50%, proximal D1 80-90%, proximal OM1 30-40%, proximal OM2 50%, PLB branch 30-40%, site of prior angioplasty  in the RCA widely patent, EF 65%=>Med Rx;  c. Nuclear study a 12/2011: EF 71% and normal perfusion.    Marland Kitchen History of echocardiogram     Echo 08/16/09: Mild LVH, EF 55-60%, no normal wall motion, grade 1 diastolic dysfunction  . Anginal pain ([place])   . Pneumonia 2005  . GERD (gastroesophageal  reflux disease)   . Arthritis     "hands; especially in cold weather" (12/11/2013)  . Alcohol related seizure ([place])     "when I go off it"    Past Surgical History  Procedure Laterality Date  . Coronary angioplasty  2007    RCA  . Cardiac catheterization  04/2008    EF 65%  . Knee arthroscopy, medial patello femoral ligament repair    . Hip arthroplasty Bilateral     "both replaced twice"   . Total shoulder replacement Right   . Total hip revision  02/07/2012    Procedure: TOTAL HIP REVISION;  Surgeon: Kerin Salen, MD;  Location: [place];  Service: Orthopedics;  Laterality: Left;  [place] Hip Revision Set  . Total knee arthroplasty Right   . Appendectomy  1958  . Tonsillectomy  1958  . Joint replacement     Family History:  Family History  Problem Relation Age of Onset  . Heart disease Neg Hx   . Prostate cancer Father    Family Psychiatric  History: He had family history of bipolar disorder in his mother and substance abuse in both father and brother. He has older brother who lives in [place], [place].  Social History:  History  Alcohol Use  . 0.0 oz/week    Comment: patient drinks 2-3 times a week     History  Drug Use  . 7.00 per week  . Special: Benzodiazepines    Comment: Percocet, Xanax    Social History   Social History  . Marital Status: Single    Spouse Name: N/A  . Number of Children: N/A  . Years of Education: N/A   Occupational History  . Retired Optometrist    Social History Main Topics  . Smoking status: Current Some Day Smoker -- 0.50 packs/day for 40 years    Types: Cigarettes  . Smokeless tobacco: Never Used  . Alcohol Use: 0.0 oz/week     Comment: patient drinks 2-3 times a week  . Drug Use: 7.00 per week    Special: Benzodiazepines     Comment: Percocet, Xanax  . Sexual Activity: No   Other Topics Concern  . None   Social History Narrative   Lives alone   Additional Social History: he is retired Engineer, maintenance (IT) and lives alone and reportedly gay  and has no relationship over five years.                           Allergies:   Allergies  Allergen Reactions  . Ambien [Zolpidem Tartrate] Other (See Comments)    Sleep Walking  . Codeine Itching    Labs:  Results for orders placed or performed during the hospital encounter of 11/25/14 (from the past 48 hour(s))  Basic metabolic panel     Status: Abnormal   Collection Time: 11/25/14  9:10 AM  Result Value Ref Range   Sodium 116 (LL) 135 - 145 mmol/L    Comment: CRITICAL RESULT CALLED TO, READ BACK BY AND VERIFIED WITH: MOORE B RN 11/25/14 1000 COSTELLO B    Potassium 4.0 3.5 - 5.1  mmol/L   Chloride 79 (L) 101 - 111 mmol/L   CO2 21 (L) 22 - 32 mmol/L   Glucose, Bld 80 65 - 99 mg/dL   BUN <5 (L) 6 - 20 mg/dL   Creatinine, Ser 0.94 0.61 - 1.24 mg/dL   Calcium 10.1 8.9 - 10.3 mg/dL   GFR calc non Af Amer >60 >60 mL/min   GFR calc Af Amer >60 >60 mL/min    Comment: (NOTE) The eGFR has been calculated using the CKD EPI equation. This calculation has not been validated in all clinical situations. eGFR's persistently <60 mL/min signify possible Chronic Kidney Disease.    Anion gap 16 (H) 5 - 15  CBC     Status: Abnormal   Collection Time: 11/25/14  9:10 AM  Result Value Ref Range   WBC 8.6 4.0 - 10.5 K/uL   RBC 4.64 4.22 - 5.81 MIL/uL   Hemoglobin 11.9 (L) 13.0 - 17.0 g/dL   HCT 36.7 (L) 39.0 - 52.0 %   MCV 79.1 78.0 - 100.0 fL   MCH 25.6 (L) 26.0 - 34.0 pg   MCHC 32.4 30.0 - 36.0 g/dL   RDW 17.3 (H) 11.5 - 15.5 %   Platelets 388 150 - 400 K/uL  Ethanol     Status: Abnormal   Collection Time: 11/25/14  9:10 AM  Result Value Ref Range   Alcohol, Ethyl (B) 87 (H) <5 mg/dL    Comment:        LOWEST DETECTABLE LIMIT FOR SERUM ALCOHOL IS 5 mg/dL FOR MEDICAL PURPOSES ONLY   Troponin I     Status: None   Collection Time: 11/25/14  9:10 AM  Result Value Ref Range   Troponin I <0.03 <0.031 ng/mL    Comment:        NO INDICATION OF MYOCARDIAL INJURY.    Osmolality     Status: Abnormal   Collection Time: 11/25/14 10:49 AM  Result Value Ref Range   Osmolality 259 (L) 275 - 300 mOsm/kg    Comment: Performed at [facility] panel     Status: Abnormal   Collection Time: 11/25/14  2:25 PM  Result Value Ref Range   Sodium 120 (L) 135 - 145 mmol/L   Potassium 4.1 3.5 - 5.1 mmol/L   Chloride 84 (L) 101 - 111 mmol/L   CO2 21 (L) 22 - 32 mmol/L   Glucose, Bld 75 65 - 99 mg/dL   BUN <5 (L) 6 - 20 mg/dL   Creatinine, Ser 0.82 0.61 - 1.24 mg/dL   Calcium 9.2 8.9 - 10.3 mg/dL   GFR calc non Af Amer >60 >60 mL/min   GFR calc Af Amer >60 >60 mL/min    Comment: (NOTE) The eGFR has been calculated using the CKD EPI equation. This calculation has not been validated in all clinical situations. eGFR's persistently <60 mL/min signify possible Chronic Kidney Disease.    Anion gap 15 5 - 15  Troponin I     Status: None   Collection Time: 11/25/14  2:25 PM  Result Value Ref Range   Troponin I <0.03 <0.031 ng/mL    Comment:        NO INDICATION OF MYOCARDIAL INJURY.   Urine rapid drug screen (hosp performed)     Status: Abnormal   Collection Time: 11/25/14  3:22 PM  Result Value Ref Range   Opiates NONE DETECTED NONE DETECTED   Cocaine NONE DETECTED NONE DETECTED   Benzodiazepines POSITIVE (A)  NONE DETECTED   Amphetamines NONE DETECTED NONE DETECTED   Tetrahydrocannabinol NONE DETECTED NONE DETECTED   Barbiturates NONE DETECTED NONE DETECTED    Comment:        DRUG SCREEN FOR MEDICAL PURPOSES ONLY.  IF CONFIRMATION IS NEEDED FOR ANY PURPOSE, NOTIFY LAB WITHIN 5 DAYS.        LOWEST DETECTABLE LIMITS FOR URINE DRUG SCREEN Drug Class       Cutoff (ng/mL) Amphetamine      1000 Barbiturate      200 Benzodiazepine   858 Tricyclics       850 Opiates          300 Cocaine          300 THC              50   Urine culture     Status: None (Preliminary result)   Collection Time: 11/25/14  3:22 PM  Result Value Ref  Range   Specimen Description URINE, RANDOM    Special Requests NONE    Culture NO GROWTH < 24 HOURS    Report Status PENDING   Urinalysis, Routine w reflex microscopic (not at [facility])     Status: Abnormal   Collection Time: 11/25/14  3:23 PM  Result Value Ref Range   Color, Urine YELLOW YELLOW   APPearance CLEAR CLEAR   Specific Gravity, Urine 1.012 1.005 - 1.030   pH 6.0 5.0 - 8.0   Glucose, UA NEGATIVE NEGATIVE mg/dL   Hgb urine dipstick NEGATIVE NEGATIVE   Bilirubin Urine NEGATIVE NEGATIVE   Ketones, ur 15 (A) NEGATIVE mg/dL   Protein, ur NEGATIVE NEGATIVE mg/dL   Urobilinogen, UA 0.2 0.0 - 1.0 mg/dL   Nitrite NEGATIVE NEGATIVE   Leukocytes, UA NEGATIVE NEGATIVE    Comment: MICROSCOPIC NOT DONE ON URINES WITH NEGATIVE PROTEIN, BLOOD, LEUKOCYTES, NITRITE, OR GLUCOSE <1000 mg/dL.  Sodium, urine, random     Status: None   Collection Time: 11/25/14  3:23 PM  Result Value Ref Range   Sodium, Ur 110 mmol/L  Osmolality, urine     Status: None   Collection Time: 11/25/14  3:23 PM  Result Value Ref Range   Osmolality, Ur 432 390 - 1090 mOsm/kg    Comment: Performed at [facility]  Troponin I     Status: None   Collection Time: 11/25/14  6:56 PM  Result Value Ref Range   Troponin I <0.03 <0.031 ng/mL    Comment:        NO INDICATION OF MYOCARDIAL INJURY.   Basic metabolic panel     Status: Abnormal   Collection Time: 11/25/14  6:56 PM  Result Value Ref Range   Sodium 119 (LL) 135 - 145 mmol/L    Comment: CRITICAL RESULT CALLED TO, READ BACK BY AND VERIFIED WITH: E OFORI,RN 1947 11/25/14 D BRADLEY    Potassium 4.7 3.5 - 5.1 mmol/L   Chloride 83 (L) 101 - 111 mmol/L   CO2 19 (L) 22 - 32 mmol/L   Glucose, Bld 88 65 - 99 mg/dL   BUN 6 6 - 20 mg/dL   Creatinine, Ser 0.96 0.61 - 1.24 mg/dL   Calcium 9.0 8.9 - 10.3 mg/dL   GFR calc non Af Amer >60 >60 mL/min   GFR calc Af Amer >60 >60 mL/min    Comment: (NOTE) The eGFR has been calculated using the CKD EPI  equation. This calculation has not been validated in all clinical situations. eGFR's persistently <60 mL/min  signify possible Chronic Kidney Disease.    Anion gap 17 (H) 5 - 15  Troponin I     Status: None   Collection Time: 11/26/14 12:51 AM  Result Value Ref Range   Troponin I <0.03 <0.031 ng/mL    Comment:        NO INDICATION OF MYOCARDIAL INJURY.   Magnesium     Status: None   Collection Time: 11/26/14 12:51 AM  Result Value Ref Range   Magnesium 2.0 1.7 - 2.4 mg/dL  Basic metabolic panel     Status: Abnormal   Collection Time: 11/26/14  7:25 AM  Result Value Ref Range   Sodium 120 (L) 135 - 145 mmol/L   Potassium 3.1 (L) 3.5 - 5.1 mmol/L   Chloride 86 (L) 101 - 111 mmol/L   CO2 23 22 - 32 mmol/L   Glucose, Bld 107 (H) 65 - 99 mg/dL   BUN 7 6 - 20 mg/dL   Creatinine, Ser 1.05 0.61 - 1.24 mg/dL   Calcium 8.7 (L) 8.9 - 10.3 mg/dL   GFR calc non Af Amer >60 >60 mL/min   GFR calc Af Amer >60 >60 mL/min    Comment: (NOTE) The eGFR has been calculated using the CKD EPI equation. This calculation has not been validated in all clinical situations. eGFR's persistently <60 mL/min signify possible Chronic Kidney Disease.    Anion gap 11 5 - 15  Troponin I (q 6hr x 3)     Status: None   Collection Time: 11/26/14  7:25 AM  Result Value Ref Range   Troponin I <0.03 <0.031 ng/mL    Comment:        NO INDICATION OF MYOCARDIAL INJURY.     Current Facility-Administered Medications  Medication Dose Route Frequency Provider Last Rate Last Dose  . 0.9 %  sodium chloride infusion   Intravenous Continuous Melton Alar, PA-C 75 mL/hr at 11/25/14 1511    . acetaminophen (TYLENOL) tablet 650 mg  650 mg Oral Q6H PRN Melton Alar, PA-C       Or  . acetaminophen (TYLENOL) suppository 650 mg  650 mg Rectal Q6H PRN Melton Alar, PA-C      . ALPRAZolam Duanne Moron) tablet 0.25 mg  0.25 mg Oral TID PRN Melton Alar, PA-C   0.25 mg at 11/26/14 0448  . alum & mag hydroxide-simeth  (MAALOX/MYLANTA) 200-200-20 MG/5ML suspension 30 mL  30 mL Oral Q6H PRN Melton Alar, PA-C   30 mL at 11/25/14 1450  . aspirin EC tablet 325 mg  325 mg Oral q morning - 10a Melton Alar, PA-C   325 mg at 11/26/14 1050  . atorvastatin (LIPITOR) tablet 40 mg  40 mg Oral Daily Melton Alar, PA-C   40 mg at 11/26/14 1050  . buPROPion (WELLBUTRIN XL) 24 hr tablet 300 mg  300 mg Oral Daily Melton Alar, PA-C   300 mg at 11/25/14 1500  . diphenhydrAMINE (BENADRYL) capsule 25 mg  25 mg Oral Q6H PRN Melton Alar, PA-C      . enoxaparin (LOVENOX) injection 40 mg  40 mg Subcutaneous Q24H Melton Alar, PA-C   40 mg at 11/26/14 1307  . feeding supplement (BOOST / RESOURCE BREEZE) liquid 1 Container  1 Container Oral TID BM Waldemar Dickens, MD   1 Container at 11/26/14 1400  . folic acid (FOLVITE) tablet 1 mg  1 mg Oral Daily Marianne L York, PA-C   1 mg  at 11/26/14 1051  . gi cocktail (Maalox,Lidocaine,Donnatal)  30 mL Oral BID PRN Melton Alar, PA-C      . LORazepam (ATIVAN) tablet 1 mg  1 mg Oral Q6H PRN Melton Alar, PA-C   1 mg at 11/26/14 1201   Or  . LORazepam (ATIVAN) injection 1 mg  1 mg Intravenous Q6H PRN Melton Alar, PA-C      . metoCLOPramide (REGLAN) injection 10 mg  10 mg Intravenous Q6H PRN Melton Alar, PA-C      . metoprolol tartrate (LOPRESSOR) tablet 25 mg  25 mg Oral QHS Melton Alar, PA-C   25 mg at 11/25/14 2113  . morphine 2 MG/ML injection 2 mg  2 mg Intravenous Q4H PRN Melton Alar, PA-C   2 mg at 11/25/14 2225  . multivitamin with minerals tablet 1 tablet  1 tablet Oral Daily Melton Alar, PA-C   1 tablet at 11/26/14 1050  . nicotine (NICODERM CQ - dosed in mg/24 hours) patch 21 mg  21 mg Transdermal Daily Waldemar Dickens, MD   21 mg at 11/26/14 1051  . nitroGLYCERIN (NITROSTAT) SL tablet 0.4 mg  0.4 mg Sublingual Q5 min PRN Melton Alar, PA-C   0.4 mg at 11/25/14 2017  . ondansetron (ZOFRAN) tablet 4 mg  4 mg Oral Q6H PRN Melton Alar,  PA-C   4 mg at 11/26/14 1122   Or  . ondansetron (ZOFRAN) injection 4 mg  4 mg Intravenous Q6H PRN Melton Alar, PA-C   4 mg at 11/25/14 1349  . oxyCODONE (Oxy IR/ROXICODONE) immediate release tablet 5 mg  5 mg Oral Q6H PRN Waldemar Dickens, MD   5 mg at 11/26/14 0219  . pantoprazole (PROTONIX) EC tablet 40 mg  40 mg Oral Q1200 Melton Alar, PA-C   40 mg at 11/26/14 1201  . QUEtiapine (SEROQUEL XR) 24 hr tablet 100 mg  100 mg Oral QHS Melton Alar, PA-C   100 mg at 11/25/14 2224  . sodium chloride 0.9 % injection 3 mL  3 mL Intravenous Q12H Melton Alar, PA-C   3 mL at 11/25/14 2115  . thiamine (VITAMIN B-1) tablet 100 mg  100 mg Oral Daily Melton Alar, PA-C   100 mg at 11/26/14 1051    Musculoskeletal: Strength & Muscle Tone: decreased Gait & Station: unable to stand Patient leans: N/A  Psychiatric Specialty Exam: ROS generalized weakness, anxiety, depression and recent stomach upset No Fever-chills, No Headache, No changes with Vision or hearing, reports vertigo No problems swallowing food or Liquids, No Chest pain, Cough or Shortness of Breath, No Abdominal pain, No Nausea or Vommitting, Bowel movements are regular, No Blood in stool or Urine, No dysuria, No new skin rashes or bruises, No new joints pains-aches,  No new weakness, tingling, numbness in any extremity, No recent weight gain or loss, No polyuria, polydypsia or polyphagia,   A full 10 point Review of Systems was done, except as stated above, all other Review of Systems were negative.  Blood pressure 90/62, pulse 76, temperature 98.6 F (37 C), temperature source Oral, resp. rate 20, height _0  (1.778 m), weight 80.695 kg (177 lb 14.4 oz), SpO2 96 %.Body mass index is 25.53 kg/(m^2).  General Appearance: Casual  Eye Contact::  Good  Speech:  Clear and Coherent  Volume:  Decreased  Mood:  Depressed  Affect:  Appropriate and Congruent  Thought Process:  Coherent and Goal Directed  Orientation:   Full (Time, Place, and Person)  Thought Content:  Rumination  Suicidal Thoughts:  No  Homicidal Thoughts:  No  Memory:  Immediate;   Good Recent;   Good  Judgement:  Intact  Insight:  Fair  Psychomotor Activity:  Decreased  Concentration:  Good  Recall:  Good  Fund of Knowledge:Good  Language: Good  Akathisia:  Negative  Handed:  Right  AIMS (if indicated):     Assets:  Communication Skills Desire for Improvement Financial Resources/Insurance Housing Leisure Time Resilience Social Support Talents/Skills Transportation  ADL's:  Intact  Cognition: WNL  Sleep:      Treatment Plan Summary: Daily contact with patient to assess and evaluate symptoms and progress in treatment and Medication management  Grief counseling at Hospice care and medication management at [facility] when medically stable Continue Wellbutrin and Seroquel and may change to Effexor if hyponatremia does not resolve Appreciate psychiatric consultation and follow up as clinically required Please contact [phone] or [phone] if needs further assistance  Disposition:  Patient does not meet criteria for psychiatric inpatient admission. Supportive therapy provided about ongoing stressors.  Derra Shartzer,JANARDHAHA R. 11/26/2014 1:46 PM

## 2014-11-27 LAB — CBC
HCT: 29.5 % — ABNORMAL LOW (ref 39.0–52.0)
HEMOGLOBIN: 9.8 g/dL — AB (ref 13.0–17.0)
MCH: 27 pg (ref 26.0–34.0)
MCHC: 33.2 g/dL (ref 30.0–36.0)
MCV: 81.3 fL (ref 78.0–100.0)
PLATELETS: 243 10*3/uL (ref 150–400)
RBC: 3.63 MIL/uL — AB (ref 4.22–5.81)
RDW: 17.5 % — ABNORMAL HIGH (ref 11.5–15.5)
WBC: 4.5 10*3/uL (ref 4.0–10.5)

## 2014-11-27 LAB — BASIC METABOLIC PANEL
Anion gap: 8 (ref 5–15)
BUN: <5 mg/dL — ABNORMAL LOW (ref 6–20)
CALCIUM: 8.5 mg/dL — AB (ref 8.9–10.3)
CO2: 26 mmol/L (ref 22–32)
CREATININE: 1.01 mg/dL (ref 0.61–1.24)
Chloride: 92 mmol/L — ABNORMAL LOW (ref 101–111)
Glucose, Bld: 93 mg/dL (ref 65–99)
Potassium: 3.4 mmol/L — ABNORMAL LOW (ref 3.5–5.1)
SODIUM: 126 mmol/L — AB (ref 135–145)

## 2014-11-27 MED ORDER — POTASSIUM CHLORIDE IN NACL 40-0.9 MEQ/L-% IV SOLN
INTRAVENOUS | Status: DC
  Administered 2014-11-27 – 2014-11-28 (×2): 50 mL/h via INTRAVENOUS
  Filled 2014-11-27 (×4): qty 1000

## 2014-11-27 NOTE — Care Management Important Message (Signed)
Important Message  Patient Details  Name: [name] MRN: [id number] Date of Birth: Mar 28, 1950   Medicare Important Message Given:  Yes-second notification given    Orson AloeMegan P Shavaughn Seidl 11/27/2014, 1:25 PM

## 2014-11-27 NOTE — Progress Notes (Addendum)
PROGRESS NOTE    [name] ZOX:[id number] DOB: [birth year] DOA: 11/25/2014 PCP: Thayer Headings, MD  HPI/Brief narrative 64 year old male with history of MVP, HTN, HLD, anxiety/depression/bipolar disorder, CAD, gastritis, esophagitis, alcohol dependence and tobacco abuse, presented to [facility] ED on 11/25/14 with complaints of nausea, nonbloody emesis, diarrhea and chest pain. Does not eat or drink well when he is binging on alcohol. ER work up shows Na of 116, chloride 79, troponin 0.03, ethyl alcohol 87. CXR is negative for source of pain, EKG does not show ST changes.   Assessment/Plan:  Hyponatremia - Baseline sodium: 124-130. Admitted with sodium of 116 - Multifactorial secondary to dehydration from GI losses, beer potomania &? SIADH. - Sodium has improved from 116 to 126, target is 130 - Continue gentle IV normal saline and aim to correct by no more than 8 mEq in 24 hours. Add potassium to IV fluids  Atypical chest pain - Likely GI in origin versus musculoskeletal from multiple emesis. - EKG without acute changes. Troponin 5 negative. - PPI and supportive treatment.  Nausea, vomiting and diarrhea - DD: Acute viral GE versus alcohol abuse related versus alcohol withdrawal. - Much improved. Only nausea at this time. PPI and diet as tolerated.  Alcohol dependence - States that he has been having intermittent binge drinking since his younger brother died 2 weeks before Christmas of 2015. - Apparently drinks 12 pack of beers up to 3-5 days a week. Denies hard liquor. - Moderation/cessation counseled. Continue CIWA protocol. Anticipate that the patient will show signs of withdrawal today  Depression/anxiety/bipolar disorder - Denies suicidal or homicidal ideations. Psychiatry consultation completed on 11/3 Patient refuses to see the same psychiatrist again  Essential hypertension - Controlled.  Tobacco abuse - Cessation counseled  CAD - Management as above. Continue  aspirin, metoprolol and statins.  Hypokalemia - Replace and follow. Magnesium 2.  Anemia - Follow CBCs.   DVT prophylaxis: Lovenox Code Status: Full Family Communication: None at bedside Disposition Plan: anticipated the patient will start withdrawing today, patient is concerned about seizures during his withdrawal, therefore continue to monitor   Consultants:  Psychiatry  Procedures:  None  Antibiotics:  None   Subjective:  . Feels tremulous.upset with psychiatry, does not want to see him again  Objective: Filed Vitals:   11/26/14 2030 11/26/14 2340 11/27/14 0435 11/27/14 1217  BP: 106/71 100/65 90/67 98/78   Pulse: 98 79 76 104  Temp: 98.4 F (36.9 C) 98.8 F (37.1 C) 98 F (36.7 C) 99.1 F (37.3 C)  TempSrc: Oral Oral Oral Axillary  Resp: Height:      Weight:   81.285 kg (179 lb 3.2 oz)   SpO2: 100% 98% 95% 96%    Intake/Output Summary (Last 24 hours) at 11/27/14 1232 Last data filed at 11/27/14 0956  Gross per 24 hour  Intake 2353.75 ml  Output   2850 ml  Net -496.25 ml   Filed Weights   11/25/14 1347 11/26/14 0453 11/27/14 0435  Weight: 82.328 kg (181 lb 8 oz) 80.695 kg (177 lb 14.4 oz) 81.285 kg (179 lb 3.2 oz)     Exam:  General exam: Pleasant middle-aged male lying comfortably in bed. Respiratory system: Clear. No increased work of breathing. Cardiovascular system: S1 & S2 heard, RRR. No JVD, murmurs, gallops, clicks or pedal edema. Telemetry: Sinus rhythm. Gastrointestinal system: Abdomen is nondistended, soft and nontender. Normal bowel sounds heard. Central nervous system: Alert and oriented. No focal neurological deficits. Extremities: Symmetric  5 x 5 power.   Data Reviewed: Basic Metabolic Panel:  Recent Labs Lab 11/25/14 1425 11/25/14 1856 11/26/14 0051 11/26/14 0725 11/26/14 1710 11/27/14 0529  NA 120* 119*  --  120* 123* 126*  K 4.1 4.7  --  3.1* 3.5 3.4*  CL 84* 83*  --  86* 87* 92*  CO2 21* 19*  --  23  22 26   GLUCOSE 75 88  --  107* 114* 93  BUN <5* 6  --  7 <5* <5*  CREATININE 0.82 0.96  --  1.05 0.95 1.01  CALCIUM 9.2 9.0  --  8.7* 9.1 8.5*  MG  --   --  2.0  --   --   --    Liver Function Tests: No results for input(s): AST, ALT, ALKPHOS, BILITOT, PROT, ALBUMIN in the last 168 hours. No results for input(s): LIPASE, AMYLASE in the last 168 hours. No results for input(s): AMMONIA in the last 168 hours. CBC:  Recent Labs Lab 11/25/14 0910 11/27/14 0529  WBC 8.6 4.5  HGB 11.9* 9.8*  HCT 36.7* 29.5*  MCV 79.1 81.3  PLT 388 243   Cardiac Enzymes:  Recent Labs Lab 11/25/14 0910 11/25/14 1425 11/25/14 1856 11/26/14 0051 11/26/14 0725  TROPONINI <0.03 <0.03 <0.03 <0.03 <0.03   BNP (last 3 results) No results for input(s): PROBNP in the last 8760 hours. CBG: No results for input(s): GLUCAP in the last 168 hours.  Recent Results (from the past 240 hour(s))  Urine culture     Status: None   Collection Time: 11/25/14  3:22 PM  Result Value Ref Range Status   Specimen Description URINE, RANDOM  Final   Special Requests NONE  Final   Culture 1,000 COLONIES/mL INSIGNIFICANT GROWTH  Final   Report Status 11/26/2014 FINAL  Final           Studies: No results found.      Scheduled Meds: . aspirin EC  325 mg Oral q morning - 10a  . atorvastatin  40 mg Oral Daily  . buPROPion  300 mg Oral Daily  . enoxaparin (LOVENOX) injection  40 mg Subcutaneous Q24H  . feeding supplement  1 Container Oral TID BM  . folic acid  1 mg Oral Daily  . metoprolol tartrate  25 mg Oral QHS  . multivitamin with minerals  1 tablet Oral Daily  . nicotine  21 mg Transdermal Daily  . pantoprazole  40 mg Oral BID AC  . QUEtiapine  100 mg Oral QHS  . sodium chloride  3 mL Intravenous Q12H  . thiamine  100 mg Oral Daily   Continuous Infusions:    Principal Problem:   Bipolar disorder (HCC) Active Problems:   HTN (hypertension)   Hyperlipidemia   Polysubstance abuse    Hyponatremia   Chronic diastolic heart failure (HCC)   Chest pain   Alcohol abuse   Dehydration   Coronary artery disease involving native coronary artery of native heart without angina pectoris   Hypochloremia   Nausea vomiting and diarrhea   Alcohol withdrawal seizure (HCC)    Time spent: 40 minutes    [name],[name] Lips, MD,   [facility] Pager [phone]  If 7PM-7AM, please contact night-coverage [email or web address] Password [email or web address] 11/27/2014, 12:32 PM    LOS: 2 days

## 2014-11-28 DIAGNOSIS — R0789 Other chest pain: Secondary | ICD-10-CM

## 2014-11-28 LAB — CBC
HEMATOCRIT: 30.2 % — AB (ref 39.0–52.0)
HEMOGLOBIN: 9.5 g/dL — AB (ref 13.0–17.0)
MCH: 26 pg (ref 26.0–34.0)
MCHC: 31.5 g/dL (ref 30.0–36.0)
MCV: 82.7 fL (ref 78.0–100.0)
Platelets: 276 10*3/uL (ref 150–400)
RBC: 3.65 MIL/uL — AB (ref 4.22–5.81)
RDW: 17.6 % — ABNORMAL HIGH (ref 11.5–15.5)
WBC: 4.9 10*3/uL (ref 4.0–10.5)

## 2014-11-28 LAB — COMPREHENSIVE METABOLIC PANEL
ALK PHOS: 57 U/L (ref 38–126)
ALT: 18 U/L (ref 17–63)
AST: 26 U/L (ref 15–41)
Albumin: 2.7 g/dL — ABNORMAL LOW (ref 3.5–5.0)
Anion gap: 7 (ref 5–15)
BILIRUBIN TOTAL: 0.5 mg/dL (ref 0.3–1.2)
BUN: 9 mg/dL (ref 6–20)
CO2: 22 mmol/L (ref 22–32)
CREATININE: 1.04 mg/dL (ref 0.61–1.24)
Calcium: 8.4 mg/dL — ABNORMAL LOW (ref 8.9–10.3)
Chloride: 101 mmol/L (ref 101–111)
GFR calc non Af Amer: >60 mL/min (ref >60)
Glucose, Bld: 105 mg/dL — ABNORMAL HIGH (ref 65–99)
Potassium: 3.4 mmol/L — ABNORMAL LOW (ref 3.5–5.1)
SODIUM: 130 mmol/L — AB (ref 135–145)
Total Protein: 5.1 g/dL — ABNORMAL LOW (ref 6.5–8.1)

## 2014-11-28 LAB — MAGNESIUM: Magnesium: 2 mg/dL (ref 1.7–2.4)

## 2014-11-28 MED ORDER — NICOTINE 21 MG/24HR TD PT24: 21.0000 mg | MEDICATED_PATCH | Freq: Every day | TRANSDERMAL | Status: AC

## 2014-11-28 MED ORDER — THIAMINE HCL 100 MG PO TABS: 100.0000 mg | ORAL_TABLET | Freq: Every day | ORAL | Status: DC

## 2014-11-28 MED ORDER — ACETAMINOPHEN 325 MG PO TABS: 650.0000 mg | ORAL_TABLET | Freq: Four times a day (QID) | ORAL | Status: AC | PRN

## 2014-11-28 MED ORDER — PANTOPRAZOLE SODIUM 40 MG PO TBEC: 40.0000 mg | DELAYED_RELEASE_TABLET | Freq: Every day | ORAL | Status: AC

## 2014-11-28 MED ORDER — FOLIC ACID 1 MG PO TABS: 1.0000 mg | ORAL_TABLET | Freq: Every day | ORAL | Status: AC

## 2014-11-28 MED ORDER — ADULT MULTIVITAMIN W/MINERALS CH: 1.0000 | ORAL_TABLET | Freq: Every day | ORAL | Status: AC

## 2014-11-28 MED ORDER — POTASSIUM CHLORIDE CRYS ER 20 MEQ PO TBCR
40.0000 meq | EXTENDED_RELEASE_TABLET | Freq: Once | ORAL | Status: AC
Start: 2014-11-28 — End: 2014-11-28
  Administered 2014-11-28: 40 meq via ORAL

## 2014-11-28 NOTE — Discharge Summary (Signed)
Physician Discharge Summary  [name] ZOX:[id number] DOB: 09/13/1950 DOA: 11/25/2014  PCP: Thayer Headings, MD  Admit date: 11/25/2014 Discharge date: 11/28/2014  Time spent: Greater than 30 minutes  Recommendations for Outpatient Follow-up:  1. Dr. Thayer Headings, PCP in 5 days with repeat labs (CBC & CMP). 2. [facility]: Patient to continue to follow up as before.  Discharge Diagnoses:  Principal Problem:   Bipolar disorder (HCC) Active Problems:   HTN (hypertension)   Hyperlipidemia   Polysubstance abuse   Hyponatremia   Chronic diastolic heart failure (HCC)   Chest pain   Alcohol abuse   Dehydration   Coronary artery disease involving native coronary artery of native heart without angina pectoris   Hypochloremia   Nausea vomiting and diarrhea   Alcohol withdrawal seizure (HCC)   Discharge Condition: Improved & Stable  Diet recommendation: Heart healthy diet.  Filed Weights   11/26/14 0453 11/27/14 0435 11/28/14 0453  Weight: 80.695 kg (177 lb 14.4 oz) 81.285 kg (179 lb 3.2 oz) 81.647 kg (180 lb)    History of present illness:  64 year old male with history of MVP, HTN, HLD, anxiety/depression/bipolar disorder, CAD, gastritis, esophagitis, alcohol dependence and tobacco abuse, presented to [facility] ED on 11/25/14 with complaints of nausea, nonbloody emesis, diarrhea and chest pain. Does not eat or drink well when he is binging on alcohol. ER work up shows Na of 116, chloride 79, troponin 0.03, ethyl alcohol 87. CXR is negative for source of pain, EKG does not show ST changes.  Hospital Course:   Hyponatremia - Baseline sodium: 124-130. Admitted with sodium of 116 - Multifactorial secondary to dehydration from GI losses, beer potomania &? SIADH. - Sodium has gradually improved to 130  Atypical chest pain - Likely GI in origin versus musculoskeletal from multiple emesis. - EKG without acute changes. Troponin 5 negative. - PPI and supportive treatment. -  Resolved.  Nausea, vomiting and diarrhea - DD: Acute viral GE versus alcohol abuse related versus alcohol withdrawal. - Resolved. Continue daily PPI for possible gastritis related to alcohol abuse.  Alcohol dependence - States that he has been having intermittent binge drinking since his younger brother died 2 weeks before Christmas of 2015. - Apparently drinks 12 pack of beers up to 3-5 days a week. Denies hard liquor. - Moderation/cessation counseled.  - No overt withdrawal. CIWA 0-3 over the last 24 hours  Depression/anxiety/bipolar disorder - Denies suicidal or homicidal ideations. Psychiatry consultation completed on 11/3 - Patient refuses to see the same psychiatrist again-not satisfied with contraction and indicates that he will follow up with [facility]  Essential hypertension - Controlled.  Tobacco abuse - Cessation counseled. Continue nicotine patch.   CAD - Management as above. Continue aspirin, metoprolol and statins.  Hypokalemia - Replaced prior to DC and follow OP. Magnesium 2.  Anemia - stable    Consultants:  Psychiatry  Procedures:  None  Antibiotics:  None  Discharge Exam:  Complaints: Feels much better. No complaints reported. No chest pain, abdominal pain reported. As per nursing, no acute issues. PT eval 11/3 appreciated-no PT follow-up recommended.   Filed Vitals:   11/27/14 1217 11/27/14 2229 11/28/14 0453 11/28/14 0747  BP: 98/78 123/80 98/62 134/84  Pulse: 104 97 109 89  Temp: 99.1 F (37.3 C) 98.3 F (36.8 C) 98.5 F (36.9 C) 98.1 F (36.7 C)  TempSrc: Axillary Oral Oral Oral  Resp: 18 20 18 18   Height:      Weight:   81.647 kg (180 lb)  SpO2: 96% 100% 96% 94%    General exam: Pleasant middle-aged male seen ambulating steadily in the room . Respiratory system: Clear. No increased work of breathing. Cardiovascular system: S1 & S2 heard, RRR. No JVD, murmurs, gallops, clicks or pedal edema. Telemetry: Sinus rhythm with  first-degree AV block. Gastrointestinal system: Abdomen is nondistended, soft and nontender. Normal bowel sounds heard. Central nervous system: Alert and oriented. No focal neurological deficits. Extremities: Symmetric 5 x 5 power.  Discharge Instructions      Discharge Instructions    Call MD for:  difficulty breathing, headache or visual disturbances    Complete by:  As directed      Call MD for:  extreme fatigue    Complete by:  As directed      Call MD for:  hives    Complete by:  As directed      Call MD for:  persistant dizziness or light-headedness    Complete by:  As directed      Call MD for:  persistant nausea and vomiting    Complete by:  As directed      Call MD for:  severe uncontrolled pain    Complete by:  As directed      Call MD for:  temperature >100.4    Complete by:  As directed      Diet - low sodium heart healthy    Complete by:  As directed      Increase activity slowly    Complete by:  As directed             Medication List    STOP taking these medications        ibuprofen 200 MG tablet  Commonly known as:  ADVIL,MOTRIN      TAKE these medications        acetaminophen 325 MG tablet  Commonly known as:  TYLENOL  Take 2 tablets (650 mg total) by mouth every 6 (six) hours as needed for mild pain, moderate pain, fever or headache (or Fever >/= 101).     ALPRAZolam 0.5 MG tablet  Commonly known as:  XANAX  Take 0.25-0.5 tablets by mouth 3 (three) times daily as needed.     aspirin EC 325 MG tablet  Take 1 tablet (325 mg total) by mouth every morning. For blood thinner for heart health     atorvastatin 40 MG tablet  Commonly known as:  LIPITOR  Take 1 tablet (40 mg total) by mouth daily. For cholesterol     buPROPion 300 MG 24 hr tablet  Commonly known as:  WELLBUTRIN XL  Take 1 tablet (300 mg total) by mouth daily. For depression     folic acid 1 MG tablet  Commonly known as:  FOLVITE  Take 1 tablet (1 mg total) by mouth daily.      metoprolol tartrate 25 MG tablet  Commonly known as:  LOPRESSOR  Take 25 mg by mouth at bedtime.     multivitamin with minerals Tabs tablet  Take 1 tablet by mouth daily.     nicotine 21 mg/24hr patch  Commonly known as:  NICODERM CQ - dosed in mg/24 hours  Place 1 patch (21 mg total) onto the skin daily.     nitroGLYCERIN 0.4 MG SL tablet  Commonly known as:  NITROSTAT  Place 1 tablet (0.4 mg total) under the tongue every 5 (five) minutes as needed for chest pain (CP or SOB).     pantoprazole 40 MG  tablet  Commonly known as:  PROTONIX  Take 1 tablet (40 mg total) by mouth daily.     QUEtiapine 50 MG Tb24 24 hr tablet  Commonly known as:  SEROQUEL XR  Take 2 tablets (100 mg total) by mouth at bedtime.     thiamine 100 MG tablet  Take 1 tablet (100 mg total) by mouth daily.       Follow-up Information    Follow up with Thayer HeadingsMACKENZIE,BRIAN, MD. Schedule an appointment as soon as possible for a visit in 5 days.   Specialty:  Internal Medicine   Why:  To be seen with repeat labs (CBC & CMP).   Contact information:   [address], [address] [place] [id number] [phone]       Schedule an appointment as soon as possible for a [facility].       The results of significant diagnostics from this hospitalization (including imaging, microbiology, ancillary and laboratory) are listed below for reference.    Significant Diagnostic Studies: Dg Chest 2 View  11/25/2014  CLINICAL DATA:  Chest pain. EXAM: CHEST - 2 VIEW COMPARISON:  07/16/2014 FINDINGS: The heart size and mediastinal contours are within normal limits. Atelectasis present at both lung bases, left greater than right. There is no evidence of pulmonary edema, consolidation, pneumothorax, nodule or pleural fluid. Stable old right-sided rib fractures. IMPRESSION: No active disease.  Bibasilar atelectasis. Electronically Signed   By: Irish LackGlenn  Yamagata M.D.   On: 11/25/2014 09:54     Microbiology: Recent Results (from the past 240 hour(s))  Urine culture     Status: None   Collection Time: 11/25/14  3:22 PM  Result Value Ref Range Status   Specimen Description URINE, RANDOM  Final   Special Requests NONE  Final   Culture 1,000 COLONIES/mL INSIGNIFICANT GROWTH  Final   Report Status 11/26/2014 FINAL  Final     Labs: Basic Metabolic Panel:  Recent Labs Lab 11/25/14 1856 11/26/14 0051 11/26/14 0725 11/26/14 1710 11/27/14 0529 11/28/14 0342 11/28/14 0815  NA 119*  --  120* 123* 126* 130*  --   K 4.7  --  3.1* 3.5 3.4* 3.4*  --   CL 83*  --  86* 87* 92* 101  --   CO2 19*  --  23 22 26 22   --   GLUCOSE 88  --  107* 114* 93 105*  --   BUN 6  --  7 <5* <5* 9  --   CREATININE 0.96  --  1.05 0.95 1.01 1.04  --   CALCIUM 9.0  --  8.7* 9.1 8.5* 8.4*  --   MG  --  2.0  --   --   --   --  2.0   Liver Function Tests:  Recent Labs Lab 11/28/14 0342  AST 26  ALT 18  ALKPHOS 57  BILITOT 0.5  PROT 5.1*  ALBUMIN 2.7*   No results for input(s): LIPASE, AMYLASE in the last 168 hours. No results for input(s): AMMONIA in the last 168 hours. CBC:  Recent Labs Lab 11/25/14 0910 11/27/14 0529 11/28/14 0342  WBC 8.6 4.5 4.9  HGB 11.9* 9.8* 9.5*  HCT 36.7* 29.5* 30.2*  MCV 79.1 81.3 82.7  PLT 388 243 276   Cardiac Enzymes:  Recent Labs Lab 11/25/14 0910 11/25/14 1425 11/25/14 1856 11/26/14 0051 11/26/14 0725  TROPONINI <0.03 <0.03 <0.03 <0.03 <0.03   BNP: BNP (last 3 results)  Recent Labs  03/30/14 2346 05/22/14  1902 06/12/14 2338  BNP 64.6 61.5 75.8    ProBNP (last 3 results) No results for input(s): PROBNP in the last 8760 hours.  CBG: No results for input(s): GLUCAP in the last 168 hours.    Signed:  Marcellus Scott, MD, FACP, FHM. [facility] Pager [phone]  If 7PM-7AM, please contact night-coverage [email or web address] Password [email or web address] 11/28/2014, 11:56 AM

## 2014-11-28 NOTE — Discharge Instructions (Signed)
Hyponatremia Hyponatremia is when the amount of salt (sodium) in your blood is too low. When sodium levels are low, your cells absorb extra water and they swell. The swelling happens throughout the body, but it mostly affects the brain. CAUSES This condition may be caused by:  Heart, kidney, or liver problems.  Thyroid problems.  Adrenal gland problems.  Metabolic conditions, such as syndrome of inappropriate antidiuretic hormone (SIADH).  Severe vomiting and diarrhea.  Certain medicines or illegal drugs.  Dehydration.  Drinking too much water.  Eating a diet that is low in sodium.  Large burns on your body.  Sweating. RISK FACTORS This condition is more likely to develop in people who:  Have long-term (chronic) kidney disease.  Have heart failure.  Have a medical condition that causes frequent or excessive diarrhea.  Have metabolic conditions, such as Addison disease or SIADH.  Take certain medicines that affect the sodium and fluid balance in the blood. Some of these medicine types include:  Diuretics.  NSAIDs.  Some opioid pain medicines.  Some antidepressants.  Some seizure prevention medicines. SYMPTOMS  Symptoms of this condition include:  Nausea and vomiting.  Confusion.  Lethargy.  Agitation.  Headache.  Seizures.  Unconsciousness.  Appetite loss.  Muscle weakness and cramping.  Feeling weak or light-headed.  Having a rapid heart rate.  Fainting, in severe cases. DIAGNOSIS This condition is diagnosed with a medical history and physical exam. You will also have other tests, including:  Blood tests.  Urine tests. TREATMENT Treatment for this condition depends on the cause. Treatment may include:  Fluids given through an IV tube that is inserted into one of your veins.  Medicines to correct the sodium imbalance. If medicines are causing the condition, the medicines will need to be adjusted.  Limiting water or fluid intake to  get the correct sodium balance. HOME CARE INSTRUCTIONS  Take medicines only as directed by your health care provider. Many medicines can make this condition worse. Talk with your health care provider about any medicines that you are currently taking.  Carefully follow a recommended diet as directed by your health care provider.  Carefully follow instructions from your health care provider about fluid restrictions.  Keep all follow-up visits as directed by your health care provider. This is important.  Do not drink alcohol. SEEK MEDICAL CARE IF:  You develop worsening nausea, fatigue, headache, confusion, or weakness.  Your symptoms go away and then return.  You have problems following the recommended diet. SEEK IMMEDIATE MEDICAL CARE IF:  You have a seizure.  You faint.  You have ongoing diarrhea or vomiting.   This information is not intended to replace advice given to you by your health care provider. Make sure you discuss any questions you have with your health care provider.   Document Released: 12/30/2001 Document Revised: 05/26/2014 Document Reviewed: 01/29/2014 Elsevier Interactive Patient Education 2016 Elsevier Inc.  Chemical Dependency Chemical dependency is an addiction to drugs or alcohol. It is characterized by the repeated behavior of seeking out and using drugs and alcohol despite harmful consequences to the health and safety of ones self and others.  RISK FACTORS There are certain situations or behaviors that increase a person's risk for chemical dependency. These include:  A family history of chemical dependency.  A history of mental health issues, including depression and anxiety.  A home environment where drugs and alcohol are easily available to you.  Drug or alcohol use at a young age. SYMPTOMS  The following  symptoms can indicate chemical dependency:  Inability to limit the use of drugs or alcohol.  Nausea, sweating, shakiness, and anxiety that  occurs when alcohol or drugs are not being used.  An increase in amount of drugs or alcohol that is necessary to get drunk or high. People who experience these symptoms can assess their use of drugs and alcohol by asking themselves the following questions:  Have you been told by friends or family that they are worried about your use of alcohol or drugs?  Do friends and family ever tell you about things you did while drinking alcohol or using drugs that you do not remember?  Do you lie about using alcohol or drugs or about the amounts you use?  Do you have difficulty completing daily tasks unless you use alcohol or drugs?  Is the level of your work or school performance lower because of your drug or alcohol use?  Do you get sick from using drugs or alcohol but keep using anyway?  Do you feel uncomfortable in social situations unless you use alcohol or drugs?  Do you use drugs or alcohol to help forget problems? An answer of yes to any of these questions may indicate chemical dependency. Professional evaluation is suggested.   This information is not intended to replace advice given to you by your health care provider. Make sure you discuss any questions you have with your health care provider.   Document Released: 01/03/2001 Document Revised: 04/03/2011 Document Reviewed: 03/17/2010 Elsevier Interactive Patient Education [employer] Inc.

## 2014-12-03 ENCOUNTER — Emergency Department (HOSPITAL_COMMUNITY): Payer: Medicare Other

## 2014-12-03 ENCOUNTER — Encounter (HOSPITAL_COMMUNITY): Payer: Self-pay | Admitting: Emergency Medicine

## 2014-12-03 ENCOUNTER — Emergency Department (HOSPITAL_COMMUNITY)
Admission: EM | Admit: 2014-12-03 | Discharge: 2014-12-03 | Disposition: A | Discharge status: Home / Self Care | Payer: Medicare Other | Attending: Physician Assistant | Admitting: Physician Assistant

## 2014-12-03 DIAGNOSIS — R197 Diarrhea, unspecified: Secondary | ICD-10-CM | POA: Diagnosis not present

## 2014-12-03 DIAGNOSIS — R1012 Left upper quadrant pain: Secondary | ICD-10-CM | POA: Insufficient documentation

## 2014-12-03 DIAGNOSIS — R1011 Right upper quadrant pain: Secondary | ICD-10-CM | POA: Insufficient documentation

## 2014-12-03 DIAGNOSIS — Z7982 Long term (current) use of aspirin: Secondary | ICD-10-CM | POA: Diagnosis not present

## 2014-12-03 DIAGNOSIS — M19042 Primary osteoarthritis, left hand: Secondary | ICD-10-CM | POA: Diagnosis not present

## 2014-12-03 DIAGNOSIS — K219 Gastro-esophageal reflux disease without esophagitis: Secondary | ICD-10-CM | POA: Insufficient documentation

## 2014-12-03 DIAGNOSIS — F419 Anxiety disorder, unspecified: Secondary | ICD-10-CM | POA: Insufficient documentation

## 2014-12-03 DIAGNOSIS — R42 Dizziness and giddiness: Secondary | ICD-10-CM | POA: Diagnosis not present

## 2014-12-03 DIAGNOSIS — M19041 Primary osteoarthritis, right hand: Secondary | ICD-10-CM | POA: Diagnosis not present

## 2014-12-03 DIAGNOSIS — F1010 Alcohol abuse, uncomplicated: Secondary | ICD-10-CM | POA: Insufficient documentation

## 2014-12-03 DIAGNOSIS — G4700 Insomnia, unspecified: Secondary | ICD-10-CM | POA: Diagnosis not present

## 2014-12-03 DIAGNOSIS — F1310 Sedative, hypnotic or anxiolytic abuse, uncomplicated: Secondary | ICD-10-CM | POA: Diagnosis not present

## 2014-12-03 DIAGNOSIS — E785 Hyperlipidemia, unspecified: Secondary | ICD-10-CM | POA: Insufficient documentation

## 2014-12-03 DIAGNOSIS — Z9049 Acquired absence of other specified parts of digestive tract: Secondary | ICD-10-CM | POA: Insufficient documentation

## 2014-12-03 DIAGNOSIS — I10 Essential (primary) hypertension: Secondary | ICD-10-CM | POA: Diagnosis not present

## 2014-12-03 DIAGNOSIS — R079 Chest pain, unspecified: Secondary | ICD-10-CM | POA: Insufficient documentation

## 2014-12-03 DIAGNOSIS — Z8701 Personal history of pneumonia (recurrent): Secondary | ICD-10-CM | POA: Diagnosis not present

## 2014-12-03 DIAGNOSIS — Z9889 Other specified postprocedural states: Secondary | ICD-10-CM | POA: Insufficient documentation

## 2014-12-03 DIAGNOSIS — Z9861 Coronary angioplasty status: Secondary | ICD-10-CM | POA: Diagnosis not present

## 2014-12-03 DIAGNOSIS — I25119 Atherosclerotic heart disease of native coronary artery with unspecified angina pectoris: Secondary | ICD-10-CM | POA: Insufficient documentation

## 2014-12-03 DIAGNOSIS — Z720 Tobacco use: Secondary | ICD-10-CM | POA: Diagnosis not present

## 2014-12-03 DIAGNOSIS — Z79899 Other long term (current) drug therapy: Secondary | ICD-10-CM | POA: Insufficient documentation

## 2014-12-03 DIAGNOSIS — R112 Nausea with vomiting, unspecified: Secondary | ICD-10-CM | POA: Insufficient documentation

## 2014-12-03 DIAGNOSIS — R51 Headache: Secondary | ICD-10-CM | POA: Diagnosis not present

## 2014-12-03 DIAGNOSIS — R109 Unspecified abdominal pain: Secondary | ICD-10-CM

## 2014-12-03 DIAGNOSIS — F319 Bipolar disorder, unspecified: Secondary | ICD-10-CM | POA: Insufficient documentation

## 2014-12-03 DIAGNOSIS — Z008 Encounter for other general examination: Secondary | ICD-10-CM | POA: Diagnosis present

## 2014-12-03 LAB — COMPREHENSIVE METABOLIC PANEL
ALK PHOS: 64 U/L (ref 38–126)
ALT: 17 U/L (ref 17–63)
AST: 24 U/L (ref 15–41)
Albumin: 3.9 g/dL (ref 3.5–5.0)
Anion gap: 14 (ref 5–15)
BILIRUBIN TOTAL: 0.4 mg/dL (ref 0.3–1.2)
BUN: 8 mg/dL (ref 6–20)
CALCIUM: 8.8 mg/dL — AB (ref 8.9–10.3)
CHLORIDE: 94 mmol/L — AB (ref 101–111)
CO2: 20 mmol/L — ABNORMAL LOW (ref 22–32)
CREATININE: 1.22 mg/dL (ref 0.61–1.24)
GFR calc Af Amer: >60 mL/min (ref >60)
Glucose, Bld: 89 mg/dL (ref 65–99)
Potassium: 4 mmol/L (ref 3.5–5.1)
Sodium: 128 mmol/L — ABNORMAL LOW (ref 135–145)
TOTAL PROTEIN: 6.8 g/dL (ref 6.5–8.1)

## 2014-12-03 LAB — URINALYSIS, ROUTINE W REFLEX MICROSCOPIC
Bilirubin Urine: NEGATIVE
Glucose, UA: NEGATIVE mg/dL
HGB URINE DIPSTICK: NEGATIVE
KETONES UR: 15 mg/dL — AB
Leukocytes, UA: NEGATIVE
Nitrite: NEGATIVE
PROTEIN: NEGATIVE mg/dL
Specific Gravity, Urine: 1.015 (ref 1.005–1.030)
UROBILINOGEN UA: 0.2 mg/dL (ref 0.0–1.0)
pH: 7 (ref 5.0–8.0)

## 2014-12-03 LAB — SALICYLATE LEVEL: SALICYLATE LVL: 14.2 mg/dL (ref 2.8–30.0)

## 2014-12-03 LAB — CBC
HEMATOCRIT: 32.4 % — AB (ref 39.0–52.0)
Hemoglobin: 10.7 g/dL — ABNORMAL LOW (ref 13.0–17.0)
MCH: 26.7 pg (ref 26.0–34.0)
MCHC: 33 g/dL (ref 30.0–36.0)
MCV: 80.8 fL (ref 78.0–100.0)
PLATELETS: 347 10*3/uL (ref 150–400)
RBC: 4.01 MIL/uL — ABNORMAL LOW (ref 4.22–5.81)
RDW: 17.3 % — AB (ref 11.5–15.5)
WBC: 6.1 10*3/uL (ref 4.0–10.5)

## 2014-12-03 LAB — RAPID URINE DRUG SCREEN, HOSP PERFORMED
AMPHETAMINES: NOT DETECTED
Barbiturates: NOT DETECTED
Benzodiazepines: POSITIVE — AB
COCAINE: NOT DETECTED
OPIATES: NOT DETECTED
TETRAHYDROCANNABINOL: NOT DETECTED

## 2014-12-03 LAB — ACETAMINOPHEN LEVEL: Acetaminophen (Tylenol), Serum: <10 ug/mL — ABNORMAL LOW (ref 10–30)

## 2014-12-03 LAB — ETHANOL: ALCOHOL ETHYL (B): 21 mg/dL — AB (ref <5)

## 2014-12-03 LAB — LIPASE, BLOOD: LIPASE: 34 U/L (ref 11–51)

## 2014-12-03 LAB — I-STAT TROPONIN, ED: Troponin i, poc: 0 ng/mL (ref 0.00–0.08)

## 2014-12-03 MED ORDER — CHLORDIAZEPOXIDE HCL 25 MG PO CAPS: ORAL_CAPSULE | ORAL | Status: DC

## 2014-12-03 MED ORDER — PANTOPRAZOLE SODIUM 40 MG IV SOLR
40.0000 mg | Freq: Once | INTRAVENOUS | Status: AC
  Administered 2014-12-03: 40 mg via INTRAVENOUS
  Filled 2014-12-03: qty 40

## 2014-12-03 MED ORDER — ONDANSETRON 4 MG PO TBDP: 4.0000 mg | ORAL_TABLET | Freq: Three times a day (TID) | ORAL | Status: AC | PRN

## 2014-12-03 MED ORDER — SODIUM CHLORIDE 0.9 % IV BOLUS (SEPSIS)
500.0000 mL | Freq: Once | INTRAVENOUS | Status: AC
  Administered 2014-12-03: 500 mL via INTRAVENOUS

## 2014-12-03 MED ORDER — ONDANSETRON HCL 4 MG/2ML IJ SOLN
4.0000 mg | INTRAMUSCULAR | Status: AC
  Administered 2014-12-03: 4 mg via INTRAVENOUS
  Filled 2014-12-03: qty 2

## 2014-12-03 NOTE — ED Notes (Signed)
Pt transported to radiology.

## 2014-12-03 NOTE — Discharge Instructions (Signed)
1. Medications: zofran for nausea, librium for withdrawal symptoms, usual home medications 2. Treatment: rest, drink plenty of fluids 3. Follow Up: please followup with your primary doctor this week for discussion of your diagnoses and further evaluation after today's visit; please use the resource guide to find outpatient detox; please return to the ER for severe chest pain, shortness of breath, persistent vomiting, new or worsening symptoms   Abdominal Pain, Adult Many things can cause abdominal pain. Usually, abdominal pain is not caused by a disease and will improve without treatment. It can often be observed and treated at home. Your health care provider will do a physical exam and possibly order blood tests and X-rays to help determine the seriousness of your pain. However, in many cases, more time must pass before a clear cause of the pain can be found. Before that point, your health care provider may not know if you need more testing or further treatment. HOME CARE INSTRUCTIONS Monitor your abdominal pain for any changes. The following actions may help to alleviate any discomfort you are experiencing:  Only take over-the-counter or prescription medicines as directed by your health care provider.  Do not take laxatives unless directed to do so by your health care provider.  Try a clear liquid diet (broth, tea, or water) as directed by your health care provider. Slowly move to a bland diet as tolerated. SEEK MEDICAL CARE IF:  You have unexplained abdominal pain.  You have abdominal pain associated with nausea or diarrhea.  You have pain when you urinate or have a bowel movement.  You experience abdominal pain that wakes you in the night.  You have abdominal pain that is worsened or improved by eating food.  You have abdominal pain that is worsened with eating fatty foods.  You have a fever. SEEK IMMEDIATE MEDICAL CARE IF:  Your pain does not go away within 2 hours.  You keep  throwing up (vomiting).  Your pain is felt only in portions of the abdomen, such as the right side or the left lower portion of the abdomen.  You pass bloody or black tarry stools. MAKE SURE YOU:  Understand these instructions.  Will watch your condition.  Will get help right away if you are not doing well or get worse.   This information is not intended to replace advice given to you by your health care provider. Make sure you discuss any questions you have with your health care provider.   Document Released: 10/19/2004 Document Revised: 09/30/2014 Document Reviewed: 09/18/2012 Elsevier Interactive Patient Education 2016 [employer].  Alcohol Abuse and Nutrition Alcohol abuse is any pattern of alcohol consumption that harms your health, relationships, or work. Alcohol abuse can affect how your body breaks down and absorbs nutrients from food by causing your liver to work abnormally. Additionally, many people who abuse alcohol do not eat enough carbohydrates, protein, fat, vitamins, and minerals. This can cause poor nutrition (malnutrition) and a lack of nutrients (nutrient deficiencies), which can lead to further complications. Nutrients that are commonly lacking (deficient) among people who abuse alcohol include:  Vitamins.  Vitamin A. This is stored in your liver. It is important for your vision, metabolism, and ability to fight off infections (immunity).  B vitamins. These include vitamins such as folate, thiamin, and niacin. These are important in new cell growth and maintenance.  Vitamin C. This plays an important role in iron absorption, wound healing, and immunity.  Vitamin D. This is produced by your liver, but  you can also get vitamin D from food. Vitamin D is necessary for your body to absorb and use calcium.  Minerals.  Calcium. This is important for your bones and your heart and blood vessel (cardiovascular) function.  Iron. This is important for blood, muscle, and  nervous system functioning.  Magnesium. This plays an important role in muscle and nerve function, and it helps to control blood sugar and blood pressure.  Zinc. This is important for the normal function of your nervous system and digestive system (gastrointestinal tract). Nutrition is an essential component of therapy for alcohol abuse. Your health care provider or dietitian will work with you to design a plan that can help restore nutrients to your body and prevent potential complications. WHAT IS MY PLAN? Your dietitian may develop a specific diet plan that is based on your condition and any other complications you may have. A diet plan will commonly include:  A balanced diet.  Grains: 6-8 oz per day.  Vegetables: 2-3 cups per day.  Fruits: 1-2 cups per day.  Meat and other protein: 5-6 oz per day.  Dairy: 2-3 cups per day.  Vitamin and mineral supplements. WHAT DO I NEED TO KNOW ABOUT ALCOHOL AND NUTRITION?  Consume foods that are high in antioxidants, such as grapes, berries, nuts, green tea, and dark green and orange vegetables. This can help to counteract some of the stress that is placed on your liver by consuming alcohol.  Avoid food and drinks that are high in fat and sugar. Foods such as sugared soft drinks, salty snack foods, and candy contain empty calories. This means that they lack important nutrients such as protein, fiber, and vitamins.  Eat frequent meals and snacks. Try to eat 5-6 small meals each day.  Eat a variety of fresh fruits and vegetables each day. This will help you get plenty of water, fiber, and vitamins in your diet.  Drink plenty of water and other clear fluids. Try to drink at least 48-64 oz (1.5-2 L) of water per day.  If you are a vegetarian, eat a variety of protein-rich foods. Pair whole grains with plant-based proteins at meals and snacks to obtain the greatest nutrient benefit from your food. For example, eat rice with beans, put peanut butter  on whole-grain toast, or eat oatmeal with sunflower seeds.  Soak beans and whole grains overnight before cooking. This can help your body to absorb the nutrients more easily.  Include foods fortified with vitamins and minerals in your diet. Commonly fortified foods include milk, orange juice, cereal, and bread.  If you are malnourished, your dietitian may recommend a high-protein, high-calorie diet. This may include:  2,000-3,000 calories (kilocalories) per day.  70-100 grams of protein per day.  Your health care provider may recommend a complete nutritional supplement beverage. This can help to restore calories, protein, and vitamins to your body. Depending on your condition, you may be advised to consume this instead of or in addition to meals.  Limit your intake of caffeine. Replace drinks like coffee and black tea with decaffeinated coffee and herbal tea.  Eat a variety of foods that are high in omega fatty acids. These include fish, nuts and seeds, and soybeans. These foods may help your liver to recover and may also stabilize your mood.  Certain medicines may cause changes in your appetite, taste, and weight. Work with your health care provider and dietitian to make any adjustments to your medicines and diet plan.  Include other healthy lifestyle  choices in your daily routine.  Be physically active.  Get enough sleep.  Spend time doing activities that you enjoy.  If you are unable to take in enough food and calories by mouth, your health care provider may recommend a feeding tube. This is a tube that passes through your nose and throat, directly into your stomach. Nutritional supplement beverages can be given to you through the feeding tube to help you get the nutrients you need.  Take vitamin or mineral supplements as recommended by your health care provider. WHAT FOODS CAN I EAT? Grains Enriched pasta. Enriched rice. Fortified whole-grain bread. Fortified whole-grain cereal.  Barley. Brown rice. Quinoa. Millet. Vegetables All fresh, frozen, and canned vegetables. Spinach. Kale. Artichoke. Carrots. Winter squash and pumpkin. Sweet potatoes. Broccoli. Cabbage. Cucumbers. Tomatoes. Sweet peppers. Green beans. Peas. Corn. Fruits All fresh and frozen fruits. Berries. Grapes. Mango. Papaya. Guava. Cherries. Apples. Bananas. Peaches. Plums. Pineapple. Watermelon. Cantaloupe. Oranges. Avocado. Meats and Other Protein Sources Beef liver. Lean beef. Pork. Fresh and canned chicken. Fresh fish. Oysters. Sardines. Canned tuna. Shrimp. Eggs with yolks. Nuts and seeds. Peanut butter. Beans and lentils. Soybeans. Tofu. Dairy Whole, low-fat, and nonfat milk. Whole, low-fat, and nonfat yogurt. Cottage cheese. Sour cream. Hard and soft cheeses. Beverages Water. Herbal tea. Decaffeinated coffee. Decaffeinated green tea. 100% fruit juice. 100% vegetable juice. Instant breakfast shakes. Condiments Ketchup. Mayonnaise. Mustard. Salad dressing. Barbecue sauce. Sweets and Desserts Sugar-free ice cream. Sugar-free pudding. Sugar-free gelatin. Fats and Oils Butter. Vegetable oil, flaxseed oil, olive oil, and walnut oil. Other Complete nutrition shakes. Protein bars. Sugar-free gum. The items listed above may not be a complete list of recommended foods or beverages. Contact your dietitian for more options. WHAT FOODS ARE NOT RECOMMENDED? Grains Sugar-sweetened breakfast cereals. Flavored instant oatmeal. Fried breads. Vegetables Breaded or deep-fried vegetables. Fruits Dried fruit with added sugar. Candied fruit. Canned fruit in syrup. Meats and Other Protein Sources Breaded or deep-fried meats. Dairy Flavored milks. Fried cheese curds or fried cheese sticks. Beverages Alcohol. Sugar-sweetened soft drinks. Sugar-sweetened tea. Caffeinated coffee and tea. Condiments Sugar. Honey. Agave nectar. Molasses. Sweets and Desserts Chocolate. Cake. Cookies. Candy. Other Potato chips.  Pretzels. Salted nuts. Candied nuts. The items listed above may not be a complete list of foods and beverages to avoid. Contact your dietitian for more information.   This information is not intended to replace advice given to you by your health care provider. Make sure you discuss any questions you have with your health care provider.   Document Released: 11/03/2004 Document Revised: 01/30/2014 Document Reviewed: 08/12/2013 Elsevier Interactive Patient Education 2016 [employer].   Emergency Department Resource Guide 1) Find a Doctor and Pay Out of Pocket Although you won't have to find out who is covered by your insurance plan, it is a good idea to ask around and get recommendations. You will then need to call the office and see if the doctor you have chosen will accept you as a new patient and what types of options they offer for patients who are self-pay. Some doctors offer discounts or will set up payment plans for their patients who do not have insurance, but you will need to ask so you aren't surprised when you get to your appointment.  2) Contact Your Local Health Department Not all health departments have doctors that can see patients for sick visits, but many do, so it is worth a call to see if yours does. If you don't know where your local health department is,  you can check in your phone book. The CDC also has a tool to help you locate your state's health department, and many state websites also have listings of all of their local health departments.  3) Find a Walk-in Clinic If your illness is not likely to be very severe or complicated, you may want to try a walk in clinic. These are popping up all over the country in pharmacies, drugstores, and shopping centers. They're usually staffed by nurse practitioners or physician assistants that have been trained to treat common illnesses and complaints. They're usually fairly quick and inexpensive. However, if you have serious medical  issues or chronic medical problems, these are probably not your best option.  No Primary Care Doctor: - Call Health Connect at  [phone] - they can help you locate a primary care doctor that  accepts your insurance, provides certain services, etc. - Physician Referral Service- [phone]  Chronic Pain Problems: Organization         Address  Phone   Notes  [facility]  [phone] Patients need to be referred by their primary care doctor.   Medication Assistance: Organization         Address  Phone   Notes  [facility] Medication [facility] [address] [place], [place] [address] [phone] --Must be a resident of [facility] [place] -- Must have NO insurance coverage whatsoever (no Medicaid/ Medicare, etc.) -- The pt. MUST have a primary care doctor that directs their care regularly and follows them in the community   MedAssist  [phone]   [employer]  [phone]    Agencies that provide inexpensive medical care: Organization         Address  Phone   Notes  [facility]  [phone]   [facility]    [phone]   [facility] [address] [place], [place] [address] [phone]   [facility] [address], [place] [phone]   Planned Parenthood    [phone]   [facility]    [phone]   [facility]  [address], [place] Phone:  [phone], Fax:  [phone] Hours of Operation:  9 am - 6 pm, M-F.  Also accepts Medicaid/Medicare and self-pay.  [facility]  [address], [place] Phone: [phone], Fax: [phone]. Hours of Operation:  8:30 am - 5:30 pm, M-F.  Also accepts Medicaid and self-pay.  [facility] [place] [address], [place] Phone: [phone]   [facility] [address], [place], [place]  [phone], Ext. [phone] Mondays & Thursdays: 7-9 AM.  First 15 patients are seen on a first come, first serve basis.    Medicaid-accepting [facility] Providers:  Organization         Address  Phone   Notes  [facility] [address], [place] [phone] Also accepts self-pay patients.  [facility] [address], [place]  [phone]) [phone]   [facility] [address], [place] [phone]   [facility] [address] [place] [address], [place] [phone]   Renaye RakersVeita Bland [address], [place]   [phone] Only accepts [place] Access [place] patients after they have their name  applied to their card.   Self-Pay (no insurance) in [facility]:  Organization         Address  Phone   Notes  Sickle Cell Patients, [facility] [address] [place], [place] [phone]   [facility] [address] [place], [place] [phone]   [facility] [place]  [address], [place] [phone]   [facility]/Dr. Osei-Bonsu  [address] [place] or [address], [place] [phone] Phone number for both [place] and [place] locations is the same.  [facility] and [facility] [address] [place] [phone]   [facility] [address] [place] or [address] [phone]   [facility] [address], [place] [phone], phone; [phone], fax Sees patients 1st and 3rd Saturday of every month.  Must not qualify for public or private insurance (i.e. Medicaid, Medicare, [place] Health Choice, Veterans' Benefits)  Household income should be no more than 200% of the poverty level The clinic cannot treat you if you are pregnant or think you are pregnant  Sexually transmitted  diseases are not treated at the clinic.    Dental Care: Organization         Address  Phone  Notes  [facility] of [facility] [address] [place], [place] [phone] Accepts children up to age 73 who are enrolled in [place] or [place] Health Choice; pregnant women with a Medicaid card; and children who have applied for Medicaid or [place] Health Choice, but were declined, whose parents can pay a reduced fee at time of service.  [facility]  [address], [place] [phone] Accepts children up to age 74 who are enrolled in [place] or [place] Health Choice; pregnant women with a Medicaid card; and children who have applied for Medicaid or [place] Health Choice, but were declined, whose parents can pay a reduced fee at time of service.  [facility]  [address] [place], [place] [phone] Patients are seen by appointment only. Walk-ins are not accepted. [facility] will see patients 47 years of age and older. Monday - Tuesday (8am-5pm) Most Wednesdays (8:30-5pm) $30 per visit, cash only  [facility] Adult Dental Access PROGRAM  [address], [facility] [phone] Patients are seen by appointment only. Walk-ins are not accepted. [facility] will see patients 63 years of age and older. One Wednesday Evening (Monthly: Volunteer Based).  $30 per visit, cash only  [employer]  [phone] for adults; Children under age 90, call Graduate Pediatric Dentistry at [phone]. Children aged 60-14, please call [phone] to request a pediatric application.  Dental services are provided in all areas of dental care including fillings, crowns and bridges, complete and partial dentures, implants, gum treatment, root canals, and extractions. Preventive care is also provided. Treatment is provided to both adults and children. Patients are selected via a  lottery and there is often a waiting list.   [facility] [address], [place]  [phone] [email or web address]   [facility] [address] [place], [place] [phone], Ext. [phone] Second and Fourth Thursday of each month, opens at 6:30 AM; Clinic ends at 9 AM.  Patients are seen on a first-come  first-served basis, and a limited number are seen during each clinic.   [facility]  [address] [place], [place] [phone]   Eligibility Requirements You must have lived in [place], [place], or [place] counties for at least the last three months.   You cannot be eligible for state or federal sponsored [place], including [employer], [place], or [employer].   You generally cannot be eligible for healthcare insurance through your employer.    How to apply: Eligibility screenings are held every Tuesday and Wednesday afternoon from 1:00 pm until 4:00 pm. You do not need an appointment for the interview!  [facility] [address] [place], [place] [phone]   [facility] Health Department  [phone]   [facility] Health Department  [phone]   [facility] Health Department  [phone]    Behavioral Health Resources in the Community: Intensive Outpatient Programs Organization         Address  Phone  Notes  [facility] [address] [place], [place] [phone]   [facility] Outpatient [address] [place], [place] [phone]   [facility] [address], [place], [place]  [phone]   [facility] [address],  [place], [place] [phone] or [phone]   Substance Abuse Resources Organization         Address  Phone  Notes  [facility] Drug Services  [phone]   [facility]  [phone]   [facility] [place]  [phone]   Floydene Flock  [phone]   Residential &  Outpatient Substance Abuse Program  [phone]   Psychological Services Organization         Address  Phone  Notes  [facility] Health  [phone]   [facility] Services  [phone]   [facility] Mental Health [address] [place] [phone] or [phone]    Mobile Crisis Teams Organization         Address  Phone  Notes  [facility], Mobile Crisis Care Unit  [phone]   [facility]  [address] [place], [place] [phone]   Doristine Locks [address] [place] [phone]    Self-Help/Support Groups Organization         Address  Phone             Notes  [facility] [place] - variety of support groups  [phone] Call for more information  Narcotics Anonymous (NA), [facility] [address], [employer] [place]  2 meetings at this location   Statistician         Address  Phone  Notes  [facility] [address],    [place]  [phone]   [facility]  [address] [place] [address], [place], [place] [phone]   [facility] [address] [place], [place] [phone] Admissions: 8am-3pm M-F  [facility] [address]    [place], [place] [phone]   [facility] [address] [place], [place] [phone]   [facility] [address],  [place], [place] [phone]   [facility] - Intensive Outpatient [address], [place], [place] [phone]   [facility] ([facility] [address] [place]  [place], [place] [phone] or [phone]   [facility] (RTS) [address], [place], [place] [phone] Accepts Medicaid  Fellowship [address],  [place] [phone] Substance Abuse/Addiction Treatment   [facility] Organization          Address  Phone  Notes  [facility]  [phone]   Domenic Schwab, PhD [address] [place], [place]   [phone] or [phone]   [place]low [place]ump[place], [place] [phone]   [address], [place], [place] [phone] Insurance/Medicaid/sponsorship through [facility] and Families [address]                                    [place], [place] [phone] [place] [address], [place] [phone]    Dr. Adele Schilder  [phone]   [facility] Dept. 1) [address] [place] 2) [place] 3)  [address], [place] [phone]  [phone]   [place] [phone] or [phone] (After Hours)

## 2014-12-03 NOTE — ED Notes (Signed)
Bing NeighborsFriend Bob phone number- [phone]

## 2014-12-03 NOTE — ED Provider Notes (Signed)
CSN: [id number]     Arrival date & time 12/03/14  1056 History   First MD Initiated Contact with Patient 12/03/14 1116     Chief Complaint  Patient presents with  . Chest Pain  . Medical Clearance    detox     HPI   [name] is a 64 y.o. male with a PMH of HTN, HLD, CAD, GERD, alcohol abuse, bipolar disorder, depression, anxiety who presents to the ED with bilateral chest pain x 2 days and is requesting alcohol detox. He reports his symptoms are constant. He denies exacerbating factors. He has not tried anything for symptom relief. He reports associated abdominal pain, nausea, vomiting, and diarrhea. He denies hematemesis, hematochezia, or melena. He states he has been drinking 5-6 shots of bourbon daily since he was discharged from the hospital. He reports headache, dizziness, shortness of breath. He denies numbness, weakness, paresthesia.   Past Medical History  Diagnosis Date  . MVP (mitral valve prolapse)   . Anxiety   . Hypertension   . Hyperlipidemia   . Palpitations   . Bipolar disorder (HCC)   . Depression   . Esophagitis   . Gastritis   . Insomnia   . Hypercholesteremia   . Coronary artery disease     a.  s/p PTCA to PL branch of RCA in 2007;  b.  Last LHC 04/2008:  LM with irregularities, LAD 50%, mid LAD 50%, proximal D1 80-90%, proximal OM1 30-40%, proximal OM2 50%, PLB branch 30-40%, site of prior angioplasty in the RCA widely patent, EF 65%=>Med Rx;  c. Nuclear study a 12/2011: EF 71% and normal perfusion.    Marland Kitchen History of echocardiogram     Echo 08/16/09: Mild LVH, EF 55-60%, no normal wall motion, grade 1 diastolic dysfunction  . Anginal pain (HCC)   . Pneumonia 2005  . GERD (gastroesophageal reflux disease)   . Arthritis     "hands; especially in cold weather" (12/11/2013)  . Alcohol related seizure (HCC)     "when I go off it"   Past Surgical History  Procedure Laterality Date  . Coronary angioplasty  2007    RCA  . Cardiac catheterization  04/2008    EF  65%  . Knee arthroscopy, medial patello femoral ligament repair    . Hip arthroplasty Bilateral     "both replaced twice"   . Total shoulder replacement Right   . Total hip revision  02/07/2012    Procedure: TOTAL HIP REVISION;  Surgeon: [name], MD;  Location: [facility] OR;  Service: Orthopedics;  Laterality: Left;  [place] Hip Revision Set  . Total knee arthroplasty Right   . Appendectomy  1958  . Tonsillectomy  1958  . Joint replacement     Family History  Problem Relation Age of Onset  . Heart disease Neg Hx   . Prostate cancer Father    Social History  Substance Use Topics  . Smoking status: Current Some Day Smoker -- 0.50 packs/day for 40 years    Types: Cigarettes  . Smokeless tobacco: Never Used  . Alcohol Use: 0.0 oz/week     Comment: patient drinks 2-3 times a week     Review of Systems  Constitutional: Negative for fever and chills.  Respiratory: Negative for shortness of breath.   Cardiovascular: Positive for chest pain.  Gastrointestinal: Positive for nausea, vomiting, abdominal pain and diarrhea. Negative for constipation and blood in stool.  Neurological: Positive for dizziness and headaches. Negative for syncope,  weakness, light-headedness and numbness.  All other systems reviewed and are negative.     Allergies  Ambien and Codeine  Home Medications   Prior to Admission medications   Medication Sig Start Date End Date Taking? Authorizing Provider  acetaminophen (TYLENOL) 325 MG tablet Take 2 tablets (650 mg total) by mouth every 6 (six) hours as needed for mild pain, moderate pain, fever or headache (or Fever >/= 101). 11/28/14  Yes Elease Etienne, MD  aspirin EC 325 MG tablet Take 1 tablet (325 mg total) by mouth every morning. For blood thinner for heart health 04/02/14  Yes Alison Murray, MD  atorvastatin (LIPITOR) 40 MG tablet Take 1 tablet (40 mg total) by mouth daily. For cholesterol 10/24/12  Yes Sanjuana Kava, NP  buPROPion (WELLBUTRIN XL) 300 MG  24 hr tablet Take 1 tablet (300 mg total) by mouth daily. For depression 10/24/12  Yes Sanjuana Kava, NP  folic acid (FOLVITE) 1 MG tablet Take 1 tablet (1 mg total) by mouth daily. 11/28/14  Yes Elease Etienne, MD  lisinopril (PRINIVIL,ZESTRIL) 20 MG tablet Take 20 mg by mouth daily.   Yes Historical Provider, MD  metoprolol tartrate (LOPRESSOR) 25 MG tablet Take 25 mg by mouth at bedtime.   Yes Historical Provider, MD  Multiple Vitamin (MULTIVITAMIN WITH MINERALS) TABS tablet Take 1 tablet by mouth daily. 11/28/14  Yes Elease Etienne, MD  nicotine (NICODERM CQ - DOSED IN MG/24 HOURS) 21 mg/24hr patch Place 1 patch (21 mg total) onto the skin daily. 11/28/14  Yes Elease Etienne, MD  nitroGLYCERIN (NITROSTAT) 0.4 MG SL tablet Place 1 tablet (0.4 mg total) under the tongue every 5 (five) minutes as needed for chest pain (CP or SOB). 10/24/12  Yes Sanjuana Kava, NP  pantoprazole (PROTONIX) 40 MG tablet Take 1 tablet (40 mg total) by mouth daily. 11/28/14  Yes Elease Etienne, MD  QUEtiapine (SEROQUEL XR) 200 MG 24 hr tablet Take 200 mg by mouth daily.   Yes Historical Provider, MD  ALPRAZolam Prudy Feeler) 0.5 MG tablet Take 0.25-0.5 tablets by mouth 3 (three) times daily as needed. 08/31/14   Historical Provider, MD  QUEtiapine (SEROQUEL XR) 50 MG TB24 24 hr tablet Take 2 tablets (100 mg total) by mouth at bedtime. Patient not taking: Reported on 12/03/2014 07/19/14   Maretta Bees, MD  thiamine 100 MG tablet Take 1 tablet (100 mg total) by mouth daily. Patient not taking: Reported on 12/03/2014 11/28/14   Elease Etienne, MD    BP 160/101 mmHg  Pulse 95  Temp(Src) 98.8 F (37.1 C) (Oral)  Resp 22  Ht 5\' 10"  (1.778 m)  Wt 180 lb (81.647 kg)  BMI 25.83 kg/m2  SpO2 98% Physical Exam  Constitutional: He is oriented to person, place, and time. No distress.  Chronically ill appearing male in no acute distress.  HENT:  Head: Normocephalic and atraumatic.  Right Ear: External ear normal.  Left  Ear: External ear normal.  Nose: Nose normal.  Mouth/Throat: Uvula is midline, oropharynx is clear and moist and mucous membranes are normal.  Eyes: Conjunctivae, EOM and lids are normal. Pupils are equal, round, and reactive to light. Right eye exhibits no discharge. Left eye exhibits no discharge. No scleral icterus.  Neck: Normal range of motion. Neck supple.  Cardiovascular: Normal rate, regular rhythm, normal heart sounds, intact distal pulses and normal pulses.   Pulmonary/Chest: Effort normal and breath sounds normal. No respiratory distress. He has no  wheezes. He has no rales.  Abdominal: Soft. Normal appearance and bowel sounds are normal. He exhibits no distension and no mass. There is tenderness. There is no rigidity, no rebound and no guarding.  TTP in upper quadrants bilaterally.  Musculoskeletal: Normal range of motion. He exhibits no edema or tenderness.  Neurological: He is alert and oriented to person, place, and time. He has normal strength. No cranial nerve deficit or sensory deficit.  Skin: Skin is warm, dry and intact. No rash noted. He is not diaphoretic. No erythema. No pallor.  Psychiatric: He has a normal mood and affect. His speech is normal and behavior is normal.  Nursing note and vitals reviewed.   ED Course  Procedures (including critical care time)  Labs Review Labs Reviewed  COMPREHENSIVE METABOLIC PANEL - Abnormal; Notable for the following:    Sodium 128 (*)    Chloride 94 (*)    CO2 20 (*)    Calcium 8.8 (*)    All other components within normal limits  ETHANOL - Abnormal; Notable for the following:    Alcohol, Ethyl (B) 21 (*)    All other components within normal limits  CBC - Abnormal; Notable for the following:    RBC 4.01 (*)    Hemoglobin 10.7 (*)    HCT 32.4 (*)    RDW 17.3 (*)    All other components within normal limits  ACETAMINOPHEN LEVEL - Abnormal; Notable for the following:    Acetaminophen (Tylenol), Serum <10 (*)    All other  components within normal limits  SALICYLATE LEVEL  URINE RAPID DRUG SCREEN, HOSP PERFORMED  I-STAT TROPOININ, ED    Imaging Review Dg Chest 2 View  12/03/2014  CLINICAL DATA:  Bilateral chest pain for 2 days with nausea. EXAM: CHEST - 2 VIEW COMPARISON:  Two-view chest x-ray 11/25/2014. FINDINGS: The heart is mildly enlarged. Emphysematous changes are again noted. Remote right-sided rib fractures are present. There is no edema or effusion to suggest failure. Right shoulder prosthesis is noted. IMPRESSION: 1. Emphysema. 2. No acute cardiopulmonary disease. Electronically Signed   By: Marin Roberts M.D.   On: 12/03/2014 12:22     I have personally reviewed and evaluated these images and lab results as part of my medical decision-making.   EKG Interpretation   Date/Time:  Thursday December 03 2014 11:13:05 EST Ventricular Rate:  99 PR Interval:  215 QRS Duration: 90 QT Interval:  357 QTC Calculation: 458 R Axis:   41 Text Interpretation:  Sinus rhythm Prolonged PR interval Minimal ST  elevation, lateral leads no acute ischemia No significant change since  last tracing Confirmed by Kandis Mannan ([id number]) on 12/03/2014 11:42:52  AM      MDM   Final diagnoses:  Abdominal pain, unspecified abdominal location  Alcohol abuse    64 year old male presents with chest pain and is requesting alcohol detox. Reports associated abdominal pain, nausea, vomiting, and diarrhea. Denies hematemesis, hematochezia, or melena. States he has been drinking 5-6 shots of bourbon daily since he was discharged from the hospital. Reports headache, dizziness, shortness of breath. Denies numbness, weakness, paresthesia. Patient complains of chest pain, though when asked to localize his pain, he points to the right upper quadrant and left upper quadrant of his abdomen. Patient was recently admitted for hyponatremia, and also complained of chest pain, which was attributed to alcoholic gastritis at that  time (11/2-11/5).  Patient is afebrile. Vital signs stable. Heart regular rate and rhythm. Lungs clear to auscultation bilaterally.  Abdomen soft, nondistended, with mild tenderness to palpation in bilateral upper quadrants. No rebound, guarding, or masses. No lower extremity edema. Normal neuro exam with no focal deficit.  EKG remarkable for sinus rhythm, prolonged PR interval, no significant change since last tracing. Troponin negative. CBC remarkable for hemoglobin 10.7, which appears improved from prior. CMP remarkable for sodium 128, which appears chronic and stable from patient's baseline. Lipase within normal limits. UA negative for infection. UDS positive for benzodiazepines. Ethanol 21. Acetaminophen and salicylate negative. Chest x-ray negative for acute cardiopulmonary disease.  Patient reports symptom improvement. Feel he is stable for discharge at this time. Will given nausea medication and librium taper. Patient advised to use resource list to find outpatient detox program. Return precautions discussed. Patient verbalizes his understanding and is in agreement with plan.  BP 160/101 mmHg  Pulse 95  Temp(Src) 98.8 F (37.1 C) (Oral)  Resp 22  Ht 5\' 10"  (1.778 m)  Wt 180 lb (81.647 kg)  BMI 25.83 kg/m2  SpO2 98%     Mady Gemmalizabeth C Mikaila Grunert, PA-C 12/03/14 2221  Courteney Randall AnLyn Mackuen, MD 12/05/14 [phone]

## 2014-12-03 NOTE — ED Notes (Signed)
Pt reports since we was discharged from hospital he has been drinking 5-6 shots/day. Last drink early this morning. Pt requesting detox. Pt also reports bilateral cp x 2 days with nvd.

## 2014-12-03 NOTE — ED Notes (Signed)
Pt sts he spoke with [facility] this morning and they told him to come here to be seen.

## 2014-12-21 ENCOUNTER — Encounter (HOSPITAL_COMMUNITY): Payer: Self-pay

## 2014-12-21 ENCOUNTER — Inpatient Hospital Stay (HOSPITAL_COMMUNITY)
Admission: EM | Admit: 2014-12-21 | Discharge: 2014-12-23 | DRG: 392 | Disposition: A | Discharge status: Home / Self Care | Payer: Medicare Other | Attending: Internal Medicine | Admitting: Internal Medicine

## 2014-12-21 ENCOUNTER — Emergency Department (HOSPITAL_COMMUNITY): Payer: Medicare Other

## 2014-12-21 DIAGNOSIS — E860 Dehydration: Secondary | ICD-10-CM | POA: Diagnosis not present

## 2014-12-21 DIAGNOSIS — I2510 Atherosclerotic heart disease of native coronary artery without angina pectoris: Secondary | ICD-10-CM | POA: Diagnosis present

## 2014-12-21 DIAGNOSIS — R112 Nausea with vomiting, unspecified: Secondary | ICD-10-CM | POA: Diagnosis present

## 2014-12-21 DIAGNOSIS — I959 Hypotension, unspecified: Secondary | ICD-10-CM | POA: Diagnosis not present

## 2014-12-21 DIAGNOSIS — Z96611 Presence of right artificial shoulder joint: Secondary | ICD-10-CM | POA: Diagnosis not present

## 2014-12-21 DIAGNOSIS — R079 Chest pain, unspecified: Secondary | ICD-10-CM | POA: Diagnosis not present

## 2014-12-21 DIAGNOSIS — F419 Anxiety disorder, unspecified: Secondary | ICD-10-CM | POA: Diagnosis not present

## 2014-12-21 DIAGNOSIS — N179 Acute kidney failure, unspecified: Secondary | ICD-10-CM | POA: Diagnosis not present

## 2014-12-21 DIAGNOSIS — G4700 Insomnia, unspecified: Secondary | ICD-10-CM | POA: Diagnosis not present

## 2014-12-21 DIAGNOSIS — K219 Gastro-esophageal reflux disease without esophagitis: Secondary | ICD-10-CM | POA: Diagnosis not present

## 2014-12-21 DIAGNOSIS — F319 Bipolar disorder, unspecified: Secondary | ICD-10-CM | POA: Diagnosis not present

## 2014-12-21 DIAGNOSIS — Z96651 Presence of right artificial knee joint: Secondary | ICD-10-CM | POA: Diagnosis not present

## 2014-12-21 DIAGNOSIS — Z79899 Other long term (current) drug therapy: Secondary | ICD-10-CM

## 2014-12-21 DIAGNOSIS — Z96643 Presence of artificial hip joint, bilateral: Secondary | ICD-10-CM | POA: Diagnosis not present

## 2014-12-21 DIAGNOSIS — E7800 Pure hypercholesterolemia, unspecified: Secondary | ICD-10-CM | POA: Diagnosis present

## 2014-12-21 DIAGNOSIS — M1990 Unspecified osteoarthritis, unspecified site: Secondary | ICD-10-CM | POA: Diagnosis present

## 2014-12-21 DIAGNOSIS — E871 Hypo-osmolality and hyponatremia: Secondary | ICD-10-CM | POA: Diagnosis present

## 2014-12-21 DIAGNOSIS — E861 Hypovolemia: Secondary | ICD-10-CM | POA: Diagnosis present

## 2014-12-21 DIAGNOSIS — Z7982 Long term (current) use of aspirin: Secondary | ICD-10-CM | POA: Diagnosis not present

## 2014-12-21 DIAGNOSIS — F17210 Nicotine dependence, cigarettes, uncomplicated: Secondary | ICD-10-CM | POA: Diagnosis present

## 2014-12-21 DIAGNOSIS — I10 Essential (primary) hypertension: Secondary | ICD-10-CM | POA: Diagnosis present

## 2014-12-21 DIAGNOSIS — D649 Anemia, unspecified: Secondary | ICD-10-CM | POA: Diagnosis present

## 2014-12-21 DIAGNOSIS — E222 Syndrome of inappropriate secretion of antidiuretic hormone: Secondary | ICD-10-CM | POA: Diagnosis not present

## 2014-12-21 DIAGNOSIS — E876 Hypokalemia: Secondary | ICD-10-CM | POA: Diagnosis present

## 2014-12-21 DIAGNOSIS — I341 Nonrheumatic mitral (valve) prolapse: Secondary | ICD-10-CM | POA: Diagnosis not present

## 2014-12-21 DIAGNOSIS — A084 Viral intestinal infection, unspecified: Principal | ICD-10-CM | POA: Diagnosis present

## 2014-12-21 DIAGNOSIS — R197 Diarrhea, unspecified: Secondary | ICD-10-CM

## 2014-12-21 DIAGNOSIS — R45851 Suicidal ideations: Secondary | ICD-10-CM

## 2014-12-21 LAB — TYPE AND SCREEN
ABO/RH(D): O POS
Antibody Screen: NEGATIVE

## 2014-12-21 LAB — HEPATIC FUNCTION PANEL
ALT: 11 U/L — ABNORMAL LOW (ref 17–63)
AST: 22 U/L (ref 15–41)
Albumin: 3.3 g/dL — ABNORMAL LOW (ref 3.5–5.0)
Alkaline Phosphatase: 78 U/L (ref 38–126)
BILIRUBIN TOTAL: 0.4 mg/dL (ref 0.3–1.2)
Total Protein: 6.3 g/dL — ABNORMAL LOW (ref 6.5–8.1)

## 2014-12-21 LAB — I-STAT CHEM 8, ED
BUN: 16 mg/dL (ref 6–20)
CREATININE: 1.5 mg/dL — AB (ref 0.61–1.24)
Calcium, Ion: 0.96 mmol/L — ABNORMAL LOW (ref 1.13–1.30)
Chloride: 89 mmol/L — ABNORMAL LOW (ref 101–111)
GLUCOSE: 65 mg/dL (ref 65–99)
HEMATOCRIT: 40 % (ref 39.0–52.0)
HEMOGLOBIN: 13.6 g/dL (ref 13.0–17.0)
Potassium: 3.6 mmol/L (ref 3.5–5.1)
SODIUM: 119 mmol/L — AB (ref 135–145)
TCO2: 19 mmol/L (ref 0–100)

## 2014-12-21 LAB — RAPID URINE DRUG SCREEN, HOSP PERFORMED
AMPHETAMINES: NOT DETECTED
BENZODIAZEPINES: POSITIVE — AB
Barbiturates: NOT DETECTED
COCAINE: NOT DETECTED
OPIATES: NOT DETECTED
TETRAHYDROCANNABINOL: NOT DETECTED

## 2014-12-21 LAB — BASIC METABOLIC PANEL
Anion gap: 16 — ABNORMAL HIGH (ref 5–15)
BUN: 15 mg/dL (ref 6–20)
CHLORIDE: 85 mmol/L — AB (ref 101–111)
CO2: 17 mmol/L — AB (ref 22–32)
Calcium: 8.1 mg/dL — ABNORMAL LOW (ref 8.9–10.3)
Creatinine, Ser: 1.5 mg/dL — ABNORMAL HIGH (ref 0.61–1.24)
GFR calc Af Amer: 55 mL/min — ABNORMAL LOW (ref >60)
GFR calc non Af Amer: 48 mL/min — ABNORMAL LOW (ref >60)
GLUCOSE: 66 mg/dL (ref 65–99)
POTASSIUM: 3.6 mmol/L (ref 3.5–5.1)
Sodium: 118 mmol/L — CL (ref 135–145)

## 2014-12-21 LAB — I-STAT TROPONIN, ED
TROPONIN I, POC: 0.01 ng/mL (ref 0.00–0.08)
Troponin i, poc: 0 ng/mL (ref 0.00–0.08)

## 2014-12-21 LAB — CBC
HEMATOCRIT: 34.4 % — AB (ref 39.0–52.0)
Hemoglobin: 11 g/dL — ABNORMAL LOW (ref 13.0–17.0)
MCH: 25.5 pg — AB (ref 26.0–34.0)
MCHC: 32 g/dL (ref 30.0–36.0)
MCV: 79.8 fL (ref 78.0–100.0)
Platelets: 273 10*3/uL (ref 150–400)
RBC: 4.31 MIL/uL (ref 4.22–5.81)
RDW: 16.8 % — AB (ref 11.5–15.5)
WBC: 5.5 10*3/uL (ref 4.0–10.5)

## 2014-12-21 LAB — I-STAT CG4 LACTIC ACID, ED
LACTIC ACID, VENOUS: 0.63 mmol/L (ref 0.5–2.0)
Lactic Acid, Venous: 0.75 mmol/L (ref 0.5–2.0)

## 2014-12-21 LAB — OSMOLALITY, URINE: Osmolality, Ur: 135 mOsm/kg — ABNORMAL LOW (ref 300–900)

## 2014-12-21 LAB — SODIUM, URINE, RANDOM: Sodium, Ur: 25 mmol/L

## 2014-12-21 MED ORDER — SODIUM CHLORIDE 0.9 % IV BOLUS (SEPSIS)
1000.0000 mL | Freq: Once | INTRAVENOUS | Status: AC
Start: 2014-12-21 — End: 2014-12-21
  Administered 2014-12-21: 1000 mL via INTRAVENOUS

## 2014-12-21 MED ORDER — IOHEXOL 350 MG/ML SOLN
100.0000 mL | Freq: Once | INTRAVENOUS | Status: AC | PRN
  Administered 2014-12-21: 100 mL via INTRAVENOUS

## 2014-12-21 MED ORDER — SODIUM CHLORIDE 0.9 % IV BOLUS (SEPSIS)
1000.0000 mL | Freq: Once | INTRAVENOUS | Status: AC
  Administered 2014-12-21: 1000 mL via INTRAVENOUS

## 2014-12-21 MED ORDER — FENTANYL CITRATE (PF) 100 MCG/2ML IJ SOLN
25.0000 ug | INTRAMUSCULAR | Status: DC | PRN
  Filled 2014-12-21: qty 2

## 2014-12-21 NOTE — ED Notes (Signed)
Dr. Schlossman at bedside. 

## 2014-12-21 NOTE — ED Notes (Signed)
Per EMS: pt here with c/o dull central CP onset this morning around 1000 associated with SOB and left arm tingling. O2-98%, 90/60 with EMS, HR-96. Pt took two 325mg  tablets of aspirin 2 hours PTA.

## 2014-12-21 NOTE — ED Notes (Signed)
Chris EMT at bedside to sit.

## 2014-12-21 NOTE — ED Notes (Signed)
MD Schlossman aware of NA of 118

## 2014-12-21 NOTE — ED Notes (Signed)
Pt requesting pain anxiety medication EDP aware. No new orders. Pt reports that he is scared to go home, sts he is scared of "what he might do" Pt sts that he has SI unless he is here. Pt also reports that he "needs somewhere to sleep tonight" but reports having an apartment of his own. Pt recanted statement of SI when he was told that he would be admitted. Pt is angry and agitated toward staff, then apologetic when confronted about behavior.

## 2014-12-21 NOTE — ED Notes (Signed)
MD at bedside. 

## 2014-12-21 NOTE — ED Notes (Signed)
MD aware of BP

## 2014-12-21 NOTE — ED Notes (Signed)
1L bolus of normal saline started.

## 2014-12-21 NOTE — ED Provider Notes (Signed)
CSN: [id number]     Arrival date & time 12/21/14  1841 History   First MD Initiated Contact with Patient 12/21/14 1859     Chief Complaint  Patient presents with  . Chest Pain  . Shortness of Breath     (Consider location/radiation/quality/duration/timing/severity/associated sxs/prior Treatment) HPI Comments: 64 year old male with a history of hypertension, alcohol dependence, bipolar, coronary artery disease, hyperlipidemia who presents with concern for dull chest pain and diarrhea which began this morning.  Reports 15 episodes of diarrhea. No recent abx, no sick contacts. No melena/hematochezia. Nausea no vomiting. Decreased po intake.  No new medications, no drugs, no suicidal ideation.  No nitro Chest pain dull, constant, better leaning forwards, worse with deep breaths, mild sob, nausea CP there since this AM, constant unchanging, 10/10    Past Medical History  Diagnosis Date  . MVP (mitral valve prolapse)   . Anxiety   . Hypertension   . Hyperlipidemia   . Palpitations   . Bipolar disorder (HCC)   . Depression   . Esophagitis   . Gastritis   . Insomnia   . Hypercholesteremia   . Coronary artery disease     a.  s/p PTCA to PL branch of RCA in 2007;  b.  Last LHC 04/2008:  LM with irregularities, LAD 50%, mid LAD 50%, proximal D1 80-90%, proximal OM1 30-40%, proximal OM2 50%, PLB branch 30-40%, site of prior angioplasty in the RCA widely patent, EF 65%=>Med Rx;  c. Nuclear study a 12/2011: EF 71% and normal perfusion.    Marland Kitchen History of echocardiogram     Echo 08/16/09: Mild LVH, EF 55-60%, no normal wall motion, grade 1 diastolic dysfunction  . Anginal pain (HCC)   . Pneumonia 2005  . GERD (gastroesophageal reflux disease)   . Arthritis     "hands; especially in cold weather" (12/11/2013)  . Alcohol related seizure (HCC)     "when I go off it"   Past Surgical History  Procedure Laterality Date  . Coronary angioplasty  2007    RCA  . Cardiac catheterization  04/2008     EF 65%  . Knee arthroscopy, medial patello femoral ligament repair    . Hip arthroplasty Bilateral     "both replaced twice"   . Total shoulder replacement Right   . Total hip revision  02/07/2012    Procedure: TOTAL HIP REVISION;  Surgeon: [name], MD;  Location: [facility] OR;  Service: Orthopedics;  Laterality: Left;  [place] Hip Revision Set  . Total knee arthroplasty Right   . Appendectomy  1958  . Tonsillectomy  1958  . Joint replacement     Family History  Problem Relation Age of Onset  . Heart disease Neg Hx   . Prostate cancer Father    Social History  Substance Use Topics  . Smoking status: Current Some Day Smoker -- 0.50 packs/day for 40 years    Types: Cigarettes  . Smokeless tobacco: Never Used  . Alcohol Use: 0.0 oz/week     Comment: patient drinks 2-3 times a week    Review of Systems  Constitutional: Positive for fatigue. Negative for fever.  HENT: Negative for sore throat.   Eyes: Negative for visual disturbance.  Respiratory: Positive for shortness of breath. Negative for cough (occasional).   Cardiovascular: Positive for chest pain. Negative for leg swelling.  Gastrointestinal: Positive for nausea. Negative for vomiting, abdominal pain, diarrhea (15x today), constipation, blood in stool and anal bleeding.  Genitourinary: Negative for difficulty  urinating.  Musculoskeletal: Negative for back pain and neck stiffness.  Skin: Negative for rash.  Neurological: Positive for light-headedness. Negative for syncope and headaches.  Psychiatric/Behavioral: Positive for suicidal ideas (reports while in ED< initially denies).      Allergies  Ambien; Codeine; and Promethazine  Home Medications   Prior to Admission medications   Medication Sig Start Date End Date Taking? Authorizing Provider  acetaminophen (TYLENOL) 325 MG tablet Take 2 tablets (650 mg total) by mouth every 6 (six) hours as needed for mild pain, moderate pain, fever or headache (or Fever >/= 101).  11/28/14  Yes Elease Etienne, MD  aspirin EC 325 MG tablet Take 1 tablet (325 mg total) by mouth every morning. For blood thinner for heart health 04/02/14  Yes Alison Murray, MD  atorvastatin (LIPITOR) 40 MG tablet Take 1 tablet (40 mg total) by mouth daily. For cholesterol 10/24/12  Yes Sanjuana Kava, NP  buPROPion (WELLBUTRIN XL) 300 MG 24 hr tablet Take 1 tablet (300 mg total) by mouth daily. For depression 10/24/12  Yes Sanjuana Kava, NP  lisinopril (PRINIVIL,ZESTRIL) 20 MG tablet Take 20 mg by mouth daily.   Yes Historical Provider, MD  LORazepam (ATIVAN) 1 MG tablet Take 1 mg by mouth 3 (three) times daily. Take 3 tablets by mouth every day per patient   Yes Historical Provider, MD  metoprolol tartrate (LOPRESSOR) 25 MG tablet Take 25 mg by mouth at bedtime.   Yes Historical Provider, MD  nicotine (NICODERM CQ - DOSED IN MG/24 HOURS) 21 mg/24hr patch Place 1 patch (21 mg total) onto the skin daily. 11/28/14  Yes Elease Etienne, MD  nitroGLYCERIN (NITROSTAT) 0.4 MG SL tablet Place 1 tablet (0.4 mg total) under the tongue every 5 (five) minutes as needed for chest pain (CP or SOB). 10/24/12  Yes Sanjuana Kava, NP  ondansetron (ZOFRAN ODT) 4 MG disintegrating tablet Take 1 tablet (4 mg total) by mouth every 8 (eight) hours as needed for nausea. 12/03/14  Yes Mady Gemma, PA-C  pantoprazole (PROTONIX) 40 MG tablet Take 1 tablet (40 mg total) by mouth daily. Patient taking differently: Take 40 mg by mouth daily. Prn 11/28/14  Yes Elease Etienne, MD  QUEtiapine (SEROQUEL) 200 MG tablet Take 200 mg by mouth at bedtime.   Yes Historical Provider, MD  chlordiazePOXIDE (LIBRIUM) 25 MG capsule 50mg  PO TID x 1D, then 25-50mg  PO BID X 1D, then 25-50mg  PO QD X 1D Patient not taking: Reported on 12/21/2014 12/03/14   Mady Gemma, PA-C  folic acid (FOLVITE) 1 MG tablet Take 1 tablet (1 mg total) by mouth daily. Patient not taking: Reported on 12/21/2014 11/28/14   Elease Etienne, MD   Multiple Vitamin (MULTIVITAMIN WITH MINERALS) TABS tablet Take 1 tablet by mouth daily. Patient not taking: Reported on 12/21/2014 11/28/14   Elease Etienne, MD  QUEtiapine (SEROQUEL XR) 50 MG TB24 24 hr tablet Take 2 tablets (100 mg total) by mouth at bedtime. Patient not taking: Reported on 12/03/2014 07/19/14   Maretta Bees, MD  thiamine 100 MG tablet Take 1 tablet (100 mg total) by mouth daily. Patient not taking: Reported on 12/03/2014 11/28/14   Elease Etienne, MD   BP 114/81 mmHg  Pulse 93  Temp(Src) 98.6 F (37 C) (Oral)  Resp 21  Ht 5\' 10"  (1.778 m)  Wt 170 lb (77.111 kg)  BMI 24.39 kg/m2  SpO2 100% Physical Exam  Constitutional: He is oriented to  person, place, and time. He appears well-developed and well-nourished. He has a sickly appearance. He appears ill. No distress.  HENT:  Head: Normocephalic and atraumatic.  Eyes: Conjunctivae and EOM are normal.  Neck: Normal range of motion.  Cardiovascular: Normal rate, regular rhythm, normal heart sounds and intact distal pulses.  Exam reveals no gallop and no friction rub.   No murmur heard. Pulmonary/Chest: Effort normal and breath sounds normal. No respiratory distress. He has no wheezes. He has no rales.  Abdominal: Soft. He exhibits no distension. There is no tenderness. There is no guarding.  Musculoskeletal: He exhibits no edema.  Neurological: He is alert and oriented to person, place, and time.  Skin: Skin is warm and dry. He is not diaphoretic.  Nursing note and vitals reviewed.   ED Course  Procedures (including critical care time) Labs Review Labs Reviewed  BASIC METABOLIC PANEL - Abnormal; Notable for the following:    Sodium 118 (*)    Chloride 85 (*)    CO2 17 (*)    Creatinine, Ser 1.50 (*)    Calcium 8.1 (*)    GFR calc non Af Amer 48 (*)    GFR calc Af Amer 55 (*)    Anion gap 16 (*)    All other components within normal limits  CBC - Abnormal; Notable for the following:    Hemoglobin 11.0  (*)    HCT 34.4 (*)    MCH 25.5 (*)    RDW 16.8 (*)    All other components within normal limits  I-STAT CHEM 8, ED - Abnormal; Notable for the following:    Sodium 119 (*)    Chloride 89 (*)    Creatinine, Ser 1.50 (*)    Calcium, Ion 0.96 (*)    All other components within normal limits  PROTIME-INR  HEPATIC FUNCTION PANEL  I-STAT TROPOININ, ED  I-STAT TROPOININ, ED  I-STAT CG4 LACTIC ACID, ED  I-STAT TROPOININ, ED  I-STAT CG4 LACTIC ACID, ED  TYPE AND SCREEN    Imaging Review Dg Chest Portable 1 View  12/21/2014  CLINICAL DATA:  64 year old male with acute chest pain and shortness of breath today. EXAM: PORTABLE CHEST 1 VIEW COMPARISON:  12/03/2014 and prior chest radiographs FINDINGS: Cardiomegaly identified. There is no evidence of focal airspace disease, pulmonary edema, suspicious pulmonary nodule/mass, pleural effusion, or pneumothorax. No acute bony abnormalities are identified. Right shoulder hemiarthroplasty again noted. IMPRESSION: Cardiomegaly without evidence of acute cardiopulmonary disease. Electronically Signed   By: Harmon PierJeffrey  Hu M.D.   On: 12/21/2014 19:32   Ct Angio Chest Aorta W/cm &/or Wo/cm  12/21/2014  CLINICAL DATA:  Chest pain and shortness of Breath EXAM: CT ANGIOGRAPHY CHEST WITH CONTRAST TECHNIQUE: Multidetector CT imaging of the chest was performed using the standard protocol during bolus administration of intravenous contrast. Multiplanar CT image reconstructions and MIPs were obtained to evaluate the vascular anatomy. CONTRAST:  100mL OMNIPAQUE IOHEXOL 350 MG/ML SOLN COMPARISON:  Chest CT August 09, 2013; chest radiograph December 21, 2014 FINDINGS: There is no demonstrable pulmonary embolus. Ascending aorta borderline prominent at 4.0 x 3.6 cm diameter. There is no thoracic dissection. Aorta is somewhat tortuous. Visualized great vessels appear unremarkable. There is mild patchy atelectasis in the right lower lobe. There is no frank edema or consolidation. The  right lobe of the thyroid appears somewhat inhomogeneous but stable. The left lobe of the thyroid appears normal. There is no appreciable thoracic adenopathy. The pericardium is not thickened. There is mild left  ventricular hypertrophy. There is a focal hiatal hernia. In the visualized upper abdomen, there is incomplete visualization of a noncystic mass arising from the posterior upper pole right kidney measuring 2.4 x 2.2 cm. There is incomplete visualization of a cyst arising from the mid right kidney measuring 1 x 1 cm. Visualized upper abdominal structures otherwise appear unremarkable. There is stable anterior wedging of mid thoracic vertebral bodies. No blastic or lytic bone lesions are appreciable. Review of the MIP images confirms the above findings. IMPRESSION: No demonstrable pulmonary embolus. No edema or consolidation.  Patchy atelectasis right lower lobe. Borderline prominent ascending thoracic aorta. Recommend annual imaging followup by CTA or MRA. This recommendation follows 2010 ACCF/AHA/AATS/ACR/ASA/SCA/SCAI/SIR/STS/SVM Guidelines for the Diagnosis and Management of Patients with Thoracic Aortic Disease. Circulation. 2010; 121: H086-V784 Hiatal hernia present. No thoracic adenopathy. Incomplete visualization of noncystic appearing mass from right kidney posteriorly upper pole region. This finding warrants further evaluation. Advise either pre and post-contrast renal MR or renal CT to further evaluate. Renal MR would be the optimum imaging study of choice for further assessment. Electronically Signed   By: Bretta Bang III M.D.   On: 12/21/2014 21:24   I have personally reviewed and evaluated these images and lab results as part of my medical decision-making.   EKG Interpretation   Date/Time:  Monday December 21 2014 18:46:32 EST Ventricular Rate:  93 PR Interval:  256 QRS Duration: 100 QT Interval:  375 QTC Calculation: 466 R Axis:   -72 Text Interpretation:  Sinus rhythm  Prolonged PR interval No significant  change since last tracing Confirmed by [facility] MD, Evaleen Sant ([id number]) on  12/21/2014 7:12:40 PM      CRITICAL CARE: hypotension Performed by: Rhae Lerner   Total critical care time: 30 minutes  Critical care time was exclusive of separately billable procedures and treating other patients.  Critical care was necessary to treat or prevent imminent or life-threatening deterioration.  Critical care was time spent personally by me on the following activities: development of treatment plan with patient and/or surrogate as well as nursing, discussions with consultants, evaluation of patient's response to treatment, examination of patient, obtaining history from patient or surrogate, ordering and performing treatments and interventions, ordering and review of laboratory studies, ordering and review of radiographic studies, pulse oximetry and re-evaluation of patient's condition.  MDM   Final diagnoses:  None   64 year old male with a history of hypertension, alcohol dependence, bipolar, coronary artery disease, hyperlipidemia who presents with concern for dull chest pain and diarrhea which began this morning.  Patient hypotensive on arrival, however with strong bilateral upper and lower ext pulses.  I-STAT labs were obtained and emergent CT a dissection protocol was ordered. This showed signs of thoracic aneurysm, however no signs of dissection, PE or large pericardial effusion. Troponin was negative, and EKG without new findings, giving lower suspicion for ACS.    Initial concern given patient reporting 15 episodes of diarrhea today is for possible hypovolemia as cause of patient's hypotension. He denied any other infectious symptoms, but overall x-ray and CT are not concerning for pneumonia. Hgb 11 and no hx of melena or rectal bleeding.  Patient was given 2 L of normal saline with initial improvement followed by continued hypotension. A third  liter of normal saline was then given. His lactic acid is within normal limits, he is mentating well and feel he is appropriate for stepdown unit care.   Patient denies toxic ingestions, possibility of withdrawal, or suicide attempt, however  later in emergency department stay reports he is having suicidal ideation. Patient with significant hyponatremia, however given concern for dehydration and hypovolemia, he was given multiple doses of normal saline to maintain blood pressures.  Pt admitted to hospitalist for further care.  Alvira Monday, MD 12/22/14 [phone]

## 2014-12-21 NOTE — ED Notes (Signed)
Staffing aware of need for sitter 

## 2014-12-22 ENCOUNTER — Encounter (HOSPITAL_COMMUNITY): Payer: Self-pay | Admitting: Internal Medicine

## 2014-12-22 LAB — BASIC METABOLIC PANEL
Anion gap: 9 (ref 5–15)
Anion gap: 9 (ref 5–15)
Anion gap: 8 (ref 5–15)
BUN: 10 mg/dL (ref 6–20)
BUN: 9 mg/dL (ref 6–20)
BUN: 6 mg/dL (ref 6–20)
CALCIUM: 7.6 mg/dL — AB (ref 8.9–10.3)
CALCIUM: 7.6 mg/dL — AB (ref 8.9–10.3)
CALCIUM: 8 mg/dL — AB (ref 8.9–10.3)
CO2: 20 mmol/L — ABNORMAL LOW (ref 22–32)
CO2: 20 mmol/L — ABNORMAL LOW (ref 22–32)
CO2: 21 mmol/L — AB (ref 22–32)
CREATININE: 1.27 mg/dL — AB (ref 0.61–1.24)
CREATININE: 1.12 mg/dL (ref 0.61–1.24)
CREATININE: 1.04 mg/dL (ref 0.61–1.24)
Chloride: 99 mmol/L — ABNORMAL LOW (ref 101–111)
Chloride: 97 mmol/L — ABNORMAL LOW (ref 101–111)
Chloride: 98 mmol/L — ABNORMAL LOW (ref 101–111)
GFR calc non Af Amer: >60 mL/min (ref >60)
GFR calc non Af Amer: >60 mL/min (ref >60)
GFR, EST NON AFRICAN AMERICAN: 58 mL/min — AB (ref >60)
Glucose, Bld: 116 mg/dL — ABNORMAL HIGH (ref 65–99)
Glucose, Bld: 158 mg/dL — ABNORMAL HIGH (ref 65–99)
Glucose, Bld: 66 mg/dL (ref 65–99)
Potassium: 3.2 mmol/L — ABNORMAL LOW (ref 3.5–5.1)
Potassium: 3.1 mmol/L — ABNORMAL LOW (ref 3.5–5.1)
Potassium: 3.7 mmol/L (ref 3.5–5.1)
SODIUM: 128 mmol/L — AB (ref 135–145)
SODIUM: 126 mmol/L — AB (ref 135–145)
SODIUM: 127 mmol/L — AB (ref 135–145)

## 2014-12-22 LAB — HEPATIC FUNCTION PANEL
ALBUMIN: 2.7 g/dL — AB (ref 3.5–5.0)
ALT: 10 U/L — ABNORMAL LOW (ref 17–63)
AST: 20 U/L (ref 15–41)
Alkaline Phosphatase: 61 U/L (ref 38–126)
BILIRUBIN TOTAL: 0.1 mg/dL — AB (ref 0.3–1.2)
Bilirubin, Direct: <0.1 mg/dL — ABNORMAL LOW (ref 0.1–0.5)
TOTAL PROTEIN: 5 g/dL — AB (ref 6.5–8.1)

## 2014-12-22 LAB — CBC WITH DIFFERENTIAL/PLATELET
Basophils Absolute: 0 10*3/uL (ref 0.0–0.1)
Basophils Relative: 0 %
Eosinophils Absolute: 0 10*3/uL (ref 0.0–0.7)
Eosinophils Relative: 1 %
HCT: 29.2 % — ABNORMAL LOW (ref 39.0–52.0)
HEMOGLOBIN: 9.3 g/dL — AB (ref 13.0–17.0)
Lymphocytes Relative: 34 %
Lymphs Abs: 1.3 10*3/uL (ref 0.7–4.0)
MCH: 25.6 pg — AB (ref 26.0–34.0)
MCHC: 31.8 g/dL (ref 30.0–36.0)
MCV: 80.4 fL (ref 78.0–100.0)
MONOS PCT: 12 %
Monocytes Absolute: 0.4 10*3/uL (ref 0.1–1.0)
NEUTROS ABS: 2 10*3/uL (ref 1.7–7.7)
NEUTROS PCT: 53 %
Platelets: 231 10*3/uL (ref 150–400)
RBC: 3.63 MIL/uL — AB (ref 4.22–5.81)
RDW: 16.9 % — ABNORMAL HIGH (ref 11.5–15.5)
WBC: 3.8 10*3/uL — AB (ref 4.0–10.5)

## 2014-12-22 LAB — OSMOLALITY: OSMOLALITY: 273 mosm/kg — AB (ref 275–295)

## 2014-12-22 LAB — PROCALCITONIN: PROCALCITONIN: 0.16 ng/mL

## 2014-12-22 LAB — PROTIME-INR
INR: 1.2 (ref 0.00–1.49)
Prothrombin Time: 15.4 seconds — ABNORMAL HIGH (ref 11.6–15.2)

## 2014-12-22 LAB — TSH: TSH: 0.395 u[IU]/mL (ref 0.350–4.500)

## 2014-12-22 LAB — CORTISOL: Cortisol, Plasma: 12.2 ug/dL

## 2014-12-22 LAB — TROPONIN I: Troponin I: <0.03 ng/mL (ref <0.031)

## 2014-12-22 LAB — MRSA PCR SCREENING: MRSA by PCR: NEGATIVE

## 2014-12-22 LAB — ETHANOL: Alcohol, Ethyl (B): <5 mg/dL (ref <5)

## 2014-12-22 LAB — LACTIC ACID, PLASMA: LACTIC ACID, VENOUS: 1.7 mmol/L (ref 0.5–2.0)

## 2014-12-22 MED ORDER — ONDANSETRON HCL 4 MG PO TABS: 4.0000 mg | ORAL_TABLET | Freq: Four times a day (QID) | ORAL | Status: DC | PRN

## 2014-12-22 MED ORDER — INFLUENZA VAC SPLIT QUAD 0.5 ML IM SUSY
0.5000 mL | PREFILLED_SYRINGE | INTRAMUSCULAR | Status: AC
  Administered 2014-12-23: 0.5 mL via INTRAMUSCULAR
  Filled 2014-12-22: qty 0.5

## 2014-12-22 MED ORDER — BUPROPION HCL ER (XL) 300 MG PO TB24
300.0000 mg | ORAL_TABLET | Freq: Every day | ORAL | Status: DC
  Administered 2014-12-22 – 2014-12-23 (×2): 300 mg via ORAL
  Filled 2014-12-22 (×3): qty 1

## 2014-12-22 MED ORDER — VITAMIN B-1 100 MG PO TABS
100.0000 mg | ORAL_TABLET | Freq: Every day | ORAL | Status: DC
Start: 2014-12-22 — End: 2014-12-23
  Administered 2014-12-22 – 2014-12-23 (×2): 100 mg via ORAL
  Filled 2014-12-22 (×2): qty 1

## 2014-12-22 MED ORDER — POTASSIUM CHLORIDE CRYS ER 20 MEQ PO TBCR
40.0000 meq | EXTENDED_RELEASE_TABLET | Freq: Four times a day (QID) | ORAL | Status: AC
  Administered 2014-12-22 (×2): 40 meq via ORAL
  Filled 2014-12-22 (×2): qty 2

## 2014-12-22 MED ORDER — GI COCKTAIL ~~LOC~~: 30.0000 mL | Freq: Three times a day (TID) | ORAL | Status: DC | PRN

## 2014-12-22 MED ORDER — FOLIC ACID 1 MG PO TABS
1.0000 mg | ORAL_TABLET | Freq: Every day | ORAL | Status: DC
  Administered 2014-12-22 – 2014-12-23 (×2): 1 mg via ORAL
  Filled 2014-12-22 (×2): qty 1

## 2014-12-22 MED ORDER — SODIUM CHLORIDE 0.9 % IV SOLN
INTRAVENOUS | Status: AC
  Administered 2014-12-22: 07:00:00 via INTRAVENOUS
  Administered 2014-12-22: 100 mL/h via INTRAVENOUS
  Administered 2014-12-22: 01:00:00 via INTRAVENOUS

## 2014-12-22 MED ORDER — ATORVASTATIN CALCIUM 40 MG PO TABS
40.0000 mg | ORAL_TABLET | Freq: Every day | ORAL | Status: DC
  Administered 2014-12-22 – 2014-12-23 (×2): 40 mg via ORAL
  Filled 2014-12-22 (×3): qty 1

## 2014-12-22 MED ORDER — PANTOPRAZOLE SODIUM 40 MG PO TBEC
40.0000 mg | DELAYED_RELEASE_TABLET | Freq: Every day | ORAL | Status: DC
  Administered 2014-12-22 – 2014-12-23 (×2): 40 mg via ORAL
  Filled 2014-12-22 (×2): qty 1

## 2014-12-22 MED ORDER — QUETIAPINE FUMARATE 100 MG PO TABS
200.0000 mg | ORAL_TABLET | Freq: Every day | ORAL | Status: DC
  Administered 2014-12-22 (×2): 200 mg via ORAL
  Filled 2014-12-22: qty 1
  Filled 2014-12-22: qty 2

## 2014-12-22 MED ORDER — ACETAMINOPHEN 650 MG RE SUPP: 650.0000 mg | Freq: Four times a day (QID) | RECTAL | Status: DC | PRN

## 2014-12-22 MED ORDER — ONDANSETRON HCL 4 MG/2ML IJ SOLN
4.0000 mg | Freq: Four times a day (QID) | INTRAMUSCULAR | Status: DC | PRN
Start: 2014-12-22 — End: 2014-12-23

## 2014-12-22 MED ORDER — NITROGLYCERIN 0.4 MG SL SUBL: 0.4000 mg | SUBLINGUAL_TABLET | SUBLINGUAL | Status: DC | PRN

## 2014-12-22 MED ORDER — ADULT MULTIVITAMIN W/MINERALS CH
1.0000 | ORAL_TABLET | Freq: Every day | ORAL | Status: DC
  Administered 2014-12-22 – 2014-12-23 (×2): 1 via ORAL
  Filled 2014-12-22 (×2): qty 1

## 2014-12-22 MED ORDER — ACETAMINOPHEN 325 MG PO TABS
650.0000 mg | ORAL_TABLET | Freq: Four times a day (QID) | ORAL | Status: DC | PRN
  Administered 2014-12-22 (×2): 650 mg via ORAL
  Filled 2014-12-22 (×2): qty 2

## 2014-12-22 MED ORDER — SODIUM CHLORIDE 0.9 % IV BOLUS (SEPSIS)
500.0000 mL | Freq: Once | INTRAVENOUS | Status: AC
  Administered 2014-12-22: 500 mL via INTRAVENOUS

## 2014-12-22 MED ORDER — ENOXAPARIN SODIUM 40 MG/0.4ML ~~LOC~~ SOLN
40.0000 mg | Freq: Every day | SUBCUTANEOUS | Status: DC
  Administered 2014-12-22 – 2014-12-23 (×2): 40 mg via SUBCUTANEOUS
  Filled 2014-12-22 (×3): qty 0.4

## 2014-12-22 MED ORDER — LORAZEPAM 1 MG PO TABS
1.0000 mg | ORAL_TABLET | Freq: Four times a day (QID) | ORAL | Status: DC | PRN
Start: 2014-12-22 — End: 2014-12-23

## 2014-12-22 MED ORDER — ASPIRIN EC 325 MG PO TBEC
325.0000 mg | DELAYED_RELEASE_TABLET | Freq: Every morning | ORAL | Status: DC
Start: 2014-12-22 — End: 2014-12-23
  Administered 2014-12-22 – 2014-12-23 (×2): 325 mg via ORAL
  Filled 2014-12-22 (×2): qty 1

## 2014-12-22 MED ORDER — THIAMINE HCL 100 MG/ML IJ SOLN
100.0000 mg | Freq: Every day | INTRAMUSCULAR | Status: DC
Start: 2014-12-22 — End: 2014-12-23

## 2014-12-22 MED ORDER — LORAZEPAM 1 MG PO TABS
1.0000 mg | ORAL_TABLET | Freq: Three times a day (TID) | ORAL | Status: DC
  Administered 2014-12-22 – 2014-12-23 (×5): 1 mg via ORAL
  Filled 2014-12-22 (×4): qty 1

## 2014-12-22 MED ORDER — LORAZEPAM 2 MG/ML IJ SOLN: 1.0000 mg | Freq: Four times a day (QID) | INTRAMUSCULAR | Status: DC | PRN

## 2014-12-22 NOTE — H&P (Addendum)
[facility] History and Physical  SI JACHIM XLK:[id number] DOB: 1950/06/07 DOA: 12/21/2014  Referring physician: Dr.Scholsmann. PCP: Thayer Headings, MD  Specialists: None.  Chief Complaint: Nausea vomiting and diarrhea.  HPI: DJIMON LUNDSTROM is a 64 y.o. male with a history of hypertension, hyperlipidemia, bipolar disorder who was admitted 3 days ago for nausea vomiting diarrhea chest pain and hyponatremia presents with similar complaints. Patient states his symptoms started yesterday morning with nausea vomiting and diarrhea which were multiple episodes denies any blood in the vomitus or diarrhea. Subsequently patient started developing epigastric and chest pain. Chest pain is persistent nonradiating present even at rest. In the ER CT and exam of the chest was done to rule out dissection which was negative. Patient also was found to be hypotensive for which third liter fluid boluse has been given. Patient's sodium is also low at around 118. Patient states his last alcohol drink was 4-5 days ago. Denies any shortness of breath fever chills or sick contacts or recent travel. Patient has been admitted for further management of his hyponatremia hypotension chest pain nausea vomiting and diarrhea.   Review of Systems: As presented in the history of presenting illness, rest negative.  Past Medical History  Diagnosis Date  . MVP (mitral valve prolapse)   . Anxiety   . Hypertension   . Hyperlipidemia   . Palpitations   . Bipolar disorder (HCC)   . Depression   . Esophagitis   . Gastritis   . Insomnia   . Hypercholesteremia   . Coronary artery disease     a.  s/p PTCA to PL branch of RCA in 2007;  b.  Last LHC 04/2008:  LM with irregularities, LAD 50%, mid LAD 50%, proximal D1 80-90%, proximal OM1 30-40%, proximal OM2 50%, PLB branch 30-40%, site of prior angioplasty in the RCA widely patent, EF 65%=>Med Rx;  c. Nuclear study a 12/2011: EF 71% and normal perfusion.    Marland Kitchen History of  echocardiogram     Echo 08/16/09: Mild LVH, EF 55-60%, no normal wall motion, grade 1 diastolic dysfunction  . Anginal pain (HCC)   . Pneumonia 2005  . GERD (gastroesophageal reflux disease)   . Arthritis     "hands; especially in cold weather" (12/11/2013)  . Alcohol related seizure (HCC)     "when I go off it"   Past Surgical History  Procedure Laterality Date  . Coronary angioplasty  2007    RCA  . Cardiac catheterization  04/2008    EF 65%  . Knee arthroscopy, medial patello femoral ligament repair    . Hip arthroplasty Bilateral     "both replaced twice"   . Total shoulder replacement Right   . Total hip revision  02/07/2012    Procedure: TOTAL HIP REVISION;  Surgeon: [name], MD;  Location: [facility] OR;  Service: Orthopedics;  Laterality: Left;  [place] Hip Revision Set  . Total knee arthroplasty Right   . Appendectomy  1958  . Tonsillectomy  1958  . Joint replacement     Social History:  reports that he has been smoking Cigarettes.  He has a 20 pack-year smoking history. He has never used smokeless tobacco. He reports that he drinks alcohol. He reports that he uses illicit drugs (Benzodiazepines) about 7 times per week. Where does patient live home. Can patient participate in ADLs? Yes.  Allergies  Allergen Reactions  . Ambien [Zolpidem Tartrate] Other (See Comments)    Sleep Walking  . Codeine Itching  .  Promethazine     Makes me sick     Family History:  Family History  Problem Relation Age of Onset  . Heart disease Neg Hx   . Prostate cancer Father       Prior to Admission medications   Medication Sig Start Date End Date Taking? Authorizing Provider  acetaminophen (TYLENOL) 325 MG tablet Take 2 tablets (650 mg total) by mouth every 6 (six) hours as needed for mild pain, moderate pain, fever or headache (or Fever >/= 101). 11/28/14  Yes Elease EtienneAnand D Hongalgi, MD  aspirin EC 325 MG tablet Take 1 tablet (325 mg total) by mouth every morning. For blood thinner for  heart health 04/02/14  Yes Alison MurrayAlma M Devine, MD  atorvastatin (LIPITOR) 40 MG tablet Take 1 tablet (40 mg total) by mouth daily. For cholesterol 10/24/12  Yes Sanjuana KavaAgnes I Nwoko, NP  buPROPion (WELLBUTRIN XL) 300 MG 24 hr tablet Take 1 tablet (300 mg total) by mouth daily. For depression 10/24/12  Yes Sanjuana KavaAgnes I Nwoko, NP  lisinopril (PRINIVIL,ZESTRIL) 20 MG tablet Take 20 mg by mouth daily.   Yes Historical Provider, MD  LORazepam (ATIVAN) 1 MG tablet Take 1 mg by mouth 3 (three) times daily. Take 3 tablets by mouth every day per patient   Yes Historical Provider, MD  metoprolol tartrate (LOPRESSOR) 25 MG tablet Take 25 mg by mouth at bedtime.   Yes Historical Provider, MD  nicotine (NICODERM CQ - DOSED IN MG/24 HOURS) 21 mg/24hr patch Place 1 patch (21 mg total) onto the skin daily. 11/28/14  Yes Elease EtienneAnand D Hongalgi, MD  nitroGLYCERIN (NITROSTAT) 0.4 MG SL tablet Place 1 tablet (0.4 mg total) under the tongue every 5 (five) minutes as needed for chest pain (CP or SOB). 10/24/12  Yes Sanjuana KavaAgnes I Nwoko, NP  ondansetron (ZOFRAN ODT) 4 MG disintegrating tablet Take 1 tablet (4 mg total) by mouth every 8 (eight) hours as needed for nausea. 12/03/14  Yes Mady GemmaElizabeth C Westfall, PA-C  pantoprazole (PROTONIX) 40 MG tablet Take 1 tablet (40 mg total) by mouth daily. Patient taking differently: Take 40 mg by mouth daily. Prn 11/28/14  Yes Elease EtienneAnand D Hongalgi, MD  QUEtiapine (SEROQUEL) 200 MG tablet Take 200 mg by mouth at bedtime.   Yes Historical Provider, MD  chlordiazePOXIDE (LIBRIUM) 25 MG capsule 50mg  PO TID x 1D, then 25-50mg  PO BID X 1D, then 25-50mg  PO QD X 1D Patient not taking: Reported on 12/21/2014 12/03/14   Mady GemmaElizabeth C Westfall, PA-C  folic acid (FOLVITE) 1 MG tablet Take 1 tablet (1 mg total) by mouth daily. Patient not taking: Reported on 12/21/2014 11/28/14   Elease EtienneAnand D Hongalgi, MD  Multiple Vitamin (MULTIVITAMIN WITH MINERALS) TABS tablet Take 1 tablet by mouth daily. Patient not taking: Reported on 12/21/2014 11/28/14    Elease EtienneAnand D Hongalgi, MD  QUEtiapine (SEROQUEL XR) 50 MG TB24 24 hr tablet Take 2 tablets (100 mg total) by mouth at bedtime. Patient not taking: Reported on 12/03/2014 07/19/14   Maretta BeesShanker M Ghimire, MD  thiamine 100 MG tablet Take 1 tablet (100 mg total) by mouth daily. Patient not taking: Reported on 12/03/2014 11/28/14   Elease EtienneAnand D Hongalgi, MD    Physical Exam: Filed Vitals:   12/21/14 2230 12/21/14 2245 12/21/14 2300 12/21/14 2350  BP: 95/67 101/63 114/71 102/67  Pulse: 88 89 43 95  Temp:      TempSrc:      Resp: 16 18 16 16   Height:      Weight:  SpO2: 100% 100% 96% 99%     General:  Moderately built and nourished.  Eyes: Anicteric no pallor.  ENT: No discharge from the ears eyes nose and mouth.  Neck: No mass felt. No JVD appreciated.  Cardiovascular: S1-S2 heard.  Respiratory: No gross or crepitations.  Abdomen: Soft mild epigastric tenderness no guarding or rigidity.  Skin: No rash.  Musculoskeletal: No edema.  Psychiatric: Appears normal.  Neurologic: Alert awake oriented to time place and person. Moves all extremities.  Labs on Admission:  Basic Metabolic Panel:  Recent Labs Lab 12/21/14 1900 12/21/14 1921  NA 118* 119*  K 3.6 3.6  CL 85* 89*  CO2 17*  --   GLUCOSE 66 65  BUN 15 16  CREATININE 1.50* 1.50*  CALCIUM 8.1*  --    Liver Function Tests:  Recent Labs Lab 12/21/14 1900  AST 22  ALT 11*  ALKPHOS 78  BILITOT 0.4  PROT 6.3*  ALBUMIN 3.3*   No results for input(s): LIPASE, AMYLASE in the last 168 hours. No results for input(s): AMMONIA in the last 168 hours. CBC:  Recent Labs Lab 12/21/14 1900 12/21/14 1921  WBC 5.5  --   HGB 11.0* 13.6  HCT 34.4* 40.0  MCV 79.8  --   PLT 273  --    Cardiac Enzymes: No results for input(s): CKTOTAL, CKMB, CKMBINDEX, TROPONINI in the last 168 hours.  BNP (last 3 results)  Recent Labs  03/30/14 2346 05/22/14 1902 06/12/14 2338  BNP 64.6 61.5 75.8    ProBNP (last 3 results) No  results for input(s): PROBNP in the last 8760 hours.  CBG: No results for input(s): GLUCAP in the last 168 hours.  Radiological Exams on Admission: Dg Chest Portable 1 View  12/21/2014  CLINICAL DATA:  64 year old male with acute chest pain and shortness of breath today. EXAM: PORTABLE CHEST 1 VIEW COMPARISON:  12/03/2014 and prior chest radiographs FINDINGS: Cardiomegaly identified. There is no evidence of focal airspace disease, pulmonary edema, suspicious pulmonary nodule/mass, pleural effusion, or pneumothorax. No acute bony abnormalities are identified. Right shoulder hemiarthroplasty again noted. IMPRESSION: Cardiomegaly without evidence of acute cardiopulmonary disease. Electronically Signed   By: Harmon Pier M.D.   On: 12/21/2014 19:32   Ct Angio Chest Aorta W/cm &/or Wo/cm  12/21/2014  CLINICAL DATA:  Chest pain and shortness of Breath EXAM: CT ANGIOGRAPHY CHEST WITH CONTRAST TECHNIQUE: Multidetector CT imaging of the chest was performed using the standard protocol during bolus administration of intravenous contrast. Multiplanar CT image reconstructions and MIPs were obtained to evaluate the vascular anatomy. CONTRAST:  OMNIPAQUE IOHEXOL 350 MG/ML SOLN COMPARISON:  Chest CT August 09, 2013; chest radiograph December 21, 2014 FINDINGS: There is no demonstrable pulmonary embolus. Ascending aorta borderline prominent at 4.0 x 3.6 cm diameter. There is no thoracic dissection. Aorta is somewhat tortuous. Visualized great vessels appear unremarkable. There is mild patchy atelectasis in the right lower lobe. There is no frank edema or consolidation. The right lobe of the thyroid appears somewhat inhomogeneous but stable. The left lobe of the thyroid appears normal. There is no appreciable thoracic adenopathy. The pericardium is not thickened. There is mild left ventricular hypertrophy. There is a focal hiatal hernia. In the visualized upper abdomen, there is incomplete visualization of a noncystic  mass arising from the posterior upper pole right kidney measuring 2.4 x 2.2 cm. There is incomplete visualization of a cyst arising from the mid right kidney measuring 1 x 1 cm. Visualized upper abdominal structures  otherwise appear unremarkable. There is stable anterior wedging of mid thoracic vertebral bodies. No blastic or lytic bone lesions are appreciable. Review of the MIP images confirms the above findings. IMPRESSION: No demonstrable pulmonary embolus. No edema or consolidation.  Patchy atelectasis right lower lobe. Borderline prominent ascending thoracic aorta. Recommend annual imaging followup by CTA or MRA. This recommendation follows 2010 ACCF/AHA/AATS/ACR/ASA/SCA/SCAI/SIR/STS/SVM Guidelines for the Diagnosis and Management of Patients with Thoracic Aortic Disease. Circulation. 2010; 121: Z610-R604 Hiatal hernia present. No thoracic adenopathy. Incomplete visualization of noncystic appearing mass from right kidney posteriorly upper pole region. This finding warrants further evaluation. Advise either pre and post-contrast renal MR or renal CT to further evaluate. Renal MR would be the optimum imaging study of choice for further assessment. Electronically Signed   By: Bretta Bang III M.D.   On: 12/21/2014 21:24    EKG: Independently reviewed. Normal sinus rhythm.  Assessment/Plan Principal Problem:   Hyponatremia Active Problems:   HTN (hypertension)   Chest pain   Hypotension   Nausea vomiting and diarrhea   1. Nausea vomiting and diarrhea with epigastric and chest pain - could be gastroenteritis. Check GI pathogen panel. We'll also closely monitor LFTs and lipase since patient has history of alcoholism. Presently abdomen appears benign except for mild epigastric tenderness. CT angiogram of the chest did not show anything acute. Closely observe. 2. Hyponatremia - probably a combination of dehydration from diarrhea and vomiting and SIADH. But since patient is hypotensive patient is  receiving fluid boluses. Check cortisol levels and TSH and closely follow metabolic panel and urine sodium and osmolality is pending. 3. Chest pain - appears atypical. CT angiogram of the chest is negative for dissection but did show some prominent ascending aorta. This will need follow-up. At this time we will cycle cardiac markers. Chest pain probably from vomiting. 4. Hypotension - does not appear septic. We'll closely monitor lactic acid levels and check cortisol levels. Hypotension could be from diarrhea. Continue hydration. Hold antihypertensives. 5. Acute renal failure - probably from hypotension dehydration and medications. Holding antihypertensives. Continue with hydration. Follow intake and output and metabolic panel. 6. Hypertension present hypotensive holding antihypertensives. 7. Bipolar disorder - will continue home medications. 8. History of alcoholism - patient has not had any alcohol for last 4-5 days. Patient is on CIWA protocol with when necessary Ativan. 9. Chronic anemia - follow CBC.  I have reviewed patient's old charts and labs.   DVT Prophylaxis Lovenox.  Code Status: Full code.  Family Communication: Discussed with patient.  Disposition Plan: Admit to inpatient.    KAKRAKANDY,ARSHAD N. [facility] Pager [phone].  If 7PM-7AM, please contact night-coverage [email or web address] Password [email or web address] 12/22/2014, 12:18 AM

## 2014-12-22 NOTE — Progress Notes (Signed)
[facility] PROGRESS NOTE  SAI ZINN ZOX:[id number] DOB: 1950-09-17 DOA: 12/21/2014 PCP: Thayer Headings, MD  Interim summary [name] is a pleasant 64 year old gentleman with history of hypertension, dyslipidemia, bipolar disorder, presenting to the emergency department overnight with complaints of nausea, vomiting, diarrhea 3 days. He stated having a friend with similar symptoms. He denies recent travel or eating rare/undercooked meat. Patient reporting developing severe retrosternal chest pain in the last 24 hours which prompted him to come to the emergency room. Workup in the emergency department included a CTA that did not show evidence of dissection. Labs revealed sodium of 128 with potassium of 3.3 and creatinine 1.27. Symptoms felt to be related to infectious gastroenteritis, started on IV fluid resuscitation.   Assessment/Plan: 1. Suspected viral syndrome -[name] is a 64 year old gentleman presenting with complaints of nausea, vomiting, diarrhea for the past 3 days reporting sick contact. -He denies hematemesis or bloody stools. -Lab data consistent with dehydration -Bladder continue IV fluid resuscitation with normal saline -Follow-up on stool cultures. -Advance diet as tolerated. -As needed IV anti-emedic therapy   2.  Hyponatremia. -Hyponatremia likely secondary to hypotonic hypovolemia in setting of GI losses from probable viral syndrome. There is a likely SIADH component as well, having history of hyponatremia with serum sodium's in the 120s to 130 -Labs this morning showing sodium of 126. -Will continue IV fluid resuscitation  3.  Hypokalemia. -Labs show a potassium of 3.1 likely secondary to GI losses -We'll provide potassium replacement with Kdur.   4.   Depression. -Patient reporting losing his brother last year 2 weeks prior to Christmas and has been feeling depressed lately. He denies suicidal or homicidal ideations, stating he does not want to hurt himself.  He feels that going home would be unsafe for him at this point. Will D/C one-to-one sitter.  5.  Chest pain. -Patient reported that his chest pain started after multiple episodes of nausea vomiting and retching -I suspect chest pain is of GI origin. CT angiogram gram of chest was negative for dissection. -Will follow-up on troponins. -Provide Protonix and GI cocktail as needed  Code Status: Full code  Family Communication: family not present  Disposition Plan: patient admitted this morning   HPI/Subjective: Reports feeling better states he is hungry and would like to have breakfast  Objective: Filed Vitals:   12/22/14 0658 12/22/14 0700  BP: 87/63 85/58  Pulse: 80 79  Temp: 99.5 F (37.5 C)   Resp:  15    Intake/Output Summary (Last 24 hours) at 12/22/14 0806 Last data filed at 12/22/14 0700  Gross per 24 hour  Intake   2100 ml  Output   2000 ml  Net    100 ml   Filed Weights   12/21/14 1849 12/22/14 0647  Weight: 77.111 kg (170 lb) 78.6 kg (173 lb 4.5 oz)    Exam:   General:  States feeling better, no acute distress  Cardiovascular: RRR normal S1S2, no extremity edema  Respiratory: Normal respiratory effort lungs are clear bilaterally   Abdomen: Having mild generalized pain to palpation across abdomen, no masses, rebound tenderness or guarding   Musculoskeletal: no edema  Data Reviewed: Basic Metabolic Panel:  Recent Labs Lab 12/21/14 1900 12/21/14 1921 12/22/14 0221 12/22/14 0521  NA 118* 119* 128* 126*  K 3.6 3.6 3.2* 3.1*  CL 85* 89* 99* 97*  CO2 17*  --  20* 20*  GLUCOSE 66 65 116* 158*  BUN CREATININE 1.50* 1.50*  1.27* 1.12  CALCIUM 8.1*  --  7.6* 7.6*   Liver Function Tests:  Recent Labs Lab 12/21/14 1900 12/22/14 0221  AST 22 20  ALT 11* 10*  ALKPHOS 78 61  BILITOT 0.4 0.1*  PROT 6.3* 5.0*  ALBUMIN 3.3* 2.7*   No results for input(s): LIPASE, AMYLASE in the last 168 hours. No results for input(s): AMMONIA in the  last 168 hours. CBC:  Recent Labs Lab 12/21/14 1900 12/21/14 1921 12/22/14 0221 12/22/14 0521  WBC 5.5  --  4.3 3.8*  NEUTROABS  --   --   --  2.0  HGB 11.0* 13.6 9.8* 9.3*  HCT 34.4* 40.0 30.5* 29.2*  MCV 79.8  --  80.9 80.4  PLT 273  --  224 231   Cardiac Enzymes:  Recent Labs Lab 12/22/14 0221  TROPONINI <0.03   BNP (last 3 results)  Recent Labs  03/30/14 2346 05/22/14 1902 06/12/14 2338  BNP 64.6 61.5 75.8    ProBNP (last 3 results) No results for input(s): PROBNP in the last 8760 hours.  CBG: No results for input(s): GLUCAP in the last 168 hours.  No results found for this or any previous visit (from the past 240 hour(s)).   Studies: Dg Chest Portable 1 View  12/21/2014  CLINICAL DATA:  64 year old male with acute chest pain and shortness of breath today. EXAM: PORTABLE CHEST 1 VIEW COMPARISON:  12/03/2014 and prior chest radiographs FINDINGS: Cardiomegaly identified. There is no evidence of focal airspace disease, pulmonary edema, suspicious pulmonary nodule/mass, pleural effusion, or pneumothorax. No acute bony abnormalities are identified. Right shoulder hemiarthroplasty again noted. IMPRESSION: Cardiomegaly without evidence of acute cardiopulmonary disease. Electronically Signed   By: Harmon PierJeffrey  Hu M.D.   On: 12/21/2014 19:32   Ct Angio Chest Aorta W/cm &/or Wo/cm  12/21/2014  CLINICAL DATA:  Chest pain and shortness of Breath EXAM: CT ANGIOGRAPHY CHEST WITH CONTRAST TECHNIQUE: Multidetector CT imaging of the chest was performed using the standard protocol during bolus administration of intravenous contrast. Multiplanar CT image reconstructions and MIPs were obtained to evaluate the vascular anatomy. CONTRAST:  100mL OMNIPAQUE IOHEXOL 350 MG/ML SOLN COMPARISON:  Chest CT August 09, 2013; chest radiograph December 21, 2014 FINDINGS: There is no demonstrable pulmonary embolus. Ascending aorta borderline prominent at 4.0 x 3.6 cm diameter. There is no thoracic  dissection. Aorta is somewhat tortuous. Visualized great vessels appear unremarkable. There is mild patchy atelectasis in the right lower lobe. There is no frank edema or consolidation. The right lobe of the thyroid appears somewhat inhomogeneous but stable. The left lobe of the thyroid appears normal. There is no appreciable thoracic adenopathy. The pericardium is not thickened. There is mild left ventricular hypertrophy. There is a focal hiatal hernia. In the visualized upper abdomen, there is incomplete visualization of a noncystic mass arising from the posterior upper pole right kidney measuring 2.4 x 2.2 cm. There is incomplete visualization of a cyst arising from the mid right kidney measuring 1 x 1 cm. Visualized upper abdominal structures otherwise appear unremarkable. There is stable anterior wedging of mid thoracic vertebral bodies. No blastic or lytic bone lesions are appreciable. Review of the MIP images confirms the above findings. IMPRESSION: No demonstrable pulmonary embolus. No edema or consolidation.  Patchy atelectasis right lower lobe. Borderline prominent ascending thoracic aorta. Recommend annual imaging followup by CTA or MRA. This recommendation follows 2010 ACCF/AHA/AATS/ACR/ASA/SCA/SCAI/SIR/STS/SVM Guidelines for the Diagnosis and Management of Patients with Thoracic Aortic Disease. Circulation. 2010; 121: Z610-[id number] Hiatal hernia present.  No thoracic adenopathy. Incomplete visualization of noncystic appearing mass from right kidney posteriorly upper pole region. This finding warrants further evaluation. Advise either pre and post-contrast renal MR or renal CT to further evaluate. Renal MR would be the optimum imaging study of choice for further assessment. Electronically Signed   By: Bretta Bang III M.D.   On: 12/21/2014 21:24    Scheduled Meds: . aspirin EC  325 mg Oral q morning - 10a  . atorvastatin  40 mg Oral Daily  . buPROPion  300 mg Oral Daily  . enoxaparin (LOVENOX)  injection  40 mg Subcutaneous Daily  . folic acid  1 mg Oral Daily  . [START ON 12/23/2014] Influenza vac split quadrivalent PF  0.5 mL Intramuscular Tomorrow-1000  . LORazepam  1 mg Oral TID  . multivitamin with minerals  1 tablet Oral Daily  . potassium chloride  40 mEq Oral Q6H  . QUEtiapine  200 mg Oral QHS  . thiamine  100 mg Oral Daily   Or  . thiamine  100 mg Intravenous Daily   Continuous Infusions: . sodium chloride 100 mL/hr at 12/22/14 1610    Principal Problem:   Hyponatremia Active Problems:   HTN (hypertension)   Chest pain   Hypotension   Nausea vomiting and diarrhea    Time spent:     Jeralyn Bennett  [facility] Pager [phone]. If 7PM-7AM, please contact night-coverage at [email or web address], password [facility] 12/22/2014, 8:06 AM  LOS: 1 day

## 2014-12-22 NOTE — ED Notes (Signed)
Paged provider to report pt's bp, he verbally ordered 500 NS bolus and 100/hr NS. He also requested that we release the lab orders which was done.

## 2014-12-22 NOTE — Care Management Note (Signed)
Case Management Note  Patient Details  Name: [name] MRN: [id number] Date of Birth: 02/13/1950  Subjective/Objective:  Adm w hyponatremia                  Action/Plan: lives alone, pcp dr Shary Decampbrian mckenzie   Expected Discharge Date:                  Expected Discharge Plan:     In-House Referral:     Discharge planning Services     Post Acute Care Choice:    Choice offered to:     DME Arranged:    DME Agency:     HH Arranged:    HH Agency:     Status of Service:     Medicare Important Message Given:    Date Medicare IM Given:    Medicare IM give by:    Date Additional Medicare IM Given:    Additional Medicare Important Message give by:     If discussed at Long Length of Stay Meetings, dates discussed:    Additional Comments: ur review done  Hanley HaysDowell, [name] Romanello T, RN 12/22/2014, 9:56 AM

## 2014-12-23 DIAGNOSIS — R112 Nausea with vomiting, unspecified: Secondary | ICD-10-CM

## 2014-12-23 DIAGNOSIS — R197 Diarrhea, unspecified: Secondary | ICD-10-CM

## 2014-12-23 DIAGNOSIS — E871 Hypo-osmolality and hyponatremia: Secondary | ICD-10-CM

## 2014-12-23 DIAGNOSIS — I959 Hypotension, unspecified: Secondary | ICD-10-CM | POA: Insufficient documentation

## 2014-12-23 DIAGNOSIS — A084 Viral intestinal infection, unspecified: Secondary | ICD-10-CM | POA: Diagnosis not present

## 2014-12-23 DIAGNOSIS — R079 Chest pain, unspecified: Secondary | ICD-10-CM

## 2014-12-23 LAB — CBC
HCT: 30.3 % — ABNORMAL LOW (ref 39.0–52.0)
HEMATOCRIT: 30.5 % — AB (ref 39.0–52.0)
Hemoglobin: 9.8 g/dL — ABNORMAL LOW (ref 13.0–17.0)
Hemoglobin: 9.6 g/dL — ABNORMAL LOW (ref 13.0–17.0)
MCH: 26 pg (ref 26.0–34.0)
MCH: 25.7 pg — ABNORMAL LOW (ref 26.0–34.0)
MCHC: 32.1 g/dL (ref 30.0–36.0)
MCHC: 31.7 g/dL (ref 30.0–36.0)
MCV: 80.9 fL (ref 78.0–100.0)
MCV: 81.2 fL (ref 78.0–100.0)
PLATELETS: 233 10*3/uL (ref 150–400)
Platelets: 224 10*3/uL (ref 150–400)
RBC: 3.77 MIL/uL — ABNORMAL LOW (ref 4.22–5.81)
RBC: 3.73 MIL/uL — AB (ref 4.22–5.81)
RDW: 17 % — AB (ref 11.5–15.5)
RDW: 17.1 % — ABNORMAL HIGH (ref 11.5–15.5)
WBC: 4.3 10*3/uL (ref 4.0–10.5)
WBC: 4.6 10*3/uL (ref 4.0–10.5)

## 2014-12-23 LAB — BASIC METABOLIC PANEL
Anion gap: 6 (ref 5–15)
BUN: <5 mg/dL — ABNORMAL LOW (ref 6–20)
CALCIUM: 8.6 mg/dL — AB (ref 8.9–10.3)
CHLORIDE: 104 mmol/L (ref 101–111)
CO2: 21 mmol/L — ABNORMAL LOW (ref 22–32)
CREATININE: 0.89 mg/dL (ref 0.61–1.24)
GFR calc non Af Amer: >60 mL/min (ref >60)
Glucose, Bld: 89 mg/dL (ref 65–99)
Potassium: 4.1 mmol/L (ref 3.5–5.1)
SODIUM: 131 mmol/L — AB (ref 135–145)

## 2014-12-23 NOTE — Discharge Summary (Signed)
Physician Discharge Summary  [name] ZOX:[id number] DOB: 03-Apr-1950 DOA: 12/21/2014  PCP: [name] Headings, MD  Admit date: 12/21/2014 Discharge date: 12/23/2014  Time spent: 45 minutes  Recommendations for Outpatient Follow-up:  1. PCP Dr.Mackenzie in 1 week   Discharge Diagnoses:    Gastroenteritis   Hyponatremia   HTN (hypertension)   Chest pain   Hypotension   Nausea vomiting and diarrhea   Pain in the chest   Arterial hypotension   Discharge Condition: stable  Diet recommendation: heart healthy  Filed Weights   12/21/14 1849 12/22/14 0647  Weight: 77.111 kg (170 lb) 78.6 kg (173 lb 4.5 oz)    History of present illness:  Chief Complaint: Nausea vomiting and diarrhea. HPI: [name] is a 64 y.o. male with a history of hypertension, hyperlipidemia, bipolar disorder who was admitted 3 days ago for nausea vomiting diarrhea chest pain and hyponatremia presented with similar complaints. Patient states his symptoms started 11/28 morning with nausea vomiting and diarrhea which were multiple episodes subsequently patient started developing epigastric and chest pain. Chest pain was persistent nonradiating present even at rest. In the ER CT and exam of the chest was done to rule out dissection, PE which was negative. Patient also was found to be hypotensive which responded to [facility]  Hospital Course:  1. Viral gastroenteritis -presented with nausea, vomiting, diarrhea for the past 3 days reporting sick contact. -improved with supportive care, diet advanced -discharged home in stable condition  2. Hyponatremia. 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 Hypokalemia. -replaced  4. Depression/Bipolar disorder -Patient reporting  losing his brother last year 2 weeks prior to Christmas, was feeling depressed lately. He denied suicidal or homicidal ideations, stated he does not want to hurt himself. He feels that going home would be unsafe for him -continue seroquel  5. Chest pain. -Patient reported that his chest pain started after multiple episodes of nausea vomiting and retching -I suspect chest pain is of GI origin. CT angiogram gram of chest was negative for PE or dissection. -troponin's negative, improved with Protonix and GI cocktail  Discharge Exam: Filed Vitals:   12/23/14 1200 12/23/14 1300  BP: 108/68 124/85  Pulse: 81 81  Temp:    Resp: 25 15    General: AAOx3 Cardiovascular: S1S2/RRR Respiratory: CTAB  Discharge Instructions   Discharge Instructions    Diet - low sodium heart healthy    Complete by:  As directed      Increase activity slowly    Complete by:  As directed           Current Discharge Medication List    CONTINUE these medications which have NOT CHANGED   Details  acetaminophen (TYLENOL) 325 MG tablet Take 2 tablets (650 mg total) by mouth every 6 (six) hours as needed for mild pain, moderate pain, fever or headache (or Fever >/= 101).    aspirin EC 325 MG tablet Take 1 tablet (325 mg total) by mouth every morning. For blood thinner for heart health Qty: 30 tablet, Refills: 0    atorvastatin (LIPITOR) 40 MG tablet Take 1 tablet (40 mg total) by mouth daily. For cholesterol Qty: 30 tablet, Refills: 0    buPROPion (WELLBUTRIN XL) 300 MG 24 hr tablet Take 1  tablet (300 mg total) by mouth daily. For depression Qty: 30 tablet, Refills: 0    LORazepam (ATIVAN) 1 MG tablet Take 1 mg by mouth 3 (three) times daily. Take 3 tablets by mouth every day per patient    metoprolol tartrate (LOPRESSOR) 25 MG tablet Take 25 mg by mouth at bedtime.    nicotine (NICODERM CQ - DOSED IN MG/24 HOURS) 21 mg/24hr patch Place 1 patch (21 mg total) onto the skin daily. Qty: 28 patch, Refills:  0    nitroGLYCERIN (NITROSTAT) 0.4 MG SL tablet Place 1 tablet (0.4 mg total) under the tongue every 5 (five) minutes as needed for chest pain (CP or SOB). Qty: 15 tablet, Refills: 0    ondansetron (ZOFRAN ODT) 4 MG disintegrating tablet Take 1 tablet (4 mg total) by mouth every 8 (eight) hours as needed for nausea. Qty: 10 tablet, Refills: 0    pantoprazole (PROTONIX) 40 MG tablet Take 1 tablet (40 mg total) by mouth daily. Qty: 30 tablet, Refills: 0    QUEtiapine (SEROQUEL) 200 MG tablet Take 200 mg by mouth at bedtime.    folic acid (FOLVITE) 1 MG tablet Take 1 tablet (1 mg total) by mouth daily. Qty: 30 tablet, Refills: 0    Multiple Vitamin (MULTIVITAMIN WITH MINERALS) TABS tablet Take 1 tablet by mouth daily.    thiamine 100 MG tablet Take 1 tablet (100 mg total) by mouth daily. Qty: 30 tablet, Refills: 0      STOP taking these medications     lisinopril (PRINIVIL,ZESTRIL) 20 MG tablet      chlordiazePOXIDE (LIBRIUM) 25 MG capsule      QUEtiapine (SEROQUEL XR) 50 MG TB24 24 hr tablet        Allergies  Allergen Reactions  . Ambien [Zolpidem Tartrate] Other (See Comments)    Sleep Walking  . Codeine Itching  . Promethazine     Makes me sick    Follow-up Information    Follow up with [name] HeadingsIAN, MD. Schedule an appointment as soon as possible for a visit in 1 week.   Specialty:  Internal Medicine   Contact information:   [address] [place] [address] [phone]        The results of significant diagnostics from this hospitalization (including imaging, microbiology, ancillary and laboratory) are listed below for reference.    Significant Diagnostic Studies: Dg Chest 2 View  12/03/2014  CLINICAL DATA:  Bilateral chest pain for 2 days with nausea. EXAM: CHEST - 2 VIEW COMPARISON:  Two-view chest x-ray 11/25/2014. FINDINGS: The heart is mildly enlarged. Emphysematous changes are again noted. Remote right-sided rib fractures are  present. There is no edema or effusion to suggest failure. Right shoulder prosthesis is noted. IMPRESSION: 1. Emphysema. 2. No acute cardiopulmonary disease. Electronically Signed   By: Marin Roberts M.D.   On: 12/03/2014 12:22   Dg Chest 2 View  11/25/2014  CLINICAL DATA:  Chest pain. EXAM: CHEST - 2 VIEW COMPARISON:  07/16/2014 FINDINGS: The heart size and mediastinal contours are within normal limits. Atelectasis present at both lung bases, left greater than right. There is no evidence of pulmonary edema, consolidation, pneumothorax, nodule or pleural fluid. Stable old right-sided rib fractures. IMPRESSION: No active disease.  Bibasilar atelectasis. Electronically Signed   By: Irish Lack M.D.   On: 11/25/2014 09:54   Dg Chest Portable 1 View  12/21/2014  CLINICAL DATA:  64 year old male with acute chest pain and shortness of breath today. EXAM: PORTABLE CHEST  1 VIEW COMPARISON:  12/03/2014 and prior chest radiographs FINDINGS: Cardiomegaly identified. There is no evidence of focal airspace disease, pulmonary edema, suspicious pulmonary nodule/mass, pleural effusion, or pneumothorax. No acute bony abnormalities are identified. Right shoulder hemiarthroplasty again noted. IMPRESSION: Cardiomegaly without evidence of acute cardiopulmonary disease. Electronically Signed   By: Harmon Pier M.D.   On: 12/21/2014 19:32   Ct Angio Chest Aorta W/cm &/or Wo/cm  12/21/2014  CLINICAL DATA:  Chest pain and shortness of Breath EXAM: CT ANGIOGRAPHY CHEST WITH CONTRAST TECHNIQUE: Multidetector CT imaging of the chest was performed using the standard protocol during bolus administration of intravenous contrast. Multiplanar CT image reconstructions and MIPs were obtained to evaluate the vascular anatomy. CONTRAST:  OMNIPAQUE IOHEXOL 350 MG/ML SOLN COMPARISON:  Chest CT August 09, 2013; chest radiograph December 21, 2014 FINDINGS: There is no demonstrable pulmonary embolus. Ascending aorta borderline  prominent at 4.0 x 3.6 cm diameter. There is no thoracic dissection. Aorta is somewhat tortuous. Visualized great vessels appear unremarkable. There is mild patchy atelectasis in the right lower lobe. There is no frank edema or consolidation. The right lobe of the thyroid appears somewhat inhomogeneous but stable. The left lobe of the thyroid appears normal. There is no appreciable thoracic adenopathy. The pericardium is not thickened. There is mild left ventricular hypertrophy. There is a focal hiatal hernia. In the visualized upper abdomen, there is incomplete visualization of a noncystic mass arising from the posterior upper pole right kidney measuring 2.4 x 2.2 cm. There is incomplete visualization of a cyst arising from the mid right kidney measuring 1 x 1 cm. Visualized upper abdominal structures otherwise appear unremarkable. There is stable anterior wedging of mid thoracic vertebral bodies. No blastic or lytic bone lesions are appreciable. Review of the MIP images confirms the above findings. IMPRESSION: No demonstrable pulmonary embolus. No edema or consolidation.  Patchy atelectasis right lower lobe. Borderline prominent ascending thoracic aorta. Recommend annual imaging followup by CTA or MRA. This recommendation follows 2010 ACCF/AHA/AATS/ACR/ASA/SCA/SCAI/SIR/STS/SVM Guidelines for the Diagnosis and Management of Patients with Thoracic Aortic Disease. Circulation. 2010; 121: Z610-R604 Hiatal hernia present. No thoracic adenopathy. Incomplete visualization of noncystic appearing mass from right kidney posteriorly upper pole region. This finding warrants further evaluation. Advise either pre and post-contrast renal MR or renal CT to further evaluate. Renal MR would be the optimum imaging study of choice for further assessment. Electronically Signed   By: Bretta Bang III M.D.   On: 12/21/2014 21:24    Microbiology: Recent Results (from the past 240 hour(s))  MRSA PCR Screening     Status: None    Collection Time: 12/22/14  6:53 AM  Result Value Ref Range Status   MRSA by PCR NEGATIVE NEGATIVE Final    Comment:        The GeneXpert MRSA Assay (FDA approved for NASAL specimens only), is one component of a comprehensive MRSA colonization surveillance program. It is not intended to diagnose MRSA infection nor to guide or monitor treatment for MRSA infections.      Labs: Basic Metabolic Panel:  Recent Labs Lab 12/21/14 1900 12/21/14 1921 12/22/14 0221 12/22/14 0521 12/22/14 1010 12/23/14 0310  NA 118* 119* 128* 126* 127* 131*  K 3.6 3.6 3.2* 3.1* 3.7 4.1  CL 85* 89* 99* 97* 98* 104  CO2 17*  --  20* 20* 21* 21*  GLUCOSE 66 65 116* 158* 66 89  BUN 15 16 10 9 6  <5*  CREATININE 1.50* 1.50* 1.27* 1.12 1.04 0.89  CALCIUM 8.1*  --  7.6* 7.6* 8.0* 8.6*   Liver Function Tests:  Recent Labs Lab 12/21/14 1900 12/22/14 0221  AST 22 20  ALT 11* 10*  ALKPHOS 78 61  BILITOT 0.4 0.1*  PROT 6.3* 5.0*  ALBUMIN 3.3* 2.7*   No results for input(s): LIPASE, AMYLASE in the last 168 hours. No results for input(s): AMMONIA in the last 168 hours. CBC:  Recent Labs Lab 12/21/14 1900 12/21/14 1921 12/22/14 0221 12/22/14 0521 12/23/14 0310  WBC 5.5  --  4.3 3.8* 4.6  NEUTROABS  --   --   --  2.0  --   HGB 11.0* 13.6 9.8* 9.3* 9.6*  HCT 34.4* 40.0 30.5* 29.2* 30.3*  MCV 79.8  --  80.9 80.4 81.2  PLT 273  --  224 231 233   Cardiac Enzymes:  Recent Labs Lab 12/22/14 0221 12/22/14 0725 12/22/14 1430  TROPONINI <0.03 <0.03 <0.03   BNP: BNP (last 3 results)  Recent Labs  03/30/14 2346 05/22/14 1902 06/12/14 2338  BNP 64.6 61.5 75.8    ProBNP (last 3 results) No results for input(s): PROBNP in the last 8760 hours.  CBG: No results for input(s): GLUCAP in the last 168 hours.     SignedZannie Cove:  Jahmir Salo  [facility] 12/23/2014, 3:15 PM

## 2014-12-25 LAB — GI PATHOGEN PANEL BY PCR, STOOL
C difficile toxin A/B: NOT DETECTED
Campylobacter by PCR: NOT DETECTED
Cryptosporidium by PCR: NOT DETECTED
E COLI (ETEC) LT/ST: NOT DETECTED
E COLI 0157 BY PCR: NOT DETECTED
E coli (STEC): NOT DETECTED
G LAMBLIA BY PCR: NOT DETECTED
Norovirus GI/GII: NOT DETECTED
Rotavirus A by PCR: NOT DETECTED
SHIGELLA BY PCR: NOT DETECTED
Salmonella by PCR: NOT DETECTED

## 2015-01-19 ENCOUNTER — Emergency Department (HOSPITAL_COMMUNITY): Payer: Medicare Other

## 2015-01-19 ENCOUNTER — Other Ambulatory Visit: Payer: Self-pay

## 2015-01-19 ENCOUNTER — Encounter (HOSPITAL_COMMUNITY): Payer: Self-pay | Admitting: Vascular Surgery

## 2015-01-19 ENCOUNTER — Emergency Department (HOSPITAL_COMMUNITY)
Admission: EM | Admit: 2015-01-19 | Discharge: 2015-01-19 | Disposition: A | Discharge status: Home / Self Care | Payer: Medicare Other | Attending: Emergency Medicine | Admitting: Emergency Medicine

## 2015-01-19 DIAGNOSIS — R0789 Other chest pain: Secondary | ICD-10-CM | POA: Insufficient documentation

## 2015-01-19 DIAGNOSIS — R197 Diarrhea, unspecified: Secondary | ICD-10-CM | POA: Insufficient documentation

## 2015-01-19 DIAGNOSIS — I10 Essential (primary) hypertension: Secondary | ICD-10-CM | POA: Diagnosis not present

## 2015-01-19 DIAGNOSIS — Z9889 Other specified postprocedural states: Secondary | ICD-10-CM | POA: Insufficient documentation

## 2015-01-19 DIAGNOSIS — G4700 Insomnia, unspecified: Secondary | ICD-10-CM | POA: Insufficient documentation

## 2015-01-19 DIAGNOSIS — Z7982 Long term (current) use of aspirin: Secondary | ICD-10-CM | POA: Insufficient documentation

## 2015-01-19 DIAGNOSIS — K219 Gastro-esophageal reflux disease without esophagitis: Secondary | ICD-10-CM | POA: Diagnosis not present

## 2015-01-19 DIAGNOSIS — F17210 Nicotine dependence, cigarettes, uncomplicated: Secondary | ICD-10-CM | POA: Insufficient documentation

## 2015-01-19 DIAGNOSIS — Z955 Presence of coronary angioplasty implant and graft: Secondary | ICD-10-CM | POA: Insufficient documentation

## 2015-01-19 DIAGNOSIS — Z8701 Personal history of pneumonia (recurrent): Secondary | ICD-10-CM | POA: Diagnosis not present

## 2015-01-19 DIAGNOSIS — F419 Anxiety disorder, unspecified: Secondary | ICD-10-CM | POA: Insufficient documentation

## 2015-01-19 DIAGNOSIS — R0602 Shortness of breath: Secondary | ICD-10-CM | POA: Insufficient documentation

## 2015-01-19 DIAGNOSIS — I25119 Atherosclerotic heart disease of native coronary artery with unspecified angina pectoris: Secondary | ICD-10-CM | POA: Diagnosis not present

## 2015-01-19 DIAGNOSIS — E7800 Pure hypercholesterolemia, unspecified: Secondary | ICD-10-CM | POA: Diagnosis not present

## 2015-01-19 DIAGNOSIS — M1990 Unspecified osteoarthritis, unspecified site: Secondary | ICD-10-CM | POA: Diagnosis not present

## 2015-01-19 DIAGNOSIS — R079 Chest pain, unspecified: Secondary | ICD-10-CM | POA: Diagnosis present

## 2015-01-19 DIAGNOSIS — Z79899 Other long term (current) drug therapy: Secondary | ICD-10-CM | POA: Insufficient documentation

## 2015-01-19 DIAGNOSIS — F319 Bipolar disorder, unspecified: Secondary | ICD-10-CM | POA: Insufficient documentation

## 2015-01-19 DIAGNOSIS — Z9289 Personal history of other medical treatment: Secondary | ICD-10-CM | POA: Diagnosis not present

## 2015-01-19 LAB — BASIC METABOLIC PANEL
Anion gap: 12 (ref 5–15)
BUN: 5 mg/dL — AB (ref 6–20)
CO2: 20 mmol/L — ABNORMAL LOW (ref 22–32)
CREATININE: 0.89 mg/dL (ref 0.61–1.24)
Calcium: 8.7 mg/dL — ABNORMAL LOW (ref 8.9–10.3)
Chloride: 102 mmol/L (ref 101–111)
GFR calc Af Amer: >60 mL/min (ref >60)
GLUCOSE: 93 mg/dL (ref 65–99)
POTASSIUM: 3.9 mmol/L (ref 3.5–5.1)
Sodium: 134 mmol/L — ABNORMAL LOW (ref 135–145)

## 2015-01-19 LAB — I-STAT TROPONIN, ED
Troponin i, poc: 0 ng/mL (ref 0.00–0.08)
Troponin i, poc: 0 ng/mL (ref 0.00–0.08)

## 2015-01-19 LAB — CBC
HCT: 35.2 % — ABNORMAL LOW (ref 39.0–52.0)
HEMOGLOBIN: 11.1 g/dL — AB (ref 13.0–17.0)
MCH: 25.8 pg — AB (ref 26.0–34.0)
MCHC: 31.5 g/dL (ref 30.0–36.0)
MCV: 81.9 fL (ref 78.0–100.0)
PLATELETS: 146 10*3/uL — AB (ref 150–400)
RBC: 4.3 MIL/uL (ref 4.22–5.81)
RDW: 17.2 % — ABNORMAL HIGH (ref 11.5–15.5)
WBC: 4.6 10*3/uL (ref 4.0–10.5)

## 2015-01-19 MED ORDER — GI COCKTAIL ~~LOC~~
30.0000 mL | Freq: Once | ORAL | Status: AC
  Administered 2015-01-19: 30 mL via ORAL
  Filled 2015-01-19: qty 30

## 2015-01-19 NOTE — ED Provider Notes (Signed)
Greater than 48 hours of constant chest pain and the patient has known coronary artery disease. He describes this as a heaviness in the middle of his chest, it seems to be worse with position especially taking a deep breath and less with exertion. On exam the patient has clear heart and lung sounds, no murmurs, minimal scant edema bilaterally, no JVD. EKG is unremarkable and unchanged from prior, troponin is normal after 48 hours, doubt acute coronary syndrome.  Medical screening examination/treatment/procedure(s) were conducted as a shared visit with non-physician practitioner(s) and myself.  I personally evaluated the patient during the encounter.  Clinical Impression:   Final diagnoses:  Other chest pain         Eber HongBrian Mena Simonis, MD 01/23/15 2218

## 2015-01-19 NOTE — ED Notes (Signed)
Pt reports to the ED for eval of CP that began at 9 am. Pt denies any SOB, lightheadedness, dizziness, or N/V. 12 lead unremarkable. Pt HTN en route. Pt had 324 ASA PTA. Pt A&OX4, resp e/u, and skin warm and dry.

## 2015-01-19 NOTE — Discharge Instructions (Signed)
Nonspecific Chest Pain  °Chest pain can be caused by many different conditions. There is always a chance that your pain could be related to something serious, such as a heart attack or a blood clot in your lungs. Chest pain can also be caused by conditions that are not life-threatening. If you have chest pain, it is very important to follow up with your health care provider. °CAUSES  °Chest pain can be caused by: °· Heartburn. °· Pneumonia or bronchitis. °· Anxiety or stress. °· Inflammation around your heart (pericarditis) or lung (pleuritis or pleurisy). °· A blood clot in your lung. °· A collapsed lung (pneumothorax). It can develop suddenly on its own (spontaneous pneumothorax) or from trauma to the chest. °· Shingles infection (varicella-zoster virus). °· Heart attack. °· Damage to the bones, muscles, and cartilage that make up your chest wall. This can include: °¨ Bruised bones due to injury. °¨ Strained muscles or cartilage due to frequent or repeated coughing or overwork. °¨ Fracture to one or more ribs. °¨ Sore cartilage due to inflammation (costochondritis). °RISK FACTORS  °Risk factors for chest pain may include: °· Activities that increase your risk for trauma or injury to your chest. °· Respiratory infections or conditions that cause frequent coughing. °· Medical conditions or overeating that can cause heartburn. °· Heart disease or family history of heart disease. °· Conditions or health behaviors that increase your risk of developing a blood clot. °· Having had chicken pox (varicella zoster). °SIGNS AND SYMPTOMS °Chest pain can feel like: °· Burning or tingling on the surface of your chest or deep in your chest. °· Crushing, pressure, aching, or squeezing pain. °· Dull or sharp pain that is worse when you move, cough, or take a deep breath. °· Pain that is also felt in your back, neck, shoulder, or arm, or pain that spreads to any of these areas. °Your chest pain may come and go, or it may stay  constant. °DIAGNOSIS °Lab tests or other studies may be needed to find the cause of your pain. Your health care provider may have you take a test called an ambulatory ECG (electrocardiogram). An ECG records your heartbeat patterns at the time the test is performed. You may also have other tests, such as: °· Transthoracic echocardiogram (TTE). During echocardiography, sound waves are used to create a picture of all of the heart structures and to look at how blood flows through your heart. °· Transesophageal echocardiogram (TEE). This is a more advanced imaging test that obtains images from inside your body. It allows your health care provider to see your heart in finer detail. °· Cardiac monitoring. This allows your health care provider to monitor your heart rate and rhythm in real time. °· Holter monitor. This is a portable device that records your heartbeat and can help to diagnose abnormal heartbeats. It allows your health care provider to track your heart activity for several days, if needed. °· Stress tests. These can be done through exercise or by taking medicine that makes your heart beat more quickly. °· Blood tests. °· Imaging tests. °TREATMENT  °Your treatment depends on what is causing your chest pain. Treatment may include: °· Medicines. These may include: °¨ Acid blockers for heartburn. °¨ Anti-inflammatory medicine. °¨ Pain medicine for inflammatory conditions. °¨ Antibiotic medicine, if an infection is present. °¨ Medicines to dissolve blood clots. °¨ Medicines to treat coronary artery disease. °· Supportive care for conditions that do not require medicines. This may include: °¨ Resting. °¨ Applying heat   or cold packs to injured areas. °¨ Limiting activities until pain decreases. °HOME CARE INSTRUCTIONS °· If you were prescribed an antibiotic medicine, finish it all even if you start to feel better. °· Avoid any activities that bring on chest pain. °· Do not use any tobacco products, including  cigarettes, chewing tobacco, or electronic cigarettes. If you need help quitting, ask your health care provider. °· Do not drink alcohol. °· Take medicines only as directed by your health care provider. °· Keep all follow-up visits as directed by your health care provider. This is important. This includes any further testing if your chest pain does not go away. °· If heartburn is the cause for your chest pain, you may be told to keep your head raised (elevated) while sleeping. This reduces the chance that acid will go from your stomach into your esophagus. °· Make lifestyle changes as directed by your health care provider. These may include: °¨ Getting regular exercise. Ask your health care provider to suggest some activities that are safe for you. °¨ Eating a heart-healthy diet. A registered dietitian can help you to learn healthy eating options. °¨ Maintaining a healthy weight. °¨ Managing diabetes, if necessary. °¨ Reducing stress. °SEEK MEDICAL CARE IF: °· Your chest pain does not go away after treatment. °· You have a rash with blisters on your chest. °· You have a fever. °SEEK IMMEDIATE MEDICAL CARE IF:  °· Your chest pain is worse. °· You have an increasing cough, or you cough up blood. °· You have severe abdominal pain. °· You have severe weakness. °· You faint. °· You have chills. °· You have sudden, unexplained chest discomfort. °· You have sudden, unexplained discomfort in your arms, back, neck, or jaw. °· You have shortness of breath at any time. °· You suddenly start to sweat, or your skin gets clammy. °· You feel nauseous or you vomit. °· You suddenly feel light-headed or dizzy. °· Your heart begins to beat quickly, or it feels like it is skipping beats. °These symptoms may represent a serious problem that is an emergency. Do not wait to see if the symptoms will go away. Get medical help right away. Call your local emergency services (911 in the [place] Do not drive yourself to the hospital. °  °This  information is not intended to replace advice given to you by your health care provider. Make sure you discuss any questions you have with your health care provider. °  °Document Released: 10/19/2004 Document Revised: 01/30/2014 Document Reviewed: 08/15/2013 °[employer] Interactive Patient Education ©2016 [employer] ° °

## 2015-01-19 NOTE — ED Provider Notes (Signed)
CSN: [id number]     Arrival date & time 01/19/15  1616 History   First MD Initiated Contact with Patient 01/19/15 1924     Chief Complaint  Patient presents with  . Chest Pain     (Consider location/radiation/quality/duration/timing/severity/associated sxs/prior Treatment) HPI Comments: Patient presents with history of non-radiating substernal chest pressure onset two days ago. Patient states the pain awoke him from his sleep.  Pain/pressure has been constant.  Patient states he feels short of breath, but relates the dyspnea to increased pressure with deep inspiration.  Patient is a 64 y.o. male presenting with chest pain. The history is provided by the patient and medical records. No language interpreter was used.  Chest Pain Pain location:  Substernal area Pain quality: pressure   Pain radiates to:  Does not radiate Pain radiates to the back: no   Pain severity:  Moderate Onset quality:  Sudden Duration:  2 days Timing:  Intermittent Progression:  Waxing and waning Chronicity:  Recurrent Context: breathing   Ineffective treatments:  Rest Associated symptoms: shortness of breath   Associated symptoms: no abdominal pain, no cough, no diaphoresis, no lower extremity edema, no nausea, no orthopnea, no syncope and not vomiting   Risk factors: male sex and smoking     Past Medical History  Diagnosis Date  . MVP (mitral valve prolapse)   . Anxiety   . Hypertension   . Hyperlipidemia   . Palpitations   . Bipolar disorder (HCC)   . Depression   . Esophagitis   . Gastritis   . Insomnia   . Hypercholesteremia   . Coronary artery disease     a.  s/p PTCA to PL branch of RCA in 2007;  b.  Last LHC 04/2008:  LM with irregularities, LAD 50%, mid LAD 50%, proximal D1 80-90%, proximal OM1 30-40%, proximal OM2 50%, PLB branch 30-40%, site of prior angioplasty in the RCA widely patent, EF 65%=>Med Rx;  c. Nuclear study a 12/2011: EF 71% and normal perfusion.    Marland Kitchen History of  echocardiogram     Echo 08/16/09: Mild LVH, EF 55-60%, no normal wall motion, grade 1 diastolic dysfunction  . Anginal pain (HCC)   . Pneumonia 2005  . GERD (gastroesophageal reflux disease)   . Arthritis     "hands; especially in cold weather" (12/11/2013)  . Alcohol related seizure (HCC)     "when I go off it"   Past Surgical History  Procedure Laterality Date  . Coronary angioplasty  2007    RCA  . Cardiac catheterization  04/2008    EF 65%  . Knee arthroscopy, medial patello femoral ligament repair    . Hip arthroplasty Bilateral     "both replaced twice"   . Total shoulder replacement Right   . Total hip revision  02/07/2012    Procedure: TOTAL HIP REVISION;  Surgeon: [name], MD;  Location: [facility] OR;  Service: Orthopedics;  Laterality: Left;  [place] Hip Revision Set  . Total knee arthroplasty Right   . Appendectomy  1958  . Tonsillectomy  1958  . Joint replacement     Family History  Problem Relation Age of Onset  . Heart disease Neg Hx   . Prostate cancer Father    Social History  Substance Use Topics  . Smoking status: Current Some Day Smoker -- 0.50 packs/day for 40 years    Types: Cigarettes  . Smokeless tobacco: Never Used  . Alcohol Use: 0.0 oz/week  Comment: patient drinks 2-3 times a week    Review of Systems  Constitutional: Negative for diaphoresis.  Respiratory: Positive for shortness of breath. Negative for cough.   Cardiovascular: Positive for chest pain. Negative for orthopnea and syncope.  Gastrointestinal: Positive for diarrhea. Negative for nausea, vomiting and abdominal pain.  All other systems reviewed and are negative.     Allergies  Ambien; Codeine; and Promethazine  Home Medications   Prior to Admission medications   Medication Sig Start Date End Date Taking? Authorizing Provider  acetaminophen (TYLENOL) 325 MG tablet Take 2 tablets (650 mg total) by mouth every 6 (six) hours as needed for mild pain, moderate pain, fever or  headache (or Fever >/= 101). 11/28/14   Elease Etienne, MD  aspirin EC 325 MG tablet Take 1 tablet (325 mg total) by mouth every morning. For blood thinner for heart health 04/02/14   Alison Murray, MD  atorvastatin (LIPITOR) 40 MG tablet Take 1 tablet (40 mg total) by mouth daily. For cholesterol 10/24/12   Sanjuana Kava, NP  buPROPion (WELLBUTRIN XL) 300 MG 24 hr tablet Take 1 tablet (300 mg total) by mouth daily. For depression 10/24/12   Sanjuana Kava, NP  folic acid (FOLVITE) 1 MG tablet Take 1 tablet (1 mg total) by mouth daily. Patient not taking: Reported on 12/21/2014 11/28/14   Elease Etienne, MD  LORazepam (ATIVAN) 1 MG tablet Take 1 mg by mouth 3 (three) times daily. Take 3 tablets by mouth every day per patient    Historical Provider, MD  metoprolol tartrate (LOPRESSOR) 25 MG tablet Take 25 mg by mouth at bedtime.    Historical Provider, MD  Multiple Vitamin (MULTIVITAMIN WITH MINERALS) TABS tablet Take 1 tablet by mouth daily. Patient not taking: Reported on 12/21/2014 11/28/14   Elease Etienne, MD  nicotine (NICODERM CQ - DOSED IN MG/24 HOURS) 21 mg/24hr patch Place 1 patch (21 mg total) onto the skin daily. 11/28/14   Elease Etienne, MD  nitroGLYCERIN (NITROSTAT) 0.4 MG SL tablet Place 1 tablet (0.4 mg total) under the tongue every 5 (five) minutes as needed for chest pain (CP or SOB). 10/24/12   Sanjuana Kava, NP  ondansetron (ZOFRAN ODT) 4 MG disintegrating tablet Take 1 tablet (4 mg total) by mouth every 8 (eight) hours as needed for nausea. 12/03/14   Mady Gemma, PA-C  pantoprazole (PROTONIX) 40 MG tablet Take 1 tablet (40 mg total) by mouth daily. Patient taking differently: Take 40 mg by mouth daily. Prn 11/28/14   Elease Etienne, MD  QUEtiapine (SEROQUEL) 200 MG tablet Take 200 mg by mouth at bedtime.    Historical Provider, MD  thiamine 100 MG tablet Take 1 tablet (100 mg total) by mouth daily. Patient not taking: Reported on 12/03/2014 11/28/14   Elease Etienne,  MD   BP 158/113 mmHg  Pulse 98  Temp(Src) 98.4 F (36.9 C) (Oral)  Resp 15  SpO2 97% Physical Exam  Constitutional: He is oriented to person, place, and time. He appears well-developed and well-nourished. No distress.  HENT:  Head: Normocephalic.  Eyes: Conjunctivae are normal. Pupils are equal, round, and reactive to light.  Neck: Neck supple.  Cardiovascular: Normal rate, regular rhythm and intact distal pulses.   Pulmonary/Chest: Effort normal and breath sounds normal.  Abdominal: Soft. Bowel sounds are normal.  Musculoskeletal: He exhibits no edema or tenderness.  Lymphadenopathy:    He has no cervical adenopathy.  Neurological: He is  alert and oriented to person, place, and time.  Skin: Skin is warm and dry.  Psychiatric: He has a normal mood and affect.  Nursing note and vitals reviewed.   ED Course  Procedures (including critical care time) Labs Review Labs Reviewed  BASIC METABOLIC PANEL - Abnormal; Notable for the following:    Sodium 134 (*)    CO2 20 (*)    BUN 5 (*)    Calcium 8.7 (*)    All other components within normal limits  CBC - Abnormal; Notable for the following:    Hemoglobin 11.1 (*)    HCT 35.2 (*)    MCH 25.8 (*)    RDW 17.2 (*)    Platelets 146 (*)    All other components within normal limits  I-STAT TROPOININ, ED    Imaging Review Dg Chest 2 View  01/19/2015  CLINICAL DATA:  Chest pain, shortness of breath EXAM: CHEST  2 VIEW COMPARISON:  12/24/2014 FINDINGS: Lungs are essentially clear. No focal consolidation. No pleural effusion or pneumothorax. Mild cardiomegaly. Mild degenerative changes of the visualized thoracolumbar spine. Stable mild mid thoracic compression fracture deformity. Right shoulder arthroplasty. IMPRESSION: No evidence of acute cardiopulmonary disease. Electronically Signed   By: Charline BillsSriyesh  Krishnan M.D.   On: 01/19/2015 16:57   I have personally reviewed and evaluated these images and lab results as part of my medical  decision-making.   EKG Interpretation   Date/Time:  Tuesday January 19 2015 16:17:23 EST Ventricular Rate:  107 PR Interval:  216 QRS Duration: 92 QT Interval:  320 QTC Calculation: 427 R Axis:   177 Text Interpretation:  Sinus tachycardia with 1st degree A-V block Left  posterior fascicular block Inferior infarct , age undetermined Cannot rule  out Anterior infarct , age undetermined Abnormal ECG Since last tracing  rate faster Confirmed by MILLER  MD, BRIAN ([id number]) on 01/19/2015 7:21:31  PM     Atypical chest pain. Negative delta troponin. No ischemic changes on ECG. Review of records reveals persistent mild tachycardia. Recent workup for chest pain with negative CTA of chest. Doubt ACS/PE.  MDM   Final diagnoses:  None    Atypical chest pain. Return precautions discussed. Follow-up with PCP.    Felicie Mornavid Sofia Jaquith, NP 01/20/15 [id number]  Eber HongBrian Miller, MD 01/23/15 2219

## 2015-02-24 DEATH — deceased

## 2015-11-24 IMAGING — CR DG SHOULDER 2+V*L*
2 series · 2 of 2 positions shown · non-contrast
Comparison: None.

CLINICAL DATA: Fall from standing position with left shoulder pain,
initial encounter

EXAM:
LEFT SHOULDER - 2+ VIEW

[t shoulder ap internal righ]
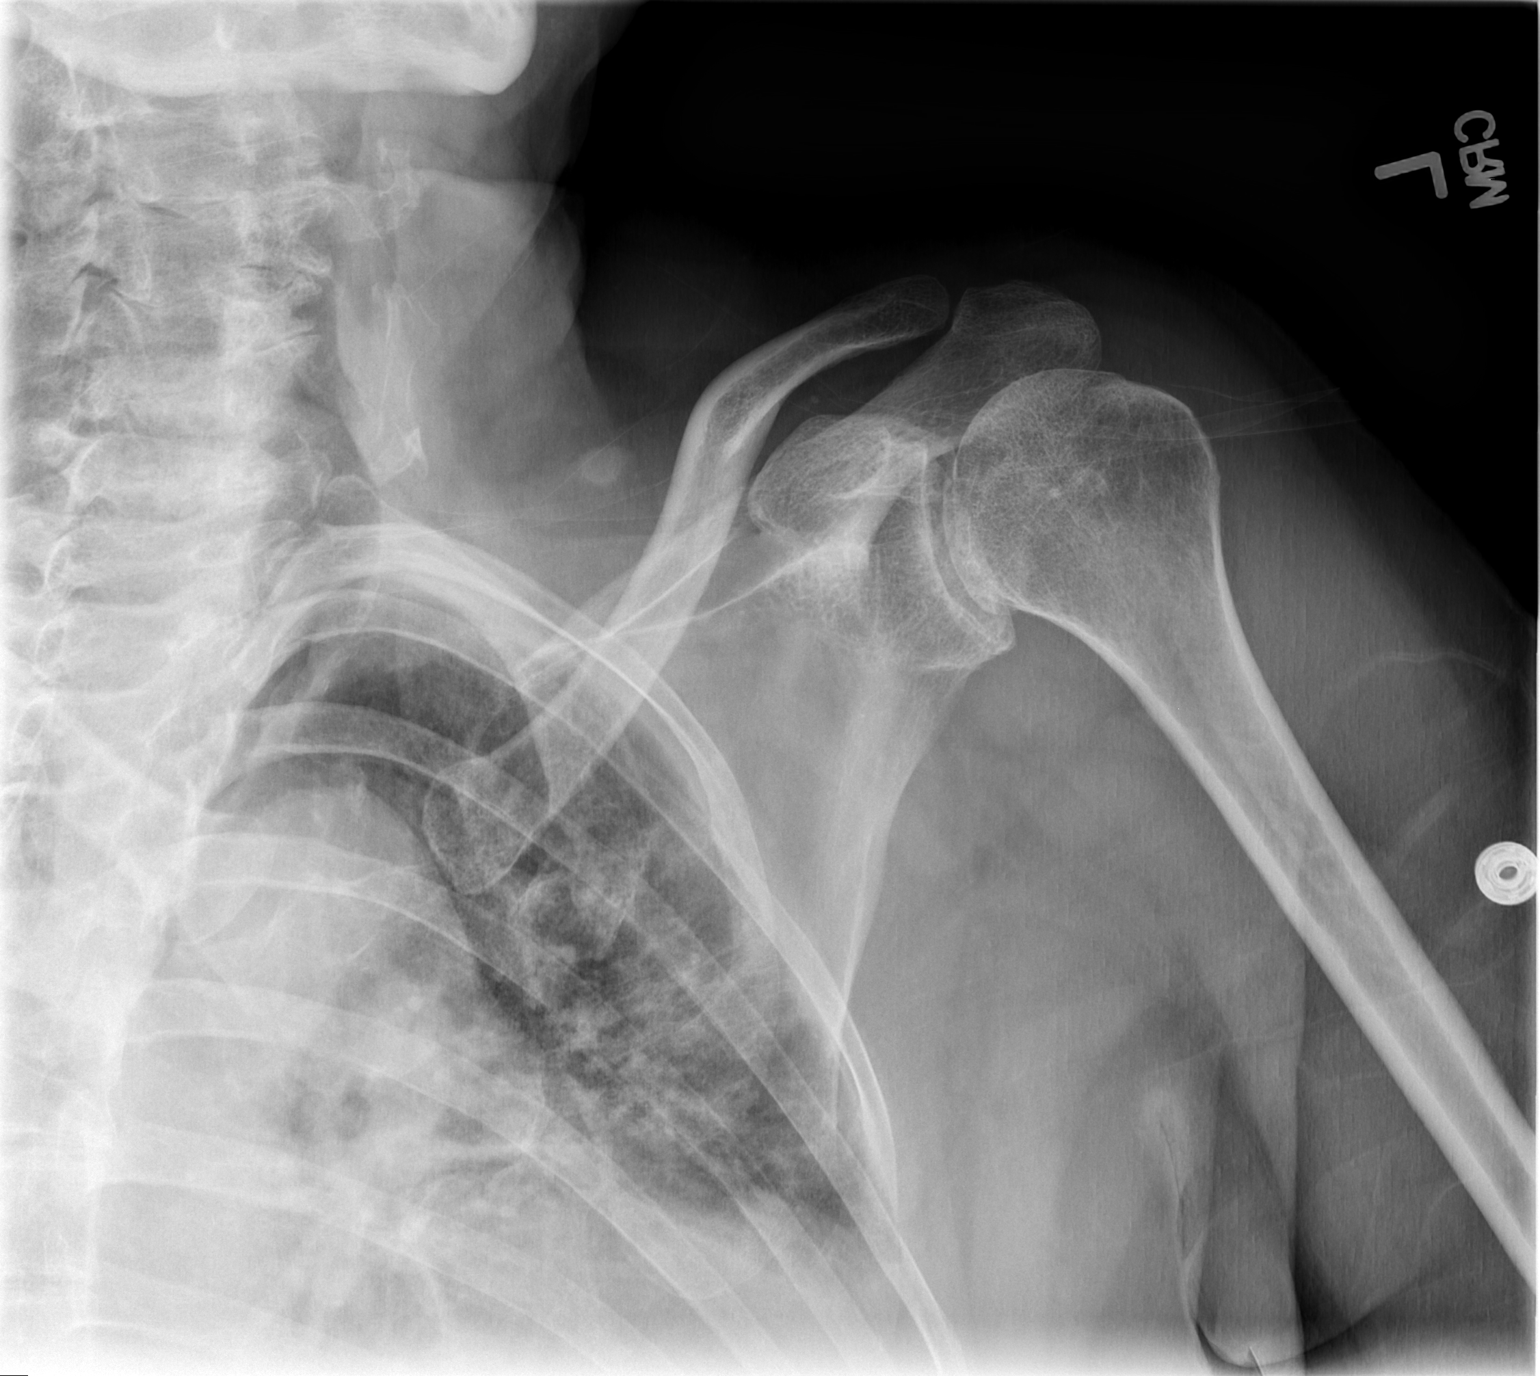

[t shoulder y view right]
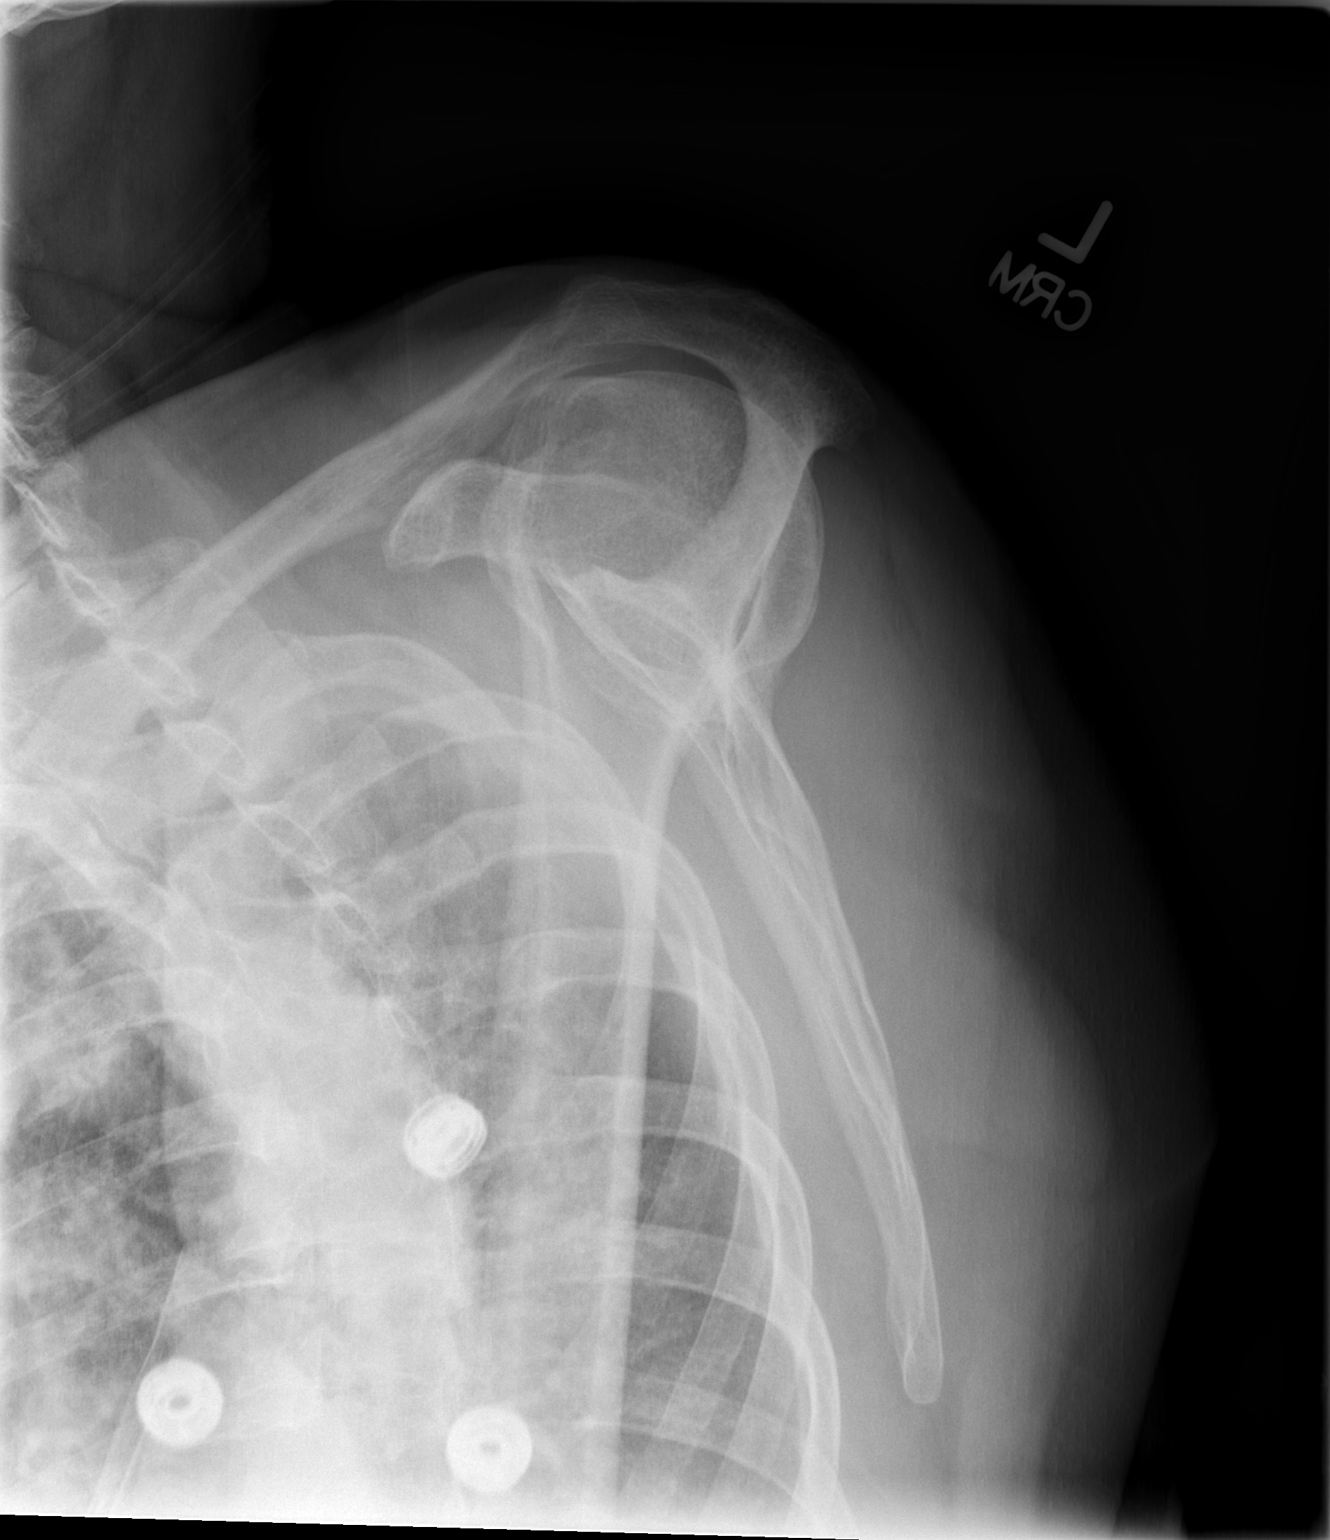

[2 of 2 positions shown; findings below may reference images not displayed]

FINDINGS: There is no evidence of fracture or dislocation. There is no
evidence of arthropathy or other focal bone abnormality. Soft
tissues are unremarkable.
IMPRESSION: No acute abnormality noted.

## 2015-11-24 IMAGING — CR DG CHEST 2V
2 series · 2 of 2 positions shown · non-contrast
Comparison: 08/09/2013

CLINICAL DATA: Dizziness, fell from standing position 4 days ago,
chest pain, LEFT shoulder pain, personal history of coronary artery
disease, hypertension

EXAM:
CHEST  2 VIEW

[t chest supine]
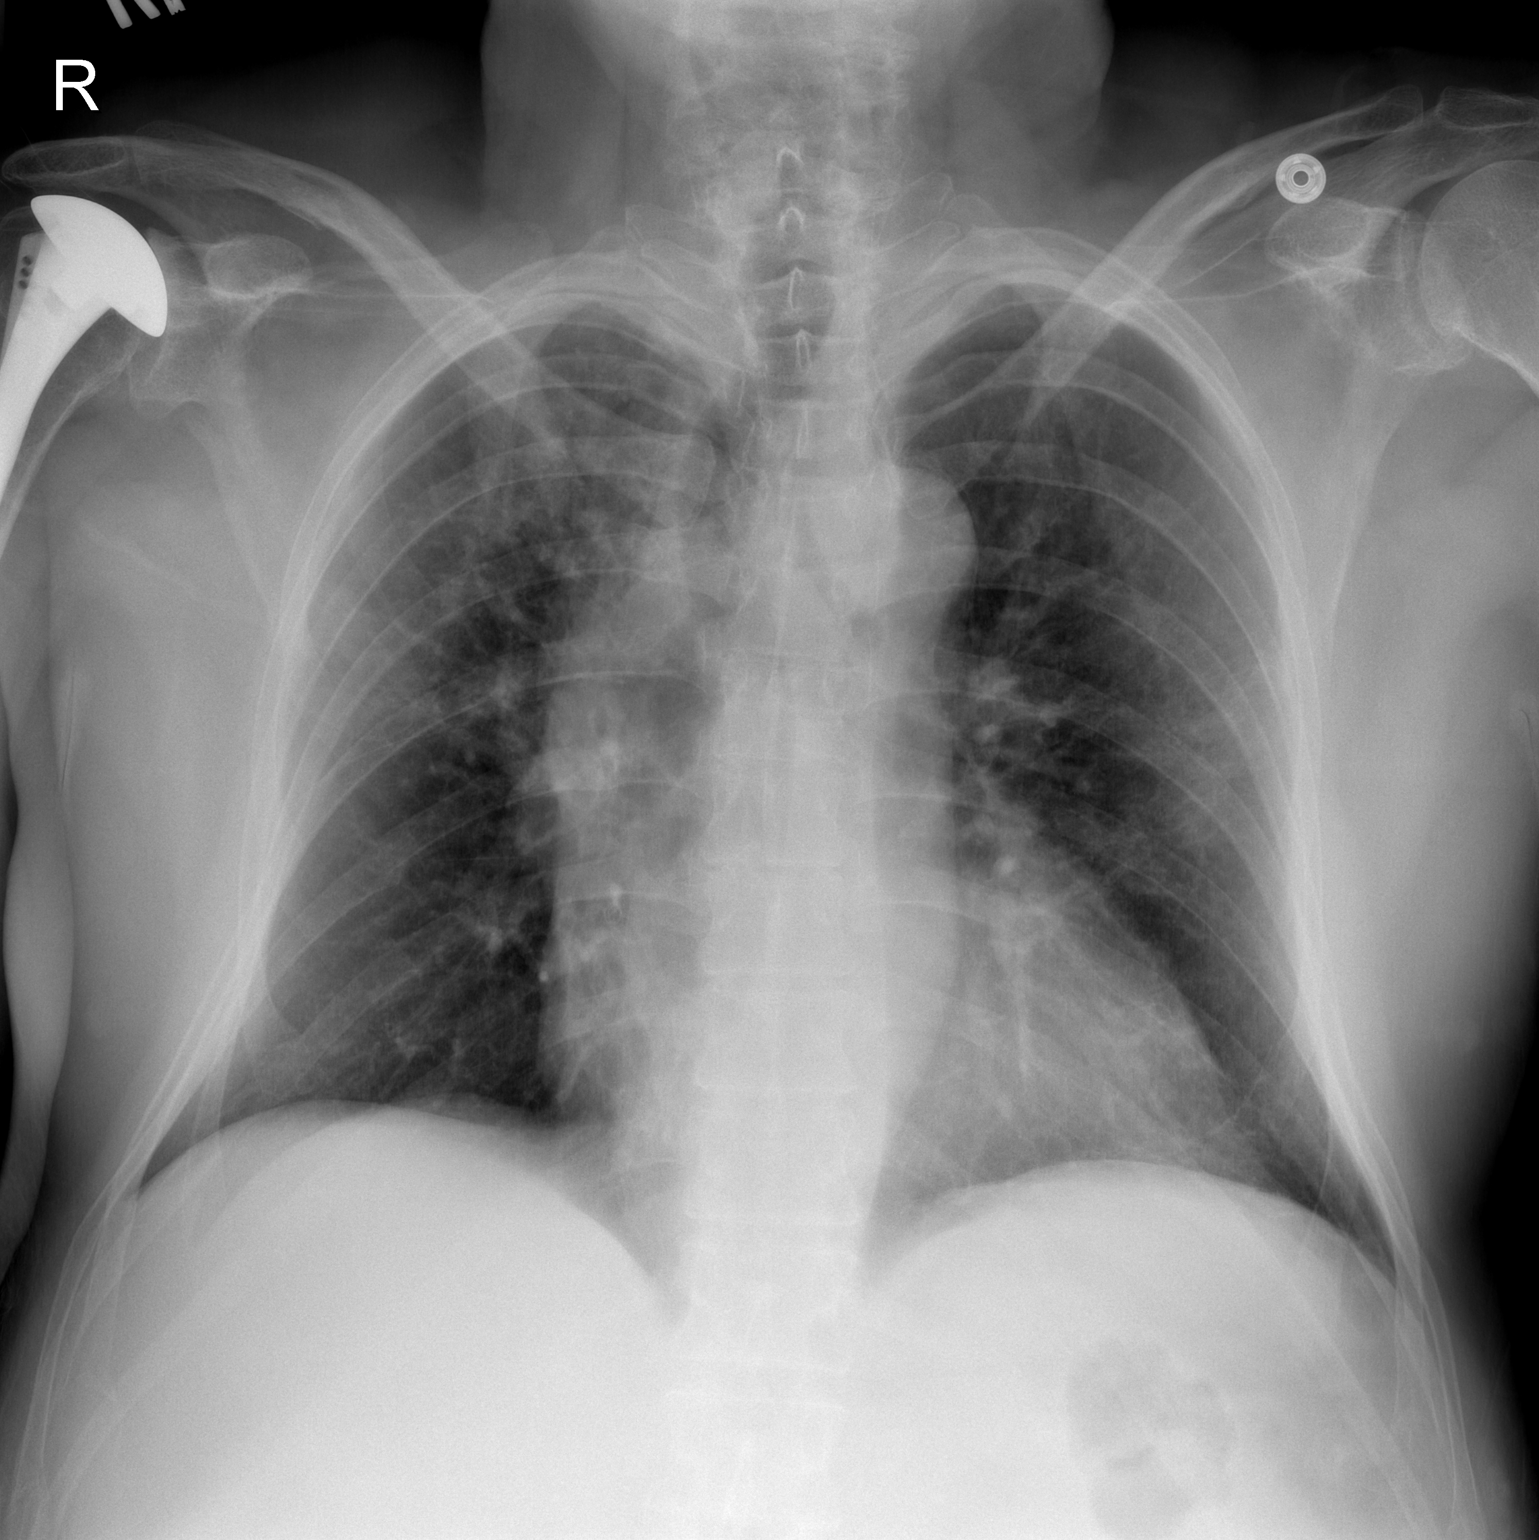

[w chest lat]
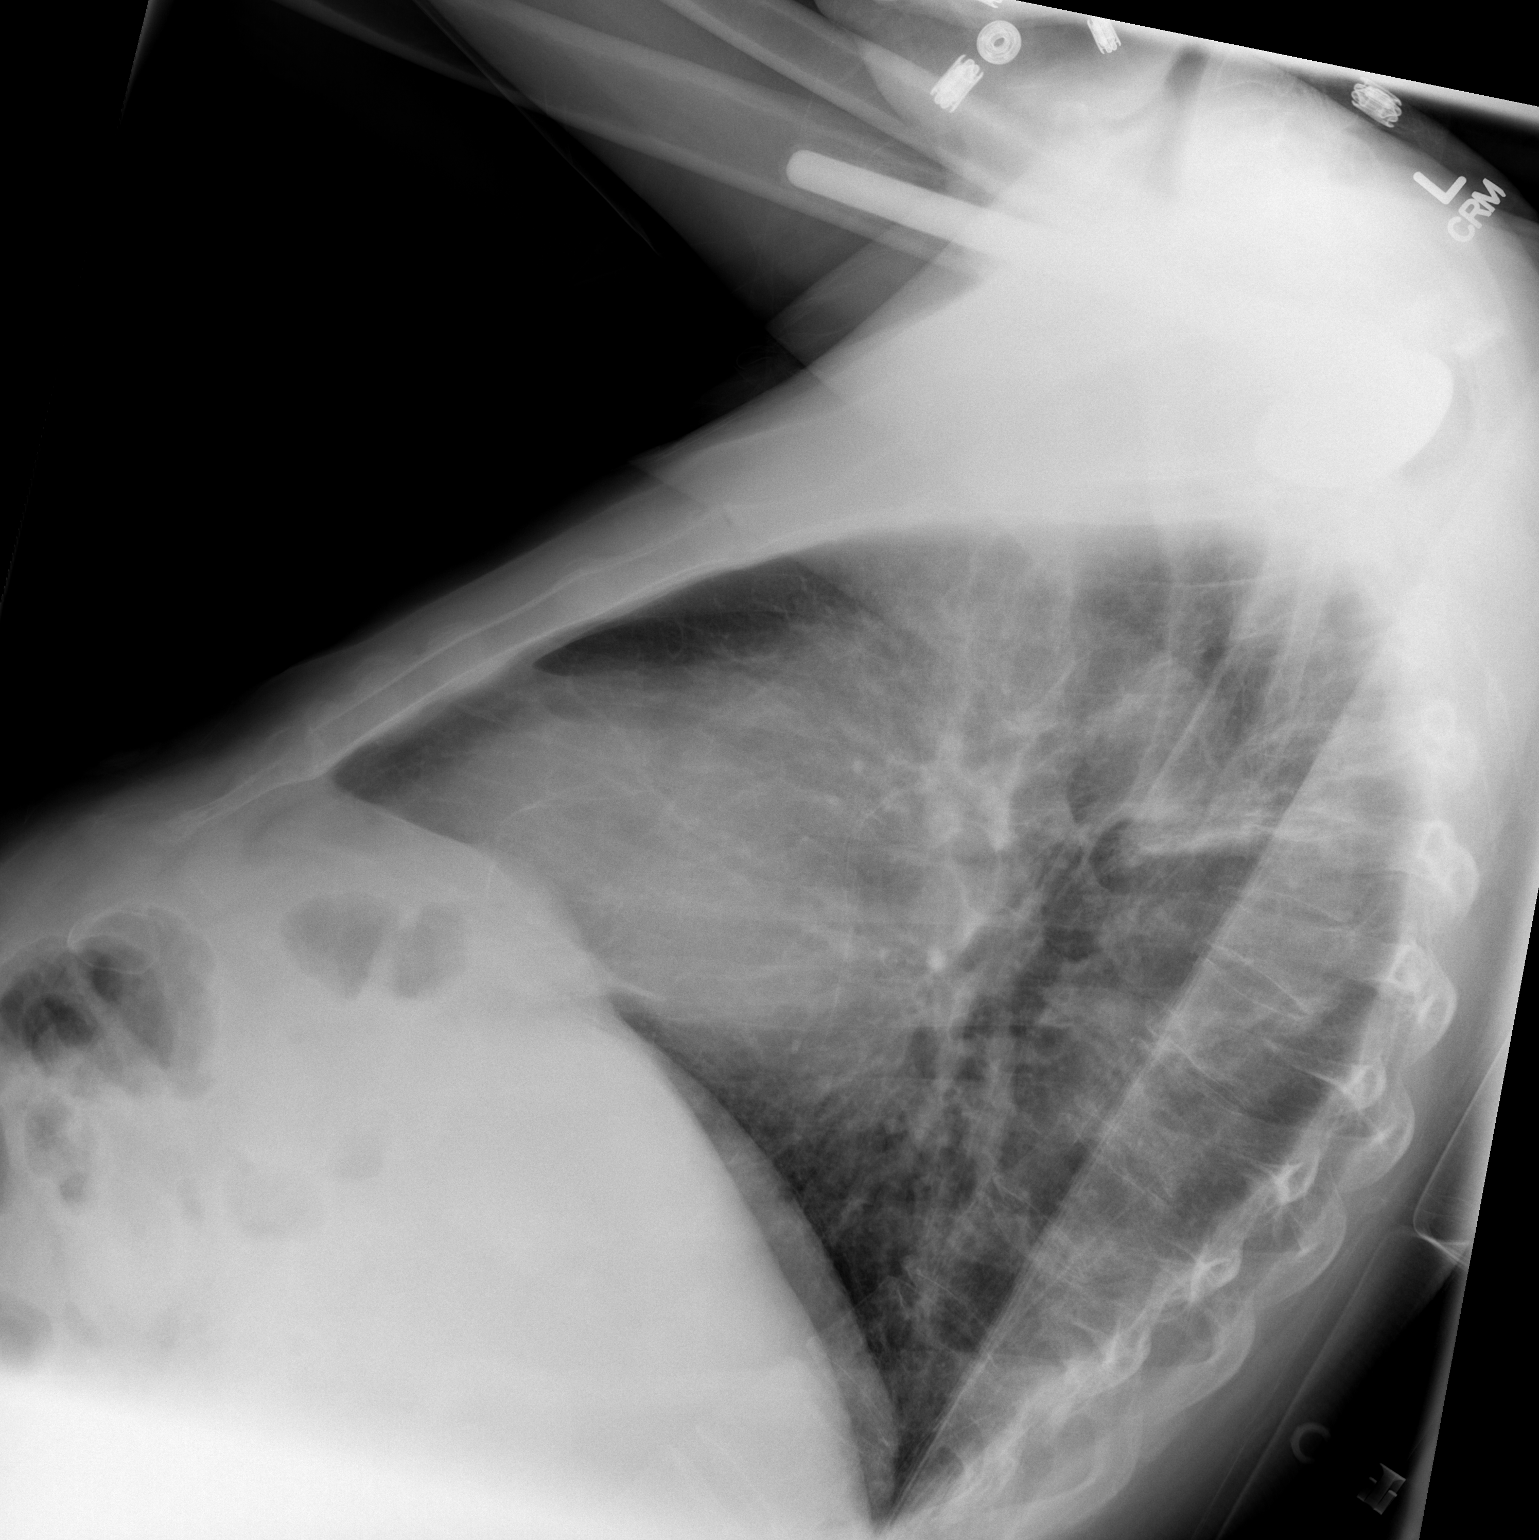

[2 of 2 positions shown; findings below may reference images not displayed]

FINDINGS: Mild enlargement of cardiac silhouette.

Mildly tortuous aorta.

Mediastinal contours and pulmonary vascularity normal.

Mild infiltrate in RIGHT upper lobe.

Minimal atelectasis or infiltrate in LEFT mid lung.

Remaining lungs clear.

No pleural effusion or pneumothorax.

Bones appear demineralized.

Probable fracture anterior LEFT fifth rib.

Old healed fracture posterior RIGHT eighth and questionably ninth
ribs.
IMPRESSION: Mild enlargement of cardiac silhouette.

Mild RIGHT upper lobe infiltrate with mild atelectasis versus
infiltrate in LEFT mid lung.

Suspected acute fracture anterior LEFT fifth rib.

## 2015-11-24 IMAGING — CR DG CHEST 1V PORT
1 series · 1 of 1 positions shown · non-contrast
Comparison: 12/10/2013

CLINICAL DATA: Status post PICC line placement, shortness of breath

EXAM:
PORTABLE CHEST - 1 VIEW

[AP]
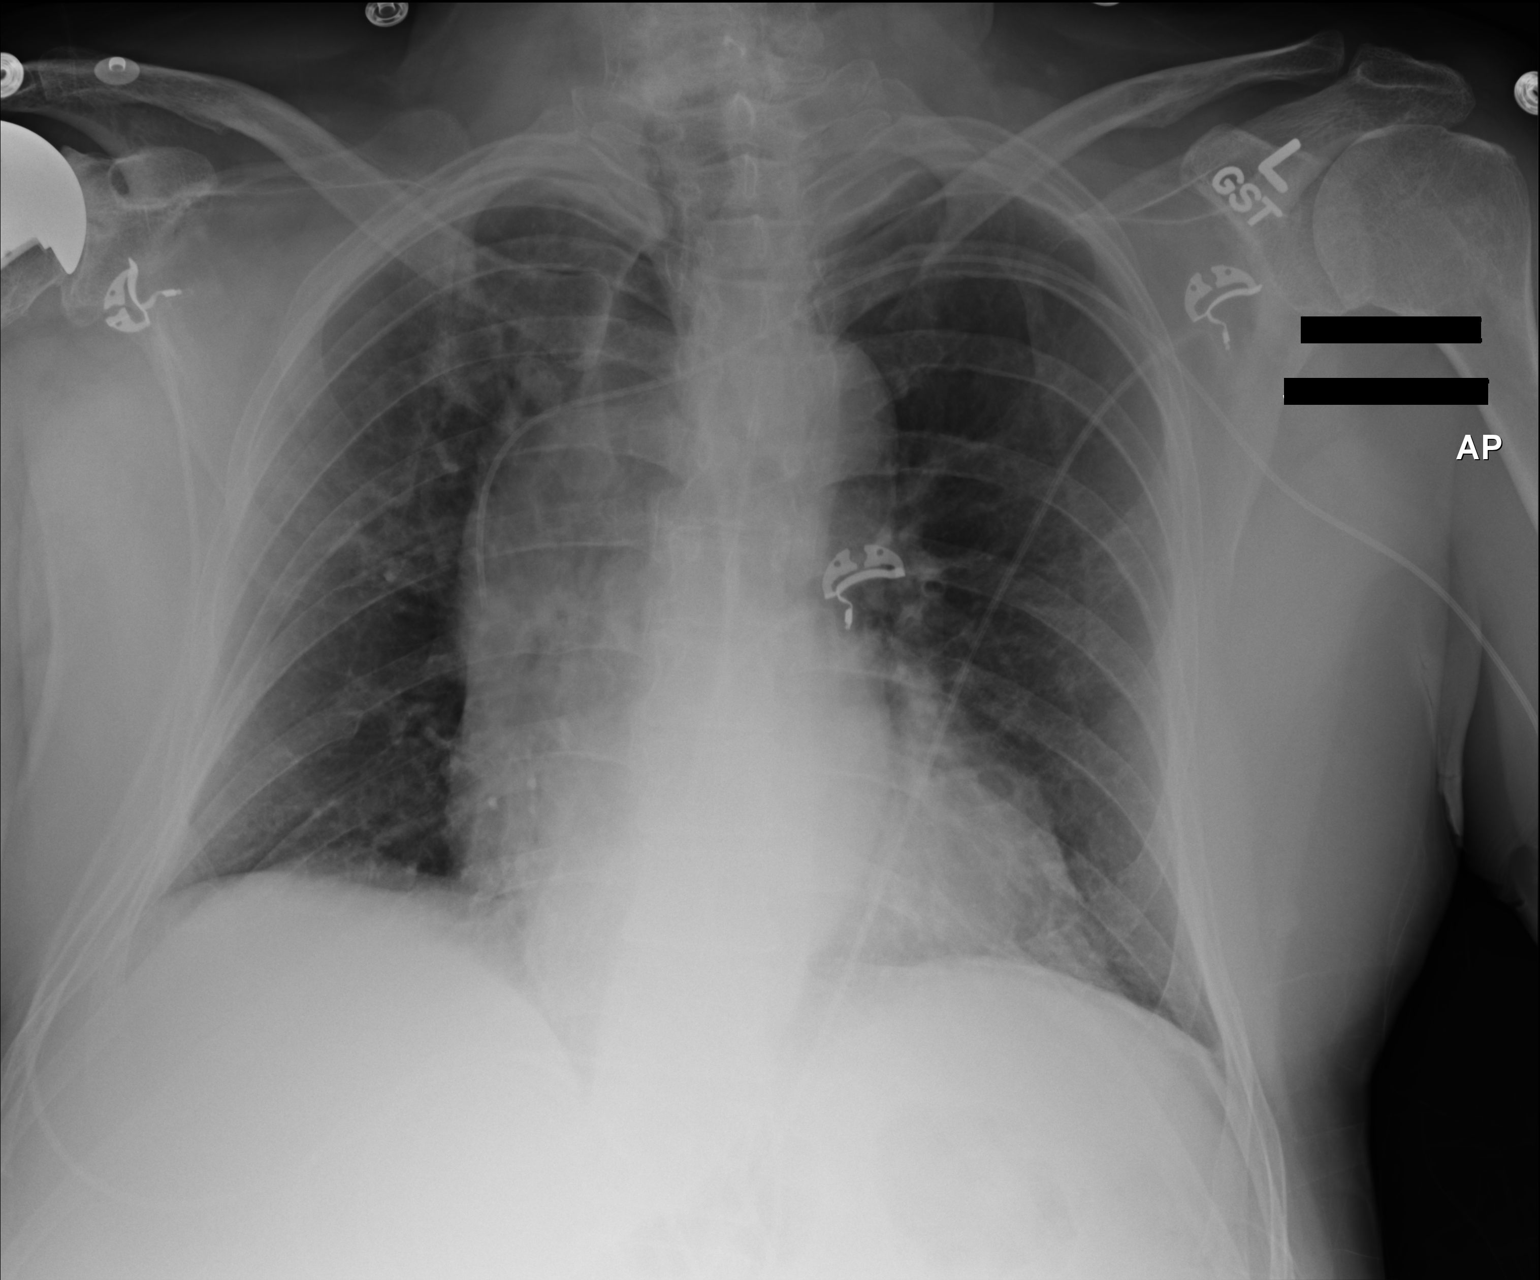

[1 of 1 positions shown; findings below may reference images not displayed]

FINDINGS: Cardiac shadow is enlarged but stable. A left-sided PICC line is now
noted with the tip at the cavoatrial junction. With patchy
infiltrative changes seen on the prior exam are again noted and
stable. No new focal abnormality is seen.
IMPRESSION: Status post PICC line placement in satisfactory position. The
remainder of the exam is stable.
